# Patient Record
Sex: Male | Born: 1945 | Race: Black or African American | Hispanic: No | Marital: Married | Smoking: Never smoker
Health system: Southern US, Community
[De-identification: ages and names within clinical notes are randomized; demographics above are authoritative.]

## PROBLEM LIST (undated history)

## (undated) DIAGNOSIS — G4733 Obstructive sleep apnea (adult) (pediatric): Secondary | ICD-10-CM

## (undated) DIAGNOSIS — M109 Gout, unspecified: Secondary | ICD-10-CM

## (undated) DIAGNOSIS — E789 Disorder of lipoprotein metabolism, unspecified: Secondary | ICD-10-CM

## (undated) DIAGNOSIS — M1A00X1 Idiopathic chronic gout, unspecified site, with tophus (tophi): Secondary | ICD-10-CM

## (undated) DIAGNOSIS — M1990 Unspecified osteoarthritis, unspecified site: Secondary | ICD-10-CM

## (undated) DIAGNOSIS — G4730 Sleep apnea, unspecified: Secondary | ICD-10-CM

## (undated) DIAGNOSIS — J45909 Unspecified asthma, uncomplicated: Secondary | ICD-10-CM

## (undated) DIAGNOSIS — N259 Disorder resulting from impaired renal tubular function, unspecified: Secondary | ICD-10-CM

## (undated) DIAGNOSIS — I639 Cerebral infarction, unspecified: Secondary | ICD-10-CM

## (undated) DIAGNOSIS — K219 Gastro-esophageal reflux disease without esophagitis: Secondary | ICD-10-CM

## (undated) DIAGNOSIS — N419 Inflammatory disease of prostate, unspecified: Secondary | ICD-10-CM

## (undated) DIAGNOSIS — I10 Essential (primary) hypertension: Secondary | ICD-10-CM

## (undated) DIAGNOSIS — E119 Type 2 diabetes mellitus without complications: Secondary | ICD-10-CM

## (undated) DIAGNOSIS — M722 Plantar fascial fibromatosis: Secondary | ICD-10-CM

## (undated) DIAGNOSIS — R350 Frequency of micturition: Secondary | ICD-10-CM

## (undated) DIAGNOSIS — E785 Hyperlipidemia, unspecified: Secondary | ICD-10-CM

## (undated) HISTORY — DX: Sleep apnea, unspecified: G47.30

## (undated) HISTORY — PX: TONSILLECTOMY: SUR1361

## (undated) HISTORY — PX: UPPER GASTROINTESTINAL ENDOSCOPY: SHX188

## (undated) HISTORY — DX: Plantar fascial fibromatosis: M72.2

## (undated) HISTORY — DX: Idiopathic chronic gout, unspecified site, with tophus (tophi): M1A.00X1

## (undated) HISTORY — DX: Frequency of micturition: R35.0

## (undated) HISTORY — PX: CYST EXCISION: SHX5701

## (undated) HISTORY — DX: Gastro-esophageal reflux disease without esophagitis: K21.9

## (undated) HISTORY — DX: Hyperlipidemia, unspecified: E78.5

## (undated) HISTORY — PX: COLONOSCOPY: SHX174

## (undated) HISTORY — PX: WRIST SURGERY: SHX841

## (undated) HISTORY — DX: Cerebral infarction, unspecified: I63.9

## (undated) HISTORY — DX: Essential (primary) hypertension: I10

## (undated) HISTORY — DX: Disorder of lipoprotein metabolism, unspecified: E78.9

## (undated) HISTORY — DX: Inflammatory disease of prostate, unspecified: N41.9

## (undated) HISTORY — DX: Unspecified osteoarthritis, unspecified site: M19.90

## (undated) HISTORY — DX: Disorder resulting from impaired renal tubular function, unspecified: N25.9

---

## 1998-08-26 ENCOUNTER — Encounter: Payer: Self-pay | Admitting: Emergency Medicine

## 1998-08-26 ENCOUNTER — Emergency Department (HOSPITAL_COMMUNITY): Admission: EM | Admit: 1998-08-26 | Discharge: 1998-08-26 | Payer: Self-pay | Admitting: Emergency Medicine

## 1999-08-18 ENCOUNTER — Inpatient Hospital Stay (HOSPITAL_COMMUNITY): Admission: EM | Admit: 1999-08-18 | Discharge: 1999-08-29 | Payer: Self-pay | Admitting: Emergency Medicine

## 1999-08-18 ENCOUNTER — Encounter: Payer: Self-pay | Admitting: Internal Medicine

## 1999-08-19 ENCOUNTER — Encounter: Payer: Self-pay | Admitting: Internal Medicine

## 1999-08-22 ENCOUNTER — Encounter: Payer: Self-pay | Admitting: *Deleted

## 1999-09-16 ENCOUNTER — Encounter: Admission: RE | Admit: 1999-09-16 | Discharge: 1999-09-26 | Payer: Self-pay | Admitting: Internal Medicine

## 2004-12-31 ENCOUNTER — Ambulatory Visit: Payer: Self-pay | Admitting: Internal Medicine

## 2005-03-06 ENCOUNTER — Ambulatory Visit: Payer: Self-pay | Admitting: Internal Medicine

## 2005-07-15 ENCOUNTER — Ambulatory Visit: Payer: Self-pay | Admitting: Internal Medicine

## 2005-07-21 ENCOUNTER — Ambulatory Visit: Payer: Self-pay | Admitting: Internal Medicine

## 2005-09-22 ENCOUNTER — Ambulatory Visit: Payer: Self-pay | Admitting: Internal Medicine

## 2005-12-04 ENCOUNTER — Ambulatory Visit: Payer: Self-pay | Admitting: Internal Medicine

## 2006-04-10 ENCOUNTER — Ambulatory Visit: Payer: Self-pay | Admitting: Internal Medicine

## 2006-06-25 ENCOUNTER — Ambulatory Visit: Payer: Self-pay | Admitting: Internal Medicine

## 2006-08-19 ENCOUNTER — Ambulatory Visit: Payer: Self-pay | Admitting: Internal Medicine

## 2006-11-02 ENCOUNTER — Ambulatory Visit: Payer: Self-pay | Admitting: Internal Medicine

## 2006-11-02 LAB — CONVERTED CEMR LAB
Albumin: 3.9 g/dL (ref 3.5–5.2)
Alkaline Phosphatase: 71 units/L (ref 39–117)
BUN: 14 mg/dL (ref 6–23)
CO2: 28 meq/L (ref 19–32)
Calcium: 9.1 mg/dL (ref 8.4–10.5)
Creatinine, Ser: 1.6 mg/dL — ABNORMAL HIGH (ref 0.4–1.5)
GFR calc non Af Amer: 47 mL/min
Glucose, Bld: 80 mg/dL (ref 70–99)
HCT: 42.7 % (ref 39.0–52.0)
Hemoglobin: 14.5 g/dL (ref 13.0–17.0)
Lymphocytes Relative: 30.4 % (ref 12.0–46.0)
MCHC: 33.9 g/dL (ref 30.0–36.0)
Monocytes Absolute: 0.8 10*3/uL — ABNORMAL HIGH (ref 0.2–0.7)
Monocytes Relative: 14.4 % — ABNORMAL HIGH (ref 3.0–11.0)
Neutro Abs: 2.9 10*3/uL (ref 1.4–7.7)
Neutrophils Relative %: 50.8 % (ref 43.0–77.0)
RDW: 12.3 % (ref 11.5–14.6)
Total Bilirubin: 0.8 mg/dL (ref 0.3–1.2)
Total Protein: 7 g/dL (ref 6.0–8.3)
Uric Acid, Serum: 9.1 mg/dL — ABNORMAL HIGH (ref 2.4–7.0)

## 2006-12-18 ENCOUNTER — Ambulatory Visit: Payer: Self-pay | Admitting: Internal Medicine

## 2006-12-29 ENCOUNTER — Ambulatory Visit: Payer: Self-pay | Admitting: Gastroenterology

## 2007-01-05 ENCOUNTER — Ambulatory Visit: Payer: Self-pay | Admitting: Gastroenterology

## 2007-02-09 ENCOUNTER — Ambulatory Visit: Payer: Self-pay | Admitting: Internal Medicine

## 2007-04-16 ENCOUNTER — Ambulatory Visit: Payer: Self-pay | Admitting: Internal Medicine

## 2007-06-01 ENCOUNTER — Ambulatory Visit: Payer: Self-pay | Admitting: Internal Medicine

## 2007-07-26 DIAGNOSIS — N259 Disorder resulting from impaired renal tubular function, unspecified: Secondary | ICD-10-CM

## 2007-07-26 DIAGNOSIS — M109 Gout, unspecified: Secondary | ICD-10-CM

## 2007-07-26 DIAGNOSIS — I10 Essential (primary) hypertension: Secondary | ICD-10-CM

## 2007-07-26 HISTORY — DX: Essential (primary) hypertension: I10

## 2007-07-26 HISTORY — DX: Disorder resulting from impaired renal tubular function, unspecified: N25.9

## 2007-07-26 HISTORY — DX: Gout, unspecified: M10.9

## 2007-07-27 ENCOUNTER — Telehealth: Payer: Self-pay | Admitting: Internal Medicine

## 2007-08-06 ENCOUNTER — Ambulatory Visit: Payer: Self-pay | Admitting: Internal Medicine

## 2007-08-06 LAB — CONVERTED CEMR LAB
Calcium: 9 mg/dL (ref 8.4–10.5)
Chloride: 109 meq/L (ref 96–112)
Creatinine, Ser: 1.7 mg/dL — ABNORMAL HIGH (ref 0.4–1.5)
GFR calc non Af Amer: 44 mL/min
Uric Acid, Serum: 10.4 mg/dL — ABNORMAL HIGH (ref 2.4–7.0)

## 2007-09-28 ENCOUNTER — Ambulatory Visit: Payer: Self-pay | Admitting: Internal Medicine

## 2007-09-28 DIAGNOSIS — M1990 Unspecified osteoarthritis, unspecified site: Secondary | ICD-10-CM

## 2007-09-28 HISTORY — DX: Unspecified osteoarthritis, unspecified site: M19.90

## 2007-09-28 LAB — CONVERTED CEMR LAB
BUN: 18 mg/dL (ref 6–23)
Calcium: 9.4 mg/dL (ref 8.4–10.5)
Chloride: 107 meq/L (ref 96–112)
Creatinine, Ser: 1.6 mg/dL — ABNORMAL HIGH (ref 0.4–1.5)
GFR calc non Af Amer: 47 mL/min
Uric Acid, Serum: 9.5 mg/dL — ABNORMAL HIGH (ref 2.4–7.0)

## 2007-10-19 ENCOUNTER — Telehealth: Payer: Self-pay | Admitting: Internal Medicine

## 2008-03-06 ENCOUNTER — Telehealth: Payer: Self-pay | Admitting: Internal Medicine

## 2008-03-07 ENCOUNTER — Telehealth: Payer: Self-pay | Admitting: Internal Medicine

## 2008-03-10 ENCOUNTER — Ambulatory Visit: Payer: Self-pay | Admitting: Internal Medicine

## 2008-03-10 DIAGNOSIS — R350 Frequency of micturition: Secondary | ICD-10-CM

## 2008-03-10 HISTORY — DX: Frequency of micturition: R35.0

## 2008-03-10 LAB — CONVERTED CEMR LAB
CO2: 24 meq/L (ref 19–32)
Chloride: 105 meq/L (ref 96–112)
Creatinine, Ser: 1.51 mg/dL — ABNORMAL HIGH (ref 0.40–1.50)
Potassium: 4 meq/L (ref 3.5–5.3)
Sodium: 141 meq/L (ref 135–145)

## 2008-03-16 ENCOUNTER — Ambulatory Visit: Payer: Self-pay | Admitting: Internal Medicine

## 2008-03-16 ENCOUNTER — Telehealth (INDEPENDENT_AMBULATORY_CARE_PROVIDER_SITE_OTHER): Payer: Self-pay | Admitting: *Deleted

## 2008-04-04 ENCOUNTER — Telehealth: Payer: Self-pay | Admitting: Internal Medicine

## 2008-04-18 ENCOUNTER — Ambulatory Visit: Payer: Self-pay | Admitting: Internal Medicine

## 2008-04-18 LAB — CONVERTED CEMR LAB: Uric Acid, Serum: 10.1 mg/dL — ABNORMAL HIGH (ref 4.0–7.8)

## 2008-10-02 ENCOUNTER — Ambulatory Visit: Payer: Self-pay | Admitting: Internal Medicine

## 2008-10-02 LAB — CONVERTED CEMR LAB
CO2: 27 meq/L (ref 19–32)
Calcium: 9 mg/dL (ref 8.4–10.5)
Chloride: 108 meq/L (ref 96–112)
Potassium: 4.1 meq/L (ref 3.5–5.1)
Sodium: 143 meq/L (ref 135–145)
Uric Acid, Serum: 10.4 mg/dL — ABNORMAL HIGH (ref 4.0–7.8)

## 2008-11-02 ENCOUNTER — Ambulatory Visit: Payer: Self-pay | Admitting: Internal Medicine

## 2009-02-19 ENCOUNTER — Ambulatory Visit: Payer: Self-pay | Admitting: Internal Medicine

## 2009-02-19 LAB — CONVERTED CEMR LAB
CO2: 27 meq/L (ref 19–32)
Calcium: 9.2 mg/dL (ref 8.4–10.5)
Potassium: 4.1 meq/L (ref 3.5–5.1)
Sodium: 141 meq/L (ref 135–145)
Uric Acid, Serum: 9.6 mg/dL — ABNORMAL HIGH (ref 4.0–7.8)

## 2009-03-02 ENCOUNTER — Telehealth: Payer: Self-pay | Admitting: Internal Medicine

## 2009-03-14 ENCOUNTER — Telehealth: Payer: Self-pay | Admitting: Internal Medicine

## 2009-03-21 ENCOUNTER — Telehealth: Payer: Self-pay | Admitting: Internal Medicine

## 2009-03-28 ENCOUNTER — Ambulatory Visit: Payer: Self-pay | Admitting: Internal Medicine

## 2009-03-28 LAB — CONVERTED CEMR LAB
CO2: 27 meq/L (ref 19–32)
Calcium: 9 mg/dL (ref 8.4–10.5)
GFR calc non Af Amer: 46.67 mL/min (ref >60)
Potassium: 4.3 meq/L (ref 3.5–5.1)
Sodium: 141 meq/L (ref 135–145)

## 2009-05-01 ENCOUNTER — Ambulatory Visit: Payer: Self-pay | Admitting: Internal Medicine

## 2009-11-07 ENCOUNTER — Telehealth (INDEPENDENT_AMBULATORY_CARE_PROVIDER_SITE_OTHER): Payer: Self-pay | Admitting: *Deleted

## 2009-11-08 ENCOUNTER — Ambulatory Visit: Payer: Self-pay | Admitting: Internal Medicine

## 2009-11-08 DIAGNOSIS — E789 Disorder of lipoprotein metabolism, unspecified: Secondary | ICD-10-CM | POA: Insufficient documentation

## 2009-11-08 DIAGNOSIS — M1A00X1 Idiopathic chronic gout, unspecified site, with tophus (tophi): Secondary | ICD-10-CM

## 2009-11-08 DIAGNOSIS — N419 Inflammatory disease of prostate, unspecified: Secondary | ICD-10-CM

## 2009-11-08 HISTORY — DX: Idiopathic chronic gout, unspecified site, with tophus (tophi): M1A.00X1

## 2009-11-08 HISTORY — DX: Disorder of lipoprotein metabolism, unspecified: E78.9

## 2009-11-08 HISTORY — DX: Inflammatory disease of prostate, unspecified: N41.9

## 2009-11-08 LAB — CONVERTED CEMR LAB
Chloride: 106 meq/L (ref 96–112)
Cholesterol: 184 mg/dL (ref 0–200)
GFR calc non Af Amer: 49.57 mL/min (ref >60)
Glucose, Bld: 109 mg/dL — ABNORMAL HIGH (ref 70–99)
Potassium: 4.3 meq/L (ref 3.5–5.1)
Sodium: 143 meq/L (ref 135–145)
Triglycerides: 142 mg/dL (ref 0.0–149.0)
Uric Acid, Serum: 11.4 mg/dL — ABNORMAL HIGH (ref 4.0–7.8)

## 2009-11-21 LAB — CONVERTED CEMR LAB: PSA, Free Pct: 34 (ref >25)

## 2010-01-16 ENCOUNTER — Ambulatory Visit: Payer: Self-pay | Admitting: Internal Medicine

## 2010-01-16 DIAGNOSIS — E785 Hyperlipidemia, unspecified: Secondary | ICD-10-CM

## 2010-01-16 HISTORY — DX: Hyperlipidemia, unspecified: E78.5

## 2010-01-16 LAB — CONVERTED CEMR LAB
Cholesterol, target level: 200 mg/dL
HDL goal, serum: 40 mg/dL

## 2010-04-05 ENCOUNTER — Ambulatory Visit: Payer: Self-pay | Admitting: Internal Medicine

## 2010-04-05 LAB — CONVERTED CEMR LAB
Calcium: 9.1 mg/dL (ref 8.4–10.5)
GFR calc non Af Amer: 39.27 mL/min (ref >60)
PSA: 1.17 ng/mL (ref 0.10–4.00)
Sodium: 144 meq/L (ref 135–145)
Uric Acid, Serum: 11.4 mg/dL — ABNORMAL HIGH (ref 4.0–7.8)

## 2010-04-19 ENCOUNTER — Ambulatory Visit: Payer: Self-pay | Admitting: Internal Medicine

## 2010-04-19 DIAGNOSIS — M722 Plantar fascial fibromatosis: Secondary | ICD-10-CM

## 2010-04-19 HISTORY — DX: Plantar fascial fibromatosis: M72.2

## 2010-09-18 ENCOUNTER — Ambulatory Visit: Payer: Self-pay | Admitting: Family Medicine

## 2010-10-15 ENCOUNTER — Encounter: Payer: Self-pay | Admitting: Internal Medicine

## 2010-12-11 ENCOUNTER — Telehealth: Payer: Self-pay | Admitting: Internal Medicine

## 2011-01-21 NOTE — Assessment & Plan Note (Signed)
Summary: fup//ccm/pt rsc/cjr   Vital Signs:  Patient profile:   65 year old male Height:      70 inches Weight:      239 pounds BMI:     34.42 Temp:     98.7 degrees F oral Pulse rate:   76 / minute Resp:     14 per minute BP sitting:   130 / 82  (left arm)  Vitals Entered By: Allyne Gee, LPN (January 26, 624THL 9:28 AM)  Nutrition Counseling: Patient's BMI is greater than 25 and therefore counseled on weight management options. CC: roa, Hypertension Management, Lipid Management   CC:  roa, Hypertension Management, and Lipid Management.  History of Present Illness: The pt had two smaller attacks of gout follow up blood pressure, recheck medications still having sexual functioning issues and the cialis 20 worked best discusson of diet and gout GERD with reflux has been stable on the PPI  Hypertension History:      He denies headache, chest pain, palpitations, dyspnea with exertion, orthopnea, PND, peripheral edema, visual symptoms, neurologic problems, syncope, and side effects from treatment.        Positive major cardiovascular risk factors include male age 50 years old or older, hyperlipidemia, and hypertension.  Negative major cardiovascular risk factors include no history of diabetes and non-tobacco-user status.        Positive history for target organ damage include renal insufficiency.  Further assessment for target organ damage reveals no history of ASHD, cardiac end-organ damage (CHF/LVH), stroke/TIA, or peripheral vascular disease.    Lipid Management History:      Positive NCEP/ATP III risk factors include male age 73 years old or older, HDL cholesterol less than 40, and hypertension.  Negative NCEP/ATP III risk factors include non-diabetic, non-tobacco-user status, no ASHD (atherosclerotic heart disease), no prior stroke/TIA, no peripheral vascular disease, and no history of aortic aneurysm.      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 1 Three Times A Day 2)  Labetalol Hcl 200 Mg Tabs (Labetalol Hcl) .... Two Times A Day 3)  Pantoprazole Sodium 40 Mg Tbec (Pantoprazole Sodium) .... Once Daily 4)  Valium 5 Mg  Tabs (Diazepam) .... Two Times A Day 5)  Levitra 10 Mg  Tabs (Vardenafil Hcl) .... Take 1 Tablet By Mouth Once A Day 6)  Azor 10-40 Mg  Tabs (Amlodipine-Olmesartan) .... One Daily 7)  Lisinopril 40 Mg Tabs (Lisinopril) .Marland Kitchen.. 1 By Mouth Once Daily 8)  Cialis 5 Mg Tabs (Tadalafil) .... One By Mouth Daily 9)  Colchicine 0.6 Mg Tabs (Colchicine) .Marland Kitchen.. 1 Two Times A Day As Needed Gout Flare Up 10)  Allopurinol 300 Mg Tabs (Allopurinol) .... One Every Bedtime. 11)  Tricor 145 Mg Tabs (Fenofibrate) .... One By Mouth Daily  Current Medications (verified): 1)  Indomethacin 50 Mg Caps (Indomethacin) .Marland Kitchen.. 1 Three Times A Day 2)  Labetalol Hcl 200 Mg Tabs (Labetalol Hcl) .... Two Times A Day 3)  Pantoprazole Sodium 40 Mg Tbec (Pantoprazole Sodium) .... Once Daily 4)  Valium 5 Mg  Tabs (Diazepam) .... Two Times A Day 5)  Levitra 10 Mg  Tabs (Vardenafil Hcl) .... Take 1 Tablet By Mouth Once A Day 6)  Azor 10-40 Mg  Tabs (Amlodipine-Olmesartan) .... One Daily 7)  Lisinopril 40 Mg Tabs (Lisinopril) .Marland Kitchen.. 1 By Mouth Once Daily 8)  Cialis 5 Mg Tabs (Tadalafil) .... One By Mouth Daily 9)  Colchicine 0.6 Mg Tabs (Colchicine) .Marland Kitchen.. 1 Two Times A Day As Needed Gout Flare Up 10)  Allopurinol 300 Mg Tabs (Allopurinol) .... One Every Bedtime. 11)  Tricor 145 Mg Tabs (Fenofibrate) .... One By  Mouth Daily  Allergies (verified): 1)  ! Uloric (Febuxostat)  Past History:  Social History: Last updated: 07/26/2007 Never Smoked Married Alcohol use-no  Risk Factors: Smoking Status: never (01/16/2010)  Past medical, surgical, family and social histories (including risk factors) reviewed, and no changes noted (except as noted below).  Past Medical History: Reviewed history from 09/28/2007 and no changes required. Gout Hypertension Back pain Renal insufficiency Osteoarthritis  Past Surgical History: Reviewed history from 08/06/2007 and no changes required. EDG-01/05/2007 Had surgery to right arm for gout  Family History: Reviewed history and no changes required.  Social History: Reviewed history from 07/26/2007 and no changes required. Never Smoked Married Alcohol use-no  Review of Systems  The patient denies anorexia, fever, weight loss, weight gain, vision loss, decreased hearing, hoarseness, chest pain, syncope, dyspnea on exertion, peripheral edema, prolonged cough, headaches, hemoptysis, abdominal pain, melena, hematochezia, severe indigestion/heartburn, hematuria, incontinence, genital sores, muscle weakness, suspicious skin lesions, transient blindness, difficulty walking, depression, unusual weight change, abnormal bleeding, enlarged lymph nodes, angioedema, and breast masses.    Physical Exam  General:  Well-developed,well-nourished,in no acute distress; alert,appropriate and cooperative throughout examination Head:  normocephalic and atraumatic.   Eyes:  pupils equal and pupils round.   Ears:  R ear normal and L ear normal.   Nose:  no external deformity and no nasal discharge.   Neck:  supple and no masses.   Lungs:  normal respiratory effort.   Heart:  normal rate and regular rhythm.   Abdomen:  soft and non-tender.   Msk:  joint tenderness and joint swelling.   Extremities:  trace left pedal edema and trace right pedal edema.   Neurologic:  alert  & oriented X3 and gait normal.     Impression & Recommendations:  Problem # 1:  RENAL INSUFFICIENCY (ICD-588.9) due to htn and gout creat 1.8  Problem # 2:  CHRONIC GOUTY ARTHROPATHY WITH TOPHUS (ICD-274.03) monitering uric acid levels, could not tolerate uloric His updated medication list for this problem includes:    Colchicine 0.6 Mg Tabs (Colchicine) .Marland Kitchen... 1 two times a day as needed gout flare up    Allopurinol 300 Mg Tabs (Allopurinol) ..... One every bedtime.  Elevate extremity; warm compresses, symptomatic relief and medication as directed.   Problem # 3:  HYPERTENSION (ICD-401.9)  His updated medication list for this problem includes:    Labetalol Hcl 200 Mg Tabs (Labetalol hcl) .Marland Kitchen..Marland Kitchen Two times a day    Azor 10-40 Mg Tabs (Amlodipine-olmesartan) ..... One daily    Lisinopril 40 Mg Tabs (Lisinopril) .Marland Kitchen... 1 by mouth once daily  BP today: 130/82 Prior BP: 170/90 (11/08/2009)  10 Yr Risk Heart Disease: 22 % Prior 10 Yr Risk Heart Disease: Not enough information (09/28/2007)  Labs Reviewed: K+: 4.3 (11/08/2009) Creat: : 1.8 (11/08/2009)   Chol: 184 (11/08/2009)   HDL: 30.50 (11/08/2009)   LDL: 125 (11/08/2009)   TG: 142.0 (  11/08/2009)  Problem # 4:  HYPERCHOLESTEROLEMIA, BORDERLINE (ICD-272.4) the tricor works well and the pt could not tolerate statins His updated medication list for this problem includes:    Tricor 145 Mg Tabs (Fenofibrate) ..... One by mouth daily  Labs Reviewed: SGOT: 46 (11/02/2006)   SGPT: 60 (11/02/2006)  Lipid Goals: Chol Goal: 200 (01/16/2010)   HDL Goal: 40 (01/16/2010)   LDL Goal: 100 (01/16/2010)   TG Goal: 150 (01/16/2010)  10 Yr Risk Heart Disease: 22 % Prior 10 Yr Risk Heart Disease: Not enough information (09/28/2007)   HDL:30.50 (11/08/2009)  LDL:125 (11/08/2009)  Chol:184 (11/08/2009)  Trig:142.0 (11/08/2009)  Complete Medication List: 1)  Indomethacin 50 Mg Caps (Indomethacin) .Marland Kitchen.. 1 three times a day 2)  Labetalol Hcl 200 Mg  Tabs (Labetalol hcl) .... Two times a day 3)  Pantoprazole Sodium 40 Mg Tbec (Pantoprazole sodium) .... Once daily 4)  Valium 5 Mg Tabs (Diazepam) .... Two times a day 5)  Levitra 10 Mg Tabs (Vardenafil hcl) .... Take 1 tablet by mouth once a day 6)  Azor 10-40 Mg Tabs (Amlodipine-olmesartan) .... One daily 7)  Lisinopril 40 Mg Tabs (Lisinopril) .Marland Kitchen.. 1 by mouth once daily 8)  Cialis 5 Mg Tabs (Tadalafil) .... One by mouth daily 9)  Colchicine 0.6 Mg Tabs (Colchicine) .Marland Kitchen.. 1 two times a day as needed gout flare up 10)  Allopurinol 300 Mg Tabs (Allopurinol) .... One every bedtime. 11)  Tricor 145 Mg Tabs (Fenofibrate) .... One by mouth daily  Hypertension Assessment/Plan:      The patient's hypertensive risk group is category C: Target organ damage and/or diabetes.  His calculated 10 year risk of coronary heart disease is 22 %.  Today's blood pressure is 130/82.  His blood pressure goal is < 140/90.  Lipid Assessment/Plan:      Based on NCEP/ATP III, the patient's risk factor category is "2 or more risk factors and a calculated 10 year CAD risk of > 20%".  The patient's lipid goals are as follows: Total cholesterol goal is 200; LDL cholesterol goal is 100; HDL cholesterol goal is 40; Triglyceride goal is 150.  His LDL cholesterol goal has been met.    Patient Instructions: 1)  Please schedule a follow-up appointment in 3 months. 2)  BMP prior to visit, ICD-9:  abd uric acid274.9 3)  PSA 601.0

## 2011-01-21 NOTE — Assessment & Plan Note (Signed)
Summary: 3 month rov/njr   Vital Signs:  Patient profile:   65 year old male Height:      70 inches Weight:      244 pounds BMI:     35.14 Temp:     99.2 degrees F oral Pulse rate:   80 / minute Resp:     14 per minute BP sitting:   140 / 90  Vitals Entered By: Allyne Gee, LPN (April 29, 624THL 624THL PM) CC: roa, Hypertension Management   CC:  roa and Hypertension Management.  History of Present Illness: pt has episodic gout anfd now present with severe plantar fascitis of his feet   Hypertension History:      He denies headache, chest pain, palpitations, dyspnea with exertion, orthopnea, PND, peripheral edema, visual symptoms, neurologic problems, syncope, and side effects from treatment.        Positive major cardiovascular risk factors include male age 78 years old or older, hyperlipidemia, and hypertension.  Negative major cardiovascular risk factors include no history of diabetes and non-tobacco-user status.        Positive history for target organ damage include renal insufficiency.  Further assessment for target organ damage reveals no history of ASHD, cardiac end-organ damage (CHF/LVH), stroke/TIA, or peripheral vascular disease.     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 1 Three Times A Day 2)  Labetalol Hcl 200 Mg Tabs (Labetalol Hcl) .... Two Times A Day 3)  Pantoprazole Sodium 40 Mg Tbec (Pantoprazole Sodium) .... Once Daily 4)  Valium 5 Mg  Tabs (Diazepam) .... Two Times A Day 5)  Levitra 10 Mg  Tabs (Vardenafil Hcl) .... Take 1 Tablet By Mouth Once A Day 6)  Azor 10-40 Mg  Tabs (Amlodipine-Olmesartan) .... One Daily 7)  Lisinopril 40 Mg Tabs (Lisinopril) .Marland Kitchen.. 1 By Mouth Once Daily 8)  Cialis 5 Mg Tabs (Tadalafil) .... One By Mouth Daily 9)  Colchicine 0.6 Mg Tabs (Colchicine) .Marland Kitchen.. 1 Two Times A Day As Needed Gout Flare Up 10)  Allopurinol 300 Mg Tabs (Allopurinol) .... One Every Bedtime. 11)  Tricor 145 Mg Tabs (Fenofibrate) .... One By Mouth Daily  Current Medications (verified): 1)  Indomethacin 50 Mg Caps (Indomethacin) .Marland Kitchen.. 1 Three Times A Day 2)  Labetalol Hcl 200 Mg Tabs (Labetalol Hcl) .... Two Times A Day 3)  Pantoprazole Sodium 40 Mg Tbec (Pantoprazole Sodium) .... Once Daily 4)  Valium 5 Mg  Tabs (Diazepam) .... Two Times A Day 5)  Levitra 10 Mg  Tabs (Vardenafil Hcl) .... Take 1 Tablet By Mouth Once A Day 6)  Azor 10-40 Mg  Tabs (Amlodipine-Olmesartan) .... One Daily 7)  Lisinopril 40 Mg Tabs (Lisinopril) .Marland Kitchen.. 1 By Mouth Once Daily 8)  Cialis 5 Mg Tabs (Tadalafil) .... One By Mouth Daily 9)  Colchicine 0.6 Mg Tabs (Colchicine) .Marland Kitchen.. 1 Two Times A Day As Needed Gout Flare Up 10)  Allopurinol 300 Mg Tabs (Allopurinol) .... One Every Bedtime. 11)  Tricor 145 Mg Tabs (Fenofibrate) .... One By Mouth Daily  Allergies (verified): 1)  ! Uloric (Febuxostat)  Past History:  Social History: Last updated: 07/26/2007 Never Smoked Married Alcohol use-no  Risk Factors: Smoking Status: never (01/16/2010)  Past medical, surgical, family and social histories (including risk factors) reviewed, and no  changes noted (except as noted below).  Past Medical History: Reviewed history from 09/28/2007 and no changes required. Gout Hypertension Back pain Renal insufficiency Osteoarthritis  Past Surgical History: Reviewed history from 08/06/2007 and no changes required. EDG-01/05/2007 Had surgery to right arm for gout  Family History: Reviewed history and no changes required.  Social History: Reviewed history from 07/26/2007 and no changes required. Never Smoked Married Alcohol use-no  Review of Systems  The patient denies anorexia, fever, weight loss, weight gain, vision loss, decreased hearing, hoarseness, chest pain, syncope, dyspnea on exertion, peripheral edema, prolonged cough, headaches, hemoptysis, abdominal pain, melena, hematochezia, severe indigestion/heartburn, hematuria, incontinence, genital sores, muscle weakness, suspicious skin lesions, transient blindness, difficulty walking, depression, unusual weight change, abnormal bleeding, enlarged lymph nodes, angioedema, and breast masses.    Physical Exam  General:  Well-developed,well-nourished,in no acute distress; alert,appropriate and cooperative throughout examination Head:  normocephalic and atraumatic.   Eyes:  pupils equal and pupils round.   Ears:  R ear normal and L ear normal.   Nose:  no external deformity and no nasal discharge.   Neck:  supple and no masses.   Lungs:  normal respiratory effort.   Heart:  normal rate and regular rhythm.   Abdomen:  soft and non-tender.   Msk:  joint tenderness and joint swelling.   Pulses:  R and L carotid,radial,femoral,dorsalis pedis and posterior tibial pulses are full and equal bilaterally Extremities:  trace left pedal edema and trace right pedal edema.   Neurologic:  alert & oriented X3 and gait normal.     Impression & Recommendations:  Problem # 1:  CHRONIC GOUTY ARTHROPATHY WITH TOPHUS (ICD-274.03) failedthe uloric His updated medication list for this problem  includes:    Colchicine 0.6 Mg Tabs (Colchicine) .Marland Kitchen... 1 two times a day as needed gout flare up    Allopurinol 300 Mg Tabs (Allopurinol) ..... One every bedtime.  Elevate extremity; warm compresses, symptomatic relief and medication as directed.   Problem # 2:  PLANTAR FASCIITIS, BILATERAL (ICD-728.71)  His updated medication list for this problem includes:    Indomethacin 50 Mg Caps (Indomethacin) .Marland Kitchen... 1 three times a day  Discussed use of gel inserts, ice massage, and stretching exercises.   Problem # 3:  HYPERTENSION (ICD-401.9)  His updated medication list for this problem includes:    Labetalol Hcl 200 Mg Tabs (Labetalol hcl) .Marland Kitchen..Marland Kitchen Two times a day    Azor 10-40 Mg Tabs (Amlodipine-olmesartan) ..... One daily    Lisinopril 40 Mg Tabs (Lisinopril) .Marland Kitchen... 1 by mouth once daily  BP today: 140/90 Prior BP: 130/82 (01/16/2010)  10 Yr Risk Heart Disease: 27 % Prior 10 Yr Risk Heart Disease: 22 % (01/16/2010)  Labs Reviewed: K+: 4.3 (04/05/2010) Creat: : 2.2 (04/05/2010)   Chol: 184 (11/08/2009)   HDL: 30.50 (11/08/2009)   LDL: 125 (11/08/2009)   TG: 142.0 (11/08/2009)  Problem # 4:  RENAL INSUFFICIENCY (ICD-588.9) moniter B met  Complete Medication List: 1)  Indomethacin 50 Mg Caps (Indomethacin) .Marland Kitchen.. 1 three times a day 2)  Labetalol Hcl 200 Mg Tabs (Labetalol hcl) .... Two times a day 3)  Pantoprazole Sodium 40 Mg Tbec (Pantoprazole sodium) .... Once daily 4)  Valium 5 Mg Tabs (Diazepam) .... Two times a day 5)  Levitra 10 Mg Tabs (Vardenafil hcl) .... Take 1 tablet by mouth once a day 6)  Azor 10-40 Mg Tabs (Amlodipine-olmesartan) .... One daily 7)  Lisinopril 40 Mg Tabs (Lisinopril) .Marland Kitchen.. 1 by mouth once daily 8)  Cialis 5 Mg Tabs (Tadalafil) .... One by mouth daily 9)  Colchicine 0.6 Mg Tabs (Colchicine) .Marland Kitchen.. 1 two times a day as needed gout flare up 10)  Allopurinol 300 Mg Tabs (Allopurinol) .... One every bedtime. 11)  Tricor 145 Mg Tabs (Fenofibrate) .... One by  mouth daily 12)  Methylprednisolone (pak) 4 Mg Tabs (Methylprednisolone) .... Take as directed 5 day pack  Hypertension Assessment/Plan:      The patient's hypertensive risk group is category C: Target organ damage and/or diabetes.  His calculated 10 year risk of coronary heart disease is 27 %.  Today's blood pressure is 140/90.  His blood pressure goal is < 140/90.  Patient Instructions: 1)  Please schedule a follow-up appointment in 3 months. Prescriptions: METHYLPREDNISOLONE (PAK) 4 MG TABS (METHYLPREDNISOLONE) take as directed 5 day pack  #1 pack x 0   Entered and Authorized by:   Ricard Dillon MD   Signed by:   Ricard Dillon MD on 04/19/2010   Method used:   Electronically to        [facility]. [phone]* (retail)       [address]       [place], [place]  [address]       Ph: [phone] or [phone]       Fax: [phone]   RxID:   [id number]

## 2011-01-21 NOTE — Consult Note (Signed)
[facility]   Imported By: Laural Benes 10/21/2010 10:25:39  _____________________________________________________________________  External Attachment:    Type:   Image     Comment:   External Document

## 2011-01-21 NOTE — Assessment & Plan Note (Signed)
Summary: DIZZINESS//SLM   Vital Signs:  Patient profile:   65 year old male Weight:      235 pounds Temp:     98.3 degrees F oral BP sitting:   160 / 98  (left arm) Cuff size:   large  Vitals Entered By: Nira Conn LPN (September 28, 624THL 9:46 AM)  History of Present Illness: Patient seen acute visit chief complaint of dizziness.  Onset 2 days ago. First noticed when getting out of bed and moving to the right side. Dizziness is vertigo moderate to severe at times. Slight nausea but no vomiting. Somewhat better throughout the day. No hearing changes, headaches, nasal congestion, focal weakness, or speech changes. Symptoms exacerbated with movement to the right. History of similar episode about 5 years ago.  History of hypertension treated with 3 drug regimen. Compliant with all medications.  Allergies: 1)  ! Uloric (Febuxostat)  Past History:  Past Medical History: Last updated: 09/28/2007 Gout Hypertension Back pain Renal insufficiency Osteoarthritis PMH reviewed for relevance  Review of Systems  The patient denies anorexia, fever, weight loss, chest pain, and headaches.    Physical Exam  General:  Well-developed,well-nourished,in no acute distress; alert,appropriate and cooperative throughout examination Head:  Normocephalic and atraumatic without obvious abnormalities. No apparent alopecia or balding. Eyes:  pupils equal, pupils round, and pupils reactive to light.   Ears:  External ear exam shows no significant lesions or deformities.  Otoscopic examination reveals clear canals, tympanic membranes are intact bilaterally without bulging, retraction, inflammation or discharge. Hearing is grossly normal bilaterally. Nose:  External nasal examination shows no deformity or inflammation. Nasal mucosa are pink and moist without lesions or exudates. Mouth:  Oral mucosa and oropharynx without lesions or exudates.  Teeth in good repair. Neck:  No deformities, masses, or  tenderness noted. Lungs:  Normal respiratory effort, chest expands symmetrically. Lungs are clear to auscultation, no crackles or wheezes. Heart:  normal rate and regular rhythm.   Neurologic:  patient has no nystagmus. He has vertigo symptoms which are triggered with movement of head to the right side but not the leftalert & oriented X3, cranial nerves II-XII intact, strength normal in all extremities, sensation intact to light touch, and finger-to-nose normal.   Cervical Nodes:  No lymphadenopathy noted Psych:  normally interactive, good eye contact, not anxious appearing, and not depressed appearing.     Impression & Recommendations:  Problem # 1:  BENIGN POSITIONAL VERTIGO (ICD-386.11) Assessment New meclizine for symptom relief.  Consider referral for vestibular rrehab if symptoms persist. His updated medication list for this problem includes:    Meclizine Hcl 25 Mg Tabs (Meclizine hcl) ..... One by mouth q 8 hours as needed vertigo  Problem # 2:  HYPERTENSION (ICD-401.9) pt will monitor. His updated medication list for this problem includes:    Labetalol Hcl 200 Mg Tabs (Labetalol hcl) .Marland Kitchen..Marland Kitchen Two times a day    Azor 10-40 Mg Tabs (Amlodipine-olmesartan) ..... One daily    Lisinopril 40 Mg Tabs (Lisinopril) .Marland Kitchen... 1 by mouth once daily  Complete Medication List: 1)  Indomethacin 50 Mg Caps (Indomethacin) .Marland Kitchen.. 1 three times a day 2)  Labetalol Hcl 200 Mg Tabs (Labetalol hcl) .... Two times a day 3)  Pantoprazole Sodium 40 Mg Tbec (Pantoprazole sodium) .... Once daily 4)  Valium 5 Mg Tabs (Diazepam) .... Two times a day 5)  Levitra 10 Mg Tabs (Vardenafil hcl) .... Take 1 tablet by mouth once a day 6)  Azor 10-40 Mg Tabs (Amlodipine-olmesartan) .Marland KitchenMarland KitchenMarland Kitchen  One daily 7)  Lisinopril 40 Mg Tabs (Lisinopril) .Marland Kitchen.. 1 by mouth once daily 8)  Cialis 5 Mg Tabs (Tadalafil) .... One by mouth daily 9)  Colchicine 0.6 Mg Tabs (Colchicine) .Marland Kitchen.. 1 two times a day as needed gout flare up 10)  Allopurinol  300 Mg Tabs (Allopurinol) .... One every bedtime. 11)  Tricor 145 Mg Tabs (Fenofibrate) .... One by mouth daily 12)  Methylprednisolone (pak) 4 Mg Tabs (Methylprednisolone) .... Take as directed 5 day pack 13)  Meclizine Hcl 25 Mg Tabs (Meclizine hcl) .... One by mouth q 8 hours as needed vertigo  Patient Instructions: 1)  Be in touch if symptoms not clearing over the next 1-2 weeks. 2)  Avoid driving until symptoms further cleared. Prescriptions: MECLIZINE HCL 25 MG TABS (MECLIZINE HCL) one by mouth q 8 hours as needed vertigo  #30 x 0   Entered and Authorized by:   Carolann Littler MD   Signed by:   Carolann Littler MD on 09/18/2010   Method used:   Electronically to        [facility]. [phone]* (retail)       [address]       [place], [place]  [address]       Ph: [phone] or [phone]       Fax: [phone]   RxID:   [id number]

## 2011-01-21 NOTE — Letter (Signed)
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         Sincerely,    Allyne Gee, LPN

## 2011-01-23 NOTE — Progress Notes (Signed)
  Phone Note Call from Patient   Caller: Patient Call For: Ricard Dillon MD Summary of Call: Pt states both of knees are painful and swelling and cannot walk or drive. [facility] Initial call taken by: Deanna Artis CMA AAMA,  December 11, 2010 2:37 PM  Follow-up for Phone Call        per dr Arnoldo Morale- may have prednisone 10 mg 12 day dose pack Follow-up by: Allyne Gee, LPN,  December 21, 624THL 3:17 PM    New/Updated Medications: PREDNISONE (PAK) 10 MG TABS (PREDNISONE) as directed Prescriptions: PREDNISONE (PAK) 10 MG TABS (PREDNISONE) as directed  #1 pack x 0   Entered by:   Deanna Artis CMA AAMA   Authorized by:   Ricard Dillon MD   Signed by:   Deanna Artis CMA AAMA on 12/11/2010   Method used:   Electronically to        [facility] [phone]* (retail)       [address], [place]  [address]       Ph: [phone] or [phone]       Fax: [phone]   RxID:   [id number]  Notified pt.

## 2011-02-04 ENCOUNTER — Other Ambulatory Visit: Payer: BC Managed Care – PPO | Admitting: Internal Medicine

## 2011-02-04 DIAGNOSIS — Z0000 Encounter for general adult medical examination without abnormal findings: Secondary | ICD-10-CM

## 2011-02-04 LAB — PSA: PSA: 1.41 ng/mL (ref 0.10–4.00)

## 2011-02-04 LAB — CBC WITH DIFFERENTIAL/PLATELET
Basophils Absolute: 0 10*3/uL (ref 0.0–0.1)
Hemoglobin: 13.2 g/dL (ref 13.0–17.0)
Lymphocytes Relative: 32 % (ref 12.0–46.0)
Monocytes Relative: 10.3 % (ref 3.0–12.0)
Neutro Abs: 3.2 10*3/uL (ref 1.4–7.7)
RBC: 4.39 Mil/uL (ref 4.22–5.81)
RDW: 14.1 % (ref 11.5–14.6)

## 2011-02-04 LAB — BASIC METABOLIC PANEL
Calcium: 9.3 mg/dL (ref 8.4–10.5)
GFR: 39.37 mL/min — ABNORMAL LOW (ref >60.00)
Glucose, Bld: 80 mg/dL (ref 70–99)
Sodium: 141 mEq/L (ref 135–145)

## 2011-02-04 LAB — HEPATIC FUNCTION PANEL
ALT: 25 U/L (ref 0–53)
Albumin: 4.3 g/dL (ref 3.5–5.2)
Total Bilirubin: 0.8 mg/dL (ref 0.3–1.2)
Total Protein: 7.4 g/dL (ref 6.0–8.3)

## 2011-02-04 LAB — LIPID PANEL
HDL: 29.5 mg/dL — ABNORMAL LOW (ref >39.00)
VLDL: 31.4 mg/dL (ref 0.0–40.0)

## 2011-02-04 LAB — TSH: TSH: 1.06 u[IU]/mL (ref 0.35–5.50)

## 2011-02-06 ENCOUNTER — Other Ambulatory Visit: Payer: Self-pay | Admitting: Internal Medicine

## 2011-02-10 ENCOUNTER — Ambulatory Visit (INDEPENDENT_AMBULATORY_CARE_PROVIDER_SITE_OTHER): Payer: BC Managed Care – PPO | Admitting: Internal Medicine

## 2011-02-10 ENCOUNTER — Encounter: Payer: Self-pay | Admitting: Internal Medicine

## 2011-02-10 DIAGNOSIS — M109 Gout, unspecified: Secondary | ICD-10-CM

## 2011-02-12 NOTE — Progress Notes (Signed)
  Subjective:    Patient ID: Jared [name], male    DOB: 1946/09/29, 65 y.o.   MRN: [id number]  HPI    Review of Systems     Objective:   Physical Exam        Assessment & Plan:  This encounter was created in error - please disregard.

## 2011-02-18 ENCOUNTER — Ambulatory Visit (INDEPENDENT_AMBULATORY_CARE_PROVIDER_SITE_OTHER): Payer: BC Managed Care – PPO | Admitting: Internal Medicine

## 2011-02-18 VITALS — BP 162/92 | Temp 98.0°F | Ht 67.0 in | Wt 238.0 lb

## 2011-02-18 DIAGNOSIS — M25562 Pain in left knee: Secondary | ICD-10-CM

## 2011-02-18 DIAGNOSIS — M25569 Pain in unspecified knee: Secondary | ICD-10-CM

## 2011-02-18 MED ORDER — METHYLPREDNISOLONE (PAK) 4 MG PO TABS: ORAL_TABLET | ORAL | Status: AC

## 2011-02-18 MED ORDER — METHYLPREDNISOLONE ACETATE 40 MG/ML IJ SUSP: 40.0000 mg | Freq: Once | INTRAMUSCULAR | Status: DC

## 2011-02-19 ENCOUNTER — Encounter: Payer: Self-pay | Admitting: Internal Medicine

## 2011-02-19 DIAGNOSIS — M25561 Pain in right knee: Secondary | ICD-10-CM | POA: Insufficient documentation

## 2011-02-19 NOTE — Progress Notes (Signed)
  Subjective:    Patient ID: Jared [name], male    DOB: 10-11-1946, 65 y.o.   MRN: [id number]  HPI Pt presents to clinic for evaluation of knee pain. Notes chronic intermittent h/o bilateral knee pain over the last several months without injury/trauma. Has h/o gout but does not feel is related. Over last 2wks notes increased pain and bilateral swelling of knees and denies instability or falls. Reviewed h/o CRI and last Cr. No alleviating or exacerbating factors. BP elevated but currently in pain.  Reviewed PMH, medications, and allergies.    Review of Systems  Constitutional: Negative for fever, chills and fatigue.  Musculoskeletal: Positive for joint swelling and arthralgias. Negative for myalgias and back pain.       Objective:   Physical Exam  Constitutional: He appears well-developed and well-nourished. No distress.  HENT:  Head: Normocephalic and atraumatic.  Right Ear: External ear normal.  Left Ear: External ear normal.  Eyes: Conjunctivae are normal. No scleral icterus.  Musculoskeletal:       Right knee: He exhibits swelling. He exhibits normal range of motion, no effusion, no ecchymosis, no deformity, no laceration, no erythema and normal patellar mobility. tenderness found. Patellar tendon tenderness noted.       Left knee: He exhibits swelling. He exhibits normal range of motion, no effusion, no ecchymosis, no deformity, no laceration, no erythema, normal patellar mobility and no bony tenderness. tenderness found. Patellar tendon tenderness noted.  Neurological: He is alert.  Skin: Skin is warm and dry. No rash noted. He is not diaphoretic. No erythema.  Psychiatric: He has a normal mood and affect.          Assessment & Plan:

## 2011-02-19 NOTE — Assessment & Plan Note (Signed)
Avoid nsaids currently due to CRI. Attempt depomedrol IM injxn and medrol dosepak. Followup if no improvement or worsening.

## 2011-02-28 ENCOUNTER — Telehealth: Payer: Self-pay | Admitting: Internal Medicine

## 2011-02-28 NOTE — Telephone Encounter (Signed)
Triage vm-------complains of swollen knees and cannot walk. Was seen recently for this . Please advise.

## 2011-02-28 NOTE — Telephone Encounter (Signed)
10 mg 12 day prednisone dose pack per dr Earney Hamburg

## 2011-02-28 NOTE — Telephone Encounter (Signed)
Per dr Arnoldo Morale- rheumatology referral and perdnisone 10mg  12 days -will call in to [facility] [address] and [facility] referral

## 2011-05-04 ENCOUNTER — Other Ambulatory Visit: Payer: Self-pay | Admitting: Internal Medicine

## 2011-05-09 NOTE — Assessment & Plan Note (Signed)
[facility] OFFICE NOTE   NAME:Jared [name], Jared [name]                        MRN:          [id number]  DATE:12/29/2006                            DOB:          1946/12/22    REFERRING PHYSICIAN:  Ricard Dillon, MD   REASON FOR REFERRAL:  Dr. Arnoldo Morale asked me to evaluate Mr. Renicker in  consultation regarding intermittent dysphagia.   HISTORY OF PRESENT ILLNESS:  Mr. Villasenor is a very pleasant 65 year old  man who had had 2 to 3 months of gradually progressively solid food  dysphagia.  He describes solid food catching in her upper sternal notch.  He had no troubles with liquids.  It seemed to be getting worse over 2  to 3 months' time.  He was seen by Dr. Arnoldo Morale last week and he was  started on Protonix.  He has been taking Protonix 2-3 hours before  breakfast and just before going to bed.  Since starting Protonix he has  noticed a big improvement in his dysphagia.  He has no GERD symptoms or  acid taste in his mouth.   REVIEW OF SYSTEMS:  Notable for stable weigh.  Otherwise essentially  normal and is available on his nursing intake sheet.   PAST MEDICAL HISTORY:  Hypertension and gout.   CURRENT MEDICATIONS:  1. Cozaar.  2. Labetalol.  3. Amlodipine.  4. Colchicine.  5. Indomethacin.  6. Protonix 40 mg twice daily.   ALLERGIES:  No known drug allergies.   SOCIAL HISTORY:  Married with 4 children, nonsmoker, nondrinker.   FAMILY HISTORY:  No colon cancer, colon polyps in family, no esophageal  disease in family.   PHYSICAL EXAMINATION:  Five feet 8 inches, 243 pounds, blood pressure  160/92, pulse 60.  CONSTITUTIONAL:  Generally well appearing.  NEUROLOGIC:  Alert and oriented x3.  EYES:  Extraocular movements intact.  MOUTH:  Oral pharynx moist, no lesions.  NECK:  Supple, no lymphadenopathy.  CARDIOVASCULAR:  Heart regular rate and rhythm.  LUNGS:  Clear to auscultation bilaterally.  ABDOMEN:   Soft, nontender, nondistended, normal bowel sounds.  EXTREMITIES:  No lower extremity edema.  SKIN:  No rashes or lesions on visible extremities.   ASSESSMENT AND PLAN:  A 65 year old man with progressive solid food  dysphagia, improving on PPI.  I suspect he has had GERD and resultant  lower esophageal edema and inflammation, perhaps stricturing.  PPI has  definitely helped but I still think we should proceed with EGD at his  soonest convenience to make sure that there is no neoplastic process  going on.  Also, we will screen him for Barrett's.  He is taking  Protonix twice daily and I bet he will do fine on just once a day, if he  takes it at the correct time in relation to food, so I have recommended  he begin taking the Protonix 20-30 minutes prior to his breakfast meal  which he eats very regularly.  We will also give him a GERD handout to  go over some lifestyle modifications.  He drinks a bit a  coffee a day  but it does not seem too much and probably is not contributing  dramatically to what may be underlying GERD.  He will be referred for  blood tests prior to his endoscopy.     Milus Banister, MD  Electronically Signed    DPJ/MedQ  DD: 12/29/2006  DT: 12/29/2006  Job #: [id number]   cc:   Ricard Dillon, MD

## 2011-05-11 ENCOUNTER — Other Ambulatory Visit: Payer: Self-pay | Admitting: Internal Medicine

## 2011-06-10 ENCOUNTER — Other Ambulatory Visit: Payer: Self-pay | Admitting: Internal Medicine

## 2011-06-11 ENCOUNTER — Encounter: Payer: Self-pay | Admitting: Internal Medicine

## 2011-06-11 ENCOUNTER — Ambulatory Visit (INDEPENDENT_AMBULATORY_CARE_PROVIDER_SITE_OTHER): Payer: BC Managed Care – PPO | Admitting: Internal Medicine

## 2011-06-11 ENCOUNTER — Other Ambulatory Visit: Payer: Self-pay | Admitting: Internal Medicine

## 2011-06-11 VITALS — BP 156/92 | HR 68 | Temp 99.1°F | Resp 16 | Ht 67.0 in | Wt 240.0 lb

## 2011-06-11 DIAGNOSIS — M1A9XX1 Chronic gout, unspecified, with tophus (tophi): Secondary | ICD-10-CM

## 2011-06-11 DIAGNOSIS — Z0000 Encounter for general adult medical examination without abnormal findings: Secondary | ICD-10-CM

## 2011-06-11 DIAGNOSIS — M1A00X1 Idiopathic chronic gout, unspecified site, with tophus (tophi): Secondary | ICD-10-CM

## 2011-06-11 DIAGNOSIS — N189 Chronic kidney disease, unspecified: Secondary | ICD-10-CM

## 2011-06-11 DIAGNOSIS — I10 Essential (primary) hypertension: Secondary | ICD-10-CM

## 2011-06-11 MED ORDER — DIAZEPAM 5 MG PO TABS: 5.0000 mg | ORAL_TABLET | Freq: Two times a day (BID) | ORAL | Status: DC

## 2011-06-11 NOTE — Progress Notes (Signed)
  Subjective:    Patient ID: Jared [name], male    DOB: 1946/03/24, 65 y.o.   MRN: [id number]  HPI Patient is a 65 year old African American male who presents for complete physical examination.  His comorbid problems include hypertension hyperlipidemia and severe tophaceous gout.  He's been on medications for gout from his rheumatologist and has successfully controlled his flares But his medicines include indomethacin and of note there is some anemia and his pre-physical labs.  He denies any melena or gastric discomfort. He has noticed no frank red blood per rectum  Review of Systems  Genitourinary: Positive for urgency and frequency.  Musculoskeletal: Positive for myalgias, back pain, joint swelling and arthralgias.   Past Medical History  Diagnosis Date  . 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  . GOUT 07/26/2007  . HYPERCHOLESTEROLEMIA, BORDERLINE 01/16/2010  . HYPERTENSION 07/26/2007  . OSTEOARTHRITIS 09/28/2007  . PLANTAR FASCIITIS, BILATERAL 04/19/2010  . PROSTATITIS, RECURRENT 11/08/2009  . RENAL INSUFFICIENCY 07/26/2007  . 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 URINARY FREQUENCY, CHRONIC 03/10/2008   Past Surgical History  Procedure Date  . Rt arm surgery for gout     reports that he has never smoked. He does not have any smokeless tobacco history on file. He reports that he does not drink alcohol or use illicit drugs. family history is not on file. No Known Allergies     Objective:   Physical Exam  Nursing note and vitals reviewed. Constitutional: He is oriented to person, place, and time. He appears well-developed and well-nourished.  HENT:  Head: Normocephalic and atraumatic.  Eyes: Conjunctivae are normal. Pupils are equal, round, and reactive to light.  Neck: Normal range of motion. Neck supple.  Cardiovascular: Normal rate and regular rhythm.   Pulmonary/Chest: Effort normal and breath sounds normal.  Abdominal: Soft. Bowel sounds are  normal.  Musculoskeletal: Normal range of motion.  Neurological: He is alert and oriented to person, place, and time.  Skin: Skin is warm and dry.  Psychiatric: He has a normal mood and affect. His behavior is normal.   He has tophaceous changes to his knuckles in his knees elbows his prostate is +1 and firm with normal architecture       Assessment & Plan:   Patient presents for yearly preventative medicine examination.   all immunizations and health maintenance protocols were reviewed with the patient and they are up to date with these protocols.   screening laboratory values were reviewed with the patient including screening of hyperlipidemia PSA renal function and hepatic function.   There medications past medical history social history problem list and allergies were reviewed in detail.   Goals were established with regard to weight loss exercise diet in compliance with medications   History patient status controlled with a combination of indomethacin colchicine and uloric  He has an enlarged prostate and some urinary frequency.  His cholesterol has been controlled with TriCor his blood pressure is well controlled currently

## 2011-10-10 ENCOUNTER — Ambulatory Visit: Payer: BC Managed Care – PPO | Admitting: Internal Medicine

## 2011-10-23 ENCOUNTER — Ambulatory Visit: Payer: BC Managed Care – PPO | Admitting: Internal Medicine

## 2011-11-15 ENCOUNTER — Other Ambulatory Visit: Payer: Self-pay | Admitting: Internal Medicine

## 2011-12-18 ENCOUNTER — Other Ambulatory Visit: Payer: Self-pay | Admitting: *Deleted

## 2011-12-18 MED ORDER — DIAZEPAM 5 MG PO TABS: 5.0000 mg | ORAL_TABLET | Freq: Two times a day (BID) | ORAL | Status: DC

## 2011-12-26 ENCOUNTER — Telehealth: Payer: Self-pay | Admitting: *Deleted

## 2011-12-26 MED ORDER — LISINOPRIL 40 MG PO TABS: 40.0000 mg | ORAL_TABLET | Freq: Every day | ORAL | Status: DC

## 2011-12-26 NOTE — Telephone Encounter (Signed)
Sent in

## 2011-12-26 NOTE — Telephone Encounter (Signed)
Pt requesting rx refill (90 day supply)of lisinopril sent to [facility].

## 2012-01-03 ENCOUNTER — Ambulatory Visit (INDEPENDENT_AMBULATORY_CARE_PROVIDER_SITE_OTHER): Payer: BC Managed Care – PPO

## 2012-01-03 DIAGNOSIS — Z23 Encounter for immunization: Secondary | ICD-10-CM

## 2012-01-19 ENCOUNTER — Other Ambulatory Visit: Payer: Self-pay | Admitting: Internal Medicine

## 2012-01-22 ENCOUNTER — Telehealth: Payer: Self-pay | Admitting: *Deleted

## 2012-01-22 ENCOUNTER — Other Ambulatory Visit: Payer: Self-pay | Admitting: *Deleted

## 2012-01-22 ENCOUNTER — Encounter: Payer: Self-pay | Admitting: Family

## 2012-01-22 ENCOUNTER — Ambulatory Visit (INDEPENDENT_AMBULATORY_CARE_PROVIDER_SITE_OTHER): Payer: BC Managed Care – PPO | Admitting: Family

## 2012-01-22 VITALS — BP 134/84 | HR 71 | Temp 98.8°F | Wt 250.0 lb

## 2012-01-22 DIAGNOSIS — N529 Male erectile dysfunction, unspecified: Secondary | ICD-10-CM

## 2012-01-22 DIAGNOSIS — M109 Gout, unspecified: Secondary | ICD-10-CM

## 2012-01-22 DIAGNOSIS — I10 Essential (primary) hypertension: Secondary | ICD-10-CM

## 2012-01-22 MED ORDER — AMLODIPINE BESYLATE-VALSARTAN 5-160 MG PO TABS: 1.0000 | ORAL_TABLET | Freq: Every day | ORAL | Status: DC

## 2012-01-22 MED ORDER — LABETALOL HCL 200 MG PO TABS: 200.0000 mg | ORAL_TABLET | Freq: Two times a day (BID) | ORAL | Status: DC

## 2012-01-22 MED ORDER — AMLODIPINE-OLMESARTAN 10-40 MG PO TABS: 1.0000 | ORAL_TABLET | Freq: Every day | ORAL | Status: DC

## 2012-01-22 NOTE — Progress Notes (Signed)
Subjective:    Patient ID: Jared [name], male    DOB: 1946/04/20, 66 y.o.   MRN: [id number]  HPI 66 year old [place] American male, nonsmoker, patient of Dr. Arnoldo [name] is in today for recheck of his blood pressure. He is currently taken a sore 10/40 once daily, lisinopril 40 mg daily and labetalol 200 mg twice daily. Denies any concerns with his medication and side effects. He is Azor has recently gone up to $80 lower co-pay so he would like to consider switching it to a different medication. He does not routinely exercise. Patient denies any lightheadedness, dizziness, chest pain, palpitations, shortness of breath or edema.  Patient also has a history of gout, erectile dysfunction, and anxiety are well maintained on his current medications.   Review of Systems  Constitutional: Negative.   HENT: Negative.   Respiratory: Negative.   Cardiovascular: Negative.   Gastrointestinal: Negative.   Genitourinary: Negative.   Musculoskeletal: Negative.   Skin: Negative.   Neurological: Negative.   Hematological: Negative.   Psychiatric/Behavioral: Negative.    Past Medical History  Diagnosis Date  . 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  . GOUT 07/26/2007  . HYPERCHOLESTEROLEMIA, BORDERLINE 01/16/2010  . HYPERTENSION 07/26/2007  . OSTEOARTHRITIS 09/28/2007  . PLANTAR FASCIITIS, BILATERAL 04/19/2010  . PROSTATITIS, RECURRENT 11/08/2009  . RENAL INSUFFICIENCY 07/26/2007  . 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 URINARY FREQUENCY, CHRONIC 03/10/2008    History   Social History  . Marital Status: Married    Spouse Name: N/A    Number of Children: N/A  . Years of Education: N/A   Occupational History  . Not on file.   Social History Main Topics  . Smoking status: Never Smoker   . Smokeless tobacco: Not on file  . Alcohol Use: No  . Drug Use: No  . Sexually Active: Yes   Other Topics Concern  . Not on file   Social History Narrative  . No narrative on file      Past Surgical History  Procedure Date  . Rt arm surgery for gout     No family history on file.  No Known Allergies  Current Outpatient Prescriptions on File Prior to Visit  Medication Sig Dispense Refill  . CIALIS 5 MG tablet TAKE 1 TABLET BY MOUTH EVERY DAY  30 tablet  8  . diazepam (VALIUM) 5 MG tablet Take 1 tablet (5 mg total) by mouth 2 (two) times daily.  30 tablet  3  . Febuxostat (ULORIC) 80 MG TABS Take by mouth daily.        Marland Kitchen labetalol (NORMODYNE) 200 MG tablet Take 1 tablet (200 mg total) by mouth 2 (two) times daily.  180 tablet  3  . lisinopril (PRINIVIL,ZESTRIL) 40 MG tablet Take 1 tablet (40 mg total) by mouth daily.  90 tablet  3   Current Facility-Administered Medications on File Prior to Visit  Medication Dose Route Frequency Provider Last Rate Last Dose  . methylPREDNISolone acetate (DEPO-MEDROL) injection 40 mg  40 mg Intramuscular Once [employer] Jeb Levering., MD        BP 134/84  Pulse 71  Temp(Src) 98.8 F (37.1 C) (Oral)  Wt 250 lb (113.399 kg)chart    Objective:   Physical Exam  Constitutional: He is oriented to person, place, and time. He appears well-developed and well-nourished.  HENT:  Right Ear: External ear normal.  Left Ear: External ear normal.  Nose: Nose normal.  Mouth/Throat: Oropharynx is clear and moist.  Eyes:  Pupils are equal, round, and reactive to light.  Neck: Normal range of motion. Neck supple.  Cardiovascular: Normal rate, regular rhythm and normal heart sounds.   Pulmonary/Chest: Effort normal and breath sounds normal.  Abdominal: Soft. Bowel sounds are normal.  Musculoskeletal: Normal range of motion.  Neurological: He is alert and oriented to person, place, and time.  Skin: Skin is warm and dry.  Psychiatric: He has a normal mood and affect.          Assessment & Plan:  Assessment: Hypertension, gout, erectile dysfunction, anxiety  Plan: We'll discontinue Azor due to cost. Start X4 to 160/5 once daily. 21  samples were given today. Patient will recheck with Dr. Arnoldo [name] or myself in 2 weeks for reevaluation of his blood pressure. I've advised that we may have to increase the dose at that time. Continue all medications as previously directed. Healthy diet and exercise, sodium reduction. Call the office if symptoms worsen or persist, recheck 2 weeks and sooner when necessary.

## 2012-01-22 NOTE — Telephone Encounter (Signed)
refilled 

## 2012-01-22 NOTE — Patient Instructions (Addendum)
Hypertension As your heart beats, it forces blood through your arteries. This force is your blood pressure. If the pressure is too high, it is called hypertension (HTN) or high blood pressure. HTN is dangerous because you may have it and not know it. High blood pressure may mean that your heart has to work harder to pump blood. Your arteries may be narrow or stiff. The extra work puts you at risk for heart disease, stroke, and other problems.  Blood pressure consists of two numbers, a higher number over a lower, 110/72, for example. It is stated as "110 over 72." The ideal is below 120 for the top number (systolic) and under 80 for the bottom (diastolic). Write down your blood pressure today. You should pay close attention to your blood pressure if you have certain conditions such as:  Heart failure.   Prior heart attack.   Diabetes   Chronic kidney disease.   Prior stroke.   Multiple risk factors for heart disease.  To see if you have HTN, your blood pressure should be measured while you are seated with your arm held at the level of the heart. It should be measured at least twice. A one-time elevated blood pressure reading (especially in the Emergency Department) does not mean that you need treatment. There may be conditions in which the blood pressure is different between your right and left arms. It is important to see your caregiver soon for a recheck. Most people have essential hypertension which means that there is not a specific cause. This type of high blood pressure may be lowered by changing lifestyle factors such as:  Stress.   Smoking.   Lack of exercise.   Excessive weight.   Drug/tobacco/alcohol use.   Eating less salt.  Most people do not have symptoms from high blood pressure until it has caused damage to the body. Effective treatment can often prevent, delay or reduce that damage. TREATMENT  When a cause has been identified, treatment for high blood pressure is  directed at the cause. There are a large number of medications to treat HTN. These fall into several categories, and your caregiver will help you select the medicines that are best for you. Medications may have side effects. You should review side effects with your caregiver. If your blood pressure stays high after you have made lifestyle changes or started on medicines,   Your medication(s) may need to be changed.   Other problems may need to be addressed.   Be certain you understand your prescriptions, and know how and when to take your medicine.   Be sure to follow up with your caregiver within the time frame advised (usually within two weeks) to have your blood pressure rechecked and to review your medications.   If you are taking more than one medicine to lower your blood pressure, make sure you know how and at what times they should be taken. Taking two medicines at the same time can result in blood pressure that is too low.  SEEK IMMEDIATE MEDICAL CARE IF:  You develop a severe headache, blurred or changing vision, or confusion.   You have unusual weakness or numbness, or a faint feeling.   You have severe chest or abdominal pain, vomiting, or breathing problems.  MAKE SURE YOU:   Understand these instructions.   Will watch your condition.   Will get help right away if you are not doing well or get worse.  Document Released: 12/08/2005 Document Revised: 08/20/2011 Document Reviewed:   07/28/2008 [employer] Patient Information 2012 [place].  Exercise to Stay Healthy Exercise helps you become and stay healthy. EXERCISE IDEAS AND TIPS Choose exercises that:  You enjoy.   Fit into your day.  You do not need to exercise really hard to be healthy. You can do exercises at a slow or medium level and stay healthy. You can:  Stretch before and after working out.   Try yoga, Pilates, or tai chi.   Lift weights.   Walk fast, swim, jog, run, climb stairs, bicycle, dance, or  rollerskate.   Take aerobic classes.  Exercises that burn about 150 calories:  Running 1  miles in 15 minutes.   Playing volleyball for 45 to 60 minutes.   Washing and waxing a car for 45 to 60 minutes.   Playing touch football for 45 minutes.   Walking 1  miles in 35 minutes.   Pushing a stroller 1  miles in 30 minutes.   Playing basketball for 30 minutes.   Raking leaves for 30 minutes.   Bicycling 5 miles in 30 minutes.   Walking 2 miles in 30 minutes.   Dancing for 30 minutes.   Shoveling snow for 15 minutes.   Swimming laps for 20 minutes.   Walking up stairs for 15 minutes.   Bicycling 4 miles in 15 minutes.   Gardening for 30 to 45 minutes.   Jumping rope for 15 minutes.   Washing windows or floors for 45 to 60 minutes.  Document Released: 01/10/2011 Document Revised: 08/20/2011 Document Reviewed: 01/10/2011 [facility] Patient Information 2012 [place].

## 2012-02-06 ENCOUNTER — Ambulatory Visit: Payer: BC Managed Care – PPO | Admitting: Internal Medicine

## 2012-02-18 ENCOUNTER — Ambulatory Visit (INDEPENDENT_AMBULATORY_CARE_PROVIDER_SITE_OTHER): Payer: BC Managed Care – PPO | Admitting: Internal Medicine

## 2012-02-18 VITALS — BP 155/80 | HR 80 | Temp 98.2°F | Resp 18 | Ht 68.0 in | Wt 248.0 lb

## 2012-02-18 DIAGNOSIS — T887XXA Unspecified adverse effect of drug or medicament, initial encounter: Secondary | ICD-10-CM

## 2012-02-18 DIAGNOSIS — M109 Gout, unspecified: Secondary | ICD-10-CM

## 2012-02-18 DIAGNOSIS — I10 Essential (primary) hypertension: Secondary | ICD-10-CM

## 2012-02-18 MED ORDER — AMLODIPINE BESYLATE-VALSARTAN 5-160 MG PO TABS: 1.0000 | ORAL_TABLET | Freq: Every day | ORAL | Status: DC

## 2012-02-18 NOTE — Patient Instructions (Signed)
The patient is instructed to continue all medications as prescribed. Schedule followup with check out clerk upon leaving the clinic  

## 2012-02-19 LAB — BASIC METABOLIC PANEL
CO2: 28 mEq/L (ref 19–32)
Calcium: 8.9 mg/dL (ref 8.4–10.5)
GFR: 40.3 mL/min — ABNORMAL LOW (ref >60.00)
Sodium: 139 mEq/L (ref 135–145)

## 2012-02-19 LAB — CBC WITH DIFFERENTIAL/PLATELET
Basophils Relative: 1.5 % (ref 0.0–3.0)
Eosinophils Relative: 5.4 % — ABNORMAL HIGH (ref 0.0–5.0)
Hemoglobin: 13.1 g/dL (ref 13.0–17.0)
Lymphocytes Relative: 26.2 % (ref 12.0–46.0)
Monocytes Relative: 9.1 % (ref 3.0–12.0)
Neutro Abs: 4.3 10*3/uL (ref 1.4–7.7)
RBC: 4.32 Mil/uL (ref 4.22–5.81)
WBC: 7.4 10*3/uL (ref 4.5–10.5)

## 2012-02-19 NOTE — Progress Notes (Signed)
Subjective:    Patient ID: Jared [name], male    DOB: Jan 04, 1946, 66 y.o.   MRN: [id number]  HPI Patient is a 66 year old African American male who presents for followup of hypertension hyperlipidemia a history of gout with tophaceous changes and a history of chronic renal insufficiency stage II his blood pressure is elevated today and he admits that he has been out of his medication for several days.  Generally when he takes his blood pressure medications he states his blood pressures been well controlled as evidenced by monitoring of both work and home.  He is also been compliant with his gout medication has not had recent gout flares he does have a history of chronic renal insufficiency and requires periodic monitoring of his basic metabolic panel.  That is why he presents today because we would not refill his blood pressure medicine until he had his renal functions monitored.     Review of Systems  Constitutional: Negative for fever and fatigue.  HENT: Negative for hearing loss, congestion, neck pain and postnasal drip.   Eyes: Negative for discharge, redness and visual disturbance.  Respiratory: Negative for cough, shortness of breath and wheezing.   Cardiovascular: Negative for leg swelling.  Gastrointestinal: Negative for abdominal pain, constipation and abdominal distention.  Genitourinary: Negative for urgency and frequency.  Musculoskeletal: Negative for joint swelling and arthralgias.  Skin: Negative for color change and rash.  Neurological: Negative for weakness and light-headedness.  Hematological: Negative for adenopathy.  Psychiatric/Behavioral: Negative for behavioral problems.   Past Medical History  Diagnosis Date  . 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  . GOUT 07/26/2007  . HYPERCHOLESTEROLEMIA, BORDERLINE 01/16/2010  . HYPERTENSION 07/26/2007  . OSTEOARTHRITIS 09/28/2007  . PLANTAR FASCIITIS, BILATERAL 04/19/2010  . PROSTATITIS, RECURRENT 11/08/2009    . RENAL INSUFFICIENCY 07/26/2007  . 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 URINARY FREQUENCY, CHRONIC 03/10/2008    History   Social History  . Marital Status: Married    Spouse Name: N/A    Number of Children: N/A  . Years of Education: N/A   Occupational History  . Not on file.   Social History Main Topics  . Smoking status: Never Smoker   . Smokeless tobacco: Not on file  . Alcohol Use: No  . Drug Use: No  . Sexually Active: Yes   Other Topics Concern  . Not on file   Social History Narrative  . No narrative on file    Past Surgical History  Procedure Date  . Rt arm surgery for gout     No family history on file.  No Known Allergies  Current Outpatient Prescriptions on File Prior to Visit  Medication Sig Dispense Refill  . CIALIS 5 MG tablet TAKE 1 TABLET BY MOUTH EVERY DAY  30 tablet  8  . diazepam (VALIUM) 5 MG tablet Take 1 tablet (5 mg total) by mouth 2 (two) times daily.  30 tablet  3  . Febuxostat (ULORIC) 80 MG TABS Take by mouth daily.        Marland Kitchen labetalol (NORMODYNE) 200 MG tablet Take 1 tablet (200 mg total) by mouth 2 (two) times daily.  180 tablet  3  . lisinopril (PRINIVIL,ZESTRIL) 40 MG tablet Take 1 tablet (40 mg total) by mouth daily.  90 tablet  3   Current Facility-Administered Medications on File Prior to Visit  Medication Dose Route Frequency Provider Last Rate Last Dose  . methylPREDNISolone acetate (DEPO-MEDROL) injection 40 mg  40 mg  Intramuscular Once [employer] Jeb Levering., MD        BP 155/80  Pulse 80  Temp 98.2 F (36.8 C)  Resp 18  Ht 5\' 8"  (1.727 m)  Wt 248 lb (112.492 kg)  BMI 37.71 kg/m2       Objective:   Physical Exam  Nursing note and vitals reviewed. Constitutional: He appears well-developed and well-nourished.  HENT:  Head: Normocephalic and atraumatic.  Eyes: Conjunctivae are normal. Pupils are equal, round, and reactive to light.  Neck: Normal range of motion. Neck supple.   Cardiovascular: Normal rate and regular rhythm.   Pulmonary/Chest: Effort normal and breath sounds normal.  Abdominal: Soft. Bowel sounds are normal.          Assessment & Plan:  His tophaceous gout is stable on uloric His blood pressure appears to be well controlled by history on his medication therapy we refilled his medications I will check a basic metabolic panel today as well as a uric acid.  We will monitor his chronic renal insufficiency and have encouraged him to keep his volume loss hydration status as stable as possible Reviewed his health maintenance needs

## 2012-04-07 ENCOUNTER — Telehealth: Payer: Self-pay | Admitting: Internal Medicine

## 2012-04-07 DIAGNOSIS — I10 Essential (primary) hypertension: Secondary | ICD-10-CM

## 2012-04-07 MED ORDER — AMLODIPINE BESYLATE-VALSARTAN 5-160 MG PO TABS: 1.0000 | ORAL_TABLET | Freq: Every day | ORAL | Status: DC

## 2012-04-07 NOTE — Telephone Encounter (Signed)
done

## 2012-04-07 NOTE — Telephone Encounter (Signed)
Patient came in stating that he need a refill on his exforge. Please assist.

## 2012-04-10 ENCOUNTER — Other Ambulatory Visit: Payer: Self-pay | Admitting: Internal Medicine

## 2012-06-12 ENCOUNTER — Other Ambulatory Visit: Payer: Self-pay | Admitting: Internal Medicine

## 2012-06-23 ENCOUNTER — Other Ambulatory Visit: Payer: Self-pay | Admitting: Internal Medicine

## 2012-09-22 ENCOUNTER — Other Ambulatory Visit: Payer: Self-pay | Admitting: Internal Medicine

## 2012-09-23 ENCOUNTER — Other Ambulatory Visit: Payer: Self-pay | Admitting: *Deleted

## 2012-12-22 DIAGNOSIS — Z0271 Encounter for disability determination: Secondary | ICD-10-CM

## 2013-01-19 ENCOUNTER — Ambulatory Visit (INDEPENDENT_AMBULATORY_CARE_PROVIDER_SITE_OTHER): Payer: BC Managed Care – PPO | Admitting: Family Medicine

## 2013-01-19 ENCOUNTER — Encounter: Payer: Self-pay | Admitting: Family Medicine

## 2013-01-19 VITALS — BP 221/115 | HR 67 | Temp 97.5°F | Resp 18

## 2013-01-19 DIAGNOSIS — R739 Hyperglycemia, unspecified: Secondary | ICD-10-CM

## 2013-01-19 DIAGNOSIS — I10 Essential (primary) hypertension: Secondary | ICD-10-CM

## 2013-01-19 DIAGNOSIS — R7309 Other abnormal glucose: Secondary | ICD-10-CM

## 2013-01-19 DIAGNOSIS — E785 Hyperlipidemia, unspecified: Secondary | ICD-10-CM

## 2013-01-19 DIAGNOSIS — R42 Dizziness and giddiness: Secondary | ICD-10-CM

## 2013-01-19 LAB — POCT URINALYSIS DIPSTICK
Bilirubin, UA: NEGATIVE
Blood, UA: NEGATIVE
Glucose, UA: NEGATIVE
Ketones, UA: NEGATIVE
Leukocytes, UA: NEGATIVE
Nitrite, UA: NEGATIVE
Protein, UA: 30
Spec Grav, UA: 1.015
Urobilinogen, UA: 0.2
pH, UA: 5.5

## 2013-01-19 LAB — GLUCOSE, POCT (MANUAL RESULT ENTRY): POC Glucose: 200 mg/dl — AB (ref 70–99)

## 2013-01-19 LAB — POCT GLYCOSYLATED HEMOGLOBIN (HGB A1C): Hemoglobin A1C: 6.4

## 2013-01-19 MED ORDER — METFORMIN HCL 500 MG PO TABS: 500.0000 mg | ORAL_TABLET | Freq: Two times a day (BID) | ORAL | Status: DC

## 2013-01-19 MED ORDER — FUROSEMIDE 40 MG PO TABS: 40.0000 mg | ORAL_TABLET | Freq: Every day | ORAL | Status: DC

## 2013-01-19 NOTE — Patient Instructions (Addendum)
No sodas Return on Friday between 11 and 1  Hypertension As your heart beats, it forces blood through your arteries. This force is your blood pressure. If the pressure is too high, it is called hypertension (HTN) or high blood pressure. HTN is dangerous because you may have it and not know it. High blood pressure may mean that your heart has to work harder to pump blood. Your arteries may be narrow or stiff. The extra work puts you at risk for heart disease, stroke, and other problems.  Blood pressure consists of two numbers, a higher number over a lower, 110/72, for example. It is stated as "110 over 72." The ideal is below 120 for the top number (systolic) and under 80 for the bottom (diastolic). Write down your blood pressure today. You should pay close attention to your blood pressure if you have certain conditions such as:  Heart failure.  Prior heart attack.  Diabetes  Chronic kidney disease.  Prior stroke.  Multiple risk factors for heart disease. To see if you have HTN, your blood pressure should be measured while you are seated with your arm held at the level of the heart. It should be measured at least twice. A one-time elevated blood pressure reading (especially in the Emergency Department) does not mean that you need treatment. There may be conditions in which the blood pressure is different between your right and left arms. It is important to see your caregiver soon for a recheck. Most people have essential hypertension which means that there is not a specific cause. This type of high blood pressure may be lowered by changing lifestyle factors such as:  Stress.  Smoking.  Lack of exercise.  Excessive weight.  Drug/tobacco/alcohol use.  Eating less salt. Most people do not have symptoms from high blood pressure until it has caused damage to the body. Effective treatment can often prevent, delay or reduce that damage. TREATMENT  When a cause has been identified,  treatment for high blood pressure is directed at the cause. There are a large number of medications to treat HTN. These fall into several categories, and your caregiver will help you select the medicines that are best for you. Medications may have side effects. You should review side effects with your caregiver. If your blood pressure stays high after you have made lifestyle changes or started on medicines,   Your medication(s) may need to be changed.  Other problems may need to be addressed.  Be certain you understand your prescriptions, and know how and when to take your medicine.  Be sure to follow up with your caregiver within the time frame advised (usually within two weeks) to have your blood pressure rechecked and to review your medications.  If you are taking more than one medicine to lower your blood pressure, make sure you know how and at what times they should be taken. Taking two medicines at the same time can result in blood pressure that is too low. SEEK IMMEDIATE MEDICAL CARE IF:  You develop a severe headache, blurred or changing vision, or confusion.  You have unusual weakness or numbness, or a faint feeling.  You have severe chest or abdominal pain, vomiting, or breathing problems. MAKE SURE YOU:   Understand these instructions.  Will watch your condition.  Will get help right away if you are not doing well or get worse. Document Released: 12/08/2005 Document Revised: 03/01/2012 Document Reviewed: 07/28/2008 [facility] Patient Information 2013 [place].

## 2013-01-19 NOTE — Progress Notes (Addendum)
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  He saw a ENT doctor recently who moved him around but did not check the blood pressure.  Dizziness continues to fluctuate, although he has worked every day.  No headache, chest pain, shortness of breath or leg swelling.  No h/o kidney problems  Blood pressure began 47 years ago.  Patient now experiencing extreme stress with marital discord in 2nd marriage of 86 years.  Objective:  Alert, unsteady on feet Fundi:  No hemorrhages, PERRLA Neck:  No bruit Chest:  Clear Heart: regular, no murmur Ext:  Trace edema.  EKG:  j point elevation V2, strain pattern with T wave inversion III, V5,  V6  Results for orders placed in visit on 01/19/13  POCT URINALYSIS DIPSTICK      Component Value Range   Color, UA yellow     Clarity, UA clear     Glucose, UA neg     Bilirubin, UA neg     Ketones, UA neg     Spec Grav, UA 1.015     Blood, UA neg     pH, UA 5.5     Protein, UA 30     Urobilinogen, UA 0.2     Nitrite, UA neg     Leukocytes, UA Negative    GLUCOSE, POCT (MANUAL RESULT ENTRY)      Component Value Range   POC Glucose 200 (*) 70 - 99 mg/dl   Assessment:  Uncontrolled hypertension, new onset hyperglycemia.  Since patient improved dramatically after taking an extra labetalol during this visit, I really think that the blood pressure is probably driving the dizziness. He's had no frank vertigo and he does have some new onset elevation in blood sugar. Therefore I think the most prudent thing to do is to have close followup in 2 days, take patient out of work, have patient continue his current medications and in addition give Lasix 40 mg daily to affect a more immediate change in his blood pressure. Finally I am starting him on metformin to control the glucose.  Plan: Followup 48 hours with blood pressure recheck and glucose recheck, Lasix 40 mg daily, metformin 500 mg daily  Consideration is being made for cardiac evaluation  given the strain pattern on his EKG but at this point I don't see an acute injury pattern

## 2013-01-20 LAB — COMPREHENSIVE METABOLIC PANEL
ALT: 21 U/L (ref 0–53)
AST: 21 U/L (ref 0–37)
Albumin: 4.2 g/dL (ref 3.5–5.2)
Alkaline Phosphatase: 91 U/L (ref 39–117)
BUN: 23 mg/dL (ref 6–23)
CO2: 25 mEq/L (ref 19–32)
Calcium: 9.3 mg/dL (ref 8.4–10.5)
Chloride: 103 mEq/L (ref 96–112)
Creat: 2.14 mg/dL — ABNORMAL HIGH (ref 0.50–1.35)
Glucose, Bld: 176 mg/dL — ABNORMAL HIGH (ref 70–99)
Potassium: 4.1 mEq/L (ref 3.5–5.3)
Sodium: 138 mEq/L (ref 135–145)
Total Bilirubin: 0.4 mg/dL (ref 0.3–1.2)
Total Protein: 7 g/dL (ref 6.0–8.3)

## 2013-01-20 LAB — LIPID PANEL
Cholesterol: 180 mg/dL (ref 0–200)
HDL: 24 mg/dL — ABNORMAL LOW (ref >39)
Total CHOL/HDL Ratio: 7.5 Ratio
Triglycerides: 588 mg/dL — ABNORMAL HIGH (ref <150)

## 2013-01-21 ENCOUNTER — Emergency Department (HOSPITAL_COMMUNITY): Payer: BC Managed Care – PPO

## 2013-01-21 ENCOUNTER — Other Ambulatory Visit: Payer: Self-pay

## 2013-01-21 ENCOUNTER — Ambulatory Visit (INDEPENDENT_AMBULATORY_CARE_PROVIDER_SITE_OTHER): Payer: BC Managed Care – PPO | Admitting: Emergency Medicine

## 2013-01-21 ENCOUNTER — Encounter (HOSPITAL_COMMUNITY): Payer: Self-pay | Admitting: Internal Medicine

## 2013-01-21 ENCOUNTER — Inpatient Hospital Stay (HOSPITAL_COMMUNITY)
Admission: EM | Admit: 2013-01-21 | Discharge: 2013-01-23 | DRG: 014 | Disposition: A | Discharge status: Home Health | Payer: BC Managed Care – PPO | Attending: Internal Medicine | Admitting: Internal Medicine

## 2013-01-21 VITALS — BP 197/115 | HR 83 | Temp 99.0°F | Resp 17 | Ht 69.0 in | Wt 236.0 lb

## 2013-01-21 DIAGNOSIS — M1A00X1 Idiopathic chronic gout, unspecified site, with tophus (tophi): Secondary | ICD-10-CM

## 2013-01-21 DIAGNOSIS — N183 Chronic kidney disease, stage 3 unspecified: Secondary | ICD-10-CM | POA: Diagnosis present

## 2013-01-21 DIAGNOSIS — Z79899 Other long term (current) drug therapy: Secondary | ICD-10-CM

## 2013-01-21 DIAGNOSIS — I129 Hypertensive chronic kidney disease with stage 1 through stage 4 chronic kidney disease, or unspecified chronic kidney disease: Secondary | ICD-10-CM | POA: Diagnosis present

## 2013-01-21 DIAGNOSIS — F411 Generalized anxiety disorder: Secondary | ICD-10-CM | POA: Diagnosis present

## 2013-01-21 DIAGNOSIS — M25519 Pain in unspecified shoulder: Secondary | ICD-10-CM | POA: Diagnosis present

## 2013-01-21 DIAGNOSIS — I6350 Cerebral infarction due to unspecified occlusion or stenosis of unspecified cerebral artery: Principal | ICD-10-CM | POA: Diagnosis present

## 2013-01-21 DIAGNOSIS — E119 Type 2 diabetes mellitus without complications: Secondary | ICD-10-CM | POA: Diagnosis present

## 2013-01-21 DIAGNOSIS — E785 Hyperlipidemia, unspecified: Secondary | ICD-10-CM | POA: Diagnosis present

## 2013-01-21 DIAGNOSIS — M109 Gout, unspecified: Secondary | ICD-10-CM | POA: Diagnosis present

## 2013-01-21 DIAGNOSIS — I10 Essential (primary) hypertension: Secondary | ICD-10-CM

## 2013-01-21 DIAGNOSIS — M19019 Primary osteoarthritis, unspecified shoulder: Secondary | ICD-10-CM | POA: Diagnosis present

## 2013-01-21 DIAGNOSIS — N259 Disorder resulting from impaired renal tubular function, unspecified: Secondary | ICD-10-CM

## 2013-01-21 DIAGNOSIS — Z823 Family history of stroke: Secondary | ICD-10-CM

## 2013-01-21 DIAGNOSIS — R42 Dizziness and giddiness: Secondary | ICD-10-CM

## 2013-01-21 DIAGNOSIS — G459 Transient cerebral ischemic attack, unspecified: Secondary | ICD-10-CM

## 2013-01-21 DIAGNOSIS — I639 Cerebral infarction, unspecified: Secondary | ICD-10-CM

## 2013-01-21 DIAGNOSIS — I161 Hypertensive emergency: Secondary | ICD-10-CM

## 2013-01-21 HISTORY — DX: Cerebral infarction, unspecified: I63.9

## 2013-01-21 LAB — COMPREHENSIVE METABOLIC PANEL
ALT: 22 U/L (ref 0–53)
Alkaline Phosphatase: 101 U/L (ref 39–117)
BUN: 18 mg/dL (ref 6–23)
CO2: 26 mEq/L (ref 19–32)
Chloride: 100 mEq/L (ref 96–112)
GFR calc Af Amer: 42 mL/min — ABNORMAL LOW (ref >90)
GFR calc non Af Amer: 36 mL/min — ABNORMAL LOW (ref >90)
Glucose, Bld: 115 mg/dL — ABNORMAL HIGH (ref 70–99)
Potassium: 4.1 mEq/L (ref 3.5–5.1)
Sodium: 137 mEq/L (ref 135–145)
Total Bilirubin: 0.6 mg/dL (ref 0.3–1.2)
Total Protein: 7.6 g/dL (ref 6.0–8.3)

## 2013-01-21 LAB — CBC WITH DIFFERENTIAL/PLATELET
Hemoglobin: 13.9 g/dL (ref 13.0–17.0)
Lymphocytes Relative: 21 % (ref 12–46)
Lymphs Abs: 1.6 10*3/uL (ref 0.7–4.0)
Monocytes Relative: 9 % (ref 3–12)
Neutro Abs: 4.9 10*3/uL (ref 1.7–7.7)
Neutrophils Relative %: 66 % (ref 43–77)
Platelets: 225 10*3/uL (ref 150–400)
RBC: 4.89 MIL/uL (ref 4.22–5.81)
WBC: 7.5 10*3/uL (ref 4.0–10.5)

## 2013-01-21 MED ORDER — FEBUXOSTAT 40 MG PO TABS
80.0000 mg | ORAL_TABLET | Freq: Every day | ORAL | Status: DC
  Administered 2013-01-22 – 2013-01-23 (×2): 80 mg via ORAL
  Filled 2013-01-21 (×2): qty 2

## 2013-01-21 MED ORDER — ASPIRIN 325 MG PO TABS
325.0000 mg | ORAL_TABLET | Freq: Every day | ORAL | Status: DC
  Filled 2013-01-21: qty 1

## 2013-01-21 MED ORDER — ONDANSETRON HCL 4 MG/2ML IJ SOLN: 4.0000 mg | Freq: Four times a day (QID) | INTRAMUSCULAR | Status: DC | PRN

## 2013-01-21 MED ORDER — ACETAMINOPHEN 325 MG PO TABS
650.0000 mg | ORAL_TABLET | ORAL | Status: DC | PRN
  Administered 2013-01-22 – 2013-01-23 (×2): 650 mg via ORAL
  Filled 2013-01-21 (×2): qty 2

## 2013-01-21 MED ORDER — ASPIRIN 300 MG RE SUPP
300.0000 mg | Freq: Every day | RECTAL | Status: DC
  Filled 2013-01-21: qty 1

## 2013-01-21 MED ORDER — ONDANSETRON HCL 4 MG/2ML IJ SOLN
INTRAMUSCULAR | Status: AC
  Filled 2013-01-21: qty 2

## 2013-01-21 MED ORDER — AMLODIPINE BESYLATE-VALSARTAN 5-160 MG PO TABS: 1.0000 | ORAL_TABLET | Freq: Every day | ORAL | Status: DC

## 2013-01-21 MED ORDER — LABETALOL HCL 200 MG PO TABS
200.0000 mg | ORAL_TABLET | Freq: Two times a day (BID) | ORAL | Status: DC
  Administered 2013-01-22 (×3): 200 mg via ORAL
  Filled 2013-01-21 (×5): qty 1

## 2013-01-21 MED ORDER — INSULIN ASPART 100 UNIT/ML ~~LOC~~ SOLN
0.0000 [IU] | Freq: Three times a day (TID) | SUBCUTANEOUS | Status: DC
  Administered 2013-01-22: 2 [IU] via SUBCUTANEOUS
  Administered 2013-01-23: 1 [IU] via SUBCUTANEOUS

## 2013-01-21 MED ORDER — SODIUM CHLORIDE 0.9 % IV SOLN
INTRAVENOUS | Status: DC
  Administered 2013-01-22: via INTRAVENOUS

## 2013-01-21 MED ORDER — AMLODIPINE BESYLATE 5 MG PO TABS
5.0000 mg | ORAL_TABLET | Freq: Every day | ORAL | Status: DC
  Filled 2013-01-21: qty 1

## 2013-01-21 MED ORDER — ACETAMINOPHEN 650 MG RE SUPP: 650.0000 mg | RECTAL | Status: DC | PRN

## 2013-01-21 MED ORDER — DIAZEPAM 5 MG PO TABS: 5.0000 mg | ORAL_TABLET | Freq: Four times a day (QID) | ORAL | Status: DC | PRN

## 2013-01-21 MED ORDER — IRBESARTAN 150 MG PO TABS
150.0000 mg | ORAL_TABLET | Freq: Every day | ORAL | Status: DC
  Administered 2013-01-22 – 2013-01-23 (×2): 150 mg via ORAL
  Filled 2013-01-21 (×2): qty 1

## 2013-01-21 MED ORDER — SENNOSIDES-DOCUSATE SODIUM 8.6-50 MG PO TABS
1.0000 | ORAL_TABLET | Freq: Every evening | ORAL | Status: DC | PRN
  Filled 2013-01-21: qty 1

## 2013-01-21 MED ORDER — INSULIN ASPART 100 UNIT/ML ~~LOC~~ SOLN: 0.0000 [IU] | Freq: Every day | SUBCUTANEOUS | Status: DC

## 2013-01-21 MED ORDER — HYDRALAZINE HCL 20 MG/ML IJ SOLN
10.0000 mg | INTRAMUSCULAR | Status: DC | PRN
  Administered 2013-01-22: 10 mg via INTRAVENOUS
  Filled 2013-01-21: qty 1

## 2013-01-21 MED ORDER — SODIUM CHLORIDE 0.9 % IV SOLN: INTRAVENOUS | Status: AC

## 2013-01-21 MED ORDER — LABETALOL HCL 5 MG/ML IV SOLN
10.0000 mg | Freq: Once | INTRAVENOUS | Status: AC
  Administered 2013-01-21: 10 mg via INTRAVENOUS
  Filled 2013-01-21: qty 4

## 2013-01-21 MED ORDER — ONDANSETRON HCL 4 MG/2ML IJ SOLN
4.0000 mg | Freq: Once | INTRAMUSCULAR | Status: AC
Start: 2013-01-21 — End: 2013-01-21
  Administered 2013-01-21: 4 mg via INTRAVENOUS

## 2013-01-21 NOTE — ED Notes (Signed)
Admitting MD at bedside.

## 2013-01-21 NOTE — Progress Notes (Signed)
MRI reviewed and shows a small nonhemorrhagic left cerebellar infarct.  Patient not a tPA candidate.   Recommendations: 1.  Medicine admission with stroke team to see in follow up. 2. HgbA1c, fasting lipid panel 3. PT consult, OT consult, Speech consult 4. Echocardiogram 5. Carotid dopplers 6. Prophylactic therapy-Antiplatelet med: Aspirin - dose 81 mg daily 7. Risk factor modification 8. Telemetry monitoring 9. Frequent neuro checks  Alexis Goodell, MD [facility] [phone]

## 2013-01-21 NOTE — ED Notes (Signed)
Pt returned from MR

## 2013-01-21 NOTE — H&P (Signed)
PCP:   Georgetta Haber, MD    Chief Complaint:   Double vision HPI: Jared [name] is a 67 y.o. 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   Presented with  1 week hx of episodic double vision associated with fatigue and feelings  of nausea and occasional sweats. He seemed to be leaning to the left at times when he is walking. No change in speech. He had episodic loss of vision and occasionally had bouts of vertigo. Patient was seen for this in Urgent care today and was sent to ER. In ER MRI showed evidence of non hemorrhagic infarct in the inferior left cerebellum. Neurology have seen him and recommended Medical admission. As a side note he was recently diagnosed and border-line diabetic and started on metformin.   Review of Systems:    Pertinent positives include:  no localizing neurological complaints, right foot tingling,   Constitutional:  No weight loss, night sweats, Fevers, chills, fatigue, weight loss  HEENT:  No headaches, Difficulty swallowing,Tooth/dental problems,Sore throat,  No sneezing, itching, ear ache, nasal congestion, post nasal drip,  Cardio-vascular:  No chest pain, Orthopnea, PND, anasarca, dizziness, palpitations.no Bilateral lower extremity swelling  GI:  No heartburn, indigestion, abdominal pain, nausea, vomiting, diarrhea, change in bowel habits, loss of appetite, melena, blood in stool, hematemesis Resp:  no shortness of breath at rest. No dyspnea on exertion, No excess mucus, no productive cough, No non-productive cough, No coughing up of blood.No change in color of mucus.No  wheezing. Skin:  no rash or lesions. No jaundice GU:  no dysuria, change in color of urine, no urgency or frequency. No straining to urinate.  No flank pain.  Musculoskeletal:  No joint pain or no joint swelling. No decreased range of motion. No back pain.  Psych:  No change in mood or affect. No depression or anxiety. No memory loss.  Neuro: no weakness, no double vision, no gait abnormality, no slurred speech, no confusion  Otherwise ROS are negative except for above, 10 systems were reviewed  Past Medical History: Past Medical History  Diagnosis Date  . 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  . GOUT 07/26/2007  . HYPERCHOLESTEROLEMIA, BORDERLINE 01/16/2010  . HYPERTENSION 07/26/2007  . OSTEOARTHRITIS 09/28/2007  . PLANTAR FASCIITIS, BILATERAL 04/19/2010  . PROSTATITIS, RECURRENT 11/08/2009  . RENAL INSUFFICIENCY 07/26/2007  . 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 URINARY FREQUENCY, CHRONIC 03/10/2008   Past Surgical History  Procedure Date  . Rt arm surgery for gout      Medications: Prior to Admission medications   Medication Sig Start Date End Date Taking? Authorizing Provider  amLODipine-valsartan (EXFORGE) 5-160 MG per tablet Take 1 tablet by mouth daily. 04/07/12 04/07/13 Yes Ricard Dillon, MD  diazepam (VALIUM) 5 MG tablet Take 5 mg by mouth every 6 (six) hours as needed. For anxiety   Yes Historical Provider, MD  Febuxostat (ULORIC) 80 MG TABS Take 80 mg by mouth daily.    Yes Historical Provider, MD  furosemide (LASIX) 40 MG tablet Take 40 mg by mouth daily.   Yes Historical Provider, MD  indomethacin (INDOCIN) 50 MG capsule Take 50 mg by mouth 3 (three) times daily with meals.   Yes Historical Provider, MD  labetalol (NORMODYNE) 200 MG tablet Take 200  mg by mouth 2 (two) times daily. 01/22/12  Yes Ricard Dillon, MD  metFORMIN (GLUCOPHAGE) 500 MG tablet Take 500 mg by mouth 2 (two) times daily with a meal. 01/19/13  Yes Robyn Haber, MD   Multiple Vitamin (MULTIVITAMIN WITH MINERALS) TABS Take 1 tablet by mouth daily.   Yes Historical Provider, MD    Allergies:   Allergies  Allergen Reactions  . Other     Strawberries Cant breath    Social History:  Ambulatory  Independently  Lives at   Home family   reports that he has never smoked. He does not have any smokeless tobacco history on file. He reports that he does not drink alcohol or use illicit drugs.   Family History: family history includes CVA in his father; Diabetes in his sister; and Heart disease in his father and mother.    Physical Exam: Patient Vitals for the past 24 hrs:  BP Temp Temp src Pulse Resp SpO2  01/21/13 2220 - 98.2 F (36.8 C) - - - -  01/21/13 2200 185/114 mmHg - - - 17  -  01/21/13 2100 153/92 mmHg - - - 20  -  01/21/13 2030 180/102 mmHg - - - 17  -  01/21/13 1815 201/95 mmHg - - - 15  -  01/21/13 1600 194/104 mmHg - - 60  12  100 %  01/21/13 1525 195/105 mmHg - - 85  - -  01/21/13 1524 193/114 mmHg - - 83  - -  01/21/13 1521 185/102 mmHg - - 72  - -  01/21/13 1506 204/112 mmHg 98.1 F (36.7 C) Oral 78  18  99 %    1. General:  in No Acute distress 2. Psychological: Alert and  Oriented 3. Head/ENT:   Moist  Mucous Membranes                          Head Non traumatic, neck supple                          Normal   Dentition 4. SKIN: normal  Skin turgor,  Skin clean Dry and intact no rash 5. Heart: Regular rate and rhythm no Murmur, Rub or gallop 6. Lungs: Clear to auscultation bilaterally, no wheezes or crackles   7. Abdomen: Soft, non-tender, Non distended 8. Lower extremities: no clubbing, cyanosis, or edema 9. Neurologically: mild nystagmus to the left, CN 2-12 intact, strength 5/5 in all 4 extremities. 10. MSK: Normal range of motion  body mass index is unknown because there is no height or weight on file.   Labs on Admission:   [facility] 01/21/13 1535 01/19/13 1835  NA 137 138  K 4.1 4.1  CL 100 103  CO2 26 25   GLUCOSE 115* 176*  BUN 18 23  CREATININE 1.89* 2.14*  CALCIUM 9.5 9.3  MG -- --  PHOS -- --    Basename 01/21/13 1535 01/19/13 1835  AST 20 21  ALT 22 21  ALKPHOS 101 91  BILITOT 0.6 0.4  PROT 7.6 7.0  ALBUMIN 3.8 4.2   No results found for this basename: LIPASE:2,AMYLASE:2 in the last 72 hours  Basename 01/21/13 1535  WBC 7.5  NEUTROABS 4.9  HGB 13.9  HCT 41.6  MCV 85.1  PLT 225    Basename 01/21/13 1535  CKTOTAL --  CKMB --  CKMBINDEX --  TROPONINI <0.30   No results found  for this basename: TSH,T4TOTAL,FREET3,T3FREE,THYROIDAB in the last 72 hours No results found for this basename: VITAMINB12:2,FOLATE:2,FERRITIN:2,TIBC:2,IRON:2,RETICCTPCT:2 in the last 72 hours Lab Results  Component Value Date   HGBA1C 6.4 01/19/2013    The CrCl is unknown because both a height and weight (above a minimum accepted value) are required for this calculation. ABG No results found for this basename: phart, pco2, po2, hco3, tco2, acidbasedef, o2sat     No results found for this basename: DDIMER     Other results:  I have pearsonaly reviewed this: ECG REPORT  Rate: 71  Rhythm: NSR ST&T Change: No ischemic cahnges  UA no evidence of infection  Cultures: No results found for this basename: sdes, specrequest, cult, reptstatus       Radiological Exams on Admission: Ct Head Wo Contrast  01/21/2013  *RADIOLOGY REPORT*  Clinical Data: Hypertension, loss of vision, dizziness  CT HEAD WITHOUT CONTRAST  Technique:  Contiguous axial images were obtained from the base of the skull through the vertex without contrast.  Comparison: None.  Findings:  Scattered very minimal periventricular hypodensities suggestive of microvascular ischemic disease.  Gray white differentiation is otherwise well maintained.  No CT evidence of acute large territory infarct.  No intraparenchymal or extra-axial mass or hemorrhage. There is mild diffuse increased attenuation of the intracranial blood pool  suggestive of volume depletion. Normal size and configuration of the ventricles and basilar cisterns.  No midline shift.  Limited visualization of the paranasal sinuses and mastoid air cells are normal.  Regional soft tissues are normal.  No displaced calvarial fracture.  IMPRESSION: Minimal microvascular ischemic disease without acute intracranial process.   Original Report Authenticated By: Jake Seats, MD    Mr [facility] Wo Contrast  01/21/2013  *RADIOLOGY REPORT*  Clinical Data:  Dizziness.  Stroke.  MRI HEAD WITHOUT CONTRAST MRA HEAD WITHOUT CONTRAST  Technique:  Multiplanar, multiecho pulse sequences of the brain and surrounding structures were obtained without intravenous contrast. Angiographic images of the head were obtained using MRA technique without contrast.  Comparison:  CT head without contrast 01/22/2012.  MRI HEAD  Findings:  The diffusion weighted images demonstrate a 6 mm focal infarct in the inferior left cerebellum.  There is no associated hemorrhage.  T2 signal is associated.  Minimal atrophy white matter disease is otherwise within normal limits for age.  No hemorrhage or mass lesion is present.  Abnormal signal is present within the left vertebral artery suggesting slow flow.  The basilar artery is within normal limits.  The anterior circulation is unremarkable.  The globes and orbits are intact.  The paranasal sinuses and mastoid air cells are clear.  IMPRESSION:  1.  Acute / subacute 6 mm non hemorrhagic infarct in the inferior left cerebellum. 2.  Abnormal signal within the left vertebral artery compatible with slow or occluded flow.  MRA HEAD  Findings: The internal carotid arteries are within normal limits bilaterally.  Prominent posterior communicating arteries are present bilaterally.  The A1 and M1 segments are normal.  The MCA bifurcations are within normal limits.  The anterior communicating artery is patent.  There is some attenuation of MCA branch vessels bilaterally.  The  right vertebral artery is the dominant vessel.  There is segmental signal loss within the smaller left vertebral artery. The right PICA is visualized and normal.  The basilar artery is within normal limits.  The basilar artery terminates at the superior cerebellar arteries with fetal type posterior cerebral arteries bilaterally.  There is some attenuation  of distal PCA branch vessels.  IMPRESSION:  1.  Mild to moderate small vessel disease. 2.  Segmental signal loss within the left vertebral artery suggesting a significant focal stenosis or perhaps dissection. 3.  Fetal type posterior cerebral arteries. 4.  Moderate small vessel disease.   Original Report Authenticated By: San Morelle, M.D.    Mr Brain Wo Contrast  01/21/2013  *RADIOLOGY REPORT*  Clinical Data:  Dizziness.  Stroke.  MRI HEAD WITHOUT CONTRAST MRA HEAD WITHOUT CONTRAST  Technique:  Multiplanar, multiecho pulse sequences of the brain and surrounding structures were obtained without intravenous contrast. Angiographic images of the head were obtained using MRA technique without contrast.  Comparison:  CT head without contrast 01/22/2012.  MRI HEAD  Findings:  The diffusion weighted images demonstrate a 6 mm focal infarct in the inferior left cerebellum.  There is no associated hemorrhage.  T2 signal is associated.  Minimal atrophy white matter disease is otherwise within normal limits for age.  No hemorrhage or mass lesion is present.  Abnormal signal is present within the left vertebral artery suggesting slow flow.  The basilar artery is within normal limits.  The anterior circulation is unremarkable.  The globes and orbits are intact.  The paranasal sinuses and mastoid air cells are clear.  IMPRESSION:  1.  Acute / subacute 6 mm non hemorrhagic infarct in the inferior left cerebellum. 2.  Abnormal signal within the left vertebral artery compatible with slow or occluded flow.  MRA HEAD  Findings: The internal carotid arteries are within  normal limits bilaterally.  Prominent posterior communicating arteries are present bilaterally.  The A1 and M1 segments are normal.  The MCA bifurcations are within normal limits.  The anterior communicating artery is patent.  There is some attenuation of MCA branch vessels bilaterally.  The right vertebral artery is the dominant vessel.  There is segmental signal loss within the smaller left vertebral artery. The right PICA is visualized and normal.  The basilar artery is within normal limits.  The basilar artery terminates at the superior cerebellar arteries with fetal type posterior cerebral arteries bilaterally.  There is some attenuation of distal PCA branch vessels.  IMPRESSION:  1.  Mild to moderate small vessel disease. 2.  Segmental signal loss within the left vertebral artery suggesting a significant focal stenosis or perhaps dissection. 3.  Fetal type posterior cerebral arteries. 4.  Moderate small vessel disease.   Original Report Authenticated By: San Morelle, M.D.     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   Present on Admission:  . CVA (cerebral infarction) -  will admit based on TIA/CVA protocol, await results of Carotid Doppler and Echo, obtain cardiac enzymes,  ECG,  Lipid panel, TSH. Order PT/OT evaluation. Will make sure patient is on antiplatelet agent.   Neurology is aware of the patient .      Marland Kitchen HYPERTENSION - continue home medications and PRN hydralazine . Diabetes mellitus -SSI   Prophylaxis: SCD   CODE STATUS: FULL CODE  Other plan as per orders.  I have spent a total of 55 min on this admission  Keyasia Jolliff 01/21/2013, 10:35 PM

## 2013-01-21 NOTE — ED Notes (Signed)
Pt reports that episode has subsided at this time. Pt remains orientated. States " I can feel these episodes coming on."
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
## 2013-01-21 NOTE — ED Notes (Signed)
Pt reports having episodes occasionally. Family at bedside. Pt remains AO.
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
## 2013-01-21 NOTE — ED Notes (Signed)
Pt returned from CT and then taken to MR.

## 2013-01-21 NOTE — ED Notes (Signed)
Verbal order given by nurse to do vitals on patient.
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
## 2013-01-21 NOTE — ED Notes (Signed)
Pt reporting another episode of blurred vision, dizziness. Pt diaphoretic. MD Linker and PA Nehemiah Settle made aware; at bedside. VSS. Pt reporting nausea.

## 2013-01-21 NOTE — Consult Note (Addendum)
NEURO HOSPITALIST CONSULT NOTE    Reason for Consult:double vision, vertigo, imbalance.  HPI:                                                                                                                                          Jared [name] is an 67 y.o. male with a past medical history significant for hypertension, hypercholesterolemia, and osteoarthritis, who in the past couple of days has been experiencing intermittent episodes characterized by lightheadedness, room spinning, double vision, and imbalance that typically last for several minutes and completely resolve. There is no associated headache, difficulty swallowing, focal weakness or numbness, slurred speech, or confusion. He expressed that the first episode occurred while he was driving and he had to pull over because he couldn't see well. Never had similar episodes before. He presented to the ED for further evaluation and was noted to have significantly elevated blood pressure. 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 MRA brain did not shoed significant intracranial arterial disease.    Past Medical History  Diagnosis Date  . 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  . GOUT 07/26/2007  . HYPERCHOLESTEROLEMIA, BORDERLINE 01/16/2010  . HYPERTENSION 07/26/2007  . OSTEOARTHRITIS 09/28/2007  . PLANTAR FASCIITIS, BILATERAL 04/19/2010  . PROSTATITIS, RECURRENT 11/08/2009  . RENAL INSUFFICIENCY 07/26/2007  . 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 URINARY FREQUENCY, CHRONIC 03/10/2008    Past Surgical History  Procedure Date  . Rt arm surgery for gout     No family history on file.  Family History: non contributory.  Social History:  reports that he has never smoked. He does not have any smokeless tobacco history on file. He reports that he does not drink alcohol or use illicit drugs.  Allergies   Allergen Reactions  . Other     Strawberries Cant breath    MEDICATIONS:                                                                                                                     I have reviewed the patient's current medications.   ROS:  History obtained from the patient  General ROS: negative for - chills, fatigue, fever, night sweats, weight gain or weight loss Psychological ROS: negative for - behavioral disorder, hallucinations, memory difficulties, mood swings or suicidal ideation Ophthalmic ROS: negative for - blurry vision, double vision, eye pain or loss of vision ENT ROS: negative for - epistaxis, nasal discharge, oral lesions, sore throat, tinnitus or vertigo Allergy and Immunology ROS: negative for - hives or itchy/watery eyes Hematological and Lymphatic ROS: negative for - bleeding problems, bruising or swollen lymph nodes Endocrine ROS: negative for - galactorrhea, hair pattern changes, polydipsia/polyuria or temperature intolerance Respiratory ROS: negative for - cough, hemoptysis, shortness of breath or wheezing Cardiovascular ROS: negative for - chest pain, dyspnea on exertion, edema or irregular heartbeat Gastrointestinal ROS: negative for - abdominal pain, diarrhea, hematemesis, nausea/vomiting or stool incontinence Genito-Urinary ROS: negative for - dysuria, hematuria, incontinence or urinary frequency/urgency Musculoskeletal ROS: negative for - joint swelling or muscular weakness Neurological ROS: as noted in HPI Dermatological ROS: negative for rash and skin lesion changes  Physical exam: Head: normocephalic. Neck: supple. Cardiac: no murmurs. Lungs: clear. Abdomen: soft. Extremities: no edema. Blood pressure 201/95, pulse 60, temperature 98.1 F (36.7 C), temperature source Oral, resp. rate 15, SpO2  100.00%.   Neurologic Examination:                                                                                                      Mental Status: Alert, oriented, thought content appropriate.  Speech fluent without evidence of aphasia.  Able to follow 3 step commands without difficulty. Cranial Nerves: II: Discs flat bilaterally; Visual fields grossly normal, pupils equal, round, reactive to light and accommodation III,IV, VI: ptosis not present, extra-ocular motions intact bilaterally V,VII: smile symmetric, facial light touch sensation normal bilaterally VIII: hearing normal bilaterally IX,X: gag reflex present XI: bilateral shoulder shrug XII: midline tongue extension Motor: Right : Upper extremity   5/5    Left:     Upper extremity   5/5  Lower extremity   5/5     Lower extremity   5/5 Tone and bulk:normal tone throughout; no atrophy noted Sensory: Pinprick and light touch intact throughout, bilaterally Deep Tendon Reflexes: 2+ and symmetric throughout Plantars: Right: downgoing   Left: downgoing Cerebellar: He has dysmetria in the left and some troubles performing heel knee to shin. Gait: no ataxia. CV: pulses palpable throughout     Lab Results  Component Value Date/Time   CHOL 180 01/19/2013  7:21 PM    Results for orders placed during the hospital encounter of 01/21/13 (from the past 48 hour(s))  CBC WITH DIFFERENTIAL     Status: Normal   Collection Time   01/21/13  3:35 PM      Component Value Range Comment   WBC 7.5  4.0 - 10.5 K/uL    RBC 4.89  4.22 - 5.81 MIL/uL    Hemoglobin 13.9  13.0 - 17.0 g/dL    HCT 41.6  39.0 - 52.0 %    MCV 85.1  78.0 - 100.0 fL  MCH 28.4  26.0 - 34.0 pg    MCHC 33.4  30.0 - 36.0 g/dL    RDW 13.7  11.5 - 15.5 %    Platelets 225  150 - 400 K/uL    Neutrophils Relative 66  43 - 77 %    Neutro Abs 4.9  1.7 - 7.7 K/uL    Lymphocytes Relative 21  12 - 46 %    Lymphs Abs 1.6  0.7 - 4.0 K/uL    Monocytes Relative 9  3 - 12 %     Monocytes Absolute 0.7  0.1 - 1.0 K/uL    Eosinophils Relative 4  0 - 5 %    Eosinophils Absolute 0.3  0.0 - 0.7 K/uL    Basophils Relative 1  0 - 1 %    Basophils Absolute 0.1  0.0 - 0.1 K/uL   COMPREHENSIVE METABOLIC PANEL     Status: Abnormal   Collection Time   01/21/13  3:35 PM      Component Value Range Comment   Sodium 137  135 - 145 mEq/L    Potassium 4.1  3.5 - 5.1 mEq/L    Chloride 100  96 - 112 mEq/L    CO2 26  19 - 32 mEq/L    Glucose, Bld 115 (*) 70 - 99 mg/dL    BUN 18  6 - 23 mg/dL    Creatinine, Ser 1.89 (*) 0.50 - 1.35 mg/dL    Calcium 9.5  8.4 - 10.5 mg/dL    Total Protein 7.6  6.0 - 8.3 g/dL    Albumin 3.8  3.5 - 5.2 g/dL    AST 20  0 - 37 U/L    ALT 22  0 - 53 U/L    Alkaline Phosphatase 101  39 - 117 U/L    Total Bilirubin 0.6  0.3 - 1.2 mg/dL    GFR calc non Af Amer 36 (*) >90 mL/min    GFR calc Af Amer 42 (*) >90 mL/min   TROPONIN I     Status: Normal   Collection Time   01/21/13  3:35 PM      Component Value Range Comment   Troponin I <0.30  <0.30 ng/mL     Ct Head Wo Contrast  01/21/2013  *RADIOLOGY REPORT*  Clinical Data: Hypertension, loss of vision, dizziness  CT HEAD WITHOUT CONTRAST  Technique:  Contiguous axial images were obtained from the base of the skull through the vertex without contrast.  Comparison: None.  Findings:  Scattered very minimal periventricular hypodensities suggestive of microvascular ischemic disease.  Gray white differentiation is otherwise well maintained.  No CT evidence of acute large territory infarct.  No intraparenchymal or extra-axial mass or hemorrhage. There is mild diffuse increased attenuation of the intracranial blood pool suggestive of volume depletion. Normal size and configuration of the ventricles and basilar cisterns.  No midline shift.  Limited visualization of the paranasal sinuses and mastoid air cells are normal.  Regional soft tissues are normal.  No displaced calvarial fracture.  IMPRESSION: Minimal  microvascular ischemic disease without acute intracranial process.   Original Report Authenticated By: Jake Seats, MD    Mr [facility] Wo Contrast  01/21/2013  *RADIOLOGY REPORT*  Clinical Data:  Dizziness.  Stroke.  MRI HEAD WITHOUT CONTRAST MRA HEAD WITHOUT CONTRAST  Technique:  Multiplanar, multiecho pulse sequences of the brain and surrounding structures were obtained without intravenous contrast. Angiographic images of the head were obtained using MRA technique without  contrast.  Comparison:  CT head without contrast 01/22/2012.  MRI HEAD  Findings:  The diffusion weighted images demonstrate a 6 mm focal infarct in the inferior left cerebellum.  There is no associated hemorrhage.  T2 signal is associated.  Minimal atrophy white matter disease is otherwise within normal limits for age.  No hemorrhage or mass lesion is present.  Abnormal signal is present within the left vertebral artery suggesting slow flow.  The basilar artery is within normal limits.  The anterior circulation is unremarkable.  The globes and orbits are intact.  The paranasal sinuses and mastoid air cells are clear.  IMPRESSION:  1.  Acute / subacute 6 mm non hemorrhagic infarct in the inferior left cerebellum. 2.  Abnormal signal within the left vertebral artery compatible with slow or occluded flow.  MRA HEAD  Findings: The internal carotid arteries are within normal limits bilaterally.  Prominent posterior communicating arteries are present bilaterally.  The A1 and M1 segments are normal.  The MCA bifurcations are within normal limits.  The anterior communicating artery is patent.  There is some attenuation of MCA branch vessels bilaterally.  The right vertebral artery is the dominant vessel.  There is segmental signal loss within the smaller left vertebral artery. The right PICA is visualized and normal.  The basilar artery is within normal limits.  The basilar artery terminates at the superior cerebellar arteries with fetal type  posterior cerebral arteries bilaterally.  There is some attenuation of distal PCA branch vessels.  IMPRESSION:  1.  Mild to moderate small vessel disease. 2.  Segmental signal loss within the left vertebral artery suggesting a significant focal stenosis or perhaps dissection. 3.  Fetal type posterior cerebral arteries. 4.  Moderate small vessel disease.   Original Report Authenticated By: San Morelle, M.D.    Mr Brain Wo Contrast  01/21/2013  *RADIOLOGY REPORT*  Clinical Data:  Dizziness.  Stroke.  MRI HEAD WITHOUT CONTRAST MRA HEAD WITHOUT CONTRAST  Technique:  Multiplanar, multiecho pulse sequences of the brain and surrounding structures were obtained without intravenous contrast. Angiographic images of the head were obtained using MRA technique without contrast.  Comparison:  CT head without contrast 01/22/2012.  MRI HEAD  Findings:  The diffusion weighted images demonstrate a 6 mm focal infarct in the inferior left cerebellum.  There is no associated hemorrhage.  T2 signal is associated.  Minimal atrophy white matter disease is otherwise within normal limits for age.  No hemorrhage or mass lesion is present.  Abnormal signal is present within the left vertebral artery suggesting slow flow.  The basilar artery is within normal limits.  The anterior circulation is unremarkable.  The globes and orbits are intact.  The paranasal sinuses and mastoid air cells are clear.  IMPRESSION:  1.  Acute / subacute 6 mm non hemorrhagic infarct in the inferior left cerebellum. 2.  Abnormal signal within the left vertebral artery compatible with slow or occluded flow.  MRA HEAD  Findings: The internal carotid arteries are within normal limits bilaterally.  Prominent posterior communicating arteries are present bilaterally.  The A1 and M1 segments are normal.  The MCA bifurcations are within normal limits.  The anterior communicating artery is patent.  There is some attenuation of MCA branch vessels bilaterally.  The  right vertebral artery is the dominant vessel.  There is segmental signal loss within the smaller left vertebral artery. The right PICA is visualized and normal.  The basilar artery is within normal limits.  The basilar  artery terminates at the superior cerebellar arteries with fetal type posterior cerebral arteries bilaterally.  There is some attenuation of distal PCA branch vessels.  IMPRESSION:  1.  Mild to moderate small vessel disease. 2.  Segmental signal loss within the left vertebral artery suggesting a significant focal stenosis or perhaps dissection. 3.  Fetal type posterior cerebral arteries. 4.  Moderate small vessel disease.   Original Report Authenticated By: San Morelle, M.D.      Assessment/Plan: 67 years old male with acute left inferior cerebellar infarct. Admit to stroke service to complete stroke work up. Start aspirin 81 mg and statins, pending results of stroke work up.   Dorian Pod, MD [facility] [phone]  01/21/2013, 6:41 PM

## 2013-01-21 NOTE — ED Notes (Signed)
PT denies visual changes at this time. Reports that while driving on Wednesday his vision "kinda went out." Pt pulled to side of road and it resolved. Denies similar episodes since then. AO x4.

## 2013-01-22 DIAGNOSIS — M109 Gout, unspecified: Secondary | ICD-10-CM

## 2013-01-22 DIAGNOSIS — E785 Hyperlipidemia, unspecified: Secondary | ICD-10-CM

## 2013-01-22 LAB — GLUCOSE, CAPILLARY
Glucose-Capillary: 103 mg/dL — ABNORMAL HIGH (ref 70–99)
Glucose-Capillary: 103 mg/dL — ABNORMAL HIGH (ref 70–99)
Glucose-Capillary: 155 mg/dL — ABNORMAL HIGH (ref 70–99)
Glucose-Capillary: 171 mg/dL — ABNORMAL HIGH (ref 70–99)

## 2013-01-22 LAB — LIPID PANEL
LDL Cholesterol: 93 mg/dL (ref 0–99)
VLDL: 36 mg/dL (ref 0–40)

## 2013-01-22 LAB — HEMOGLOBIN A1C: Hgb A1c MFr Bld: 6.8 % — ABNORMAL HIGH (ref <5.7)

## 2013-01-22 MED ORDER — SIMVASTATIN 20 MG PO TABS
20.0000 mg | ORAL_TABLET | Freq: Every day | ORAL | Status: DC
  Administered 2013-01-22: 20 mg via ORAL
  Filled 2013-01-22 (×2): qty 1

## 2013-01-22 MED ORDER — AMLODIPINE BESYLATE 10 MG PO TABS
10.0000 mg | ORAL_TABLET | Freq: Every day | ORAL | Status: DC
  Administered 2013-01-22 – 2013-01-23 (×2): 10 mg via ORAL
  Filled 2013-01-22 (×2): qty 1

## 2013-01-22 MED ORDER — ASPIRIN 81 MG PO CHEW
81.0000 mg | CHEWABLE_TABLET | Freq: Every day | ORAL | Status: DC
  Administered 2013-01-22 – 2013-01-23 (×2): 81 mg via ORAL
  Filled 2013-01-22 (×2): qty 1

## 2013-01-22 NOTE — Progress Notes (Signed)
VASCULAR LAB PRELIMINARY  PRELIMINARY  PRELIMINARY  PRELIMINARY  Carotid Dopplers completed.    Preliminary report:  There is no ICA stenosis.  Vertebral artery flow is antegrade.  Shala Baumbach, RVT 01/22/2013, 12:13 PM

## 2013-01-22 NOTE — Evaluation (Signed)
Physical Therapy Evaluation Patient Details Name: Jared [name] MRN: [id number] DOB: [birth year] Today's Date: 01/22/2013 Time: [id number] PT Time Calculation (min): 23 min  PT Assessment / Plan / Recommendation Clinical Impression  Pt s/p HTN with L cerebellar infarct and vertigo.  Found that pt testing suggesting left vestibular hypofunction with this PT intiating x1 exercises.  Pt will need a cane and 3 N1 for home use.  Also, Outpt PT for vestibular rehab recommended.      PT Assessment  Patient needs continued PT services    Follow Up Recommendations  Outpatient PT;Supervision - Intermittent (for vestibular rehab)        Barriers to Discharge Decreased caregiver support      Equipment Recommendations  Cane         Frequency Min 4X/week    Precautions / Restrictions Precautions Precautions: Fall Restrictions Weight Bearing Restrictions: No   Pertinent Vitals/Pain VSS, No pain      Mobility  Bed Mobility Bed Mobility: Rolling Right;Right Sidelying to Sit Rolling Right: 7: Independent Right Sidelying to Sit: 7: Independent Transfers Transfers: Sit to Stand;Stand to Sit Sit to Stand: 4: Min assist;With upper extremity assist;With armrests;From chair/3-in-1 Stand to Sit: 4: Min assist;With upper extremity assist;With armrests;To chair/3-in-1 Details for Transfer Assistance: Significant posterior LOB when pt trying to get up from low recliner needing steadying assist.  Assessed vestibular system as he reports some spinning.  Positive for left head thrust suggesting a left hypofunction.  Initiated x1 exercises with pt in understanding and gave handouts.   Ambulation/Gait Ambulation/Gait Assistance: 4: Min assist Ambulation Distance (Feet): 380 Feet Assistive device: None Ambulation/Gait Assistance Details: Pt with fairly steady gait in controlled environment.  Cannot accept challenges.  Will do well with cane on d/c.  Pt does have slight decr hip and knee flexion on the  left affecting his gait pattern.   Gait Pattern: Step-through pattern;Decreased hip/knee flexion - left;Decreased stance time - right Gait velocity: decreased Stairs: No Wheelchair Mobility Wheelchair Mobility: No         Exercises Other Exercises Other Exercises: x1 exercises sitting and standing (has to be guarded) with head side to side and up and down - gave handout   PT Diagnosis: Generalized weakness  PT Problem List: Decreased balance;Decreased mobility;Decreased activity tolerance;Decreased knowledge of use of DME;Decreased safety awareness;Decreased knowledge of precautions PT Treatment Interventions: DME instruction;Gait training;Stair training;Functional mobility training;Therapeutic activities;Therapeutic exercise;Balance training;Patient/family education   PT Goals Acute Rehab PT Goals PT Goal Formulation: With patient Potential to Achieve Goals: Good Pt will go Supine/Side to Sit: Independently PT Goal: Supine/Side to Sit - Progress: Goal set today Pt will go Sit to Stand: Independently PT Goal: Sit to Stand - Progress: Goal set today Pt will Ambulate: 51 - 150 feet;with modified independence;with least restrictive assistive device PT Goal: Ambulate - Progress: Goal set today Pt will Go Up / Down Stairs: 3-5 stairs;with modified independence;with least restrictive assistive device PT Goal: Up/Down Stairs - Progress: Goal set today Pt will Perform Home Exercise Program: with supervision, verbal cues required/provided (x1 exercises) PT Goal: Perform Home Exercise Program - Progress: Goal set today  Visit Information  Last PT Received On: 01/22/13 Assistance Needed: +1    Subjective Data  Subjective: "I feel like my left arm is weaker and it hurts." Patient Stated Goal: To go home   Prior [place] Lives With: Spouse;Daughter Available Help at Discharge: Available PRN/intermittently (wife works and daughter school) Type of Home: House Home Access:  Stairs to enter [employer] of Steps: 3 Entrance Stairs-Rails: None Home Layout: One level Bathroom Shower/Tub: Multimedia programmer: Standard Home Adaptive Equipment: None Prior Function Level of Independence: Independent Able to Take Stairs?: Yes Driving: Yes Vocation: Full time employment 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 Dominant Hand: Right    Cognition  Overall Cognitive Status: Appears within functional limits for tasks assessed/performed Arousal/Alertness: Awake/alert Orientation Level: Appears intact for tasks assessed Behavior During Session: [facility] for tasks performed    Extremity/Trunk Assessment Right Lower Extremity Assessment RLE ROM/Strength/Tone: [facility] for tasks assessed Left Lower Extremity Assessment LLE ROM/Strength/Tone: [facility] for tasks assessed   Balance Standardized Balance Assessment Standardized Balance Assessment: Dynamic Gait Index Dynamic Gait Index Level Surface: Mild Impairment Change in Gait Speed: Mild Impairment Gait with Horizontal Head Turns: Normal Gait with Vertical Head Turns: Mild Impairment Gait and Pivot Turn: Normal Step Over Obstacle: Mild Impairment Step Around Obstacles: Normal Steps: Mild Impairment Total Score: 19  High Level Balance High Level Balance Comments: Pt scored a 19/24 on DGI suggestive of low risk for falls but there is some risk given his deficits from CVA.  Recommend pt to use cane for incr stability.   End of Session PT - End of Session Equipment Utilized During Treatment: Gait belt Activity Tolerance: Patient tolerated treatment well Patient left: in chair;with call bell/phone within reach Nurse Communication: Mobility status      INGOLD,Shrita Thien 01/22/2013, 12:37 PM  [facility] Acute Rehabilitation [phone] (pager)

## 2013-01-22 NOTE — Progress Notes (Signed)
[facility] PROGRESS NOTE  Jared [name] [id number] DOB: 20-Jul-1946 DOA: 01/21/2013 PCP: Georgetta Haber, MD  Assessment/Plan: 1-Acute/subacute left cerebellum infarct: Patient with risk factors for stroke including hypertension, hyperlipidemia, diabetes. Prior to admission was not taking aspirin. -Finish CVA workup (carotid dopplers, 2-D echo) -Aspirin for secondary prevention -Resector modifications (blood pressure control, control his diabetes, start statins) -Will follow neurology recommendations. -PT/OT evaluation and treatment  2-hypertension and: Will be permissive with hypertensive episodes in order to guarantee blood perfusion to affected brain area. Slow adjustment to be done for better control his blood pressure overall.  3-diabetes: Will check hemoglobin A1c. Continue sliding scale insulin while in the hospital.  4-hyperlipidemia: Started on statins.  5-Gout: No acute flares. Continue uloric.  6-anxiety: Stable. Continue Valium as needed  DVT: SCDs.  Code Status: Full Family Communication: wife at bedside Disposition Plan: home when medically stable   Consultants:  neurology  Procedures:  See below for x-ray reports  Antibiotics:  none  HPI/Subjective: Afebrile; reports intermittent diplopia and also some dizziness  Objective: Filed Vitals:   01/22/13 0124 01/22/13 0330 01/22/13 0530 01/22/13 0800  BP: 168/97 161/93 166/96 178/116  Pulse: 92 88 89 91  Temp: 98.4 F (36.9 C) 98.8 F (37.1 C) 98.5 F (36.9 C) 98.3 F (36.8 C)  TempSrc: Oral  Oral Oral  Resp: 18 18 18 18   Height:      Weight:      SpO2: 98% 98% 98% 96%   No intake or output data in the 24 hours ending 01/22/13 0841 Filed Weights   01/21/13 2323  Weight: 105.507 kg (232 lb 9.6 oz)    Exam:   General:  NAD, still feeling dizzy and with intermittent episodes of diplopia  Cardiovascular: S1 and S2, no rubs or gallops  Respiratory: CTA  Abdomen: soft, NT,  ND, positive BS  Neuro: no focal motor deficit, no dysarthria, tongue midline, no uvula deviation; EOMI.  Data Reviewed: Basic Metabolic Panel:  Lab 0000000 1535 01/19/13 1835  NA 137 138  K 4.1 4.1  CL 100 103  CO2 26 25  GLUCOSE 115* 176*  BUN 18 23  CREATININE 1.89* 2.14*  CALCIUM 9.5 9.3  MG -- --  PHOS -- --   Liver Function Tests:  Lab 01/21/13 1535 01/19/13 1835  AST 20 21  ALT 22 21  ALKPHOS 101 91  BILITOT 0.6 0.4  PROT 7.6 7.0  ALBUMIN 3.8 4.2   CBC:  Lab 01/21/13 1535  WBC 7.5  NEUTROABS 4.9  HGB 13.9  HCT 41.6  MCV 85.1  PLT 225   Cardiac Enzymes:  Lab 01/21/13 1535  CKTOTAL --  CKMB --  CKMBINDEX --  TROPONINI <0.30   CBG:  Lab 01/22/13 0810 01/22/13 0034  GLUCAP 171* 155*    Studies: Ct Head Wo Contrast  01/21/2013  *RADIOLOGY REPORT*  Clinical Data: Hypertension, loss of vision, dizziness  CT HEAD WITHOUT CONTRAST  Technique:  Contiguous axial images were obtained from the base of the skull through the vertex without contrast.  Comparison: None.  Findings:  Scattered very minimal periventricular hypodensities suggestive of microvascular ischemic disease.  Gray white differentiation is otherwise well maintained.  No CT evidence of acute large territory infarct.  No intraparenchymal or extra-axial mass or hemorrhage. There is mild diffuse increased attenuation of the intracranial blood pool suggestive of volume depletion. Normal size and configuration of the ventricles and basilar cisterns.  No midline shift.  Limited visualization of the paranasal sinuses and  mastoid air cells are normal.  Regional soft tissues are normal.  No displaced calvarial fracture.  IMPRESSION: Minimal microvascular ischemic disease without acute intracranial process.   Original Report Authenticated By: Jake Seats, MD    Mr [name] Wo Contrast  01/21/2013  *RADIOLOGY REPORT*  Clinical Data:  Dizziness.  Stroke.  MRI HEAD WITHOUT CONTRAST MRA HEAD WITHOUT CONTRAST   Technique:  Multiplanar, multiecho pulse sequences of the brain and surrounding structures were obtained without intravenous contrast. Angiographic images of the head were obtained using MRA technique without contrast.  Comparison:  CT head without contrast 01/22/2012.  MRI HEAD  Findings:  The diffusion weighted images demonstrate a 6 mm focal infarct in the inferior left cerebellum.  There is no associated hemorrhage.  T2 signal is associated.  Minimal atrophy white matter disease is otherwise within normal limits for age.  No hemorrhage or mass lesion is present.  Abnormal signal is present within the left vertebral artery suggesting slow flow.  The basilar artery is within normal limits.  The anterior circulation is unremarkable.  The globes and orbits are intact.  The paranasal sinuses and mastoid air cells are clear.  IMPRESSION:  1.  Acute / subacute 6 mm non hemorrhagic infarct in the inferior left cerebellum. 2.  Abnormal signal within the left vertebral artery compatible with slow or occluded flow.  MRA HEAD  Findings: The internal carotid arteries are within normal limits bilaterally.  Prominent posterior communicating arteries are present bilaterally.  The A1 and M1 segments are normal.  The MCA bifurcations are within normal limits.  The anterior communicating artery is patent.  There is some attenuation of MCA branch vessels bilaterally.  The right vertebral artery is the dominant vessel.  There is segmental signal loss within the smaller left vertebral artery. The right PICA is visualized and normal.  The basilar artery is within normal limits.  The basilar artery terminates at the superior cerebellar arteries with fetal type posterior cerebral arteries bilaterally.  There is some attenuation of distal PCA branch vessels.  IMPRESSION:  1.  Mild to moderate small vessel disease. 2.  Segmental signal loss within the left vertebral artery suggesting a significant focal stenosis or perhaps dissection. 3.   Fetal type posterior cerebral arteries. 4.  Moderate small vessel disease.   Original Report Authenticated By: San Morelle, M.D.    Mr Brain Wo Contrast  01/21/2013  *RADIOLOGY REPORT*  Clinical Data:  Dizziness.  Stroke.  MRI HEAD WITHOUT CONTRAST MRA HEAD WITHOUT CONTRAST  Technique:  Multiplanar, multiecho pulse sequences of the brain and surrounding structures were obtained without intravenous contrast. Angiographic images of the head were obtained using MRA technique without contrast.  Comparison:  CT head without contrast 01/22/2012.  MRI HEAD  Findings:  The diffusion weighted images demonstrate a 6 mm focal infarct in the inferior left cerebellum.  There is no associated hemorrhage.  T2 signal is associated.  Minimal atrophy white matter disease is otherwise within normal limits for age.  No hemorrhage or mass lesion is present.  Abnormal signal is present within the left vertebral artery suggesting slow flow.  The basilar artery is within normal limits.  The anterior circulation is unremarkable.  The globes and orbits are intact.  The paranasal sinuses and mastoid air cells are clear.  IMPRESSION:  1.  Acute / subacute 6 mm non hemorrhagic infarct in the inferior left cerebellum. 2.  Abnormal signal within the left vertebral artery compatible with slow or occluded flow.  MRA HEAD  Findings: The internal carotid arteries are within normal limits bilaterally.  Prominent posterior communicating arteries are present bilaterally.  The A1 and M1 segments are normal.  The MCA bifurcations are within normal limits.  The anterior communicating artery is patent.  There is some attenuation of MCA branch vessels bilaterally.  The right vertebral artery is the dominant vessel.  There is segmental signal loss within the smaller left vertebral artery. The right PICA is visualized and normal.  The basilar artery is within normal limits.  The basilar artery terminates at the superior cerebellar arteries with  fetal type posterior cerebral arteries bilaterally.  There is some attenuation of distal PCA branch vessels.  IMPRESSION:  1.  Mild to moderate small vessel disease. 2.  Segmental signal loss within the left vertebral artery suggesting a significant focal stenosis or perhaps dissection. 3.  Fetal type posterior cerebral arteries. 4.  Moderate small vessel disease.   Original Report Authenticated By: San Morelle, M.D.     Scheduled Meds:   . sodium chloride   Intravenous STAT  . amLODipine  10 mg Oral Daily  . aspirin  300 mg Rectal Daily   Or  . aspirin  325 mg Oral Daily  . febuxostat  80 mg Oral Daily  . insulin aspart  0-5 Units Subcutaneous QHS  . insulin aspart  0-9 Units Subcutaneous TID WC  . irbesartan  150 mg Oral Daily  . labetalol  200 mg Oral BID  . simvastatin  20 mg Oral q1800   Continuous Infusions:   . sodium chloride 75 mL/hr at 01/22/13 0010   Time spent: >30 minutes    Makelle Marrone  [facility] Pager [phone]. If 8PM-8AM, please contact night-coverage at [email or web address], password [facility] 01/22/2013, 8:41 AM  LOS: 1 day

## 2013-01-22 NOTE — Progress Notes (Addendum)
Stroke Team Progress Note  HISTORY   Patient was not a TPA candidate secondary to not in TPA window due to timing.   SUBJECTIVE Jared [name] is an 67 y.o. male with a past medical history significant for hypertension, hypercholesterolemia, and osteoarthritis, who in the past couple of days has been experiencing intermittent episodes characterized by lightheadedness, room spinning, double vision, and imbalance that typically last for several minutes and completely resolve.  There is no associated headache, difficulty swallowing, focal weakness or numbness, slurred speech, or confusion.  He expressed that the first episode occurred while he was driving and he had to pull over because he couldn't see well.  Never had similar episodes before.  He presented to the ED for further evaluation and was noted to have significantly elevated blood pressure.  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 MRA brain did not shoed significant intracranial arterial disease. Pt's symptoms have significantly improved since admission.    OBJECTIVE Most recent Vital Signs: Filed Vitals:   01/22/13 0124 01/22/13 0330 01/22/13 0530 01/22/13 0800  BP: 168/97 161/93 166/96 178/116  Pulse: 92 88 89 91  Temp: 98.4 F (36.9 C) 98.8 F (37.1 C) 98.5 F (36.9 C) 98.3 F (36.8 C)  TempSrc: Oral  Oral Oral  Resp: 18 18 18 18   Height:      Weight:      SpO2: 98% 98% 98% 96%   CBG (last 3)   Basename 01/22/13 0810 01/22/13 0034  GLUCAP 171* 155*    IV Fluid Intake:     . sodium chloride 75 mL/hr at 01/22/13 0010    MEDICATIONS    . sodium chloride   Intravenous STAT  . amLODipine  10 mg Oral Daily  . aspirin  81 mg Oral Daily  . febuxostat  80 mg Oral Daily  . insulin aspart  0-5 Units Subcutaneous QHS  . insulin aspart  0-9 Units Subcutaneous TID WC  . irbesartan  150 mg Oral Daily  . labetalol  200 mg Oral BID  .  simvastatin  20 mg Oral q1800   PRN:  acetaminophen, acetaminophen, diazepam, hydrALAZINE, ondansetron (ZOFRAN) IV, senna-docusate  Diet:  Carb Control  Activity:   Up with assistance DVT Prophylaxis: SCDs  CLINICALLY SIGNIFICANT STUDIES Basic Metabolic Panel:  Lab 0000000 1535 01/19/13 1835  NA 137 138  K 4.1 4.1  CL 100 103  CO2 26 25  GLUCOSE 115* 176*  BUN 18 23  CREATININE 1.89* 2.14*  CALCIUM 9.5 9.3  MG -- --  PHOS -- --   Liver Function Tests:  Lab 01/21/13 1535 01/19/13 1835  AST 20 21  ALT 22 21  ALKPHOS 101 91  BILITOT 0.6 0.4  PROT 7.6 7.0  ALBUMIN 3.8 4.2   CBC:  Lab 01/21/13 1535  WBC 7.5  NEUTROABS 4.9  HGB 13.9  HCT 41.6  MCV 85.1  PLT 225   Coagulation: No results found for this basename: LABPROT:4,INR:4 in the last 168 hours Cardiac Enzymes:  Lab 01/21/13 1535  CKTOTAL --  CKMB --  CKMBINDEX --  TROPONINI <0.30   Urinalysis:  Lab 01/19/13 1836  COLORURINE --  LABSPEC --  PHURINE --  GLUCOSEU --  HGBUR --  BILIRUBINUR neg  KETONESUR --  PROTEINUR --  UROBILINOGEN 0.2  NITRITE neg  LEUKOCYTESUR Negative   Lipid Panel    Component Value Date/Time   CHOL 157  01/22/2013 0530   TRIG 181* 01/22/2013 0530   HDL 28* 01/22/2013 0530   CHOLHDL 5.6 01/22/2013 0530   VLDL 36 01/22/2013 0530   LDLCALC 93 01/22/2013 0530   HgbA1C  Lab Results  Component Value Date   HGBA1C 6.4 01/19/2013    Urine Drug Screen:   No results found for this basename: labopia, cocainscrnur, labbenz, amphetmu, thcu, labbarb    Alcohol Level: No results found for this basename: ETH:2 in the last 168 hours  Ct Head Wo Contrast  01/21/2013  *RADIOLOGY REPORT*  Clinical Data: Hypertension, loss of vision, dizziness  CT HEAD WITHOUT CONTRAST  Technique:  Contiguous axial images were obtained from the base of the skull through the vertex without contrast.  Comparison: None.  Findings:  Scattered very minimal periventricular hypodensities suggestive of microvascular  ischemic disease.  Gray white differentiation is otherwise well maintained.  No CT evidence of acute large territory infarct.  No intraparenchymal or extra-axial mass or hemorrhage. There is mild diffuse increased attenuation of the intracranial blood pool suggestive of volume depletion. Normal size and configuration of the ventricles and basilar cisterns.  No midline shift.  Limited visualization of the paranasal sinuses and mastoid air cells are normal.  Regional soft tissues are normal.  No displaced calvarial fracture.  IMPRESSION: Minimal microvascular ischemic disease without acute intracranial process.   Original Report Authenticated By: Jake Seats, MD    Mr [facility] Wo Contrast  01/21/2013  *RADIOLOGY REPORT*  Clinical Data:  Dizziness.  Stroke.  MRI HEAD WITHOUT CONTRAST MRA HEAD WITHOUT CONTRAST  Technique:  Multiplanar, multiecho pulse sequences of the brain and surrounding structures were obtained without intravenous contrast. Angiographic images of the head were obtained using MRA technique without contrast.  Comparison:  CT head without contrast 01/22/2012.  MRI HEAD  Findings:  The diffusion weighted images demonstrate a 6 mm focal infarct in the inferior left cerebellum.  There is no associated hemorrhage.  T2 signal is associated.  Minimal atrophy white matter disease is otherwise within normal limits for age.  No hemorrhage or mass lesion is present.  Abnormal signal is present within the left vertebral artery suggesting slow flow.  The basilar artery is within normal limits.  The anterior circulation is unremarkable.  The globes and orbits are intact.  The paranasal sinuses and mastoid air cells are clear.  IMPRESSION:  1.  Acute / subacute 6 mm non hemorrhagic infarct in the inferior left cerebellum. 2.  Abnormal signal within the left vertebral artery compatible with slow or occluded flow.  MRA HEAD  Findings: The internal carotid arteries are within normal limits bilaterally.  Prominent  posterior communicating arteries are present bilaterally.  The A1 and M1 segments are normal.  The MCA bifurcations are within normal limits.  The anterior communicating artery is patent.  There is some attenuation of MCA branch vessels bilaterally.  The right vertebral artery is the dominant vessel.  There is segmental signal loss within the smaller left vertebral artery. The right PICA is visualized and normal.  The basilar artery is within normal limits.  The basilar artery terminates at the superior cerebellar arteries with fetal type posterior cerebral arteries bilaterally.  There is some attenuation of distal PCA branch vessels.  IMPRESSION:  1.  Mild to moderate small vessel disease. 2.  Segmental signal loss within the left vertebral artery suggesting a significant focal stenosis or perhaps dissection. 3.  Fetal type posterior cerebral arteries. 4.  Moderate small vessel disease.  Original Report Authenticated By: San Morelle, M.D.    Mr Brain Wo Contrast  01/21/2013  *RADIOLOGY REPORT*  Clinical Data:  Dizziness.  Stroke.  MRI HEAD WITHOUT CONTRAST MRA HEAD WITHOUT CONTRAST  Technique:  Multiplanar, multiecho pulse sequences of the brain and surrounding structures were obtained without intravenous contrast. Angiographic images of the head were obtained using MRA technique without contrast.  Comparison:  CT head without contrast 01/22/2012.  MRI HEAD  Findings:  The diffusion weighted images demonstrate a 6 mm focal infarct in the inferior left cerebellum.  There is no associated hemorrhage.  T2 signal is associated.  Minimal atrophy white matter disease is otherwise within normal limits for age.  No hemorrhage or mass lesion is present.  Abnormal signal is present within the left vertebral artery suggesting slow flow.  The basilar artery is within normal limits.  The anterior circulation is unremarkable.  The globes and orbits are intact.  The paranasal sinuses and mastoid air cells are clear.   IMPRESSION:  1.  Acute / subacute 6 mm non hemorrhagic infarct in the inferior left cerebellum. 2.  Abnormal signal within the left vertebral artery compatible with slow or occluded flow.  MRA HEAD  Findings: The internal carotid arteries are within normal limits bilaterally.  Prominent posterior communicating arteries are present bilaterally.  The A1 and M1 segments are normal.  The MCA bifurcations are within normal limits.  The anterior communicating artery is patent.  There is some attenuation of MCA branch vessels bilaterally.  The right vertebral artery is the dominant vessel.  There is segmental signal loss within the smaller left vertebral artery. The right PICA is visualized and normal.  The basilar artery is within normal limits.  The basilar artery terminates at the superior cerebellar arteries with fetal type posterior cerebral arteries bilaterally.  There is some attenuation of distal PCA branch vessels.  IMPRESSION:  1.  Mild to moderate small vessel disease. 2.  Segmental signal loss within the left vertebral artery suggesting a significant focal stenosis or perhaps dissection. 3.  Fetal type posterior cerebral arteries. 4.  Moderate small vessel disease.   Original Report Authenticated By: San Morelle, M.D.     CT of the brain  Minimal microvascular ischemic disease without acute intracranial  process.  MRI of the brain Acute / subacute 6 mm non hemorrhagic infarct in the inferior  left cerebellum.  MRA of the brain  Mild to moderate small vessel disease. Fetal PCA no hemodynamic stenosis.    2D Echocardiogram    Carotid Doppler    CXR    Physical Exam   Mental Status:  Alert, oriented, thought content appropriate. Speech fluent without evidence of aphasia. Able to follow 3 step commands without difficulty.  Cranial Nerves:  II: Discs flat bilaterally; Visual fields grossly normal, pupils equal, round, reactive to light and accommodation  III,IV, VI: ptosis not present,  extra-ocular motions intact bilaterally  V,VII: smile symmetric, facial light touch sensation normal bilaterally  VIII: hearing normal bilaterally  IX,X: gag reflex present  XI: bilateral shoulder shrug  XII: midline tongue extension  Motor:  Right : Upper extremity 5/5 Left: Upper extremity 5/5  Lower extremity 5/5 Lower extremity 5/5  Tone and bulk:normal tone throughout; no atrophy noted  Sensory: Pinprick and light touch intact throughout, bilaterally  Deep Tendon Reflexes: 2+ and symmetric throughout  Plantars:  Right: downgoing Left: downgoing  Cerebellar:  Much improved dysmetria on L side and minimal difficulty with heal to shin.  Gait: needs assistance states feels as he is being pulled to L side.  CV: pulses palpable throughout    ASSESSMENT Jared [name] is an 67 y.o. male with a past medical history significant for hypertension, hypercholesterolemia, and osteoarthritis, who in the past couple of days has been experiencing intermittent episodes characterized by lightheadedness, room spinning, double vision, and imbalance that typically last for several minutes and completely resolve.  There is no associated headache, difficulty swallowing, focal weakness or numbness, slurred speech, or confusion.  He expressed that the first episode occurred while he was driving and he had to pull over because he couldn't see well.  Never had similar episodes before.  He presented to the ED for further evaluation and was noted to have significantly elevated blood pressure.   MRI that revealed an acute infarction 6 mm in the left inferior cerebellum. MRA brain did not shoed significant intracranial arterial disease. Pt's symptoms have significantly improved since admission.  Able to ambulate with minimal assistance    Hospital day # 1  TREATMENT/PLAN  Pt was not on antiplatelet at home. Please increase ASA 325  On SCDs for DVT prophylaxis. If he is going to be in bed today should be  started on heparin or lovenox.   Statin  2Decho  carotid doppler  Blood pressure control. Can hyper perfuse to 220/110 for 24 hrs post stroke, but now can start treating it with goal SBP of <180  Pt/Ot  Pt has a diet.    Elevated Lipid panel increase zocor to 40 daily   HbA1C, sliding scale  Aseret Hoffman   01/22/2013 9:15 AM  I have personally obtained a history, examined the patient, evaluated imaging results, and formulated the assessment and plan of care. I agree with the above.

## 2013-01-22 NOTE — Progress Notes (Signed)
  Echocardiogram 2D Echocardiogram has been performed.  Jared [name] 01/22/2013, 11:50 AM

## 2013-01-23 ENCOUNTER — Inpatient Hospital Stay (HOSPITAL_COMMUNITY): Payer: BC Managed Care – PPO

## 2013-01-23 LAB — BASIC METABOLIC PANEL
Chloride: 103 mEq/L (ref 96–112)
Creatinine, Ser: 1.82 mg/dL — ABNORMAL HIGH (ref 0.50–1.35)
GFR calc Af Amer: 44 mL/min — ABNORMAL LOW (ref >90)
GFR calc non Af Amer: 38 mL/min — ABNORMAL LOW (ref >90)
Potassium: 4.1 mEq/L (ref 3.5–5.1)

## 2013-01-23 LAB — CBC
MCHC: 34.2 g/dL (ref 30.0–36.0)
Platelets: 211 10*3/uL (ref 150–400)
RDW: 13.8 % (ref 11.5–15.5)
WBC: 7.9 10*3/uL (ref 4.0–10.5)

## 2013-01-23 LAB — GLUCOSE, CAPILLARY: Glucose-Capillary: 126 mg/dL — ABNORMAL HIGH (ref 70–99)

## 2013-01-23 MED ORDER — AMLODIPINE BESYLATE 10 MG PO TABS: 10.0000 mg | ORAL_TABLET | Freq: Every day | ORAL | Status: DC

## 2013-01-23 MED ORDER — SIMVASTATIN 20 MG PO TABS: 20.0000 mg | ORAL_TABLET | Freq: Every day | ORAL | Status: DC

## 2013-01-23 MED ORDER — IRBESARTAN 300 MG PO TABS: 150.0000 mg | ORAL_TABLET | Freq: Every day | ORAL | Status: DC

## 2013-01-23 MED ORDER — ASPIRIN EC 325 MG PO TBEC: 325.0000 mg | DELAYED_RELEASE_TABLET | Freq: Every day | ORAL | Status: DC

## 2013-01-23 MED ORDER — STROKE: EARLY STAGES OF RECOVERY BOOK
Freq: Once | Status: AC
  Administered 2013-01-23: 03:00:00
  Filled 2013-01-23: qty 1

## 2013-01-23 MED ORDER — LABETALOL HCL 200 MG PO TABS
200.0000 mg | ORAL_TABLET | Freq: Three times a day (TID) | ORAL | Status: DC
  Administered 2013-01-23 (×2): 200 mg via ORAL
  Filled 2013-01-23 (×3): qty 1

## 2013-01-23 MED ORDER — INDOMETHACIN 50 MG PO CAPS: 50.0000 mg | ORAL_CAPSULE | Freq: Three times a day (TID) | ORAL | Status: DC | PRN

## 2013-01-23 NOTE — Discharge Summary (Signed)
Physician Discharge Summary  RODRICKUS STEIG [id number] DOB: Feb 14, 1946 DOA: 01/21/2013  PCP: Georgetta Haber, MD  Admit date: 01/21/2013 Discharge date: 01/23/2013  Time spent: >30 minutes  Recommendations for Outpatient Follow-up:  1. BMET to follow electrolytes and kidney function 2. Ortho follow up for left shoulder pain 3. Needs reevaluation of BP and medication adjustments 4. Also LFT's repeated in 3 months along with lipid panel  Discharge Diagnoses:  Active Problems:  HYPERTENSION  CVA (cerebral infarction)  Diabetes mellitus HLD Left shoulder pain Gout CKD (stage II-III)  Discharge Condition: stable; with complaints of some mild dizzy spells especially abrupt movements. Will require vestibular rehabilitation as an outpatient and also HHOT.   Diet recommendation: low sodium, low fat and low carbohydrates diet  Filed Weights   01/21/13 2323  Weight: 105.507 kg (232 lb 9.6 oz)    History of present illness:  67 y.o. male with a past medical history significant for hypertension, hypercholesterolemia, and osteoarthritis, who in the past couple of days has been experiencing intermittent episodes characterized by lightheadedness, room spinning, double vision, and imbalance that typically last for several minutes and completely resolve.  There is no associated headache, difficulty swallowing, focal weakness or numbness, slurred speech, or confusion.  He expressed that the first episode occurred while he was driving and he had to pull over because he couldn't see well.  Never had similar episodes before.  He presented to the ED for further evaluation and was noted to have significantly elevated blood pressure.  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    Hospital Course:  1-Acute/subacute left cerebellum infarct: Patient with risk factors for stroke including hypertension,  hyperlipidemia, diabetes. Prior to admission was not taking aspirin.  -Aspirin for secondary prevention  -Risk factors modifications (better control of his BP, diabetes and cholesterol)  -Will arrange for [facility] OT and vestibular training/rehabilitation as an outpatient.   2-hypertension:medications has been adjusted for better control. Will recommend low sodium diet, medication compliance and follow up with PCP for further adjustments.  3-diabetes: Hemoglobin A1c 6.8; will discharge on metformin.   4-hyperlipidemia: Started on statins. zocor 20mg  daily; advised to follow low fat diet  5-Gout: No acute flares. Continue uloric. Will benefit of allopurinol, as uloric is to expensive for him and is making him not to be compliant with daily medication.  6-anxiety: Stable. Continue Valium as needed  7-Left shoulder pain: shoulder x-ray done during this admission demonstrated mild degenerative changes. Family will arrange follow up visit with ortho service for outpatient evaluation and treatment.  8-CKD: Cr remains stable and at baseline. Close follow up as an outpatient.  Rest of medical problems remains stable; will continue current medication regimen.   Procedures:  See below for xray reports  2-D echo (demonstarted no wall motion abnormalities, preserved EF and grade 1 diastolic dysfunction)  Carotid dopplers: There is no ICA stenosis. Vertebral artery flow is antegrade.   Consultations:  Neurology  PT/OT  Discharge Exam: Filed Vitals:   01/22/13 1200 01/22/13 2100 01/23/13 0007 01/23/13 0356  BP: 154/90 168/100 159/81 164/100  Pulse: 89 93 81 90  Temp: 98.9 F (37.2 C) 99.2 F (37.3 C) 99.3 F (37.4 C) 98.7 F (37.1 C)  TempSrc: Oral     Resp: 18 18 20 18   Height:      Weight:      SpO2: 98% 94% 96% 96%    General:  NAD, no CP, no SOB. Patient still with some dizzy spells especially with abrupt movements. Cardiovascular: S1 and S2, no rubs or gallops Respiratory: CTA  bilaterally Abdomen: soft, NT, ND, positive BS Neuro:CN intact, Right : Upper extremity 5/5 Left: Upper extremity 4/5 - proximal weakness mostly due to pain.  Lower extremity 5/5 Lower extremity 5/5, Tone and bulk:normal tone throughout; no atrophy noted  Sensory: Pinprick and light touch intact throughout, bilaterally  Deep Tendon Reflexes: 2+ and symmetric throughout  Plantars:  Right: downgoing Left: downgoing  Cerebellar:  Much improved dysmetria on L side and minimal difficulty with heal to shin.    Discharge Instructions  Discharge Orders    Future Orders Please Complete By Expires   Diet - low sodium heart healthy      Home Health      Scheduling Instructions:   New stroke; needs OT at home for evaluation of ADL's   Questions: Responses:   To provide the following care/treatments OT   Increase activity slowly      Discharge instructions      Comments:   -Follow a low sodium and low carbohydrates diet -take medications as prescribed -arrange follow up with PCP in 2 weeks -Physical therapy as an outpatient for vestibular training   PT eval and treat   01/23/14   Scheduling Instructions:   Physical therapy for vestibular training       Medication List     As of 01/23/2013 11:08 AM    STOP taking these medications         amLODipine-valsartan 5-160 MG per tablet   Commonly known as: EXFORGE      TAKE these medications         amLODipine 10 MG tablet   Commonly known as: NORVASC   Take 1 tablet (10 mg total) by mouth daily.      diazepam 5 MG tablet   Commonly known as: VALIUM   Take 5 mg by mouth every 6 (six) hours as needed. For anxiety      furosemide 40 MG tablet   Commonly known as: LASIX   Take 40 mg by mouth daily.      indomethacin 50 MG capsule   Commonly known as: INDOCIN   Take 1 capsule (50 mg total) by mouth 3 (three) times daily with meals as needed (gouty pain).      irbesartan 300 MG tablet   Commonly known as: AVAPRO   Take 0.5 tablets  (150 mg total) by mouth daily.      labetalol 200 MG tablet   Commonly known as: NORMODYNE   Take 200 mg by mouth 2 (two) times daily.      metFORMIN 500 MG tablet   Commonly known as: GLUCOPHAGE   Take 500 mg by mouth 2 (two) times daily with a meal.      multivitamin with minerals Tabs   Take 1 tablet by mouth daily.      simvastatin 20 MG tablet   Commonly known as: ZOCOR   Take 1 tablet (20 mg total) by mouth daily at 6 PM.      ULORIC 80 MG Tabs   Generic drug: Febuxostat   Take 80 mg by mouth daily.           Follow-up Information    Follow up with Georgetta Haber, MD. Schedule an appointment as soon as possible for a visit in 2 weeks.   Contact information:   [place]  [place] [address] [phone]       Call NORRIS,STEVEN R, MD. (office in 1 week to set up appointment for evaluation on your left shoulder pain)    Contact information:   [address], [address] [address] [id number]           The results of significant diagnostics from this hospitalization (including imaging, microbiology, ancillary and laboratory) are listed below for reference.    Significant Diagnostic Studies: Ct Head Wo Contrast  01/21/2013  *RADIOLOGY REPORT*  Clinical Data: Hypertension, loss of vision, dizziness  CT HEAD WITHOUT CONTRAST  Technique:  Contiguous axial images were obtained from the base of the skull through the vertex without contrast.  Comparison: None.  Findings:  Scattered very minimal periventricular hypodensities suggestive of microvascular ischemic disease.  Gray white differentiation is otherwise well maintained.  No CT evidence of acute large territory infarct.  No intraparenchymal or extra-axial mass or hemorrhage. There is mild diffuse increased attenuation of the intracranial blood pool suggestive of volume depletion. Normal size and configuration of the ventricles and basilar cisterns.  No  midline shift.  Limited visualization of the paranasal sinuses and mastoid air cells are normal.  Regional soft tissues are normal.  No displaced calvarial fracture.  IMPRESSION: Minimal microvascular ischemic disease without acute intracranial process.   Original Report Authenticated By: Jake Seats, MD    Mr [facility] Wo Contrast  01/21/2013  *RADIOLOGY REPORT*  Clinical Data:  Dizziness.  Stroke.  MRI HEAD WITHOUT CONTRAST MRA HEAD WITHOUT CONTRAST  Technique:  Multiplanar, multiecho pulse sequences of the brain and surrounding structures were obtained without intravenous contrast. Angiographic images of the head were obtained using MRA technique without contrast.  Comparison:  CT head without contrast 01/22/2012.  MRI HEAD  Findings:  The diffusion weighted images demonstrate a 6 mm focal infarct in the inferior left cerebellum.  There is no associated hemorrhage.  T2 signal is associated.  Minimal atrophy white matter disease is otherwise within normal limits for age.  No hemorrhage or mass lesion is present.  Abnormal signal is present within the left vertebral artery suggesting slow flow.  The basilar artery is within normal limits.  The anterior circulation is unremarkable.  The globes and orbits are intact.  The paranasal sinuses and mastoid air cells are clear.  IMPRESSION:  1.  Acute / subacute 6 mm non hemorrhagic infarct in the inferior left cerebellum. 2.  Abnormal signal within the left vertebral artery compatible with slow or occluded flow.  MRA HEAD  Findings: The internal carotid arteries are within normal limits bilaterally.  Prominent posterior communicating arteries are present bilaterally.  The A1 and M1 segments are normal.  The MCA bifurcations are within normal limits.  The anterior communicating artery is patent.  There is some attenuation of MCA branch vessels bilaterally.  The right vertebral artery is the dominant vessel.  There is segmental signal loss within the smaller left  vertebral artery. The right PICA is visualized and normal.  The basilar artery is within normal limits.  The basilar artery terminates at the superior cerebellar arteries with fetal type posterior cerebral arteries bilaterally.  There is some attenuation of distal PCA branch vessels.  IMPRESSION:  1.  Mild to moderate small vessel disease. 2.  Segmental signal loss within the left vertebral artery suggesting a significant focal stenosis or perhaps dissection. 3.  Fetal type posterior cerebral arteries. 4.  Moderate small vessel disease.  Original Report Authenticated By: San Morelle, M.D.    Mr Brain Wo Contrast  01/21/2013  *RADIOLOGY REPORT*  Clinical Data:  Dizziness.  Stroke.  MRI HEAD WITHOUT CONTRAST MRA HEAD WITHOUT CONTRAST  Technique:  Multiplanar, multiecho pulse sequences of the brain and surrounding structures were obtained without intravenous contrast. Angiographic images of the head were obtained using MRA technique without contrast.  Comparison:  CT head without contrast 01/22/2012.  MRI HEAD  Findings:  The diffusion weighted images demonstrate a 6 mm focal infarct in the inferior left cerebellum.  There is no associated hemorrhage.  T2 signal is associated.  Minimal atrophy white matter disease is otherwise within normal limits for age.  No hemorrhage or mass lesion is present.  Abnormal signal is present within the left vertebral artery suggesting slow flow.  The basilar artery is within normal limits.  The anterior circulation is unremarkable.  The globes and orbits are intact.  The paranasal sinuses and mastoid air cells are clear.  IMPRESSION:  1.  Acute / subacute 6 mm non hemorrhagic infarct in the inferior left cerebellum. 2.  Abnormal signal within the left vertebral artery compatible with slow or occluded flow.  MRA HEAD  Findings: The internal carotid arteries are within normal limits bilaterally.  Prominent posterior communicating arteries are present bilaterally.  The A1 and  M1 segments are normal.  The MCA bifurcations are within normal limits.  The anterior communicating artery is patent.  There is some attenuation of MCA branch vessels bilaterally.  The right vertebral artery is the dominant vessel.  There is segmental signal loss within the smaller left vertebral artery. The right PICA is visualized and normal.  The basilar artery is within normal limits.  The basilar artery terminates at the superior cerebellar arteries with fetal type posterior cerebral arteries bilaterally.  There is some attenuation of distal PCA branch vessels.  IMPRESSION:  1.  Mild to moderate small vessel disease. 2.  Segmental signal loss within the left vertebral artery suggesting a significant focal stenosis or perhaps dissection. 3.  Fetal type posterior cerebral arteries. 4.  Moderate small vessel disease.   Original Report Authenticated By: San Morelle, M.D.     Labs: Basic Metabolic Panel:  Lab [id number] 0600 01/21/13 1535 01/19/13 1835  NA 139 137 138  K 4.1 4.1 4.1  CL 103 100 103  CO2 26 26 25   GLUCOSE 135* 115* 176*  BUN 17 18 23   CREATININE 1.82* 1.89* 2.14*  CALCIUM 9.5 9.5 9.3  MG -- -- --  PHOS -- -- --   Liver Function Tests:  Lab 01/21/13 1535 01/19/13 1835  AST 20 21  ALT 22 21  ALKPHOS 101 91  BILITOT 0.6 0.4  PROT 7.6 7.0  ALBUMIN 3.8 4.2   CBC:  Lab 01/23/13 0600 01/21/13 1535  WBC 7.9 7.5  NEUTROABS -- 4.9  HGB 13.7 13.9  HCT 40.1 41.6  MCV 86.1 85.1  PLT 211 225   Cardiac Enzymes:  Lab 01/21/13 1535  CKTOTAL --  CKMB --  CKMBINDEX --  TROPONINI <0.30   CBG:  Lab 01/23/13 0746 01/22/13 2120 01/22/13 1715 01/22/13 1145 01/22/13 0810  GLUCAP 126* 112* 103* 103* 171*      Signed:  Dylann Layne  [facility] 01/23/2013, 11:08 AM

## 2013-01-23 NOTE — Progress Notes (Signed)
Physical Therapy Treatment Patient Details Name: Jared [name] MRN: [id number] DOB: 1946-06-22 Today's Date: 01/23/2013 Time: [id number] PT Time Calculation (min): 24 min  PT Assessment / Plan / Recommendation Comments on Treatment Session  Patient did well with mobility with cane - able to maintain balance on level surfaces.  Unable to handle challenge to balance.  Patient able to recall vestibular exercises for hypofunction on left.  Strategies to decrease dizziness (fixed gaze) reviewed.  Patient to have f/u OP PT for vestibular rehab/balance therapy.    Follow Up Recommendations  Outpatient PT;Supervision - Intermittent     Does the patient have the potential to tolerate intense rehabilitation     Barriers to Discharge        Equipment Recommendations  Cane    Recommendations for Other Services    Frequency Min 4X/week   Plan Discharge plan remains appropriate    Precautions / Restrictions Precautions Precautions: Fall Restrictions Weight Bearing Restrictions: No   Pertinent Vitals/Pain     Mobility  Bed Mobility Bed Mobility: Rolling Right;Right Sidelying to Sit;Sitting - Scoot to Edge of Bed;Sit to Sidelying Right Rolling Right: 5: Supervision;With rail Right Sidelying to Sit: 4: Min assist;HOB flat Sitting - Scoot to Edge of Bed: 4: Min guard Sit to Sidelying Right: 4: Min assist;HOB flat Details for Bed Mobility Assistance: Verbal cues for technique.  Assist to raise trunk off bed to come to sitting, and assist to raise LE's onto bed returning to supine. Transfers Transfers: Sit to Stand;Stand to Sit Sit to Stand: 4: Min assist;With upper extremity assist;From bed Stand to Sit: 4: Min assist;With upper extremity assist;To bed Details for Transfer Assistance: Verbal cues and assist to translate trunk forward over feet.  Assist to rise from sitting.  Patient with slight posterior lean in standing. Ambulation/Gait Ambulation/Gait Assistance: 4: Min assist Ambulation  Distance (Feet): 50 Feet Assistive device: Straight cane Ambulation/Gait Assistance Details: More steady gait with use of cane.  Patient reports he had just ambulated in hallway with nursing.  Agreed to take short walk with PT to assess use of cane. Gait Pattern: Step-through pattern;Decreased stride length;Shuffle;Trunk flexed Gait velocity: decreased    Exercises Other Exercises Other Exercises: x1 exercises in sitting and standing moving head side to side and up/down.  Able to complete 3 repetitions each Other Exercises: Sacades side to side x 3 repetitions for 30 seconds each    PT Goals Acute Rehab PT Goals PT Goal: Supine/Side to Sit - Progress: Progressing toward goal PT Goal: Sit to Stand - Progress: Progressing toward goal PT Goal: Ambulate - Progress: Progressing toward goal PT Goal: Perform Home Exercise Program - Progress: Met  Visit Information  Last PT Received On: 01/23/13 Assistance Needed: +1    Subjective Data  Subjective: "I can feel the dizziness coming on"   Cognition  Overall Cognitive Status: Appears within functional limits for tasks assessed/performed Arousal/Alertness: Awake/alert Orientation Level: Appears intact for tasks assessed Behavior During Session: [facility] for tasks performed    Balance     End of Session PT - End of Session Equipment Utilized During Treatment: Gait belt Activity Tolerance: Patient tolerated treatment well Patient left: in bed;with call bell/phone within reach;with family/visitor present Nurse Communication: Mobility status   GP     Despina Pole 01/23/2013, 3:37 PM Carita Pian. Sanjuana Kava, [place] Pager [phone]

## 2013-01-23 NOTE — Progress Notes (Signed)
Pt with 6bt run vtach on monitor.  Pt. asymptomic denies cp, sob, palpitations.  VSS.  Night hospitalist notified.  Will continue to monitor.

## 2013-01-23 NOTE — Progress Notes (Addendum)
Stroke Team Progress Note  HISTORY   Patient was not a TPA candidate secondary to not in TPA window due to timing.   SUBJECTIVE Jared [name] is an 67 y.o. male with a past medical history significant for hypertension, hypercholesterolemia, and osteoarthritis, who in the past couple of days has been experiencing intermittent episodes characterized by lightheadedness, room spinning, double vision, and imbalance that typically last for several minutes and completely resolve.  There is no associated headache, difficulty swallowing, focal weakness or numbness, slurred speech, or confusion.  He expressed that the first episode occurred while he was driving and he had to pull over because he couldn't see well.  Never had similar episodes before.  He presented to the ED for further evaluation and was noted to have significantly elevated blood pressure.  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 MRA brain did not shoed significant intracranial arterial disease. Pt's symptoms have significantly improved since admission.    OBJECTIVE Most recent Vital Signs: Filed Vitals:   01/22/13 1200 01/22/13 2100 01/23/13 0007 01/23/13 0356  BP: 154/90 168/100 159/81 164/100  Pulse: 89 93 81 90  Temp: 98.9 F (37.2 C) 99.2 F (37.3 C) 99.3 F (37.4 C) 98.7 F (37.1 C)  TempSrc: Oral     Resp: 18 18 20 18   Height:      Weight:      SpO2: 98% 94% 96% 96%   CBG (last 3)   Basename 01/23/13 0746 01/22/13 2120 01/22/13 1715  GLUCAP 126* 112* 103*    IV Fluid Intake:      . sodium chloride 75 mL/hr at 01/22/13 0010    MEDICATIONS     . amLODipine  10 mg Oral Daily  . aspirin  81 mg Oral Daily  . febuxostat  80 mg Oral Daily  . insulin aspart  0-5 Units Subcutaneous QHS  . insulin aspart  0-9 Units Subcutaneous TID WC  . irbesartan  150 mg Oral Daily  . labetalol  200 mg Oral TID  . simvastatin  20 mg Oral  q1800   PRN:  acetaminophen, acetaminophen, diazepam, hydrALAZINE, ondansetron (ZOFRAN) IV, senna-docusate  Diet:  Carb Control  Activity:   Up with assistance DVT Prophylaxis: SCDs  CLINICALLY SIGNIFICANT STUDIES Basic Metabolic Panel:   Lab 123XX123 0600 01/21/13 1535  NA 139 137  K 4.1 4.1  CL 103 100  CO2 26 26  GLUCOSE 135* 115*  BUN 17 18  CREATININE 1.82* 1.89*  CALCIUM 9.5 9.5  MG -- --  PHOS -- --   Liver Function Tests:   Lab 01/21/13 1535 01/19/13 1835  AST 20 21  ALT 22 21  ALKPHOS 101 91  BILITOT 0.6 0.4  PROT 7.6 7.0  ALBUMIN 3.8 4.2   CBC:   Lab 01/23/13 0600 01/21/13 1535  WBC 7.9 7.5  NEUTROABS -- 4.9  HGB 13.7 13.9  HCT 40.1 41.6  MCV 86.1 85.1  PLT 211 225   Coagulation: No results found for this basename: LABPROT:4,INR:4 in the last 168 hours Cardiac Enzymes:   Lab 01/21/13 1535  CKTOTAL --  CKMB --  CKMBINDEX --  TROPONINI <0.30   Urinalysis:   Lab 01/19/13 1836  COLORURINE --  LABSPEC --  PHURINE --  GLUCOSEU --  HGBUR --  BILIRUBINUR neg  KETONESUR --  PROTEINUR --  UROBILINOGEN 0.2  NITRITE neg  LEUKOCYTESUR Negative   Lipid Panel  Component Value Date/Time   CHOL 157 01/22/2013 0530   TRIG 181* 01/22/2013 0530   HDL 28* 01/22/2013 0530   CHOLHDL 5.6 01/22/2013 0530   VLDL 36 01/22/2013 0530   LDLCALC 93 01/22/2013 0530   HgbA1C  Lab Results  Component Value Date   HGBA1C 6.8* 01/22/2013    Urine Drug Screen:   No results found for this basename: labopia,  cocainscrnur,  labbenz,  amphetmu,  thcu,  labbarb    Alcohol Level: No results found for this basename: ETH:2 in the last 168 hours  Ct Head Wo Contrast  01/21/2013  *RADIOLOGY REPORT*  Clinical Data: Hypertension, loss of vision, dizziness  CT HEAD WITHOUT CONTRAST  Technique:  Contiguous axial images were obtained from the base of the skull through the vertex without contrast.  Comparison: None.  Findings:  Scattered very minimal periventricular hypodensities  suggestive of microvascular ischemic disease.  Gray white differentiation is otherwise well maintained.  No CT evidence of acute large territory infarct.  No intraparenchymal or extra-axial mass or hemorrhage. There is mild diffuse increased attenuation of the intracranial blood pool suggestive of volume depletion. Normal size and configuration of the ventricles and basilar cisterns.  No midline shift.  Limited visualization of the paranasal sinuses and mastoid air cells are normal.  Regional soft tissues are normal.  No displaced calvarial fracture.  IMPRESSION: Minimal microvascular ischemic disease without acute intracranial process.   Original Report Authenticated By: Jake Seats, MD    Mr [facility] Wo Contrast  01/21/2013  *RADIOLOGY REPORT*  Clinical Data:  Dizziness.  Stroke.  MRI HEAD WITHOUT CONTRAST MRA HEAD WITHOUT CONTRAST  Technique:  Multiplanar, multiecho pulse sequences of the brain and surrounding structures were obtained without intravenous contrast. Angiographic images of the head were obtained using MRA technique without contrast.  Comparison:  CT head without contrast 01/22/2012.  MRI HEAD  Findings:  The diffusion weighted images demonstrate a 6 mm focal infarct in the inferior left cerebellum.  There is no associated hemorrhage.  T2 signal is associated.  Minimal atrophy white matter disease is otherwise within normal limits for age.  No hemorrhage or mass lesion is present.  Abnormal signal is present within the left vertebral artery suggesting slow flow.  The basilar artery is within normal limits.  The anterior circulation is unremarkable.  The globes and orbits are intact.  The paranasal sinuses and mastoid air cells are clear.  IMPRESSION:  1.  Acute / subacute 6 mm non hemorrhagic infarct in the inferior left cerebellum. 2.  Abnormal signal within the left vertebral artery compatible with slow or occluded flow.  MRA HEAD  Findings: The internal carotid arteries are within normal  limits bilaterally.  Prominent posterior communicating arteries are present bilaterally.  The A1 and M1 segments are normal.  The MCA bifurcations are within normal limits.  The anterior communicating artery is patent.  There is some attenuation of MCA branch vessels bilaterally.  The right vertebral artery is the dominant vessel.  There is segmental signal loss within the smaller left vertebral artery. The right PICA is visualized and normal.  The basilar artery is within normal limits.  The basilar artery terminates at the superior cerebellar arteries with fetal type posterior cerebral arteries bilaterally.  There is some attenuation of distal PCA branch vessels.  IMPRESSION:  1.  Mild to moderate small vessel disease. 2.  Segmental signal loss within the left vertebral artery suggesting a significant focal stenosis or perhaps dissection. 3.  Fetal type posterior cerebral arteries. 4.  Moderate small vessel disease.   Original Report Authenticated By: San Morelle, M.D.    Mr Brain Wo Contrast  01/21/2013  *RADIOLOGY REPORT*  Clinical Data:  Dizziness.  Stroke.  MRI HEAD WITHOUT CONTRAST MRA HEAD WITHOUT CONTRAST  Technique:  Multiplanar, multiecho pulse sequences of the brain and surrounding structures were obtained without intravenous contrast. Angiographic images of the head were obtained using MRA technique without contrast.  Comparison:  CT head without contrast 01/22/2012.  MRI HEAD  Findings:  The diffusion weighted images demonstrate a 6 mm focal infarct in the inferior left cerebellum.  There is no associated hemorrhage.  T2 signal is associated.  Minimal atrophy white matter disease is otherwise within normal limits for age.  No hemorrhage or mass lesion is present.  Abnormal signal is present within the left vertebral artery suggesting slow flow.  The basilar artery is within normal limits.  The anterior circulation is unremarkable.  The globes and orbits are intact.  The paranasal sinuses and  mastoid air cells are clear.  IMPRESSION:  1.  Acute / subacute 6 mm non hemorrhagic infarct in the inferior left cerebellum. 2.  Abnormal signal within the left vertebral artery compatible with slow or occluded flow.  MRA HEAD  Findings: The internal carotid arteries are within normal limits bilaterally.  Prominent posterior communicating arteries are present bilaterally.  The A1 and M1 segments are normal.  The MCA bifurcations are within normal limits.  The anterior communicating artery is patent.  There is some attenuation of MCA branch vessels bilaterally.  The right vertebral artery is the dominant vessel.  There is segmental signal loss within the smaller left vertebral artery. The right PICA is visualized and normal.  The basilar artery is within normal limits.  The basilar artery terminates at the superior cerebellar arteries with fetal type posterior cerebral arteries bilaterally.  There is some attenuation of distal PCA branch vessels.  IMPRESSION:  1.  Mild to moderate small vessel disease. 2.  Segmental signal loss within the left vertebral artery suggesting a significant focal stenosis or perhaps dissection. 3.  Fetal type posterior cerebral arteries. 4.  Moderate small vessel disease.   Original Report Authenticated By: San Morelle, M.D.     CT of the brain  Minimal microvascular ischemic disease without acute intracranial  process.  MRI of the brain Acute / subacute 6 mm non hemorrhagic infarct in the inferior  left cerebellum.  MRA of the brain  Mild to moderate small vessel disease. Fetal PCA no hemodynamic stenosis.    2D Echocardiogram  The cavity size was normal. Wall thickness was normal. Systolic function was normal. The estimated ejection fraction was in the range of 60% to 65%. Wall motion was normal; there were no regional wall motion abnormalities.   Carotid Doppler    CXR  THE HEART SIZE AND MEDIASTINAL CONTOURS ARE WITHIN NORMAL LIMITS. THE LUNGS ARE  CLEAR   Physical Exam   Mental Status:  Alert, oriented, thought content appropriate. Speech fluent without evidence of aphasia. Able to follow 3 step commands without difficulty.  Cranial Nerves:  II: Discs flat bilaterally; Visual fields grossly normal, pupils equal, round, reactive to light and accommodation  III,IV, VI: ptosis not present, extra-ocular motions intact bilaterally  V,VII: smile symmetric, facial light touch sensation normal bilaterally  VIII: hearing normal bilaterally  IX,X: gag reflex present  XI: bilateral shoulder shrug  XII: midline tongue extension  Motor:  Right : Upper  extremity 5/5 Left: Upper extremity 4/5 - proximal weakness mostly due to pain.   Lower extremity 5/5 Lower extremity 5/5  Tone and bulk:normal tone throughout; no atrophy noted  Sensory: Pinprick and light touch intact throughout, bilaterally  Deep Tendon Reflexes: 2+ and symmetric throughout  Plantars:  Right: downgoing Left: downgoing  Cerebellar:  Much improved dysmetria on L side and minimal difficulty with heal to shin.   Gait: needs assistance states feels as he is being pulled to L side.  CV: pulses palpable throughout    ASSESSMENT Jared [name] is an 67 y.o. male with a past medical history significant for hypertension, hypercholesterolemia, and osteoarthritis, who in the past couple of days has been experiencing intermittent episodes characterized by lightheadedness, room spinning, double vision, and imbalance that typically last for several minutes and completely resolve.  There is no associated headache, difficulty swallowing, focal weakness or numbness, slurred speech, or confusion.  He expressed that the first episode occurred while he was driving and he had to pull over because he couldn't see well.  Never had similar episodes before.  He presented to the ED for further evaluation and was noted to have significantly elevated blood pressure.   MRI that revealed an acute  infarction 6 mm in the left inferior cerebellum. MRA brain did not shoed significant intracranial arterial disease. Pt's symptoms have significantly improved since admission.  Able to ambulate with minimal assistance    Hospital day # 2  TREATMENT/PLAN  Pt was not on antiplatelet at home.  Started on ASA 81 in hospital, please increase to ASA 325  On SCDs for DVT prophylaxis. If he is going to be in bed today should be started on heparin or lovenox.   Blood pressure control. Can hyper perfuse to 220/110 for 24 hrs post stroke, but now can start treating it with goal SBP of <180  Pt/Ot  Pt has a diet.    Con't zoccor 20   HbA1C, sliding scale  Pt evaluating pt and can be d/c home with vestibular therapy.    He works in a [employer], which is physical. Spoke to daughter who will bring paperwork for disability to fill out.    Carotid dopplers, prelim no hemodynamic stenosis.   Can be d/c from Neuro stand point. Will s/off call with any questions.   Leotis Pain   01/23/2013 9:42 AM  I have personally obtained a history, examined the patient, evaluated imaging results, and formulated the assessment and plan of care. I agree with the above.

## 2013-01-23 NOTE — Progress Notes (Signed)
NCM spoke to pt and provided information for Outpt PT/OT.  Contact info placed on dc instructions. Faxed referral to [facility]. Pt gave permission to speak to dtr, [facility] [place] # [phone] and # [phone]. Dtr states she will assist pt with getting all the paperwork from his job and complete for [employer]. Pt is requesting a letter for is job for missing days from work.  Jonnie Finner RN CCM Case Mgmt phone [phone]

## 2013-01-23 NOTE — ED Provider Notes (Signed)
Medical screening examination/treatment/procedure(s) were conducted as a shared visit with non-physician practitioner(s) and myself.  I personally evaluated the patient during the encounter  Threasa Beards, MD 01/23/13 [phone]

## 2013-01-24 ENCOUNTER — Telehealth: Payer: Self-pay | Admitting: Internal Medicine

## 2013-01-24 NOTE — Telephone Encounter (Signed)
Patient was [place] on 2/2 from hosp dx: TIA. Is supposed to have post hosp with Arnoldo Morale this week. I offered Padonda or someone else, pt refused. Wants to see Dr. Arnoldo Morale. Please advise.

## 2013-01-24 NOTE — Telephone Encounter (Signed)
Received call from pt's daughter Jared [name]).  Per discharge instructions patient needs to be seen within 2 weeks.  Requesting to see Dr. Arnoldo Morale, not another provider.  Please advise.  FYI:  Pateint has had PT, OT and vestibular therapy ordered, daughter will call to have those appointments scheduled.

## 2013-01-24 NOTE — Telephone Encounter (Signed)
Called pt and LMOM informing him of what needs to be done. Asked him to call and schedule. Encounter closed.

## 2013-01-24 NOTE — Telephone Encounter (Signed)
He needs to agree to see pandonda I will follow with her

## 2013-01-24 NOTE — Telephone Encounter (Signed)
Has to see padonda. Tell him I will follow with her Needs to be seen this week

## 2013-01-25 ENCOUNTER — Ambulatory Visit (INDEPENDENT_AMBULATORY_CARE_PROVIDER_SITE_OTHER): Payer: BC Managed Care – PPO | Admitting: Family

## 2013-01-25 ENCOUNTER — Encounter: Payer: Self-pay | Admitting: Family

## 2013-01-25 VITALS — BP 148/72 | HR 72 | Temp 98.7°F | Ht 69.0 in | Wt 235.0 lb

## 2013-01-25 DIAGNOSIS — I6350 Cerebral infarction due to unspecified occlusion or stenosis of unspecified cerebral artery: Secondary | ICD-10-CM

## 2013-01-25 DIAGNOSIS — I10 Essential (primary) hypertension: Secondary | ICD-10-CM

## 2013-01-25 DIAGNOSIS — R42 Dizziness and giddiness: Secondary | ICD-10-CM

## 2013-01-25 DIAGNOSIS — E7800 Pure hypercholesterolemia, unspecified: Secondary | ICD-10-CM

## 2013-01-25 DIAGNOSIS — E119 Type 2 diabetes mellitus without complications: Secondary | ICD-10-CM

## 2013-01-25 DIAGNOSIS — I639 Cerebral infarction, unspecified: Secondary | ICD-10-CM

## 2013-01-25 NOTE — Patient Instructions (Addendum)
Stroke A stroke (cerebrovascular accident) is the sudden death of brain tissue. It is a medical emergency. A stroke can cause permanent loss of brain function. This can cause problems with different parts of your body. A transient ischemic attack (TIA) is different because it does not cause permanent damage. A TIA is a short-lived problem of poor blood flow affecting a part of the brain. A TIA is also a serious problem because having a TIA greatly increases the chances of having a stroke. When symptoms first develop, you cannot know if the problem might be a stroke or TIA. CAUSES  A stroke is caused by a decrease of oxygen supply to an area of your brain. It is usually the result of a small blood clot or collection of cholesterol or fat (plaque) that blocks blood flow in the brain. A stroke can also be caused by blocked or damaged carotid arteries. Bleeding in the brain can cause, or accompany, a stroke. RISK FACTORS  High blood pressure (hypertension).  High cholesterol.  Diabetes.  Heart disease.  The buildup of fatty deposits in the blood vessels (peripheral artery disease or atherosclerosis).  An abnormal heart rhythm (atrial fibrillation).  Obesity.  Smoking.  Taking oral contraceptives (especially in combination with smoking).  Physical inactivity.  A diet high in fats, salt (sodium), and calories.  Alcohol use.  Use of illegal drugs (especially cocaine and methamphetamine).  Being male.  Being African American.  Being over the age of 70.  Family history of stroke.  Previous history of blood clots, a "warning stroke" (transient ischemic attack, TIA), or heart attack.  Sickle cell disease. SYMPTOMS  These symptoms usually develop suddenly, or may be newly present upon awakening from sleep:  Sudden weakness or numbness of the face, arm, or leg, especially on one side of the body.  Sudden confusion.  Trouble speaking (aphasia) or understanding.  Sudden trouble  seeing in one or both eyes.  Sudden trouble walking.  Dizziness.  Loss of balance or coordination.  Sudden severe headache with no known cause.  Trouble reading or writing. DIAGNOSIS  Your caregiver can often determine the presence or absence of a stroke based on your symptoms, history, and physical exam. A computerized X-ray scan (CT or CAT scan) of the brain is usually performed to confirm the stroke, to look for causes, and to determine the severity. Other tests may be done to find the cause of the stroke. These tests may include:  An EKG and heart monitoring.  An ultrasound evaluation of the heart (echocardiogram).  An ultrasound evaluation of your carotid arteries.  A computerized magnetic scan (MRI).  A scan of the brain circulation.  Blood oxygen level monitoring.  Blood tests. PREVENTION  The risk of a stroke can be decreased by appropriately treating high blood pressure, high cholesterol, diabetes, heart disease, and obesity and by quitting smoking, limiting alcohol, and staying physically active. TREATMENT  Time is of the essence. It is important to seek treatment within 3 to 4 hours of the start of symptoms because you may receive a "clot dissolving" medicine that cannot be given after that time. Even if you do not know when your symptoms began, get treatment as soon as possible. After the 4 hour window has passed, treatment may include rest, oxygen, intravenous (IV) fluids, and medicines to thin the blood (to prevent another stroke). Treatment of stroke depends on the duration, severity, and cause of your symptoms. Medicines and diet may be used to address diabetes, high  blood pressure, and other risk factors. Physical, speech, and occupational therapists will assess you and work to improve any functions impaired by the stroke. Measures will be taken to prevent short-term and long-term complications, including infection from breathing foreign material into the lungs  (aspiration pneumonia), blood clots in the legs, bedsores, and falls. Rarely, surgery may be needed to remove large blood clots or to open up blocked arteries. HOME CARE INSTRUCTIONS   Medicines: Aspirin and blood thinners may be used to prevent another stroke. Blood thinners need to be used exactly as instructed. Medicines may also be used to control risk factors for a stroke. Be sure you understand all your medicine instructions.  Diet: Certain diets may be prescribed to address high blood pressure, high cholesterol, diabetes, or obesity.  A low-sodium, low-saturated fat, low-trans fat, low-cholesterol diet is recommended to manage high blood pressure.  A low-saturated fat, low-trans fat, low-cholesterol, and high-fiber diet may control cholesterol levels.  A controlled-carbohydrate, controlled-sugar diet is recommended to manage diabetes.  A reduced-calorie, low-sodium, low-saturated fat, low-trans fat, low-cholesterol diet is recommended to manage obesity.  A diet that includes 5 or more servings of fruits and vegetables a day may reduce the risk of stroke. Foods may need to be a certain consistency (soft or pureed), or small bites may need to be taken in order to avoid aspirating or choking.  Maintain a healthy weight.  Stay physically active. It is recommended that you get at least 30 minutes of activity on most or all days.  Do not smoke.  Limit alcohol use. Moderate alcohol use is considered to be:  No more than 2 drinks per day for men.  No more than 1 drink per day for nonpregnant women.  Stop drug abuse.  Home safety: A safe home environment is important to reduce the risk of falls. Your caregiver may arrange for specialists to evaluate your home. Having grab bars in the bedroom and bathroom is often important. Your caregiver may arrange for equipment to be used at home, such as raised toilets and a seat for the shower.  Physical, occupational, and speech therapy: Ongoing  therapy may be needed to maximize your recovery after a stroke. If you have been advised to use a walker or a cane, use it at all times. Be sure to keep your therapy appointments.  Follow all instructions for follow-up with your caregiver. This is very important. This includes any referrals, physical therapy, rehabilitation, and lab tests. Proper treatment also prevents another stroke from occurring. SEEK IMMEDIATE MEDICAL CARE IF:   You have sudden weakness or numbness of the face, arm, or leg, especially on one side of the body.  You have sudden confusion.  You have trouble speaking (aphasia) or understanding.  You have sudden trouble seeing in one or both eyes.  You have sudden trouble walking.  You have dizziness.  You have a loss of balance or coordination.  You have a sudden, severe headache with no known cause.  You have new chest pain or an irregular heartbeat. Any of these symptoms may represent a serious problem that is an emergency. Do not wait to see if the symptoms will go away. Get medical help right away. Call your local emergency services (911 in [place] Do not drive yourself to the hospital. Document Released: 12/08/2005 Document Revised: 03/01/2012 Document Reviewed: 07/28/2011 [facility] Patient Information 2013 [place].    Hypercholesterolemia High Blood Cholesterol Cholesterol is a white, waxy, fat-like protein needed by your body in  small amounts. The liver makes all the cholesterol you need. It is carried from the liver by the blood through the blood vessels. Deposits (plaque) may build up on blood vessel walls. This makes the arteries narrower and stiffer. Plaque increases the risk for heart attack and stroke. You cannot feel your cholesterol level even if it is very high. The only way to know is by a blood test to check your lipid (fats) levels. Once you know your cholesterol levels, you should keep a record of the test results. Work with your caregiver to  to keep your levels in the desired range. WHAT THE RESULTS MEAN:  Total cholesterol is a rough measure of all the cholesterol in your blood.  LDL is the so-called bad cholesterol. This is the type that deposits cholesterol in the walls of the arteries. You want this level to be low.  HDL is the good cholesterol because it cleans the arteries and carries the LDL away. You want this level to be high.  Triglycerides are fat that the body can either burn for energy or store. High levels are closely linked to heart disease. DESIRED LEVELS:  Total cholesterol below 200.  LDL below 100 for people at risk, below 70 for very high risk.  HDL above 50 is good, above 60 is best.  Triglycerides below 150. HOW TO LOWER YOUR CHOLESTEROL:  Diet.  Choose fish or white meat chicken and [place], roasted or baked. Limit fatty cuts of red meat, fried foods, and processed meats, such as sausage and lunch meat.  Eat lots of fresh fruits and vegetables. Choose whole grains, beans, pasta, potatoes and cereals.  Use only small amounts of olive, corn or canola oils. Avoid butter, mayonnaise, shortening or palm kernel oils. Avoid foods with trans-fats.  Use skim/nonfat milk and low-fat/nonfat yogurt and cheeses. Avoid whole milk, cream, ice cream, egg yolks and cheeses. Healthy desserts include angel food cake, gingersnaps, animal crackers, hard candy, popsicles, and low-fat/nonfat frozen yogurt. Avoid pastries, cakes, pies and cookies.  Exercise.  A regular program helps decrease LDL and raises HDL.  Helps with weight control.  Do things that increase your activity level like gardening, walking, or taking the stairs.  Medication.  May be prescribed by your caregiver to help lowering cholesterol and the risk for heart disease.  You may need medicine even if your levels are normal if you have several risk factors. HOME CARE INSTRUCTIONS   Follow your diet and exercise programs as suggested by your  caregiver.  Take medications as directed.  Have blood work done when your caregiver feels it is necessary. MAKE SURE YOU:   Understand these instructions.  Will watch your condition.  Will get help right away if you are not doing well or get worse. Document Released: 12/08/2005 Document Revised: 03/01/2012 Document Reviewed: 05/26/2007 [facility] Patient Information 2013 [place].

## 2013-01-26 ENCOUNTER — Ambulatory Visit: Payer: BC Managed Care – PPO | Admitting: Family

## 2013-01-26 ENCOUNTER — Ambulatory Visit
Payer: BC Managed Care – PPO | Attending: Internal Medicine | Admitting: Rehabilitative and Restorative Service Providers"

## 2013-01-26 DIAGNOSIS — I69998 Other sequelae following unspecified cerebrovascular disease: Secondary | ICD-10-CM | POA: Insufficient documentation

## 2013-01-26 DIAGNOSIS — Z5189 Encounter for other specified aftercare: Secondary | ICD-10-CM | POA: Insufficient documentation

## 2013-01-26 DIAGNOSIS — R42 Dizziness and giddiness: Secondary | ICD-10-CM | POA: Insufficient documentation

## 2013-01-26 DIAGNOSIS — R269 Unspecified abnormalities of gait and mobility: Secondary | ICD-10-CM | POA: Insufficient documentation

## 2013-01-26 NOTE — Progress Notes (Signed)
Subjective:    Patient ID: Jared [name], male    DOB: [birth year], 67 y.o.   MRN: [id number]  HPI 67 year old African American male, nonsmoker, patient of Dr. Arnoldo Morale is in today status post CVA on 01/21/2013. Patient reports presenting to the urgent care clinic on the 29th with blurred vision and nausea. Patient reports attempting to drive home and had to pull over to the side of the road. He eventually gathered himself and drove himself the rest of the way home. He called his daughters to take him back to the urgent care clinic  01/21/2013. He was sent to the emergency department and diagnosed with CVA. He continues to have dizzy episodes about 3-6 times a day that are worse in when he attempts to speak or when he turns his head particularly to the left. Patient reports an increase in the amount of stress in his life. Prior to this incident it appears he was considering divorce. Also reports his wife and daughter not getting along well. He continues to have left-sided arm weakness and feels like he drifts to the left when walking.   Review of Systems  Eyes: Positive for visual disturbance.  Respiratory: Negative.   Cardiovascular: Negative.   Gastrointestinal: Negative.   Musculoskeletal: Negative.        Left arm weakness  Skin: Negative.   Neurological: Positive for dizziness, weakness, light-headedness and headaches.  Hematological: Negative.   Psychiatric/Behavioral: Negative.    Past Medical History  Diagnosis Date  . 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  . GOUT 07/26/2007  . HYPERCHOLESTEROLEMIA, BORDERLINE 01/16/2010  . HYPERTENSION 07/26/2007  . OSTEOARTHRITIS 09/28/2007  . PLANTAR FASCIITIS, BILATERAL 04/19/2010  . PROSTATITIS, RECURRENT 11/08/2009  . RENAL INSUFFICIENCY 07/26/2007  . 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 URINARY FREQUENCY, CHRONIC 03/10/2008    History   Social History  . Marital Status: Married    Spouse Name: N/A    Number of  Children: N/A  . Years of Education: N/A   Occupational History  . Not on file.   Social History Main Topics  . Smoking status: Never Smoker   . Smokeless tobacco: Not on file  . Alcohol Use: No  . Drug Use: No  . Sexually Active: Yes   Other Topics Concern  . Not on file   Social History Narrative  . No narrative on file    Past Surgical History  Procedure Date  . Rt arm surgery for gout     Family History  Problem Relation Age of Onset  . Heart disease Mother   . Heart disease Father   . CVA Father   . Diabetes Sister     Allergies  Allergen Reactions  . Other     Strawberries Cant breath    Current Outpatient Prescriptions on File Prior to Visit  Medication Sig Dispense Refill  . amLODipine (NORVASC) 10 MG tablet Take 1 tablet (10 mg total) by mouth daily.  30 tablet  1  . aspirin EC 325 MG tablet Take 1 tablet (325 mg total) by mouth daily.    0  . diazepam (VALIUM) 5 MG tablet Take 5 mg by mouth every 6 (six) hours as needed. For anxiety      . furosemide (LASIX) 40 MG tablet Take 40 mg by mouth daily.      . indomethacin (INDOCIN) 50 MG capsule Take 1 capsule (50 mg total) by mouth 3 (three) times daily with meals as needed (gouty pain).      Marland Kitchen  irbesartan (AVAPRO) 300 MG tablet Take 0.5 tablets (150 mg total) by mouth daily.  30 tablet  1  . labetalol (NORMODYNE) 200 MG tablet Take 200 mg by mouth 2 (two) times daily.      . Multiple Vitamin (MULTIVITAMIN WITH MINERALS) TABS Take 1 tablet by mouth daily.      . simvastatin (ZOCOR) 20 MG tablet Take 1 tablet (20 mg total) by mouth daily at 6 PM.  30 tablet  1  . Febuxostat (ULORIC) 80 MG TABS Take 80 mg by mouth daily.       . metFORMIN (GLUCOPHAGE) 500 MG tablet Take 500 mg by mouth 2 (two) times daily with a meal.       Current Facility-Administered Medications on File Prior to Visit  Medication Dose Route Frequency Provider Last Rate Last Dose  . methylPREDNISolone acetate (DEPO-MEDROL) injection 40 mg   40 mg Intramuscular Once Burnice Logan, MD        BP 148/72  Pulse 72  Temp 98.7 F (37.1 C) (Oral)  Ht 5\' 9"  (1.753 m)  Wt 235 lb (106.595 kg)  BMI 34.70 kg/m2  SpO2 98%chart    Objective:   Physical Exam  Constitutional: He is oriented to person, place, and time. He appears well-developed and well-nourished.  HENT:  Right Ear: External ear normal.  Left Ear: External ear normal.  Nose: Nose normal.  Mouth/Throat: Oropharynx is clear and moist.  Neck: Normal range of motion. Neck supple.  Cardiovascular: Normal rate and normal heart sounds.   Pulmonary/Chest: Effort normal and breath sounds normal.  Abdominal: Soft. Bowel sounds are normal.  Musculoskeletal: Normal range of motion.       Left sided weakness. Decreased grip strength to the left hand. Unable to lift left arm.   Neurological: He is alert and oriented to person, place, and time.       Normal gait except gravitating to the left  Skin: Skin is dry.  Psychiatric: He has a normal mood and affect.          Assessment & Plan:  Assessment:  1. Cerebrovascular accident 2. Left-sided weakness 3. Lightheadedness and dizziness 4. Nausea 5. Hyperlipidemia 6. Hypertension 7. Visual disturbance   Plan: Continue physical therapy. Vestibular rehabilitation as previously scheduled. Refer to neurology. Optimize control of his cholesterol, blood pressure, and other risk factors. Encouraged healthy diet, low-cholesterol. We'll follow with patient in 4 weeks and sooner as needed. Out of work x12 weeks. Patient is able to control nausea without medication.

## 2013-01-27 ENCOUNTER — Ambulatory Visit: Payer: BC Managed Care – PPO | Admitting: Physical Therapy

## 2013-01-28 ENCOUNTER — Encounter: Payer: BC Managed Care – PPO | Admitting: Occupational Therapy

## 2013-01-28 ENCOUNTER — Ambulatory Visit: Payer: BC Managed Care – PPO | Admitting: Occupational Therapy

## 2013-01-31 ENCOUNTER — Ambulatory Visit: Payer: BC Managed Care – PPO | Admitting: Rehabilitative and Restorative Service Providers"

## 2013-01-31 ENCOUNTER — Ambulatory Visit: Payer: BC Managed Care – PPO | Admitting: Internal Medicine

## 2013-02-01 ENCOUNTER — Encounter: Payer: Self-pay | Admitting: Family

## 2013-02-01 ENCOUNTER — Ambulatory Visit (INDEPENDENT_AMBULATORY_CARE_PROVIDER_SITE_OTHER): Payer: BC Managed Care – PPO | Admitting: Family

## 2013-02-01 VITALS — BP 140/90 | HR 87 | Wt 234.0 lb

## 2013-02-01 DIAGNOSIS — I10 Essential (primary) hypertension: Secondary | ICD-10-CM

## 2013-02-01 DIAGNOSIS — F411 Generalized anxiety disorder: Secondary | ICD-10-CM

## 2013-02-01 MED ORDER — PAROXETINE HCL 20 MG PO TABS: 20.0000 mg | ORAL_TABLET | ORAL | Status: DC

## 2013-02-01 MED ORDER — IRBESARTAN 300 MG PO TABS: 300.0000 mg | ORAL_TABLET | Freq: Every day | ORAL | Status: DC

## 2013-02-01 NOTE — Patient Instructions (Signed)
1. Start Paxil 20 mg once daily in the morning 2. Increase Avapro to 300 mg, 1 tablet daily.  3. Office visit in 2-3 weeks. 4. Take Valium prior to physical therapy appointment.

## 2013-02-01 NOTE — Progress Notes (Signed)
Subjective:    Patient ID: Jared [name], male    DOB: 12-04-1946, 67 y.o.   MRN: [id number]  HPI 67 year old African American male, nonsmoker, patient of Dr. Arnoldo Morale is in with complaints of elevated blood pressure at home over the last several days. He is status post CVA and is out on medical leave due to his deficits. He currently takes Norvasc 10 mg once a day Avapro 150 mg once a day, and labetalol 200 mg twice a day. Tolerating her medications well. Reports getting blood pressure readings in the [id number] systolically over [id number]. Therefore, he became concerned. Patient also reports inability to have physical therapy 2 days last week related to his elevated blood pressure. Prior to his stroke, he was having marital issues. Today he continues to complain of marital issues related to finances and his wife wanting a bigger house and his daughter not getting along with his wife. He believes that this exacerbates his blood pressure. Overall, he feels that he still the situation well but is willing to take medication to help him cope and improve.  Patient was left with some visual deficits that are slowly improving. He has less dizziness.   Review of Systems  Constitutional: Negative.   HENT: Negative.   Respiratory: Negative.   Cardiovascular: Negative.   Gastrointestinal: Negative.   Genitourinary: Negative.   Musculoskeletal: Negative.   Neurological: Negative.   Hematological: Negative.   Psychiatric/Behavioral: Negative.    Past Medical History  Diagnosis Date  . 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  . GOUT 07/26/2007  . HYPERCHOLESTEROLEMIA, BORDERLINE 01/16/2010  . HYPERTENSION 07/26/2007  . OSTEOARTHRITIS 09/28/2007  . PLANTAR FASCIITIS, BILATERAL 04/19/2010  . PROSTATITIS, RECURRENT 11/08/2009  . RENAL INSUFFICIENCY 07/26/2007  . 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 URINARY FREQUENCY, CHRONIC 03/10/2008    History   Social History  . Marital Status:  Married    Spouse Name: N/A    Number of Children: N/A  . Years of Education: N/A   Occupational History  . Not on file.   Social History Main Topics  . Smoking status: Never Smoker   . Smokeless tobacco: Not on file  . Alcohol Use: No  . Drug Use: No  . Sexually Active: Yes   Other Topics Concern  . Not on file   Social History Narrative  . No narrative on file    Past Surgical History  Procedure Laterality Date  . Rt arm surgery for gout      Family History  Problem Relation Age of Onset  . Heart disease Mother   . Heart disease Father   . CVA Father   . Diabetes Sister     Allergies  Allergen Reactions  . Other     Strawberries Cant breath    Current Outpatient Prescriptions on File Prior to Visit  Medication Sig Dispense Refill  . amLODipine (NORVASC) 10 MG tablet Take 1 tablet (10 mg total) by mouth daily.  30 tablet  1  . aspirin EC 325 MG tablet Take 1 tablet (325 mg total) by mouth daily.    0  . diazepam (VALIUM) 5 MG tablet Take 5 mg by mouth every 6 (six) hours as needed. For anxiety      . Febuxostat (ULORIC) 80 MG TABS Take 80 mg by mouth daily.       . furosemide (LASIX) 40 MG tablet Take 40 mg by mouth daily.      . indomethacin (INDOCIN) 50 MG  capsule Take 1 capsule (50 mg total) by mouth 3 (three) times daily with meals as needed (gouty pain).      Marland Kitchen labetalol (NORMODYNE) 200 MG tablet Take 200 mg by mouth 2 (two) times daily.      . metFORMIN (GLUCOPHAGE) 500 MG tablet Take 500 mg by mouth 2 (two) times daily with a meal.      . Multiple Vitamin (MULTIVITAMIN WITH MINERALS) TABS Take 1 tablet by mouth daily.      . simvastatin (ZOCOR) 20 MG tablet Take 1 tablet (20 mg total) by mouth daily at 6 PM.  30 tablet  1   Current Facility-Administered Medications on File Prior to Visit  Medication Dose Route Frequency Provider Last Rate Last Dose  . methylPREDNISolone acetate (DEPO-MEDROL) injection 40 mg  40 mg Intramuscular Once Burnice Logan, MD         BP 140/90  Pulse 87  Wt 234 lb (106.142 kg)  BMI 34.54 kg/m2  SpO2 98%chart    Objective:   Physical Exam  Constitutional: He is oriented to person, place, and time. He appears well-developed and well-nourished.  HENT:  Right Ear: External ear normal.  Left Ear: External ear normal.  Nose: Nose normal.  Mouth/Throat: Oropharynx is clear and moist.  Neck: Neck supple.  Cardiovascular: Normal rate, regular rhythm and normal heart sounds.   Blood pressure recheck 148/92  Pulmonary/Chest: Effort normal and breath sounds normal.  Abdominal: Soft. Bowel sounds are normal.  Musculoskeletal: Normal range of motion.  Neurological: He is alert and oriented to person, place, and time. He has normal reflexes.  Skin: Skin is warm and dry.  Psychiatric: He has a normal mood and affect.          Assessment & Plan:  Assessment: 1. Hypertension-uncontrolled 2. Status post CVA 3. Anxiety  Plan: Start Paxil 20 mg once a day. Increased Avapro to 300 mg once daily. Continue all other medications the same. Advised him to take Valium prior to physical therapy appointment to help decrease his anxiety. Encouraged healthy diet, low-sodium. We'll bring patient back for recheck in 2-3 weeks and sooner as needed.

## 2013-02-02 ENCOUNTER — Ambulatory Visit: Payer: BC Managed Care – PPO | Admitting: Rehabilitative and Restorative Service Providers"

## 2013-02-05 ENCOUNTER — Other Ambulatory Visit: Payer: Self-pay

## 2013-02-07 ENCOUNTER — Ambulatory Visit: Payer: BC Managed Care – PPO | Admitting: Rehabilitative and Restorative Service Providers"

## 2013-02-09 ENCOUNTER — Ambulatory Visit: Payer: BC Managed Care – PPO | Admitting: Rehabilitative and Restorative Service Providers"

## 2013-02-10 ENCOUNTER — Ambulatory Visit: Payer: BC Managed Care – PPO | Admitting: Occupational Therapy

## 2013-02-10 ENCOUNTER — Telehealth: Payer: Self-pay | Admitting: Family

## 2013-02-10 DIAGNOSIS — I639 Cerebral infarction, unspecified: Secondary | ICD-10-CM

## 2013-02-10 NOTE — Telephone Encounter (Signed)
Ok to refer and dx??

## 2013-02-10 NOTE — Telephone Encounter (Signed)
Pt's wife called to follow up on a referral from [facility]. She thought he was to have referral to a neurologist. From the Jan 25, 2013 appt. pls advise

## 2013-02-16 ENCOUNTER — Ambulatory Visit (INDEPENDENT_AMBULATORY_CARE_PROVIDER_SITE_OTHER): Payer: BC Managed Care – PPO | Admitting: Neurology

## 2013-02-16 ENCOUNTER — Ambulatory Visit: Payer: BC Managed Care – PPO | Admitting: Occupational Therapy

## 2013-02-16 ENCOUNTER — Other Ambulatory Visit: Payer: Self-pay | Admitting: Internal Medicine

## 2013-02-16 ENCOUNTER — Encounter: Payer: Self-pay | Admitting: Neurology

## 2013-02-16 ENCOUNTER — Ambulatory Visit: Payer: BC Managed Care – PPO | Admitting: Rehabilitative and Restorative Service Providers"

## 2013-02-16 VITALS — BP 142/80 | HR 80 | Temp 98.0°F | Resp 12 | Ht 68.5 in | Wt 231.9 lb

## 2013-02-16 DIAGNOSIS — I10 Essential (primary) hypertension: Secondary | ICD-10-CM

## 2013-02-16 NOTE — Progress Notes (Signed)
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 This occurred in May of 2013. The ear doctor gave him exercises to do and he cleared up.  In the end of January, he was at work when he began feeling lightheaded and dizzy.  He switched jobs with another worker so he didn't have to go upstairs. He was driving home and he fell he had to pull over and then everything went white in his vision for several minutes. He was holding onto the truck door so as not to fall. He also phone and the medics were contacted. He had a blood pressure of around 212/114.  He was admitted to the hospital. 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 There is also the possibility of segmental dropout of the left vertebral artery. The right vertebral artery is dominant. There is no hemodynamically significant flow in the carotid arteries and there was no hemodynamic large vessel blockage noted on MRA scan..  He has now been taken out of work for probably 4-6 months.  He is almost 63 he may end up retiring depending on how much improved from the stroke.  He would be happy to go back to work, but his wife feels like this may be a sign that he should retire.  He still gets occasional lightheaded spells and sometimes some room spinning episodes.  His left hand may be slightly less coordinated than it was before.  He also had a sudden pulling sensation but he almost fell over her this morning without much of a warning of dizziness.  He is now walking with a cane for exercise for insecurity.  His wife states that sometimes she will take it at night because he stops breathing. He has not been tested for sleep apnea.  He also has a history of elevated creatinine levels in the past and he is borderline high more recently. He is taking his usual cholesterol and blood pressure medications and he checks his blood pressure at home. He is also taking aspirin 325 mg at this  time.  Review of symptoms is positive for EEG, mild low back discomfort occasional daytime sleepiness and occasional mild congestive disturbance. Remainder of review of symptoms is negative.  Past Medical History  Diagnosis Date  . 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  . GOUT 07/26/2007  . HYPERCHOLESTEROLEMIA, BORDERLINE 01/16/2010  . HYPERTENSION 07/26/2007  . OSTEOARTHRITIS 09/28/2007  . PLANTAR FASCIITIS, BILATERAL 04/19/2010  . PROSTATITIS, RECURRENT 11/08/2009  . RENAL INSUFFICIENCY 07/26/2007  . 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 URINARY FREQUENCY, CHRONIC 03/10/2008    Current Outpatient Prescriptions on File Prior to Visit  Medication Sig Dispense Refill  . amLODipine (NORVASC) 10 MG tablet Take 1 tablet (10 mg total) by mouth daily.  30 tablet  1  . aspirin EC 325 MG tablet Take 1 tablet (325 mg total) by mouth daily.    0  . diazepam (VALIUM) 5 MG tablet Take 5 mg by mouth every 6 (six) hours as needed. For anxiety      . Febuxostat (ULORIC) 80 MG TABS Take 80 mg by mouth daily.       . furosemide (LASIX) 40 MG tablet Take 40 mg by mouth daily.      . indomethacin (INDOCIN) 50 MG capsule Take 1 capsule (50 mg total) by mouth 3 (three) times daily with meals as needed (gouty pain).      Marland Kitchen  irbesartan (AVAPRO) 300 MG tablet Take 1 tablet (300 mg total) by mouth daily.  30 tablet  1  . labetalol (NORMODYNE) 200 MG tablet Take 200 mg by mouth 2 (two) times daily.      . metFORMIN (GLUCOPHAGE) 500 MG tablet Take 500 mg by mouth 2 (two) times daily with a meal.      . Multiple Vitamin (MULTIVITAMIN WITH MINERALS) TABS Take 1 tablet by mouth daily.      Marland Kitchen PARoxetine (PAXIL) 20 MG tablet Take 1 tablet (20 mg total) by mouth every morning.  30 tablet  2  . simvastatin (ZOCOR) 20 MG tablet Take 1 tablet (20 mg total) by mouth daily at 6 PM.  30 tablet  1   Current Facility-Administered Medications on File Prior to Visit  Medication Dose Route Frequency Provider  Last Rate Last Dose  . methylPREDNISolone acetate (DEPO-MEDROL) injection 40 mg  40 mg Intramuscular Once Burnice Logan, MD       Other  History   Social History  . Marital Status: Married    Spouse Name: N/A    Number of Children: N/A  . Years of Education: N/A   Occupational History  . Not on file.   Social History Main Topics  . Smoking status: Never Smoker   . Smokeless tobacco: Never Used  . Alcohol Use: No  . Drug Use: No  . Sexually Active: Yes   Other Topics Concern  . Not on file   Social History Narrative  . No narrative on file    Family History  Problem Relation Age of Onset  . Heart disease Mother   . Heart disease Father   . CVA Father   . Diabetes Sister     BP 142/80  Pulse 80  Temp(Src) 98 F (36.7 C)  Resp 12  Ht 5' 8.5" (1.74 m)  Wt 231 lb 14.4 oz (105.189 kg)  BMI 34.74 kg/m2   Alert and oriented x 3.  Memory function appears to be intact.  Concentration and attention are normal for educational level and background.  Speech is fluent and without significant word finding difficulty.    No carotid bruits detected.  Cranial nerve II through XII are within normal limits.  This includes normal optic discs and acuity, EOMI,, no nystagmus, PERLA, facial movement and sensation intact, hearing grossly intact, gag intact,Uvula raises symmetrically and tongue protrudes evenly. Motor strength is 5 over 5 throughout all limbs.  No atrophy, abnormal tone or tremors. Reflexes are 2+ and symmetric in the upper and lower extremities Sensory exam is intact. Coordination reveals a slight decrease of finger nose testing on the left. Gait and station are slightly hesitatant without gross ataxia and romberg is negative.  Some trouble with tiptoe walking.  Impression: 67 year old male with a history of elevated creatinines, chronic high blood pressure and three-week episode of vertigo now with imbalance, , vertigo and lightheadedness starting acutely, and MRI  suggest a diagnosis of a small vessel left cerebellar stroke.  He is improving.  His wife also reports observed apnea episodes.  He continues on a daily aspirin.  Plan: He can resume some of his dizzy exercises to see if he gets any benefit in terms of recovering his balance. Split study PSG 2 rule out sleep apnea and initiate trial of CPAP at home if needed. Continue daily aspirin Return in 6 weeks.

## 2013-02-16 NOTE — Patient Instructions (Addendum)
Your sleep study is scheduled at [facility] on Tuesday, March 18th at 8:00 pm.  You will receive additonal instructions in the mail.  Call [phone] if you cannot keep this appointment.  Follow up in our office in 6 weeks.   [id number]  Continue your current medications.

## 2013-02-18 ENCOUNTER — Ambulatory Visit: Payer: BC Managed Care – PPO | Admitting: Occupational Therapy

## 2013-02-18 ENCOUNTER — Ambulatory Visit: Payer: BC Managed Care – PPO | Admitting: Rehabilitative and Restorative Service Providers"

## 2013-02-21 ENCOUNTER — Ambulatory Visit: Payer: BC Managed Care – PPO | Admitting: Occupational Therapy

## 2013-02-21 ENCOUNTER — Ambulatory Visit: Payer: BC Managed Care – PPO | Attending: Internal Medicine | Admitting: Physical Therapy

## 2013-02-21 DIAGNOSIS — R42 Dizziness and giddiness: Secondary | ICD-10-CM | POA: Insufficient documentation

## 2013-02-21 DIAGNOSIS — R269 Unspecified abnormalities of gait and mobility: Secondary | ICD-10-CM | POA: Insufficient documentation

## 2013-02-21 DIAGNOSIS — I69998 Other sequelae following unspecified cerebrovascular disease: Secondary | ICD-10-CM | POA: Insufficient documentation

## 2013-02-21 DIAGNOSIS — Z5189 Encounter for other specified aftercare: Secondary | ICD-10-CM | POA: Insufficient documentation

## 2013-02-22 ENCOUNTER — Encounter: Payer: Self-pay | Admitting: Internal Medicine

## 2013-02-22 ENCOUNTER — Ambulatory Visit (INDEPENDENT_AMBULATORY_CARE_PROVIDER_SITE_OTHER): Payer: BC Managed Care – PPO | Admitting: Internal Medicine

## 2013-02-22 ENCOUNTER — Ambulatory Visit: Payer: BC Managed Care – PPO | Admitting: Family

## 2013-02-22 VITALS — BP 160/100 | HR 80 | Temp 98.3°F | Resp 16 | Ht 68.5 in | Wt 230.0 lb

## 2013-02-22 DIAGNOSIS — I6990 Unspecified sequelae of unspecified cerebrovascular disease: Secondary | ICD-10-CM

## 2013-02-22 DIAGNOSIS — E1165 Type 2 diabetes mellitus with hyperglycemia: Secondary | ICD-10-CM

## 2013-02-22 LAB — BASIC METABOLIC PANEL
BUN: 22 mg/dL (ref 6–23)
Calcium: 9.5 mg/dL (ref 8.4–10.5)
GFR: 43.43 mL/min — ABNORMAL LOW (ref >60.00)
Potassium: 3.9 mEq/L (ref 3.5–5.1)
Sodium: 141 mEq/L (ref 135–145)

## 2013-02-22 LAB — HEMOGLOBIN A1C: Hgb A1c MFr Bld: 6.5 % (ref 4.6–6.5)

## 2013-02-22 MED ORDER — LABETALOL HCL 300 MG PO TABS: 300.0000 mg | ORAL_TABLET | Freq: Two times a day (BID) | ORAL | Status: DC

## 2013-02-22 NOTE — Progress Notes (Signed)
Subjective:    Patient ID: Jared [name], male    DOB: 05-Sep-1946, 67 y.o.   MRN: [id number]  HPI  blood pressure has been the issue Non hemorrhagic stroke see on MRI PT has been difficult  and he is falling to the left  Has mild nausea with head movement and accommodation Has persistent dizziness on triple therapy for HTN blood pressure is "up and down" at home   Review of Systems  Constitutional: Positive for fatigue. Negative for fever.  HENT: Positive for hearing loss. Negative for congestion, neck pain and postnasal drip.   Eyes: Negative for discharge, redness and visual disturbance.  Respiratory: Negative for cough, shortness of breath and wheezing.   Cardiovascular: Negative for leg swelling.  Gastrointestinal: Negative for abdominal pain, constipation and abdominal distention.  Genitourinary: Negative for urgency and frequency.  Musculoskeletal: Positive for myalgias, arthralgias and gait problem. Negative for joint swelling.  Skin: Negative for color change and rash.  Neurological: Positive for weakness. Negative for light-headedness.  Hematological: Negative for adenopathy.  Psychiatric/Behavioral: Negative for behavioral problems.   Past Medical History  Diagnosis Date  . 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  . GOUT 07/26/2007  . HYPERCHOLESTEROLEMIA, BORDERLINE 01/16/2010  . HYPERTENSION 07/26/2007  . OSTEOARTHRITIS 09/28/2007  . PLANTAR FASCIITIS, BILATERAL 04/19/2010  . PROSTATITIS, RECURRENT 11/08/2009  . RENAL INSUFFICIENCY 07/26/2007  . 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 URINARY FREQUENCY, CHRONIC 03/10/2008    History   Social History  . Marital Status: Married    Spouse Name: N/A    Number of Children: N/A  . Years of Education: N/A   Occupational History  . Not on file.   Social History Main Topics  . Smoking status: Never Smoker   . Smokeless tobacco: Never Used  . Alcohol Use: No  . Drug Use: No  . Sexually  Active: Yes   Other Topics Concern  . Not on file   Social History Narrative  . No narrative on file    Past Surgical History  Procedure Laterality Date  . Rt arm surgery for gout      Family History  Problem Relation Age of Onset  . Heart disease Mother   . Heart disease Father   . CVA Father   . Diabetes Sister     Allergies  Allergen Reactions  . Other     Strawberries Cant breath    Current Outpatient Prescriptions on File Prior to Visit  Medication Sig Dispense Refill  . amLODipine (NORVASC) 10 MG tablet Take 1 tablet (10 mg total) by mouth daily.  30 tablet  1  . aspirin EC 325 MG tablet Take 1 tablet (325 mg total) by mouth daily.    0  . diazepam (VALIUM) 5 MG tablet Take 5 mg by mouth every 6 (six) hours as needed. For anxiety      . Febuxostat (ULORIC) 80 MG TABS Take 80 mg by mouth daily.       . furosemide (LASIX) 40 MG tablet Take 40 mg by mouth daily.      . indomethacin (INDOCIN) 50 MG capsule Take 1 capsule (50 mg total) by mouth 3 (three) times daily with meals as needed (gouty pain).      . irbesartan (AVAPRO) 300 MG tablet Take 1 tablet (300 mg total) by mouth daily.  30 tablet  1  . metFORMIN (GLUCOPHAGE) 500 MG tablet Take 500 mg by mouth 2 (two) times daily with a meal.      .  Multiple Vitamin (MULTIVITAMIN WITH MINERALS) TABS Take 1 tablet by mouth daily.      . simvastatin (ZOCOR) 20 MG tablet Take 1 tablet (20 mg total) by mouth daily at 6 PM.  30 tablet  1  . PARoxetine (PAXIL) 20 MG tablet Take 1 tablet (20 mg total) by mouth every morning.  30 tablet  2   Current Facility-Administered Medications on File Prior to Visit  Medication Dose Route Frequency [name] Last Rate Last Dose  . methylPREDNISolone acetate (DEPO-MEDROL) injection 40 mg  40 mg Intramuscular Once Burnice Logan, MD        BP 160/100  Pulse 80  Temp(Src) 98.3 F (36.8 C)  Resp 16  Ht 5' 8.5" (1.74 m)  Wt 230 lb (104.327 kg)  BMI 34.46 kg/m2       Objective:    Physical Exam  Nursing note and vitals reviewed. Constitutional: He appears well-developed and well-nourished.  HENT:  Head: Normocephalic and atraumatic.  Eyes: Conjunctivae are normal. Pupils are equal, round, and reactive to light.  Neck: Normal range of motion. Neck supple.  Cardiovascular: Normal rate and regular rhythm.   Pulmonary/Chest: Effort normal and breath sounds normal.  Abdominal: Soft. Bowel sounds are normal.  Neurological: He displays abnormal reflex. Coordination abnormal.          Assessment & Plan:  Patient is status post nonhemorrhagic stroke.  Control blood pressure and diabetes are essential for risk reduction.  We will increase his labetalol from 200-302 get more consistent blood pressure control and have urged him to follow a low-sodium diet.  We will measure hemoglobin A1c today to assess diabetic control outside of the hospital setting discussed with the patient and his daughter was factor reduction for repeat CVA and that controlled his cholesterol diabetes and hypertension were controlled the risk factors that we could lessen the chance of his having a second stroke

## 2013-02-22 NOTE — Patient Instructions (Addendum)
Increased the labetalol to 300 twice a day   Key for the next few months is to pay attention to every step and movement.   You need to apply for social security disability

## 2013-02-23 ENCOUNTER — Ambulatory Visit: Payer: BC Managed Care – PPO | Admitting: Occupational Therapy

## 2013-02-23 ENCOUNTER — Ambulatory Visit: Payer: BC Managed Care – PPO | Admitting: Rehabilitative and Restorative Service Providers"

## 2013-03-01 ENCOUNTER — Ambulatory Visit: Payer: BC Managed Care – PPO | Admitting: Rehabilitative and Restorative Service Providers"

## 2013-03-01 ENCOUNTER — Ambulatory Visit: Payer: BC Managed Care – PPO | Admitting: Occupational Therapy

## 2013-03-02 ENCOUNTER — Ambulatory Visit: Payer: BC Managed Care – PPO | Admitting: Occupational Therapy

## 2013-03-02 ENCOUNTER — Ambulatory Visit: Payer: BC Managed Care – PPO | Admitting: Rehabilitative and Restorative Service Providers"

## 2013-03-04 ENCOUNTER — Telehealth: Payer: Self-pay | Admitting: Internal Medicine

## 2013-03-04 MED ORDER — IRBESARTAN 300 MG PO TABS: 300.0000 mg | ORAL_TABLET | Freq: Every day | ORAL | Status: DC

## 2013-03-04 NOTE — Telephone Encounter (Addendum)
Pt states pharm called him and told pt to call doctor office before he refilled this again. Pls advise Padonda prescribed this for pt.   Med: irbesartan (AVAPRO) 300 MG tablet

## 2013-03-08 ENCOUNTER — Ambulatory Visit (HOSPITAL_BASED_OUTPATIENT_CLINIC_OR_DEPARTMENT_OTHER): Payer: BC Managed Care – PPO | Attending: Neurology

## 2013-03-08 VITALS — Ht 68.0 in | Wt 230.0 lb

## 2013-03-08 DIAGNOSIS — G4733 Obstructive sleep apnea (adult) (pediatric): Secondary | ICD-10-CM | POA: Insufficient documentation

## 2013-03-09 ENCOUNTER — Other Ambulatory Visit: Payer: Self-pay

## 2013-03-09 MED ORDER — METFORMIN HCL 500 MG PO TABS: 500.0000 mg | ORAL_TABLET | Freq: Two times a day (BID) | ORAL | Status: DC

## 2013-03-10 ENCOUNTER — Ambulatory Visit: Payer: BC Managed Care – PPO | Admitting: Occupational Therapy

## 2013-03-10 ENCOUNTER — Ambulatory Visit: Payer: BC Managed Care – PPO | Admitting: Rehabilitative and Restorative Service Providers"

## 2013-03-11 ENCOUNTER — Ambulatory Visit: Payer: BC Managed Care – PPO | Admitting: Occupational Therapy

## 2013-03-11 ENCOUNTER — Ambulatory Visit: Payer: BC Managed Care – PPO | Admitting: Rehabilitative and Restorative Service Providers"

## 2013-03-16 ENCOUNTER — Ambulatory Visit: Payer: BC Managed Care – PPO | Admitting: Rehabilitative and Restorative Service Providers"

## 2013-03-18 ENCOUNTER — Ambulatory Visit: Payer: BC Managed Care – PPO | Admitting: Occupational Therapy

## 2013-03-18 ENCOUNTER — Ambulatory Visit: Payer: BC Managed Care – PPO | Admitting: Rehabilitative and Restorative Service Providers"

## 2013-03-21 ENCOUNTER — Other Ambulatory Visit: Payer: Self-pay | Admitting: Internal Medicine

## 2013-03-22 DIAGNOSIS — G4710 Hypersomnia, unspecified: Secondary | ICD-10-CM

## 2013-03-22 DIAGNOSIS — G4730 Sleep apnea, unspecified: Secondary | ICD-10-CM

## 2013-03-22 NOTE — Procedures (Signed)
[name], Jared                 ACCOUNT NO.:  [id number]  MEDICAL RECORD NO.:  [id number]          PATIENT TYPE:  OUT  LOCATION:  SLEEP CENTER                 FACILITY:  [facility]  PHYSICIAN:  Kathee Delton, MD,FCCPDATE OF BIRTH:  Apr 22[birth year] 1948  DATE OF STUDY:  03/08/2013                           NOCTURNAL POLYSOMNOGRAM  REFERRING PHYSICIAN:  MICHAEL L. SOO  INDICATION FOR STUDY:  Hypersomnia with sleep apnea.  EPWORTH SLEEPINESS SCORE:  9.  SLEEP ARCHITECTURE:  The patient had a total sleep time of 297 minutes with no slow-wave sleep and only 17 minutes of REM.  Sleep onset latency was normal at 10 minutes, and REM onset was extremely prolonged.  Sleep efficiency was 88% during the diagnostic portion of the study, and 77% during the titration portion.  RESPIRATORY DATA:  The patient underwent a split-night protocol, where he was found to have 165 obstructive events in the 1st 121 minutes of sleep.  This gave him an apnea-hypopnea index of 82 events per hour during the diagnostic portion of the study.  The events occurred in all body positions, and there was loud snoring noted throughout.  By protocol, he was fitted with a medium ResMed Quattro FX full face mask, and CPAP titration was initiated.  He was increased to a level as high as 14 cm of water, and then changed over to bilevel for improved tolerance at higher pressures.  His bilevel was subsequently increased to 19/16, but continued to have breakthrough events during his REM. Unfortunately, there was not adequate time to find an optimal pressure.  OXYGEN DATA:  There was O2 desaturation as low as 77% with the patient's obstructive events.  CARDIAC DATA:  Rare PVC noted, but no clinically significant arrhythmias were seen.  MOVEMENTS-PARASOMNIA:  No significant leg jerks or other abnormal behaviors noted.  IMPRESSIONS-RECOMMENDATIONS: 1. Split night study reveals severe obstructive sleep apnea, with an     AHI of 82  events per hour and oxygen desaturation as low as 77%     during the diagnostic portion of the study.  The patient was then     fitted with a medium ResMed Quattro FX full face mask, and titrated     as high as 19/16 on bilevel.  He continued to have breakthrough     events, especially during REM.  Unfortunately, there was not     adequate time to find an optimal pressure.  The patient will either     need an autotitration at home, or a return to the sleep center for     formal titration.  Clinical correlation is suggested.  The patient     should also be encouraged to work aggressively on     weight loss. 2. Rare PVC noted, but no clinically significant arrhythmias were     seen.     Kathee Delton, MD,FCCP Diplomate, [place] Board of Sleep Medicine    [facility]/MEDQ  D:  03/22/2013 08:31:16  T:  03/22/2013 23:07:31  Job:  [id number]

## 2013-03-23 ENCOUNTER — Ambulatory Visit: Payer: BC Managed Care – PPO | Admitting: Rehabilitative and Restorative Service Providers"

## 2013-03-23 ENCOUNTER — Ambulatory Visit: Payer: BC Managed Care – PPO | Attending: Internal Medicine | Admitting: Occupational Therapy

## 2013-03-23 DIAGNOSIS — R42 Dizziness and giddiness: Secondary | ICD-10-CM | POA: Insufficient documentation

## 2013-03-23 DIAGNOSIS — R269 Unspecified abnormalities of gait and mobility: Secondary | ICD-10-CM | POA: Insufficient documentation

## 2013-03-23 DIAGNOSIS — Z5189 Encounter for other specified aftercare: Secondary | ICD-10-CM | POA: Insufficient documentation

## 2013-03-23 DIAGNOSIS — I69998 Other sequelae following unspecified cerebrovascular disease: Secondary | ICD-10-CM | POA: Insufficient documentation

## 2013-03-24 ENCOUNTER — Telehealth: Payer: Self-pay | Admitting: Neurology

## 2013-03-24 NOTE — Telephone Encounter (Signed)
Message from Dr. Tomma Rakers: "If Jared [name] would like, we can order auto BiPAP with maximum Inspiratory pressure 20, minimum Expiratory pressure 12 and minimum pressure support of 3. MS"  Called and spoke with the patient. Information give to him re: sleep study results and Dr. Rosalio Loud recommendation. I explained the referral process and the patient wanted to know if his insurance would pay for it. I told him I didn't know and that he should give them a call to inquire. He states he will and I then told him top call me back and let me know if he wished to move forward with the referral for the bi pap. The patient states he will.

## 2013-03-25 ENCOUNTER — Ambulatory Visit: Payer: BC Managed Care – PPO | Admitting: Rehabilitative and Restorative Service Providers"

## 2013-03-25 ENCOUNTER — Encounter: Payer: BC Managed Care – PPO | Admitting: Occupational Therapy

## 2013-03-28 NOTE — Telephone Encounter (Signed)
Called and left the patient a message to return my call re: referral for bi-pap.

## 2013-03-29 ENCOUNTER — Encounter: Payer: BC Managed Care – PPO | Admitting: Occupational Therapy

## 2013-03-29 ENCOUNTER — Ambulatory Visit: Payer: BC Managed Care – PPO | Admitting: Rehabilitative and Restorative Service Providers"

## 2013-03-29 NOTE — Telephone Encounter (Signed)
Called and spoke with the patient re: follow up appointment with Dr. Tomma Rakers tomorrow. I encouraged him to come in order to discuss the results of the sleep study and getting started on bi-pap. He states he will come.

## 2013-03-30 ENCOUNTER — Other Ambulatory Visit: Payer: Self-pay | Admitting: Internal Medicine

## 2013-03-30 ENCOUNTER — Other Ambulatory Visit: Payer: Self-pay | Admitting: Neurology

## 2013-03-30 ENCOUNTER — Encounter: Payer: Self-pay | Admitting: Neurology

## 2013-03-30 ENCOUNTER — Ambulatory Visit: Payer: BC Managed Care – PPO | Admitting: Neurology

## 2013-03-30 VITALS — BP 130/80 | HR 78 | Temp 97.9°F | Resp 16 | Ht 68.0 in | Wt 227.0 lb

## 2013-03-30 DIAGNOSIS — G4733 Obstructive sleep apnea (adult) (pediatric): Secondary | ICD-10-CM

## 2013-03-30 DIAGNOSIS — I639 Cerebral infarction, unspecified: Secondary | ICD-10-CM

## 2013-03-30 NOTE — Progress Notes (Unsigned)
Jared [name] returns for followup of cerebellar stroke.  He continues on his aspirin 325 mg. He has had no new symptoms. He continues to have difficulty with gait and balance and tremors of the left hand.  His PSG study shows very severe sleep apnea with an AHI of 82  And oxygen saturation in the 70s.  He had a split study and was titrated to BiPAP with a full face mask, but he preferred the nasal pillows.  As was his first night on BiPAP, I think it is reasonable to try the nasal pillows first which will be probably better tolerated and we can adjust the settings as needed.  They didn't come to a single best final pressure so we will go with auto BiPAP. He had dry mouth so we will go with a humidifier.    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  Review of systems is positive for joint pains, imbalance, occasional indigestion trouble and sleep disruption. Remainder of review of systems is negative.  Past Medical History  Diagnosis Date  . 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  . GOUT 07/26/2007  . HYPERCHOLESTEROLEMIA, BORDERLINE 01/16/2010  . HYPERTENSION 07/26/2007  . OSTEOARTHRITIS 09/28/2007  . PLANTAR FASCIITIS, BILATERAL 04/19/2010  . PROSTATITIS, RECURRENT 11/08/2009  . RENAL INSUFFICIENCY 07/26/2007  . 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 URINARY FREQUENCY, CHRONIC 03/10/2008    Current Outpatient Prescriptions on File Prior to Visit  Medication Sig Dispense Refill  . amLODipine (NORVASC) 10 MG tablet TAKE 1 TABLET BY MOUTH EVERY DAY  30 tablet  1  . aspirin EC 325 MG tablet Take 1 tablet (325 mg total) by mouth daily.    0  . diazepam (VALIUM) 5 MG tablet Take 5 mg by mouth every 6 (six) hours as needed. For anxiety      . Febuxostat (ULORIC) 80 MG TABS Take 80 mg by mouth daily.       . furosemide (LASIX) 40 MG tablet Take 40 mg by mouth daily.      . indomethacin (INDOCIN) 50 MG capsule Take  1 capsule (50 mg total) by mouth 3 (three) times daily with meals as needed (gouty pain).      . irbesartan (AVAPRO) 300 MG tablet Take 1 tablet (300 mg total) by mouth daily.  30 tablet  3  . labetalol (NORMODYNE) 300 MG tablet Take 1 tablet (300 mg total) by mouth 2 (two) times daily.  60 tablet  11  . metFORMIN (GLUCOPHAGE) 500 MG tablet Take 1 tablet (500 mg total) by mouth 2 (two) times daily with a meal.  90 tablet  1  . Multiple Vitamin (MULTIVITAMIN WITH MINERALS) TABS Take 1 tablet by mouth daily.      Marland Kitchen PARoxetine (PAXIL) 20 MG tablet Take 1 tablet (20 mg total) by mouth every morning.  30 tablet  2  . simvastatin (ZOCOR) 20 MG tablet TAKE 1 TABLET BY MOUTH EVERY DAY AT 6 PM  30 tablet  1   Current Facility-Administered Medications on File Prior to Visit  Medication Dose Route Frequency Provider Last Rate Last Dose  . methylPREDNISolone acetate (DEPO-MEDROL) injection 40 mg  40 mg Intramuscular Once Burnice Logan, MD        History   Social History  . Marital Status: Married    Spouse Name: N/A    Number of Children: N/A  . Years  of Education: N/A   Occupational History  . Not on file.   Social History Main Topics  . Smoking status: Never Smoker   . Smokeless tobacco: Never Used  . Alcohol Use: No     Comment: none  . Drug Use: No  . Sexually Active: Yes   Other Topics Concern  . Not on file   Social History Narrative  . No narrative on file    Family History  Problem Relation Age of Onset  . Heart disease Mother   . Heart disease Father   . CVA Father   . Diabetes Sister      BP 130/80  Pulse 78  Temp(Src) 97.9 F (36.6 C)  Resp 16  Ht 5\' 8"  (1.727 m)  Wt 227 lb (102.967 kg)  BMI 34.52 kg/m2   Alert and oriented x 3. Memory function appears to be intact. Concentration and attention are normal for educational level and background. Speech is fluent and without significant word finding difficulty.  No carotid bruits detected.  Cranial nerve II through  XII are within normal limits. This includes normal optic discs and acuity, EOMI,, no nystagmus, PERLA, facial movement and sensation intact, hearing grossly intact, gag intact,Uvula raises symmetrically and tongue protrudes evenly.  Motor strength is 5 over 5 throughout all limbs. No atrophy, abnormal tone or tremors.  Reflexes are 2+ and symmetric in the upper and lower extremities  Sensory exam is intact.  Coordination reveals a slight decrease of finger nose testing on the left.  Gait and station are slightly hesitatant without gross ataxia and romberg is negative. Some trouble with tiptoe walking.  Impression: 1. Very severe obstructive sleep apnea confirmed on PSG testing.  He was titrated on CPAP and BiPAP and auto BiPAP at home is one of the options.  Plan: Continue daily aspirin We will order a trial of auto BiPAP with a minimum expiratory pressure of 9, maximum inspiratory pressure of 16 and a minimum pressure support of 3 CM.  We will order this with a heated humidifier and nasal pillows. He'll return in 6-8 weeks to see how he is doing with his BiPAP trial.  Hopefully we will get a report from the BiPAP readout after 3-4 weeks so that we can adjust the settings if need be.

## 2013-03-30 NOTE — Patient Instructions (Addendum)
Follow up in our office in 6 to 8 weeks.  I will refer you to [facility] and they will call you to schedule an appointment to set you up for Bi-PAP.

## 2013-04-01 ENCOUNTER — Ambulatory Visit: Payer: BC Managed Care – PPO | Admitting: Rehabilitative and Restorative Service Providers"

## 2013-04-01 ENCOUNTER — Ambulatory Visit: Payer: BC Managed Care – PPO | Admitting: Occupational Therapy

## 2013-04-04 ENCOUNTER — Ambulatory Visit: Payer: BC Managed Care – PPO | Admitting: Occupational Therapy

## 2013-04-06 ENCOUNTER — Ambulatory Visit: Payer: BC Managed Care – PPO | Admitting: Rehabilitative and Restorative Service Providers"

## 2013-04-11 ENCOUNTER — Encounter: Payer: Self-pay | Admitting: *Deleted

## 2013-04-12 ENCOUNTER — Ambulatory Visit: Payer: BC Managed Care – PPO | Admitting: Occupational Therapy

## 2013-04-12 ENCOUNTER — Ambulatory Visit: Payer: BC Managed Care – PPO | Admitting: Physical Therapy

## 2013-04-12 ENCOUNTER — Other Ambulatory Visit: Payer: Self-pay | Admitting: *Deleted

## 2013-04-12 DIAGNOSIS — I10 Essential (primary) hypertension: Secondary | ICD-10-CM

## 2013-04-12 MED ORDER — LABETALOL HCL 300 MG PO TABS: 300.0000 mg | ORAL_TABLET | Freq: Two times a day (BID) | ORAL | Status: DC

## 2013-04-15 ENCOUNTER — Ambulatory Visit: Payer: BC Managed Care – PPO | Admitting: Occupational Therapy

## 2013-04-19 ENCOUNTER — Ambulatory Visit: Payer: BC Managed Care – PPO | Admitting: Occupational Therapy

## 2013-04-19 ENCOUNTER — Ambulatory Visit: Payer: BC Managed Care – PPO | Admitting: Rehabilitative and Restorative Service Providers"

## 2013-04-22 ENCOUNTER — Encounter: Payer: BC Managed Care – PPO | Admitting: Occupational Therapy

## 2013-04-25 ENCOUNTER — Other Ambulatory Visit: Payer: Self-pay | Admitting: *Deleted

## 2013-05-09 ENCOUNTER — Encounter: Payer: Self-pay | Admitting: Neurology

## 2013-05-09 ENCOUNTER — Ambulatory Visit (INDEPENDENT_AMBULATORY_CARE_PROVIDER_SITE_OTHER): Payer: BC Managed Care – PPO | Admitting: Neurology

## 2013-05-09 VITALS — BP 138/80 | HR 74 | Temp 98.2°F | Resp 12 | Ht 68.0 in | Wt 232.0 lb

## 2013-05-09 DIAGNOSIS — I639 Cerebral infarction, unspecified: Secondary | ICD-10-CM

## 2013-05-09 DIAGNOSIS — G4733 Obstructive sleep apnea (adult) (pediatric): Secondary | ICD-10-CM

## 2013-05-09 DIAGNOSIS — I6350 Cerebral infarction due to unspecified occlusion or stenosis of unspecified cerebral artery: Secondary | ICD-10-CM

## 2013-05-09 NOTE — Progress Notes (Signed)
Jared [name] returns for followup of cerebellar CVA and severe sleep apnea.  He is using his BiPAP machine every night, and he is going [facility] so that we will have a pronounced read in the next 24-48 hours.  He is waking up at 2:30 in the morning and it may be related to worry about financial issues.  When he goes to bed at 10 or 9 or 8 or 11, he still wakes up at 2:30.  He takes his Valium at around 7 or 8:00 5 mg and it may be wearing off around that time.  He doesn't really want to change the Valium medication.  He is still having imbalance and walks with his cane but has developed no new neurological symptoms.  He continues on his daily aspirin.  His blood pressure remains under good control.  He wishes to continue the CPAP therapy.  Review of systems is positive for the insomnia, and some worry pattern as well as occasional memory loss. Remainder of review of systems is negative.  Past Medical History  Diagnosis Date  . 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  . GOUT 07/26/2007  . HYPERCHOLESTEROLEMIA, BORDERLINE 01/16/2010  . HYPERTENSION 07/26/2007  . OSTEOARTHRITIS 09/28/2007  . PLANTAR FASCIITIS, BILATERAL 04/19/2010  . PROSTATITIS, RECURRENT 11/08/2009  . RENAL INSUFFICIENCY 07/26/2007  . 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 URINARY FREQUENCY, CHRONIC 03/10/2008    Current Outpatient Prescriptions on File Prior to Visit  Medication Sig Dispense Refill  . amLODipine (NORVASC) 10 MG tablet TAKE 1 TABLET BY MOUTH EVERY DAY  30 tablet  1  . aspirin EC 325 MG tablet Take 1 tablet (325 mg total) by mouth daily.    0  . diazepam (VALIUM) 5 MG tablet TAKE 1 TABLET BY MOUTH ONCE EVERY DAY AS NEEDED  30 tablet  3  . Febuxostat (ULORIC) 80 MG TABS Take 80 mg by mouth daily.       . furosemide (LASIX) 40 MG tablet Take 40 mg by mouth daily.      . indomethacin (INDOCIN) 50 MG capsule Take 1 capsule (50 mg total) by mouth 3 (three) times  daily with meals as needed (gouty pain).      . irbesartan (AVAPRO) 300 MG tablet Take 1 tablet (300 mg total) by mouth daily.  30 tablet  3  . labetalol (NORMODYNE) 300 MG tablet Take 1 tablet (300 mg total) by mouth 2 (two) times daily.  60 tablet  11  . metFORMIN (GLUCOPHAGE) 500 MG tablet Take 1 tablet (500 mg total) by mouth 2 (two) times daily with a meal.  90 tablet  1  . Multiple Vitamin (MULTIVITAMIN WITH MINERALS) TABS Take 1 tablet by mouth daily.      Marland Kitchen PARoxetine (PAXIL) 20 MG tablet Take 1 tablet (20 mg total) by mouth every morning.  30 tablet  2  . simvastatin (ZOCOR) 20 MG tablet TAKE 1 TABLET BY MOUTH EVERY DAY AT 6 PM  30 tablet  1   Current Facility-Administered Medications on File Prior to Visit  Medication Dose Route Frequency Provider Last Rate Last Dose  . methylPREDNISolone acetate (DEPO-MEDROL) injection 40 mg  40 mg Intramuscular Once Burnice Logan, MD       Other  History   Social History  . Marital Status: Married    Spouse Name: N/A    Number of Children: N/A  . Years of Education: N/A   Occupational  History  . Not on file.   Social History Main Topics  . Smoking status: Never Smoker   . Smokeless tobacco: Never Used  . Alcohol Use: No     Comment: none  . Drug Use: No  . Sexually Active: Yes   Other Topics Concern  . Not on file   Social History Narrative  . No narrative on file    Family History  Problem Relation Age of Onset  . Heart disease Mother   . Heart disease Father   . CVA Father   . Diabetes Sister     BP 138/80  Pulse 74  Temp(Src) 98.2 F (36.8 C)  Resp 12  Ht 5\' 8"  (1.727 m)  Wt 232 lb (105.235 kg)  BMI 35.28 kg/m2   Alert and oriented x 3.  Memory function appears to be fair.  Concentration and attention are normal for educational level and background.  Speech is fluent and without significant word finding difficulty.  Is aware of current events.  No carotid bruits detected.  Cranial nerve II through XII are  within normal limits.  This includes normal optic discs and acuity, EOMI, PERLA, facial movement and sensation intact, hearing grossly intact, gag intact,Uvula raises symmetrically and tongue protrudes evenly. Motor strength is 5 over 5 throughout all limbs.  No atrophy, abnormal tone or tremors. Reflexes are 2+ and symmetric in the upper and lower extremities Sensory exam is intact. Coordination is intact for fine movements and rapid alternating movements in all limbs Gait reveals a wide-based and somewhat ataxic gait.   Impression: Cerebellar stroke, now stable but with residual gait difficulty.  He continues on aspirin and his blood pressure is well controlled. Severe sleep apnea on BiPAP.  [facility] so that we should get some information as to how effective the BiPAP is a current pressure and whether he needs any further adjustments in the pressure.  Plan: Continue CPAP, continue aspirin and continue good blood pressure control. He'll followup in sleep apnea clinic in 3 months to make any further adjustment is necessary and to continue the CPAP therapy. For the stroke, he can return as needed.

## 2013-05-09 NOTE — Patient Instructions (Addendum)
We will refer you to [facility] at [facility]. They are located at [address]. They will contact you about your appointment.   [id number]

## 2013-05-10 ENCOUNTER — Other Ambulatory Visit: Payer: Self-pay | Admitting: Neurology

## 2013-05-10 DIAGNOSIS — G4733 Obstructive sleep apnea (adult) (pediatric): Secondary | ICD-10-CM

## 2013-05-17 ENCOUNTER — Other Ambulatory Visit: Payer: Self-pay | Admitting: Internal Medicine

## 2013-05-21 ENCOUNTER — Other Ambulatory Visit: Payer: Self-pay | Admitting: Family Medicine

## 2013-05-25 ENCOUNTER — Other Ambulatory Visit: Payer: Self-pay | Admitting: Family Medicine

## 2013-05-25 ENCOUNTER — Other Ambulatory Visit: Payer: Self-pay | Admitting: Internal Medicine

## 2013-06-03 ENCOUNTER — Encounter: Payer: Self-pay | Admitting: Internal Medicine

## 2013-06-03 ENCOUNTER — Ambulatory Visit (INDEPENDENT_AMBULATORY_CARE_PROVIDER_SITE_OTHER): Payer: BC Managed Care – PPO | Admitting: Internal Medicine

## 2013-06-03 VITALS — BP 120/84 | HR 80 | Temp 98.2°F | Resp 18 | Ht 68.0 in | Wt 231.0 lb

## 2013-06-03 DIAGNOSIS — T887XXA Unspecified adverse effect of drug or medicament, initial encounter: Secondary | ICD-10-CM

## 2013-06-03 DIAGNOSIS — G4710 Hypersomnia, unspecified: Secondary | ICD-10-CM

## 2013-06-03 DIAGNOSIS — I10 Essential (primary) hypertension: Secondary | ICD-10-CM

## 2013-06-03 LAB — BASIC METABOLIC PANEL
BUN: 23 mg/dL (ref 6–23)
GFR: 43.14 mL/min — ABNORMAL LOW (ref >60.00)
Glucose, Bld: 125 mg/dL — ABNORMAL HIGH (ref 70–99)
Potassium: 3.9 mEq/L (ref 3.5–5.1)

## 2013-06-03 LAB — HEPATIC FUNCTION PANEL
ALT: 15 U/L (ref 0–53)
Alkaline Phosphatase: 71 U/L (ref 39–117)
Bilirubin, Direct: 0.1 mg/dL (ref 0.0–0.3)
Total Protein: 7.3 g/dL (ref 6.0–8.3)

## 2013-06-03 NOTE — Progress Notes (Signed)
Subjective:    Patient ID: Jared [name], male    DOB: 09-Jul-1946, 67 y.o.   MRN: [id number]  HPI  folllow up of CVA Has days with weakness and head Aches Right sided forehead "naps" help the fatigue Blood sugars  He is not checking CBG's Last a1c was 6.5   Review of Systems  Constitutional: Negative for fever and fatigue.  HENT: Negative for hearing loss, congestion, neck pain and postnasal drip.   Eyes: Negative for discharge, redness and visual disturbance.  Respiratory: Negative for cough, shortness of breath and wheezing.   Cardiovascular: Negative for leg swelling.  Gastrointestinal: Negative for abdominal pain, constipation and abdominal distention.  Genitourinary: Negative for urgency and frequency.  Musculoskeletal: Negative for joint swelling and arthralgias.  Skin: Negative for color change and rash.  Neurological: Negative for weakness and light-headedness.  Hematological: Negative for adenopathy.  Psychiatric/Behavioral: Negative for behavioral problems.   Past Medical History  Diagnosis Date  . 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  . GOUT 07/26/2007  . HYPERCHOLESTEROLEMIA, BORDERLINE 01/16/2010  . HYPERTENSION 07/26/2007  . OSTEOARTHRITIS 09/28/2007  . PLANTAR FASCIITIS, BILATERAL 04/19/2010  . PROSTATITIS, RECURRENT 11/08/2009  . RENAL INSUFFICIENCY 07/26/2007  . 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 URINARY FREQUENCY, CHRONIC 03/10/2008    History   Social History  . Marital Status: Married    Spouse Name: N/A    Number of Children: N/A  . Years of Education: N/A   Occupational History  . Not on file.   Social History Main Topics  . Smoking status: Never Smoker   . Smokeless tobacco: Never Used  . Alcohol Use: No     Comment: none  . Drug Use: No  . Sexually Active: Yes   Other Topics Concern  . Not on file   Social History Narrative  . No narrative on file    Past Surgical History  Procedure Laterality Date   . Rt arm surgery for gout      Family History  Problem Relation Age of Onset  . Heart disease Mother   . Heart disease Father   . CVA Father   . Diabetes Sister     Allergies  Allergen Reactions  . Other     Strawberries Cant breath    Current Outpatient Prescriptions on File Prior to Visit  Medication Sig Dispense Refill  . amLODipine (NORVASC) 10 MG tablet TAKE 1 TABLET BY MOUTH EVERY DAY  90 tablet  3  . aspirin EC 325 MG tablet Take 1 tablet (325 mg total) by mouth daily.    0  . diazepam (VALIUM) 5 MG tablet TAKE 1 TABLET BY MOUTH ONCE EVERY DAY AS NEEDED  30 tablet  3  . Febuxostat (ULORIC) 80 MG TABS Take 80 mg by mouth daily.       . furosemide (LASIX) 40 MG tablet Take 1 tablet (40 mg total) by mouth daily. PATIENT NEEDS OFFICE VISIT FOR ADDITIONAL REFILLS  30 tablet  0  . indomethacin (INDOCIN) 50 MG capsule Take 1 capsule (50 mg total) by mouth 3 (three) times daily with meals as needed (gouty pain).      . irbesartan (AVAPRO) 300 MG tablet Take 1 tablet (300 mg total) by mouth daily.  30 tablet  3  . labetalol (NORMODYNE) 300 MG tablet Take 1 tablet (300 mg total) by mouth 2 (two) times daily.  60 tablet  11  . lisinopril (PRINIVIL,ZESTRIL) 40 MG tablet TAKE 1 TABLET BY MOUTH  EVERY DAY  90 tablet  3  . metFORMIN (GLUCOPHAGE) 500 MG tablet Take 1 tablet (500 mg total) by mouth 2 (two) times daily with a meal.  90 tablet  1  . Multiple Vitamin (MULTIVITAMIN WITH MINERALS) TABS Take 1 tablet by mouth daily.      . simvastatin (ZOCOR) 20 MG tablet TAKE 1 TABLET BY MOUTH EVERY DAY AT 6 PM  90 tablet  3   Current Facility-Administered Medications on File Prior to Visit  Medication Dose Route Frequency Provider Last Rate Last Dose  . methylPREDNISolone acetate (DEPO-MEDROL) injection 40 mg  40 mg Intramuscular Once Burnice Logan, MD        BP 120/84  Pulse 80  Temp(Src) 98.2 F (36.8 C)  Resp 18  Ht 5\' 8"  (1.727 m)  Wt 231 lb (104.781 kg)  BMI 35.13  kg/m2        Objective:   Physical Exam  Nursing note and vitals reviewed. Constitutional: He appears well-developed and well-nourished.  HENT:  Head: Normocephalic and atraumatic.  Eyes: Conjunctivae are normal. Pupils are equal, round, and reactive to light.  Neck: Normal range of motion. Neck supple.  Cardiovascular: Normal rate and regular rhythm.   Pulmonary/Chest: Effort normal and breath sounds normal.  Abdominal: Soft. Bowel sounds are normal.          Assessment & Plan:  Discussion of temperature intolerance and medications Weakness and lack of exercise DM monitoring

## 2013-06-03 NOTE — Patient Instructions (Signed)
Call and see if the CPAP machine needs to be adjusted

## 2013-06-08 ENCOUNTER — Telehealth: Payer: Self-pay | Admitting: Internal Medicine

## 2013-06-08 NOTE — Telephone Encounter (Signed)
[place] contacted [place] in regard to pt. Pt was contacted Monday by Arville Go and stated that he would call back because he wanted to call our office because he did not know anything about home health coming out to see him. Pt has not called back and or answered any of the calls and messages left for him. Per Roxanne since it has been over 48 hours with no contact pt will need a new order of he still wants homehealth

## 2013-06-10 NOTE — Telephone Encounter (Signed)
Called pt no answer °

## 2013-06-10 NOTE — Telephone Encounter (Signed)
Pt returning your call

## 2013-06-10 NOTE — Telephone Encounter (Signed)
Left message on machine For over night oximetry to determine adjustment-pt is to call back if he wants that

## 2013-06-16 ENCOUNTER — Encounter: Payer: Self-pay | Admitting: Neurology

## 2013-06-17 ENCOUNTER — Other Ambulatory Visit: Payer: Self-pay | Admitting: Family Medicine

## 2013-06-17 ENCOUNTER — Institutional Professional Consult (permissible substitution): Payer: BC Managed Care – PPO | Admitting: Neurology

## 2013-06-23 ENCOUNTER — Other Ambulatory Visit: Payer: Self-pay | Admitting: Internal Medicine

## 2013-07-06 ENCOUNTER — Telehealth: Payer: Self-pay | Admitting: Internal Medicine

## 2013-07-06 NOTE — Telephone Encounter (Signed)
Pt following up on paperwork dropped off 7/8. Pt would like that faxed to: [facility] at  [phone].  (This is a different fax number than was on previous request) Pt has not had a check in 3 weeks and is waiting on this paperwork to get to [facility].

## 2013-07-06 NOTE — Telephone Encounter (Signed)
Pt is going to find forms- we do not have and bring to office

## 2013-07-15 ENCOUNTER — Encounter: Payer: Self-pay | Admitting: Neurology

## 2013-07-20 ENCOUNTER — Other Ambulatory Visit: Payer: Self-pay | Admitting: Family

## 2013-08-02 ENCOUNTER — Ambulatory Visit (INDEPENDENT_AMBULATORY_CARE_PROVIDER_SITE_OTHER): Payer: BC Managed Care – PPO | Admitting: Family

## 2013-08-02 ENCOUNTER — Encounter: Payer: Self-pay | Admitting: Family

## 2013-08-02 VITALS — BP 120/84 | HR 66 | Wt 234.0 lb

## 2013-08-02 DIAGNOSIS — E7800 Pure hypercholesterolemia, unspecified: Secondary | ICD-10-CM

## 2013-08-02 DIAGNOSIS — N289 Disorder of kidney and ureter, unspecified: Secondary | ICD-10-CM

## 2013-08-02 DIAGNOSIS — E1129 Type 2 diabetes mellitus with other diabetic kidney complication: Secondary | ICD-10-CM

## 2013-08-02 DIAGNOSIS — N058 Unspecified nephritic syndrome with other morphologic changes: Secondary | ICD-10-CM

## 2013-08-02 DIAGNOSIS — I10 Essential (primary) hypertension: Secondary | ICD-10-CM

## 2013-08-02 MED ORDER — FUROSEMIDE 20 MG PO TABS: 20.0000 mg | ORAL_TABLET | Freq: Every day | ORAL | Status: DC

## 2013-08-02 NOTE — Progress Notes (Signed)
Subjective:    Patient ID: Jared [name], male    DOB: 1946/07/07, 67 y.o.   MRN: [id number]  HPI 67 year old [place] American male, nonsmoker, patient of Dr. Arnoldo Morale is in for recheck. He has a history of type 2 diabetes, cerebral infarction, hypertension, hyperlipidemia. He is currently tolerating his medications well. Has concerns of home stress related to his marriage. He reports his wife constantly stresses him financially and wants a bigger house. However, since he had the stroke he is unable to provide as much as he was financially. Overall, he feels that this affects his general health. He has a good support system with his children. He is considering separating.   Review of Systems  Constitutional: Negative.   HENT: Negative.   Respiratory: Negative.   Cardiovascular: Negative.   Gastrointestinal: Negative.   Endocrine: Negative.   Genitourinary: Negative.   Musculoskeletal: Negative.        Reports feelings of being off balance at times but overall has improved.  Skin: Negative.   Neurological: Negative.   Hematological: Negative.   Psychiatric/Behavioral: Negative.    Past Medical History  Diagnosis Date  . 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  . GOUT 07/26/2007  . HYPERCHOLESTEROLEMIA, BORDERLINE 01/16/2010  . HYPERTENSION 07/26/2007  . OSTEOARTHRITIS 09/28/2007  . PLANTAR FASCIITIS, BILATERAL 04/19/2010  . PROSTATITIS, RECURRENT 11/08/2009  . RENAL INSUFFICIENCY 07/26/2007  . 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 URINARY FREQUENCY, CHRONIC 03/10/2008    History   Social History  . Marital Status: Married    Spouse Name: N/A    Number of Children: N/A  . Years of Education: N/A   Occupational History  . Not on file.   Social History Main Topics  . Smoking status: Never Smoker   . Smokeless tobacco: Never Used  . Alcohol Use: No     Comment: none  . Drug Use: No  . Sexual Activity: Yes   Other Topics Concern  . Not on file    Social History Narrative  . No narrative on file    Past Surgical History  Procedure Laterality Date  . Rt arm surgery for gout      Family History  Problem Relation Age of Onset  . Heart disease Mother   . Heart disease Father   . CVA Father   . Diabetes Sister     Allergies  Allergen Reactions  . Other     Strawberries Cant breath    Current Outpatient Prescriptions on File Prior to Visit  Medication Sig Dispense Refill  . amLODipine (NORVASC) 10 MG tablet TAKE 1 TABLET BY MOUTH EVERY DAY  90 tablet  3  . aspirin EC 325 MG tablet Take 1 tablet (325 mg total) by mouth daily.    0  . CIALIS 5 MG tablet TAKE 1 TABLET BY MOUTH EVERY DAY  30 tablet  6  . diazepam (VALIUM) 5 MG tablet TAKE 1 TABLET BY MOUTH ONCE EVERY DAY AS NEEDED  30 tablet  3  . Febuxostat (ULORIC) 80 MG TABS Take 80 mg by mouth daily.       . indomethacin (INDOCIN) 50 MG capsule TAKE 1 CAPSULE THREE TIMES A DAY  270 capsule  0  . irbesartan (AVAPRO) 300 MG tablet TAKE 1 TABLET BY MOUTH EVERY DAY  30 tablet  11  . irbesartan (AVAPRO) 300 MG tablet TAKE 1 TABLET BY MOUTH EVERY DAY  30 tablet  3  . labetalol (NORMODYNE) 300 MG tablet Take  1 tablet (300 mg total) by mouth 2 (two) times daily.  60 tablet  11  . lisinopril (PRINIVIL,ZESTRIL) 40 MG tablet TAKE 1 TABLET BY MOUTH EVERY DAY  90 tablet  3  . metFORMIN (GLUCOPHAGE) 500 MG tablet TAKE 1 TABLET BY MOUTH TWICE A DAY WITH A MEAL  90 tablet  0  . Multiple Vitamin (MULTIVITAMIN WITH MINERALS) TABS Take 1 tablet by mouth daily.      . simvastatin (ZOCOR) 20 MG tablet TAKE 1 TABLET BY MOUTH EVERY DAY AT 6 PM  90 tablet  3   Current Facility-Administered Medications on File Prior to Visit  Medication Dose Route Frequency Provider Last Rate Last Dose  . methylPREDNISolone acetate (DEPO-MEDROL) injection 40 mg  40 mg Intramuscular Once Burnice Logan, MD        BP 120/84  Pulse 66  Wt 234 lb (106.142 kg)  BMI 35.59 kg/m2chart    Objective:   Physical  Exam  Constitutional: He is oriented to person, place, and time. He appears well-developed and well-nourished.  HENT:  Right Ear: External ear normal.  Left Ear: External ear normal.  Nose: Nose normal.  Eyes: Conjunctivae are normal. Pupils are equal, round, and reactive to light.  Neck: Normal range of motion. Neck supple. No thyromegaly present.  Cardiovascular: Normal rate, regular rhythm and normal heart sounds.   Pulmonary/Chest: Effort normal and breath sounds normal.  Abdominal: Soft. Bowel sounds are normal. He exhibits no distension. There is no tenderness. There is no rebound and no guarding.  Musculoskeletal: Normal range of motion.  Ambulating without an assistive device today  Neurological: He is alert and oriented to person, place, and time. He has normal reflexes.  Skin: Skin is warm and dry.  Psychiatric: He has a normal mood and affect.          Assessment & Plan:  Assessment: 1. Hypertension 2. Hyperlipidemia 3. Type 2 diabetes 4. History of CVA 5. Fall risk 6. Peripheral edema-improving   Plan: She continue current medications. We'll followup with patient in in 3 months to reassess peripheral edema since we decreased the Lasix to 20 mg daily. Hopefully this will help his renal insufficiency as well. Strongly encouraged him to consider counseling. Call with any questions or concerns recheck here.

## 2013-08-07 ENCOUNTER — Other Ambulatory Visit: Payer: Self-pay | Admitting: Family Medicine

## 2013-08-09 ENCOUNTER — Other Ambulatory Visit: Payer: Self-pay | Admitting: Internal Medicine

## 2013-08-25 ENCOUNTER — Other Ambulatory Visit: Payer: Self-pay | Admitting: Internal Medicine

## 2013-09-28 ENCOUNTER — Ambulatory Visit: Payer: BC Managed Care – PPO

## 2013-09-28 ENCOUNTER — Ambulatory Visit (INDEPENDENT_AMBULATORY_CARE_PROVIDER_SITE_OTHER): Payer: BC Managed Care – PPO | Admitting: Physician Assistant

## 2013-09-28 VITALS — BP 138/84 | HR 82 | Temp 99.2°F | Resp 18 | Ht 68.75 in | Wt 233.8 lb

## 2013-09-28 DIAGNOSIS — Z23 Encounter for immunization: Secondary | ICD-10-CM

## 2013-09-28 DIAGNOSIS — Z8673 Personal history of transient ischemic attack (TIA), and cerebral infarction without residual deficits: Secondary | ICD-10-CM

## 2013-09-28 DIAGNOSIS — I10 Essential (primary) hypertension: Secondary | ICD-10-CM

## 2013-09-28 DIAGNOSIS — R7309 Other abnormal glucose: Secondary | ICD-10-CM

## 2013-09-28 DIAGNOSIS — L02415 Cutaneous abscess of right lower limb: Secondary | ICD-10-CM

## 2013-09-28 DIAGNOSIS — R42 Dizziness and giddiness: Secondary | ICD-10-CM

## 2013-09-28 DIAGNOSIS — R7302 Impaired glucose tolerance (oral): Secondary | ICD-10-CM

## 2013-09-28 DIAGNOSIS — L02419 Cutaneous abscess of limb, unspecified: Secondary | ICD-10-CM

## 2013-09-28 LAB — POCT CBC
Hemoglobin: 13.7 g/dL — AB (ref 14.1–18.1)
MCH, POC: 29.7 pg (ref 27–31.2)
MCV: 90.5 fL (ref 80–97)
MID (cbc): 0.6 (ref 0–0.9)
RBC: 4.61 M/uL — AB (ref 4.69–6.13)
WBC: 8.6 10*3/uL (ref 4.6–10.2)

## 2013-09-28 MED ORDER — DOXYCYCLINE HYCLATE 100 MG PO CAPS: 100.0000 mg | ORAL_CAPSULE | Freq: Two times a day (BID) | ORAL | Status: DC

## 2013-09-28 NOTE — Progress Notes (Signed)
Patient ID: Jared [name] MRN: [id number], DOB: January 23, 1946, 67 y.o. Date of Encounter: 09/28/2013, 7:18 PM  Primary Physician: Georgetta Haber, MD  Chief Complaint: Dizziness the previous night at 3 AM and right a bump along his right thigh x 3-4 days.   HPI: 67 y.o. male with history of stoke, uncontrolled hypertension, impaired glucose tolerance, hypercholesteremia, and renal insufficiency, presents with 2 issues to discuss.   1) Dizziness: Patient states he woke up around 3 AM with a little neck soreness. This was followed by 5-10 minutes of dizziness. The dizziness self resolved. He has not had any further dizziness. Dizziness was associated with some sweating. No chest pain, chest tightness, SOB, nausea, arm pain, shoulder pain, neck pain, vomiting, tachycardia, palpitations, abdominal pain, vision changes, slurred speech, or numbness or weakness in his extremities. He does not check his blood sugars at home. Review of his prior A1C's reveals an A1C of 6.1 in June. States that he is not a diabetic, that he just has impaired glucose tolerance.   He states that he has felt fine all day today, but that he just wanted to come in tonight and have this checked out. He states that his blood pressures at home run between 160-170/92-96 or so. He takes his medications as directed.   Carotid doppler done on 01/22/13: Bilateral: mild soft plaque distal CCA and origin ICA. No significant ICA stenosis. Vertebral artery flow is antegrade.  2D echo done 01/22/13: - Left ventricle: The cavity size was normal. Wall thickness was normal. Systolic function was normal. The estimated ejection fraction was in the range of 60% to 65%. Wall motion was normal; there were no regional wall motion abnormalities. Doppler parameters are consistent with abnormal left ventricular relaxation (grade 1 diastolic dysfunction). Doppler parameters are consistent with high ventricular filling pressure. - Mitral valve: Mild  regurgitation. - Left atrium: The atrium was moderately dilated. - Atrial septum: No defect or patent foramen ovale was identified. Impressions: - No cardiac source of emboli was indentified. Recommendations: Consider transesophageal echocardiography if clinically indicated in order to exclude intracardiac thrombus. Transthoracic echocardiography. M-mode, complete 2D, spectral Doppler, and color Doppler. Height: Height: 175.3cm. Height: 69in. Weight: Weight: 105.2kg. Weight: 231.5lb. Body mass index: BMI: 34.3kg/m^2. Body surface area: BSA: 2.20m^2. Blood pressure: 148/95. Patient status: Inpatient. Location: Bedside.  2) Bump along outside of right leg: Patient states he has had a sore erythematous enlarging bump along the outside of his right leg just distal to his hip for the past 3-4 days. Afebrile. No chills. Has had sebaceous cysts previously. No drainage. Pants rub along the lesion causing increasing soreness. No symptoms down into the leg.   3) Requests influenza vaccine.    Past Medical History  Diagnosis Date  . 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  . GOUT 07/26/2007  . HYPERCHOLESTEROLEMIA, BORDERLINE 01/16/2010  . HYPERTENSION 07/26/2007  . OSTEOARTHRITIS 09/28/2007  . PLANTAR FASCIITIS, BILATERAL 04/19/2010  . PROSTATITIS, RECURRENT 11/08/2009  . RENAL INSUFFICIENCY 07/26/2007  . 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 URINARY FREQUENCY, CHRONIC 03/10/2008  . Stroke 01/21/2013     Home Meds: Prior to Admission medications   Medication Sig Start Date End Date Taking? Authorizing Provider  amLODipine (NORVASC) 10 MG tablet TAKE 1 TABLET BY MOUTH EVERY DAY 05/25/13  Yes Ricard Dillon, MD  aspirin EC 325 MG tablet Take 1 tablet (325 mg total) by mouth daily. 01/23/13  Yes Barton Dubois, MD  diazepam (VALIUM) 5 MG tablet TAKE 1  TABLET BY MOUTH EVERY DAY AS NEEDED 08/25/13  Yes Ricard Dillon, MD  furosemide (LASIX) 20 MG tablet Take 1 tablet (20 mg total) by mouth  daily. PATIENT NEEDS OFFICE VISIT FOR ADDITIONAL REFILLS 08/02/13  Yes Timoteo Gaul, FNP  indomethacin (INDOCIN) 50 MG capsule TAKE 1 CAPSULE THREE TIMES A DAY 06/23/13  Yes Ricard Dillon, MD  irbesartan (AVAPRO) 300 MG tablet TAKE 1 TABLET BY MOUTH EVERY DAY 07/20/13  Yes Ricard Dillon, MD  irbesartan (AVAPRO) 300 MG tablet TAKE 1 TABLET BY MOUTH EVERY DAY 07/20/13  Yes Ricard Dillon, MD  labetalol (NORMODYNE) 300 MG tablet Take 1 tablet (300 mg total) by mouth 2 (two) times daily. 04/12/13  Yes Ricard Dillon, MD  lisinopril (PRINIVIL,ZESTRIL) 40 MG tablet TAKE 1 TABLET BY MOUTH EVERY DAY 05/25/13  Yes Ricard Dillon, MD  metFORMIN (GLUCOPHAGE) 500 MG tablet TAKE 1 TABLET BY MOUTH TWICE A DAY WITH A MEAL 08/09/13  Yes Ricard Dillon, MD  Multiple Vitamin (MULTIVITAMIN WITH MINERALS) TABS Take 1 tablet by mouth daily.   Yes Historical Provider, MD  simvastatin (ZOCOR) 20 MG tablet TAKE 1 TABLET BY MOUTH EVERY DAY AT 6 PM 05/25/13  Yes Ricard Dillon, MD  CIALIS 5 MG tablet TAKE 1 TABLET BY MOUTH EVERY DAY 06/23/13   Ricard Dillon, MD  Febuxostat (ULORIC) 80 MG TABS Take 80 mg by mouth daily.     Historical Provider, MD    Allergies:  Allergies  Allergen Reactions  . Other     Strawberries Cant breath    History   Social History  . Marital Status: Married    Spouse Name: N/A    Number of Children: N/A  . Years of Education: N/A   Occupational History  . Not on file.   Social History Main Topics  . Smoking status: Never Smoker   . Smokeless tobacco: Never Used  . Alcohol Use: No     Comment: none  . Drug Use: No  . Sexual Activity: Yes   Other Topics Concern  . Not on file   Social History Narrative  . No narrative on file     Review of Systems: Constitutional: negative for chills, fever, or fatigue  HEENT: negative for vision changes, hearing loss, congestion, rhinorrhea, ST, or sinus pressure Cardiovascular: negative for chest pain, chest tightness, tachycardia, edema,  or palpitations Respiratory: negative for wheezing, shortness of breath, or cough Abdominal: negative for abdominal pain, nausea, vomiting, or diarrhea Dermatological: positive for erythema and swelling. negative for rash Neurologic: positive for dizziness. negative for headache, paresthesias, or syncope   Physical Exam: Blood pressure 138/84, pulse 82, temperature 99.2 F (37.3 C), temperature source Oral, resp. rate 18, height 5' 8.75" (1.746 m), weight 233 lb 12.8 oz (106.051 kg), SpO2 100.00%., Body mass index is 34.79 kg/(m^2). General: Well developed, well nourished, in no acute distress. Head: Normocephalic, atraumatic, eyes without discharge, sclera non-icteric, nares are without discharge. Bilateral auditory canals clear, TM's are without perforation, pearly grey and translucent with reflective cone of light bilaterally. Oral cavity moist, posterior pharynx without exudate, erythema, peritonsillar abscess, or post nasal drip.  Neck: Supple. No thyromegaly. Full ROM. No lymphadenopathy. No carotid bruits. No nuchal rigidity.  Lungs: Clear bilaterally to auscultation without wheezes, rales, or rhonchi. Breathing is unlabored. Heart: RRR with S1 S2. No murmurs, rubs, or gallops appreciated. Msk:  Strength and tone normal for age. Extremities/Skin: Warm and dry. No clubbing or cyanosis.  No edema. No rashes. 4 cm indurated erythematous lesion with surrounding erythema out to 5 cm total. Central fluctuance. Small pore along the posterior aspect. Lesion exhibits TTP.  Neuro: Alert and oriented X 3. Moves all extremities spontaneously. Gait is normal. CNII-XII grossly in tact. Rhomberg normal. Unremarkable cerebellar function. No nystagmus.  Psych:  Responds to questions appropriately with a normal affect.   EKG: Nonspecific T wave changes v5 and v6. Read by Dr. Joseph Art.   Labs: Results for orders placed in visit on 09/28/13  POCT CBC      Result Value Range   WBC 8.6  4.6 - 10.2 K/uL     Lymph, poc 1.3  0.6 - 3.4   POC LYMPH PERCENT 15.4  10 - 50 %L   MID (cbc) 0.6  0 - 0.9   POC MID % 6.9  0 - 12 %M   POC Granulocyte 6.7  2 - 6.9   Granulocyte percent 77.7  37 - 80 %G   RBC 4.61 (*) 4.69 - 6.13 M/uL   Hemoglobin 13.7 (*) 14.1 - 18.1 g/dL   HCT, POC 41.7 (*) 43.5 - 53.7 %   MCV 90.5  80 - 97 fL   MCH, POC 29.7  27 - 31.2 pg   MCHC 32.9  31.8 - 35.4 g/dL   RDW, POC 13.7     Platelet Count, POC 291  142 - 424 K/uL   MPV 8.9  0 - 99.8 fL  GLUCOSE, POCT (MANUAL RESULT ENTRY)      Result Value Range   POC Glucose 116 (*) 70 - 99 mg/dl  POCT GLYCOSYLATED HEMOGLOBIN (HGB A1C)      Result Value Range   Hemoglobin A1C 5.4       PROCEDURE NOTE: Verbal consent obtained. Risks and benefits of the procedure were explained to the patient. Patient made an informed decision to proceed with the procedure. Betadine prep per usual protocol. Local anesthesia obtained with 1% lidocaine with epi, 2 cc  2 cm incision made with 11 blade along lesion.  Culture taken. Moderate amount of purulence expressed from anterior aspect of wound. Moderate amount of sebaceous material expressed from posterior aspect of wound. Sac wall removed. Irrigated with saline.  Lesion explored revealing no loculations. Packed with 1/4 inch plain packing.  Dressed. Wound care instructions including precautions with patient. Patient tolerated the procedure well. Recheck in 48 hours.      ASSESSMENT AND PLAN:  67 y.o. male with uncontrolled hypertension and cellulitis/absess of the right thigh.  1) Uncontrolled hypertension/impaired glucose tolerance/dizziness/history of stroke -Blood pressure is controlled in the office tonight -Advised patient to continue daily BP readings at home. If he continues to see readings in the 160-170/90's range RTC and bring the cuff with him.  -Discussed in detail with him and his wife that something ominous is just waiting to happen to him. Advised him this may be  another stroke or possibly an MI. This may be a life changing event or may cause death. He understands this. Advised patient that currently he is just walking through life blind. He agrees with this. He is ready to move forward with further evaluation and treatment.  -Referral to cardiology for risk stratification  -Consider neurology referral -RTC/ER precautions -Will be closely following blood pressures while we are treating his cellulitis/abscess -Patient has been asymptomatic all day -Continue to abstain from intercourse until risk stratification  -Discussed with Dr. Joseph Art   2) Cellulitis/abscess of the right thigh -I  and D per above -Doxycycline 100 mg 1 po bid #20 no RF -RTC 48 hours -Wound care -Daily dressing changes -Patient tolerated procedure well  3) Influenza vaccine given today   Signed, Christell Faith, PA-C [facility], [place] [address] [phone] 09/28/2013 7:18 PM

## 2013-09-30 ENCOUNTER — Ambulatory Visit (INDEPENDENT_AMBULATORY_CARE_PROVIDER_SITE_OTHER): Payer: BC Managed Care – PPO | Admitting: Family Medicine

## 2013-09-30 VITALS — BP 148/80 | HR 73 | Temp 98.1°F | Resp 18 | Ht 68.75 in | Wt 233.0 lb

## 2013-09-30 DIAGNOSIS — Z5189 Encounter for other specified aftercare: Secondary | ICD-10-CM

## 2013-09-30 NOTE — Progress Notes (Signed)
[facility] [address] [place] [address] [phone]  Date:  09/30/2013   Name:  Jared [name]   DOB:  [birth year]   MRN:  [id number]  PCP:  Georgetta Haber, MD    Chief Complaint: Wound Check   History of Present Illness:  Jared [name] is a 67 y.o. very pleasant male patient who presents with the following:  Here for wound care- had an I and D of an absces so the right hip 2 days ago per Christell Faith, PA-C.  He feels that the area is doing well and is less sore than it had been He is not having much pain at this time.   He would also like for [place] to check his home BP cuff against our cuff.    Patient Active Problem List   Diagnosis Date Noted  . CVA (cerebral infarction) 01/21/2013  . Diabetes mellitus 01/21/2013  . Knee pain, bilateral 02/19/2011  . PLANTAR FASCIITIS, BILATERAL 04/19/2010  . HYPERCHOLESTEROLEMIA, BORDERLINE 01/16/2010  . UNSPECIFIED DISORDER OF LIPOID METABOLISM 11/08/2009  . 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  . PROSTATITIS, RECURRENT 11/08/2009  . URINARY FREQUENCY, CHRONIC 03/10/2008  . OSTEOARTHRITIS 09/28/2007  . GOUT 07/26/2007  . HYPERTENSION 07/26/2007  . RENAL INSUFFICIENCY 07/26/2007    Past Medical History  Diagnosis Date  . 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  . GOUT 07/26/2007  . HYPERCHOLESTEROLEMIA, BORDERLINE 01/16/2010  . HYPERTENSION 07/26/2007  . OSTEOARTHRITIS 09/28/2007  . PLANTAR FASCIITIS, BILATERAL 04/19/2010  . PROSTATITIS, RECURRENT 11/08/2009  . RENAL INSUFFICIENCY 07/26/2007  . 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 URINARY FREQUENCY, CHRONIC 03/10/2008  . Stroke 01/21/2013    Past Surgical History  Procedure Laterality Date  . Rt arm surgery for gout      History  Substance Use Topics  . Smoking status: Never Smoker   . Smokeless tobacco: Never Used  . Alcohol Use: No     Comment: none    Family History  Problem Relation Age of Onset  .  Heart disease Mother   . Heart disease Father   . CVA Father   . Diabetes Sister     Allergies  Allergen Reactions  . Other     Strawberries Cant breath    Medication list has been reviewed and updated.  Current Outpatient Prescriptions on File Prior to Visit  Medication Sig Dispense Refill  . amLODipine (NORVASC) 10 MG tablet TAKE 1 TABLET BY MOUTH EVERY DAY  90 tablet  3  . aspirin EC 325 MG tablet Take 1 tablet (325 mg total) by mouth daily.    0  . CIALIS 5 MG tablet TAKE 1 TABLET BY MOUTH EVERY DAY  30 tablet  6  . diazepam (VALIUM) 5 MG tablet TAKE 1 TABLET BY MOUTH EVERY DAY AS NEEDED  30 tablet  0  . doxycycline (VIBRAMYCIN) 100 MG capsule Take 1 capsule (100 mg total) by mouth 2 (two) times daily.  20 capsule  0  . Febuxostat (ULORIC) 80 MG TABS Take 80 mg by mouth daily.       . furosemide (LASIX) 20 MG tablet Take 1 tablet (20 mg total) by mouth daily. PATIENT NEEDS OFFICE VISIT FOR ADDITIONAL REFILLS  30 tablet  5  . indomethacin (INDOCIN) 50 MG capsule TAKE 1 CAPSULE THREE TIMES A DAY  270 capsule  0  . irbesartan (AVAPRO) 300 MG tablet TAKE 1 TABLET BY MOUTH EVERY DAY  30 tablet  11  . irbesartan (AVAPRO) 300 MG tablet TAKE 1 TABLET BY MOUTH EVERY DAY  30 tablet  3  . labetalol (NORMODYNE) 300 MG tablet Take 1 tablet (300 mg total) by mouth 2 (two) times daily.  60 tablet  11  . lisinopril (PRINIVIL,ZESTRIL) 40 MG tablet TAKE 1 TABLET BY MOUTH EVERY DAY  90 tablet  3  . metFORMIN (GLUCOPHAGE) 500 MG tablet TAKE 1 TABLET BY MOUTH TWICE A DAY WITH A MEAL  180 tablet  3  . Multiple Vitamin (MULTIVITAMIN WITH MINERALS) TABS Take 1 tablet by mouth daily.      . simvastatin (ZOCOR) 20 MG tablet TAKE 1 TABLET BY MOUTH EVERY DAY AT 6 PM  90 tablet  3   No current facility-administered medications on file prior to visit.    Review of Systems:  As per HPI- otherwise negative.   Physical Examination: Filed Vitals:   09/30/13 1241  BP: 134/72  Pulse: 73  Temp: 98.1 F  (36.7 C)  Resp: 18   Filed Vitals:   09/30/13 1241  Height: 5' 8.75" (1.746 m)  Weight: 233 lb (105.688 kg)   Body mass index is 34.67 kg/(m^2). Ideal Body Weight: Weight in (lb) to have BMI = 25: 167.7   GEN: WDWN, NAD, Non-toxic, Alert & Oriented x 3, obese, looks well HEENT: Atraumatic, Normocephalic.  Ears and Nose: No external deformity. EXTR: No clubbing/cyanosis/edema NEURO: Normal gait.  PSYCH: Normally interactive. Conversant. Not depressed or anxious appearing.  Calm demeanor.  Right hip: removed bandage and packing from abscess over his right hip.  Expressed a scant amount of sebaceous material.  Replaced about 5 inches of 1/4 packing and re- dressed.    Assessment and Plan: Encounter for wound care  Return in 2 days for follow-up wound care. His BP checked well against ours and appears to be accurate.    Signed Lamar Blinks, MD

## 2013-09-30 NOTE — Patient Instructions (Signed)
Please come and see [place] for wound care in 2 days.

## 2013-09-30 NOTE — Progress Notes (Signed)
Dr Lorelei Pont asked me to check pt's home BP cuff with the results of repeat manual BP w/our equipment. Pt's home monitor results were 149/78 in left arm sitting. Repeat BP w/our equipment was 148/80 left arm sitting. Advised pt that his monitor appears to be very accurate.

## 2013-10-01 LAB — WOUND CULTURE
Gram Stain: NONE SEEN
Organism ID, Bacteria: NO GROWTH

## 2013-10-02 ENCOUNTER — Ambulatory Visit (INDEPENDENT_AMBULATORY_CARE_PROVIDER_SITE_OTHER): Payer: BC Managed Care – PPO | Admitting: Physician Assistant

## 2013-10-02 VITALS — BP 128/72 | HR 72 | Temp 97.9°F | Resp 18

## 2013-10-02 DIAGNOSIS — L089 Local infection of the skin and subcutaneous tissue, unspecified: Secondary | ICD-10-CM

## 2013-10-02 DIAGNOSIS — L723 Sebaceous cyst: Secondary | ICD-10-CM

## 2013-10-02 NOTE — Progress Notes (Signed)
   [address], [place] [address]   Phone [phone]  Subjective:    Patient ID: Jared [name], male    DOB: Sep 09, 1946, 67 y.o.   MRN: [id number]  HPI  Pt presents to clinic for wound recheck from an I&D on 10/8.  He is tolerating the abx ok and feels like the area is getting better.  He has not changed the drsg.   Review of Systems  Constitutional: Negative for fever and chills.  Gastrointestinal: Negative for nausea.  Skin: Positive for wound.       Objective:   Physical Exam  Vitals reviewed. Constitutional: He is oriented to person, place, and time. He appears well-developed and well-nourished.  HENT:  Head: Normocephalic and atraumatic.  Right Ear: External ear normal.  Left Ear: External ear normal.  Pulmonary/Chest: Effort normal.  Neurological: He is alert and oriented to person, place, and time.  Skin: Skin is warm and dry.  Drsg and packing removed.  No erythema surrounding wound and mild induration.  Sebaceous material and cyst wall removed from wound cavity.  Irrigated with 2% lido with epi and then repacked with 1/4in plain packing.  Drsg placed.  Psychiatric: He has a normal mood and affect. His behavior is normal. Judgment and thought content normal.   Wound culture is reviewed with patient.  Assessment & Plan:  Infected sebaceous cyst - pt to change drsg daily and RTC in 3 days for wound care.  He is to continue his abx.  Windell Hummingbird PA-C 10/02/2013 11:31 AM

## 2013-10-05 ENCOUNTER — Encounter: Payer: Self-pay | Admitting: Neurology

## 2013-10-05 ENCOUNTER — Ambulatory Visit (INDEPENDENT_AMBULATORY_CARE_PROVIDER_SITE_OTHER): Payer: BC Managed Care – PPO | Admitting: Neurology

## 2013-10-05 ENCOUNTER — Ambulatory Visit (INDEPENDENT_AMBULATORY_CARE_PROVIDER_SITE_OTHER): Payer: BC Managed Care – PPO | Admitting: Physician Assistant

## 2013-10-05 VITALS — BP 136/82 | HR 61 | Temp 98.1°F | Resp 18 | Wt 229.0 lb

## 2013-10-05 VITALS — BP 121/88 | HR 70 | Temp 98.0°F | Ht 68.0 in | Wt 232.0 lb

## 2013-10-05 DIAGNOSIS — I10 Essential (primary) hypertension: Secondary | ICD-10-CM

## 2013-10-05 DIAGNOSIS — G4733 Obstructive sleep apnea (adult) (pediatric): Secondary | ICD-10-CM

## 2013-10-05 DIAGNOSIS — I6350 Cerebral infarction due to unspecified occlusion or stenosis of unspecified cerebral artery: Secondary | ICD-10-CM

## 2013-10-05 DIAGNOSIS — Z5189 Encounter for other specified aftercare: Secondary | ICD-10-CM

## 2013-10-05 DIAGNOSIS — L723 Sebaceous cyst: Secondary | ICD-10-CM

## 2013-10-05 DIAGNOSIS — L089 Local infection of the skin and subcutaneous tissue, unspecified: Secondary | ICD-10-CM

## 2013-10-05 DIAGNOSIS — I639 Cerebral infarction, unspecified: Secondary | ICD-10-CM

## 2013-10-05 NOTE — Progress Notes (Signed)
Subjective:  [name] is 67 year old male s/p I&D on 10/8 for a sebaceous cyst. This is his third f/u for wound check. He is tolerating the antibiotic fine and has two days left on it. No nausea, constipation, or diarrhea. He feels that the area is getting better every day, decreased pain. He has not changed the bandage since the last visit on 10/12. No fever or chills  Objective:  Dressing and packing removed. Dressing has small amount of purulent gray discharge. No erythema surround wound. Small amount of sebaceous material and cyst wall removed from opening of wound. Repacked with 1/4 in plain packing. Bandage applied.  BP 136/82  Pulse 61  Temp(Src) 98.1 F (36.7 C) (Oral)  Resp 18  Wt 229 lb (103.874 kg)  BMI 34.83 kg/m2  SpO2 98%  PF 68 L/min  Assessment: Infected sebaceous cyst  Encounter for wound care  Plan: Patient Instructions  Complete the antibiotic. Apply a warm compress to the area for 15-20 minutes 2-4 times each day. Replace the dressing if it becomes saturated, leaks, gets wet or falls off.  Recheck wound in 2-3 days

## 2013-10-05 NOTE — Patient Instructions (Signed)
We will bring you back for full night BiPAP titration to optimize your treatment, you have severe sleep apnea, which is not optimally treated.  Please continue using your BiPAP regularly. While your insurance requires that you use it at least 4 hours each night on 70% of the nights, I recommend, that you not skip any nights and use it throughout the night if you can. Getting used to BiPAP does take time and patience and discipline. Untreated obstructive sleep apnea when it is moderate to severe can have an adverse impact on cardiovascular health and raise her risk for heart disease, arrhythmias, hypertension, congestive heart failure, stroke and diabetes. Untreated obstructive sleep apnea causes sleep disruption, nonrestorative sleep, and sleep deprivation. This can have an impact on your day to day functioning and cause daytime sleepiness and impairment of cognitive function, memory loss, mood disturbance, and problems focussing. Using BiPAP regularly can improve these symptoms.

## 2013-10-05 NOTE — Progress Notes (Signed)
I have examined this patient along with the student and agree.  He saw neurology this morning and is scheduled for a sleep study.  He has not yet heard about his referral to cardiology, and I will follow-up on that.  If he hasn't heard by his follow-up with [place] on 10/17 or 10/18, he'll let us know.

## 2013-10-05 NOTE — Progress Notes (Signed)
Subjective:    Patient ID: Jared [name] is a 67 y.o. male.  HPI  Star Age, MD, PhD [facility] [address] P.O. [place], [place] [address]  Dear Dr. Tomma Rakers,    I saw your patient, Jared [name], upon your kind request in my neurologic clinic today for initial consultation of his obstructive sleep apnea. The patient is unaccompanied today. As you know, Jared [name] is a very pleasant 67 year old right-handed gentleman with an underlying medical history of HTN, stroke in January 2014 (at which time he was changed from baby aspirin to adult size ASA), DM, HLP, insomnia, who was recently diagnosed with obstructive sleep apnea. He had a split-night sleep study at [facility] which I reviewed from 03/08/2013: His total AHI was 82.2 per hour. His O2 nadir was 77%. His sleep efficiency was 88.3%. He had an increased percentage of stage II sleep, absence of deep sleep and REM sleep. He was titrated on CPAP, but had ongoing sleep disordered breathing. His residual AHI ranged from 14-85 per hour. He was titrated on CPAP from 4 cm to 14 cm and then switched to BiPAP on which he did not do well. He was titrated on 15/12-19/16 cm and his  with AHI ranged from 60-85 per hour. He is currently on auto bipap of up to 19/16 cm, but c/o residual EDS, non-restorative sleep, difficulty tolerating the pressures and often it feels like there is too much pressure. He wonders also if the machine is defective, his DME company is [facility]. There is no compliance data available.   His typical bedtime is reported to be around 8 PM, but he usually watches TV for some time. His usual wake time is around 8 AM. Sleep onset typically occurs within 1-2 hours. He reports feeling marginally rested upon awakening. He wakes up on an average few times in the middle of the night.  He reports excessive daytime somnolence (EDS) and His Epworth Sleepiness Score (ESS) is 12/24 today. He has not fallen  asleep while driving. The patient has not been taking a planned nap, but falls asleep during the day.   He has been known to snore for the past many years. Snoring is reportedly moderate, and associated with choking sounds and witnessed apneas. The patient admits to a sense of choking or strangling feeling. The patient has not noted any RLS symptoms and is not known to kick while asleep or before falling asleep. There is no family history of RLS or OSA.    He denies cataplexy, sleep paralysis, hypnagogic or hypnopompic hallucinations, or sleep attacks. He does not report any vivid dreams, nightmares, dream enactments, or parasomnias, such as sleep talking or sleep walking.   His Past Medical History Is Significant For: Past Medical History  Diagnosis Date  . 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  . GOUT 07/26/2007  . HYPERCHOLESTEROLEMIA, BORDERLINE 01/16/2010  . HYPERTENSION 07/26/2007  . OSTEOARTHRITIS 09/28/2007  . PLANTAR FASCIITIS, BILATERAL 04/19/2010  . PROSTATITIS, RECURRENT 11/08/2009  . RENAL INSUFFICIENCY 07/26/2007  . 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 URINARY FREQUENCY, CHRONIC 03/10/2008  . Stroke 01/21/2013    His Past Surgical History Is Significant For: Past Surgical History  Procedure Laterality Date  . Rt arm surgery for gout      His Family History Is Significant For: Family History  Problem Relation Age of Onset  . Heart disease Mother   . Heart disease Father   . CVA Father   .  Diabetes Sister     His Social History Is Significant For: History   Social History  . Marital Status: Married    Spouse Name: N/A    Number of Children: N/A  . Years of Education: N/A   Social History Main Topics  . Smoking status: Never Smoker   . Smokeless tobacco: Never Used  . Alcohol Use: No     Comment: none  . Drug Use: No  . Sexual Activity: No   Other Topics Concern  . None   Social History Narrative  . None    His Allergies Are:   Allergies  Allergen Reactions  . Other     Strawberries Cant breath  :   His Current Medications Are:  Outpatient Encounter Prescriptions as of 10/05/2013  Medication Sig Dispense Refill  . amLODipine (NORVASC) 10 MG tablet TAKE 1 TABLET BY MOUTH EVERY DAY  90 tablet  3  . aspirin EC 325 MG tablet Take 1 tablet (325 mg total) by mouth daily.    0  . CIALIS 5 MG tablet TAKE 1 TABLET BY MOUTH EVERY DAY  30 tablet  6  . diazepam (VALIUM) 5 MG tablet TAKE 1 TABLET BY MOUTH EVERY DAY AS NEEDED  30 tablet  0  . doxycycline (VIBRAMYCIN) 100 MG capsule Take 1 capsule (100 mg total) by mouth 2 (two) times daily.  20 capsule  0  . Febuxostat (ULORIC) 80 MG TABS Take 80 mg by mouth daily.       . furosemide (LASIX) 20 MG tablet Take 1 tablet (20 mg total) by mouth daily. PATIENT NEEDS OFFICE VISIT FOR ADDITIONAL REFILLS  30 tablet  5  . indomethacin (INDOCIN) 50 MG capsule TAKE 1 CAPSULE THREE TIMES A DAY  270 capsule  0  . irbesartan (AVAPRO) 300 MG tablet TAKE 1 TABLET BY MOUTH EVERY DAY  30 tablet  11  . labetalol (NORMODYNE) 300 MG tablet Take 1 tablet (300 mg total) by mouth 2 (two) times daily.  60 tablet  11  . lisinopril (PRINIVIL,ZESTRIL) 40 MG tablet TAKE 1 TABLET BY MOUTH EVERY DAY  90 tablet  3  . metFORMIN (GLUCOPHAGE) 500 MG tablet TAKE 1 TABLET BY MOUTH TWICE A DAY WITH A MEAL  180 tablet  3  . simvastatin (ZOCOR) 20 MG tablet TAKE 1 TABLET BY MOUTH EVERY DAY AT 6 PM  90 tablet  3  . [DISCONTINUED] Multiple Vitamin (MULTIVITAMIN WITH MINERALS) TABS Take 1 tablet by mouth daily.       No facility-administered encounter medications on file as of 10/05/2013.   Review of Systems:  Out of a complete 14 point review of systems, all are reviewed and negative with the exception of these symptoms as listed below:  Review of Systems  HENT:       Spinning sensation   Endocrine: Positive for heat intolerance.       Increased thirst  Neurological: Positive for weakness.   Psychiatric/Behavioral:       Decreased Energy     Objective:  Neurologic Exam  Physical Exam Physical Examination:   Filed Vitals:   10/05/13 0858  BP: 121/88  Pulse: 70  Temp: 98 F (36.7 C)    General Examination: The patient is a very pleasant 67 y.o. male in no acute distress. He appears well-developed and well-nourished and well groomed. He is obese.  HEENT: Normocephalic, atraumatic, pupils are equal, round and reactive to light and accommodation. Funduscopic exam is normal with sharp disc  margins noted. Extraocular tracking is good without limitation to gaze excursion or nystagmus noted. Normal smooth pursuit is noted. Hearing is grossly intact. Tympanic membranes are clear bilaterally. Face is symmetric with normal facial animation and normal facial sensation. Speech is clear with no dysarthria noted. There is no hypophonia. There is no lip, neck/head, jaw or voice tremor. Neck is supple with full range of passive and active motion. There are no carotid bruits on auscultation. Oropharynx exam reveals: mild mouth dryness, adequate dental hygiene and marked airway crowding, due to thick soft palate, large uvula, large tongue. Mallampati is class III. Tongue protrudes centrally and palate elevates symmetrically. size/absent. Neck size is large.    Chest: Clear to auscultation without wheezing, rhonchi or crackles noted.  Heart: S1+S2+0, regular and normal without murmurs, rubs or gallops noted.   Abdomen: Soft, non-tender and non-distended with normal bowel sounds appreciated on auscultation.  Extremities: There is no pitting edema in the distal lower extremities bilaterally. Pedal pulses are intact.  Skin: Warm and dry without trophic changes noted. There are no varicose veins.  Musculoskeletal: exam reveals no obvious joint deformities, tenderness or joint swelling or erythema.   Neurologically:  Mental status: The patient is awake, alert and oriented in all 4 spheres.  His memory, attention, language and knowledge are appropriate. There is no aphasia, agnosia, apraxia or anomia. Speech is clear with normal prosody and enunciation. Thought process is linear. Mood is congruent and affect is normal.  Cranial nerves are as described above under HEENT exam. In addition, shoulder shrug is normal with equal shoulder height noted. Motor exam: Normal bulk, strength and tone is noted. There is no drift, tremor or rebound. Romberg is negative. Reflexes are 2+ throughout. Toes are downgoing bilaterally. Fine motor skills are intact with normal finger taps, normal hand movements, normal rapid alternating patting, normal foot taps and normal foot agility, with the exception of mild difficulty with foot taps on the L.  Cerebellar testing shows no dysmetria or intention tremor on finger to nose testing. Heel to shin is unremarkable bilaterally. There is no truncal or gait ataxia.  Sensory exam is intact to light touch, pinprick, vibration, temperature sense in the upper and lower extremities.  Gait, station and balance are unremarkable. No veering to one side is noted. No leaning to one side is noted. Posture is age-appropriate and stance is narrow based. No problems turning are noted. He turns en bloc. Tandem walk is difficult for him. Intact toe and heel stance is noted.               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 He had an unsuccessful titration portion of the split study and was empirically place in auto-BiPAP. While he is compliant with treatment, he is struggling with residual excessive daytime somnolence, nonrestorative sleep, poor sleep consolidation and difficulty with tolerance of the  pressures. He's using a large nasal pillows mask which at these high pressures may not be the appropriate mask for him. Unfortunately I did not have any compliance data to review from his machine even though he had an SD card in the machine which he brought for review today. I can see that last  night he used it for about 4 hours. We also contacted [facility], but it looks like they did not have any records on him. We will try to get to the bottom of this and I have also encouraged him to go by the DME office today to trouble shoot his issues with the machine as he feels that sometimes the machine does not work properly. At this juncture, I have explained to him that in order to properly treat his severe obstructive sleep apnea for which did not respond appropriately to CPAP we would have to bring him back for a full night titration study with BiPAP. In the interim, I have asked him to continue using his current machine and mask. Based on the test results with his full night titration test I will hopefully be able to prescribe him his pressure setting and new mask. He was in agreement. To that end, I have ordered a full night BiPAP titration test and we will see him shortly afterwards.  Thank you very much for allowing me to participate in the care of this nice patient. If I can be of any further assistance to you please do not hesitate to call me at [phone].  Sincerely,   Star Age, MD, PhD

## 2013-10-05 NOTE — Patient Instructions (Signed)
Complete the antibiotic. Apply a warm compress to the area for 15-20 minutes 2-4 times each day. Replace the dressing if it becomes saturated, leaks, gets wet or falls off.

## 2013-10-07 ENCOUNTER — Ambulatory Visit (INDEPENDENT_AMBULATORY_CARE_PROVIDER_SITE_OTHER): Payer: BC Managed Care – PPO | Admitting: Physician Assistant

## 2013-10-07 ENCOUNTER — Encounter: Payer: Self-pay | Admitting: Neurology

## 2013-10-07 ENCOUNTER — Encounter: Payer: Self-pay | Admitting: Physician Assistant

## 2013-10-07 VITALS — BP 124/80 | HR 86 | Temp 99.1°F | Resp 18 | Ht 67.5 in | Wt 227.8 lb

## 2013-10-07 DIAGNOSIS — L723 Sebaceous cyst: Secondary | ICD-10-CM

## 2013-10-07 DIAGNOSIS — L089 Local infection of the skin and subcutaneous tissue, unspecified: Secondary | ICD-10-CM

## 2013-10-07 NOTE — Progress Notes (Signed)
Patient ID: Jared [name] MRN: [id number], DOB: 10/04/1946 67 y.o. Date of Encounter: 10/07/2013, 2:58 PM  Primary Physician: Georgetta Haber, MD  Chief Complaint: Wound care   See previous note  HPI: 67 y.o. male presents for wound care s/p I&D on 09/28/13 Doing well No issues or complaints Afebrile/ no chills No nausea or vomiting Tolerating doxycycline Pain resolved Daily dressing change Previous notes reviewed  Still waiting for cardiology referral. Will have off and on dizziness lasting several seconds then self resolving. Most recent episode occuring around 4:30 PM the previous day. Patient states "I may not be eating often enough." States he eats breakfast and then dinner. No chest pain, chest tightness, palpitations, nausea, vomiting, diaphoresis, SOB, or wheezing.   Past Medical History  Diagnosis Date  . 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  . GOUT 07/26/2007  . HYPERCHOLESTEROLEMIA, BORDERLINE 01/16/2010  . HYPERTENSION 07/26/2007  . OSTEOARTHRITIS 09/28/2007  . PLANTAR FASCIITIS, BILATERAL 04/19/2010  . PROSTATITIS, RECURRENT 11/08/2009  . RENAL INSUFFICIENCY 07/26/2007  . 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 URINARY FREQUENCY, CHRONIC 03/10/2008  . Stroke 01/21/2013     Home Meds: Prior to Admission medications   Medication Sig Start Date End Date Taking? Authorizing Provider  amLODipine (NORVASC) 10 MG tablet TAKE 1 TABLET BY MOUTH EVERY DAY 05/25/13  Yes Ricard Dillon, MD  aspirin EC 325 MG tablet Take 1 tablet (325 mg total) by mouth daily. 01/23/13  Yes Barton Dubois, MD  diazepam (VALIUM) 5 MG tablet TAKE 1 TABLET BY MOUTH EVERY DAY AS NEEDED 08/25/13  Yes Ricard Dillon, MD  doxycycline (VIBRAMYCIN) 100 MG capsule Take 1 capsule (100 mg total) by mouth 2 (two) times daily. 09/28/13  Yes Christyn Gutkowski M Hubert Derstine, PA-C  Febuxostat (ULORIC) 80 MG TABS Take 80 mg by mouth daily.    Yes Historical Provider, MD  furosemide (LASIX) 20 MG tablet Take 1  tablet (20 mg total) by mouth daily. PATIENT NEEDS OFFICE VISIT FOR ADDITIONAL REFILLS 08/02/13  Yes Timoteo Gaul, FNP  indomethacin (INDOCIN) 50 MG capsule TAKE 1 CAPSULE THREE TIMES A DAY 06/23/13  Yes Ricard Dillon, MD  irbesartan (AVAPRO) 300 MG tablet TAKE 1 TABLET BY MOUTH EVERY DAY 07/20/13  Yes Ricard Dillon, MD  labetalol (NORMODYNE) 300 MG tablet Take 1 tablet (300 mg total) by mouth 2 (two) times daily. 04/12/13  Yes Ricard Dillon, MD  lisinopril (PRINIVIL,ZESTRIL) 40 MG tablet TAKE 1 TABLET BY MOUTH EVERY DAY 05/25/13  Yes Ricard Dillon, MD  metFORMIN (GLUCOPHAGE) 500 MG tablet TAKE 1 TABLET BY MOUTH TWICE A DAY WITH A MEAL 08/09/13  Yes Ricard Dillon, MD  simvastatin (ZOCOR) 20 MG tablet TAKE 1 TABLET BY MOUTH EVERY DAY AT 6 PM 05/25/13  Yes Ricard Dillon, MD  CIALIS 5 MG tablet TAKE 1 TABLET BY MOUTH EVERY DAY 06/23/13   Ricard Dillon, MD    Allergies:  Allergies  Allergen Reactions  . Other     Strawberries Cant breath    ROS: Constitutional: Afebrile, no chills Cardiovascular: negative for chest pain or palpitations Dermatological: Positive for wound. Negative for erythema, pain, or warmth  GI: No nausea or vomiting   EXAM: Physical Exam: Blood pressure 124/80, pulse 86, temperature 99.1 F (37.3 C), temperature source Oral, resp. rate 18, height 5' 7.5" (1.715 m), weight 227 lb 12.8 oz (103.329 kg), SpO2 99.00%., Body mass index is 35.13 kg/(m^2). General: Well developed, well nourished, in no  acute distress. Nontoxic appearing. Head: Normocephalic, atraumatic, sclera non-icteric.  Neck: Supple. Lungs: Breathing is unlabored. Heart: Normal rate. Skin:  Warm and moist. Dressing and packing in place. No induration, erythema, or tenderness to palpation. Neuro: Alert and oriented X 3. Moves all extremities spontaneously. Normal gait.  Psych:  Responds to questions appropriately with a normal affect.   PROCEDURE: Dressing and packing removed No purulence  expressed No sebaceous material expressed Wound bed healthy Irrigated with 1% plain lidocaine 5 cc. Repacked with 1/4 inch plain packing Dressing applied  LAB: Culture: No growth 2 days  A/P: 67 y.o. male with improving cellulitis/abscess as above s/p I&D on 09/28/13 and history of dizziness, uncontrolled hypertension, and history of stroke 1) Infected sebaceous cyst status post I&D -Wound care per above -Continue doxycycline -Pain well controlled -Daily dressing changes -Recheck 72 hours  2) History of dizziness/uncontrolled HTN/history of stroke -Await cardiology referral for risk stratification  -Optimal blood pressure -Healthy diet -Eat regular, small meals -Consider neurology referral  Signed, Christell Faith, PA-C [facility], [place] [address] [phone] 10/07/2013 2:58 PM

## 2013-10-07 NOTE — Progress Notes (Signed)
Quick Note:  I reviewed the patient's BiPAP compliance data from 04/09/2013 to 05/08/2013, which is a total of 30 days, during which time the patient used BiPAP every day except for 1 day. The average usage for all days was 7 hours and 55 minutes. The percent used days greater than 4 hours was 93 %, indicating excellent compliance. The residual AHI was 0.7 per hour, indicating a good treatment pressure of maximum IPAP of 16 and a minimum EPAP of 8 cwp, however, when I reviewed his compliance data from more recent months which is 07/07/2013 through 10/04/2013 which is a total of 90 days, he has only used it 70 days and percent used days greater than 4 hours drastically reduced to 31%, his average usage was 2 hours and 50 minutes. His residual AHI continues to be fairly good at 0.8 per hour. However in the interim, his machine has been found to be defective, his card reader is not working and he has already been scheduled for a full night BiPAP re\re titration study for optimization of his treatment pressure and hopefully determining a pressure that he can be consistently on. I will review this data with the patient at the next visit, provide feedback and additional trouble shooting if need be.  Star Age, MD, PhD [facility]    ______

## 2013-10-09 ENCOUNTER — Other Ambulatory Visit: Payer: Self-pay | Admitting: Physician Assistant

## 2013-10-10 ENCOUNTER — Ambulatory Visit (INDEPENDENT_AMBULATORY_CARE_PROVIDER_SITE_OTHER): Payer: BC Managed Care – PPO | Admitting: Physician Assistant

## 2013-10-10 VITALS — BP 118/78 | HR 71 | Temp 98.0°F | Resp 16

## 2013-10-10 DIAGNOSIS — L723 Sebaceous cyst: Secondary | ICD-10-CM

## 2013-10-10 DIAGNOSIS — L089 Local infection of the skin and subcutaneous tissue, unspecified: Secondary | ICD-10-CM

## 2013-10-10 DIAGNOSIS — Z5189 Encounter for other specified aftercare: Secondary | ICD-10-CM

## 2013-10-10 NOTE — Progress Notes (Signed)
This 67 y.o. male presents for evaluation of a wound on the RIGHT hip. S/p I&D of an infected sebaceous cyst on 09/28/2013.  He continues to improve.  Completed Doxycycline without difficulty.  O: BP 118/78  Pulse 71  Temp(Src) 98 F (36.7 C) (Oral)  Resp 16  SpO2 98% WDWNBM, A&0 x 3.  Dressing and packing removed.  No additional purulence expressed.  Additional sebaceous sac material removed with pickups.  Gently repacked with 1/4 inch plain packing and dressed.  A: Infected sebaceous cyst  Encounter for wound care   P: RTC 48 hours for wound care. Continue warm compresses and PRN dressing changes.

## 2013-10-10 NOTE — Patient Instructions (Signed)
Continue applying the warm compress to the site for 15-20 minutes 2-4 times each day. Change the dressing if it becomes saturated, leaks or falls off.

## 2013-10-12 ENCOUNTER — Ambulatory Visit (INDEPENDENT_AMBULATORY_CARE_PROVIDER_SITE_OTHER): Payer: BC Managed Care – PPO | Admitting: Physician Assistant

## 2013-10-12 ENCOUNTER — Encounter: Payer: Self-pay | Admitting: Cardiovascular Disease

## 2013-10-12 ENCOUNTER — Ambulatory Visit (INDEPENDENT_AMBULATORY_CARE_PROVIDER_SITE_OTHER): Payer: BC Managed Care – PPO | Admitting: Cardiovascular Disease

## 2013-10-12 VITALS — BP 148/98 | HR 62 | Ht 68.0 in | Wt 232.6 lb

## 2013-10-12 VITALS — BP 130/78 | HR 74 | Temp 98.3°F | Resp 18 | Wt 231.0 lb

## 2013-10-12 DIAGNOSIS — N259 Disorder resulting from impaired renal tubular function, unspecified: Secondary | ICD-10-CM

## 2013-10-12 DIAGNOSIS — I119 Hypertensive heart disease without heart failure: Secondary | ICD-10-CM

## 2013-10-12 DIAGNOSIS — I639 Cerebral infarction, unspecified: Secondary | ICD-10-CM

## 2013-10-12 DIAGNOSIS — L723 Sebaceous cyst: Secondary | ICD-10-CM

## 2013-10-12 DIAGNOSIS — I6350 Cerebral infarction due to unspecified occlusion or stenosis of unspecified cerebral artery: Secondary | ICD-10-CM

## 2013-10-12 DIAGNOSIS — E119 Type 2 diabetes mellitus without complications: Secondary | ICD-10-CM

## 2013-10-12 DIAGNOSIS — L089 Local infection of the skin and subcutaneous tissue, unspecified: Secondary | ICD-10-CM

## 2013-10-12 DIAGNOSIS — E785 Hyperlipidemia, unspecified: Secondary | ICD-10-CM

## 2013-10-12 DIAGNOSIS — I10 Essential (primary) hypertension: Secondary | ICD-10-CM

## 2013-10-12 DIAGNOSIS — Z5189 Encounter for other specified aftercare: Secondary | ICD-10-CM

## 2013-10-12 MED ORDER — DOXAZOSIN MESYLATE 1 MG PO TABS: 1.0000 mg | ORAL_TABLET | Freq: Every day | ORAL | Status: DC

## 2013-10-12 NOTE — Patient Instructions (Signed)
Your physician has requested that you have a lexiscan myoview. For further information please visit [email or web address]. Please follow instruction sheet, as given.   Your physician recommends that you return for lab work fasting.  Your physician recommends that you schedule a follow-up appointment in: 4 WEEKS.

## 2013-10-12 NOTE — Progress Notes (Signed)
Patient ID: Jared [name] MRN: [id number], DOB: 1946/02/11 67 y.o. Date of Encounter: 10/12/2013, 10:56 AM  Primary Physician: Georgetta Haber, MD  Chief Complaint: Wound care   See previous note  HPI: 67 y.o. male presents for wound care s/p I&D on 09/28/13 Doing well No issues or complaints Afebrile/ no chills No nausea or vomiting Tolerating doxycycline Pain resolved Daily dressing change Previous note reviewed  Past Medical History  Diagnosis Date  . 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  . GOUT 07/26/2007  . HYPERCHOLESTEROLEMIA, BORDERLINE 01/16/2010  . HYPERTENSION 07/26/2007  . OSTEOARTHRITIS 09/28/2007  . PLANTAR FASCIITIS, BILATERAL 04/19/2010  . PROSTATITIS, RECURRENT 11/08/2009  . RENAL INSUFFICIENCY 07/26/2007  . 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 URINARY FREQUENCY, CHRONIC 03/10/2008  . Stroke 01/21/2013     Home Meds: Prior to Admission medications   Medication Sig Start Date End Date Taking? Authorizing Provider  amLODipine (NORVASC) 10 MG tablet TAKE 1 TABLET BY MOUTH EVERY DAY 05/25/13  Yes Ricard Dillon, MD  aspirin EC 325 MG tablet Take 1 tablet (325 mg total) by mouth daily. 01/23/13  Yes Barton Dubois, MD  CIALIS 5 MG tablet TAKE 1 TABLET BY MOUTH EVERY DAY 06/23/13  Yes Ricard Dillon, MD  diazepam (VALIUM) 5 MG tablet TAKE 1 TABLET BY MOUTH EVERY DAY AS NEEDED 08/25/13  Yes Ricard Dillon, MD  doxycycline (VIBRAMYCIN) 100 MG capsule Take 1 capsule (100 mg total) by mouth 2 (two) times daily. 09/28/13  Yes Iana Buzan M Bo Teicher, PA-C  Febuxostat (ULORIC) 80 MG TABS Take 80 mg by mouth daily.    Yes Historical Provider, MD  furosemide (LASIX) 20 MG tablet Take 1 tablet (20 mg total) by mouth daily. PATIENT NEEDS OFFICE VISIT FOR ADDITIONAL REFILLS 08/02/13  Yes Timoteo Gaul, FNP  indomethacin (INDOCIN) 50 MG capsule TAKE 1 CAPSULE THREE TIMES A DAY 06/23/13  Yes Ricard Dillon, MD  irbesartan (AVAPRO) 300 MG tablet TAKE 1 TABLET BY  MOUTH EVERY DAY 07/20/13  Yes Ricard Dillon, MD  labetalol (NORMODYNE) 300 MG tablet Take 1 tablet (300 mg total) by mouth 2 (two) times daily. 04/12/13  Yes Ricard Dillon, MD  lisinopril (PRINIVIL,ZESTRIL) 40 MG tablet TAKE 1 TABLET BY MOUTH EVERY DAY 05/25/13  Yes Ricard Dillon, MD  metFORMIN (GLUCOPHAGE) 500 MG tablet TAKE 1 TABLET BY MOUTH TWICE A DAY WITH A MEAL 08/09/13  Yes Ricard Dillon, MD  simvastatin (ZOCOR) 20 MG tablet TAKE 1 TABLET BY MOUTH EVERY DAY AT 6 PM 05/25/13  Yes Ricard Dillon, MD    Allergies:  Allergies  Allergen Reactions  . Other     Strawberries Cant breath    ROS: Constitutional: Afebrile, no chills Cardiovascular: negative for chest pain or palpitations Dermatological: Positive for wound. Negative for erythema, pain, or warmth  GI: No nausea or vomiting   EXAM: Physical Exam: Blood pressure 130/78, pulse 74, temperature 98.3 F (36.8 C), temperature source Oral, resp. rate 18, weight 231 lb (104.781 kg), SpO2 99.00%., Body mass index is 35.62 kg/(m^2). General: Well developed, well nourished, in no acute distress. Nontoxic appearing. Head: Normocephalic, atraumatic, sclera non-icteric.  Neck: Supple. Lungs: Breathing is unlabored. Heart: Normal rate. Skin:  Warm and moist. Dressing and packing in place. No induration, erythema, or tenderness to palpation. Neuro: Alert and oriented X 3. Moves all extremities spontaneously. Normal gait.  Psych:  Responds to questions appropriately with a normal affect.   PROCEDURE: Dressing and  packing removed. No purulence expressed Wound bed healthy Irrigated with 1% plain lidocaine 5 cc. Repacked with small amount of plain packing Dressing applied  LAB: Culture: no growth 2 days  A/P: 67 y.o. male with resolving cellulitis/abscess as above s/p I&D on 09/28/13 -Wound care per above -Continue doxycycline -Pain well controlled -Daily dressing changes -Recheck 48 hours  Signed, Christell Faith, PA-C [facility] [place], [place] [address] [phone] 10/12/2013 10:56 AM

## 2013-10-14 ENCOUNTER — Ambulatory Visit (INDEPENDENT_AMBULATORY_CARE_PROVIDER_SITE_OTHER): Payer: BC Managed Care – PPO | Admitting: Physician Assistant

## 2013-10-14 VITALS — BP 122/76 | HR 82 | Temp 98.0°F | Resp 16 | Ht 68.5 in | Wt 230.0 lb

## 2013-10-14 DIAGNOSIS — L723 Sebaceous cyst: Secondary | ICD-10-CM

## 2013-10-14 DIAGNOSIS — L089 Local infection of the skin and subcutaneous tissue, unspecified: Secondary | ICD-10-CM

## 2013-10-14 NOTE — Progress Notes (Signed)
  Subjective:    Patient ID: Jared [name], male    DOB: 25-Dec-1945, 67 y.o.   MRN: [id number]  Wound Check     [name] is a very pleasant 67 yr old male here for follow up on an infected sebaceous cyst s/p I&D 09/28/13.  Pt reports he is doing well.  The area continues to improve.  There continues to be drainage on the bandage.  He denies pain or tenderness.  Otherwise feels well today.    Review of Systems  Constitutional: Negative.   HENT: Negative.   Respiratory: Negative.   Cardiovascular: Negative.   Gastrointestinal: Negative.   Musculoskeletal: Negative.   Neurological: Negative.        Objective:   Physical Exam  Vitals reviewed. Constitutional: He is oriented to person, place, and time. He appears well-developed and well-nourished. No distress.  HENT:  Head: Normocephalic and atraumatic.  Eyes: Conjunctivae are normal. No scleral icterus.  Pulmonary/Chest: Effort normal.  Neurological: He is alert and oriented to person, place, and time.  Skin: Skin is warm and dry.     Healing wound at right hip; no induration, erythema or warmth; unable to express and drainage from the wound though there is some bloody drainage on the bandage; wound bed visible and healthy  Psychiatric: He has a normal mood and affect. His behavior is normal.        Assessment & Plan:  Infected sebaceous cyst   [name] is a very pleasant 67 yr old male here for f/u s/p I&D of infected sebaceous cyst on 09/28/13.  Pt reports he is doing well.  Wound appears to be healing well.  Wound bed visible.  I have not repacked the area today.  Continue daily dressing changes until completely healed.  RTC if concerns

## 2013-10-17 LAB — COMPREHENSIVE METABOLIC PANEL
AST: 18 U/L (ref 0–37)
Albumin: 4.5 g/dL (ref 3.5–5.2)
Alkaline Phosphatase: 88 U/L (ref 39–117)
BUN: 15 mg/dL (ref 6–23)
CO2: 26 mEq/L (ref 19–32)
Creat: 1.48 mg/dL — ABNORMAL HIGH (ref 0.50–1.35)
Glucose, Bld: 117 mg/dL — ABNORMAL HIGH (ref 70–99)
Sodium: 141 mEq/L (ref 135–145)
Total Protein: 7.4 g/dL (ref 6.0–8.3)

## 2013-10-17 LAB — CBC
MCHC: 35.3 g/dL (ref 30.0–36.0)
MCV: 81.8 fL (ref 78.0–100.0)
Platelets: 268 10*3/uL (ref 150–400)
RBC: 4.57 MIL/uL (ref 4.22–5.81)
RDW: 14.5 % (ref 11.5–15.5)
WBC: 5.1 10*3/uL (ref 4.0–10.5)

## 2013-10-18 ENCOUNTER — Ambulatory Visit (INDEPENDENT_AMBULATORY_CARE_PROVIDER_SITE_OTHER): Payer: BC Managed Care – PPO

## 2013-10-18 DIAGNOSIS — G4761 Periodic limb movement disorder: Secondary | ICD-10-CM

## 2013-10-18 DIAGNOSIS — I10 Essential (primary) hypertension: Secondary | ICD-10-CM

## 2013-10-18 DIAGNOSIS — G4710 Hypersomnia, unspecified: Secondary | ICD-10-CM

## 2013-10-18 DIAGNOSIS — G479 Sleep disorder, unspecified: Secondary | ICD-10-CM

## 2013-10-18 DIAGNOSIS — I639 Cerebral infarction, unspecified: Secondary | ICD-10-CM

## 2013-10-18 DIAGNOSIS — G4733 Obstructive sleep apnea (adult) (pediatric): Secondary | ICD-10-CM

## 2013-10-18 LAB — NMR LIPOPROFILE WITH LIPIDS
HDL Particle Number: 25.2 umol/L — ABNORMAL LOW (ref >=30.5)
HDL Size: 8.4 nm — ABNORMAL LOW (ref >=9.2)
HDL-C: 34 mg/dL — ABNORMAL LOW (ref >=40)
LDL (calc): 61 mg/dL (ref <100)
LDL Particle Number: 1059 nmol/L — ABNORMAL HIGH (ref <1000)
LDL Size: 20 nm — ABNORMAL LOW (ref >20.5)
LP-IR Score: 73 — ABNORMAL HIGH (ref <=45)
Large HDL-P: <1.3 umol/L — ABNORMAL LOW (ref >=4.8)
Large VLDL-P: 3.5 nmol/L — ABNORMAL HIGH (ref <=2.7)
Triglycerides: 128 mg/dL (ref <150)

## 2013-10-19 ENCOUNTER — Ambulatory Visit (HOSPITAL_COMMUNITY)
Admission: RE | Admit: 2013-10-19 | Discharge: 2013-10-19 | Disposition: A | Discharge status: Home / Self Care | Payer: BC Managed Care – PPO | Source: Ambulatory Visit | Attending: Cardiovascular Disease | Admitting: Cardiovascular Disease

## 2013-10-19 DIAGNOSIS — E119 Type 2 diabetes mellitus without complications: Secondary | ICD-10-CM | POA: Insufficient documentation

## 2013-10-19 DIAGNOSIS — Z8249 Family history of ischemic heart disease and other diseases of the circulatory system: Secondary | ICD-10-CM | POA: Insufficient documentation

## 2013-10-19 DIAGNOSIS — Z8673 Personal history of transient ischemic attack (TIA), and cerebral infarction without residual deficits: Secondary | ICD-10-CM | POA: Insufficient documentation

## 2013-10-19 DIAGNOSIS — E663 Overweight: Secondary | ICD-10-CM | POA: Insufficient documentation

## 2013-10-19 DIAGNOSIS — R9431 Abnormal electrocardiogram [ECG] [EKG]: Secondary | ICD-10-CM | POA: Insufficient documentation

## 2013-10-19 DIAGNOSIS — I6350 Cerebral infarction due to unspecified occlusion or stenosis of unspecified cerebral artery: Secondary | ICD-10-CM

## 2013-10-19 DIAGNOSIS — R5381 Other malaise: Secondary | ICD-10-CM | POA: Insufficient documentation

## 2013-10-19 DIAGNOSIS — I119 Hypertensive heart disease without heart failure: Secondary | ICD-10-CM

## 2013-10-19 DIAGNOSIS — E785 Hyperlipidemia, unspecified: Secondary | ICD-10-CM

## 2013-10-19 DIAGNOSIS — I639 Cerebral infarction, unspecified: Secondary | ICD-10-CM

## 2013-10-19 DIAGNOSIS — R42 Dizziness and giddiness: Secondary | ICD-10-CM | POA: Insufficient documentation

## 2013-10-19 MED ORDER — REGADENOSON 0.4 MG/5ML IV SOLN
0.4000 mg | Freq: Once | INTRAVENOUS | Status: AC
  Administered 2013-10-19: 0.4 mg via INTRAVENOUS

## 2013-10-19 MED ORDER — TECHNETIUM TC 99M SESTAMIBI GENERIC - CARDIOLITE
30.3000 | Freq: Once | INTRAVENOUS | Status: AC | PRN
  Administered 2013-10-19: 30.3 via INTRAVENOUS

## 2013-10-19 MED ORDER — TECHNETIUM TC 99M SESTAMIBI GENERIC - CARDIOLITE
10.4000 | Freq: Once | INTRAVENOUS | Status: AC | PRN
  Administered 2013-10-19: 10 via INTRAVENOUS

## 2013-10-19 NOTE — Procedures (Addendum)
[address] [place] [address] [phone]  Cardiology Nuclear Med Study  Jared [name] is a 67 y.o. male     MRN : [id number]     DOB: 09-Dec-1946  Procedure Date: 10/19/2013  Nuclear Med Background Indication for Stress Test:  Evaluation for Ischemia and Abnormal EKG History:  Asthma Cardiac Risk Factors: CVA, Family History - CAD, Hypertension, Lipids, NIDDM, Overweight and Benign hypertensive heart disease without failure  Symptoms:  Dizziness, Fatigue and Light-Headedness   Nuclear Pre-Procedure Caffeine/Decaff Intake:  1:00am NPO After: 11am   IV Site: R Hand  IV 0.9% NS with Angio Cath:  22g  Chest Size (in):  42"  IV Started by: [name], RN  Height: 5\' 8"  (1.727 m)  Cup Size: n/a  BMI:  Body mass index is 35.28 kg/(m^2). Weight:  232 lb (105.235 kg)   Tech Comments:  n/a    Nuclear Med Study 1 or 2 day study: 1 day  Stress Test Type:  [place] Provider:  Shelva Majestic, MD   Resting Radionuclide: Technetium 3m Sestamibi  Resting Radionuclide Dose: 10.4 mCi   Stress Radionuclide:  Technetium 4m Sestamibi  Stress Radionuclide Dose: 30.3 mCi           Stress Protocol Rest HR: 62 Stress HR: 97  Rest BP: 162/102 Stress BP: 149/90  Exercise Time (min): n/a METS: n/a   Predicted Max HR: 156 bpm % Max HR: 62.18 bpm Rate Pressure Product: 15714  Dose of Adenosine (mg):  n/a Dose of Lexiscan: 0.4 mg  Dose of Atropine (mg): n/a Dose of Dobutamine: n/a mcg/kg/min (at max HR)  Stress Test Technologist: Leane Para, CCT Nuclear Technologist: Imagene Riches, CNMT   Rest Procedure:  Myocardial perfusion imaging was performed at rest 45 minutes following the intravenous administration of Technetium 33m Sestamibi. Stress Procedure:  The patient received IV Lexiscan 0.4 mg over 15-seconds.  Technetium 60m Sestamibi injected at 30-seconds.  There were no significant changes  with Lexiscan.  Quantitative spect images were obtained after a 45 minute delay.  Transient Ischemic Dilatation (Normal <1.22):  1.0 Lung/Heart Ratio (Normal <0.45):  0.31 QGS EDV:  100 ml QGS ESV:  43 ml LV Ejection Fraction: 57%  Signed by       Rest ECG: NSR with non-specific ST-T wave changes  Stress ECG: No significant change from baseline ECG  QPS Raw Data Images:  Normal; no motion artifact; normal heart/lung ratio. Stress Images:  Normal homogeneous uptake in all areas of the myocardium. Rest Images:  Normal homogeneous uptake in all areas of the myocardium. Subtraction (SDS):  No evidence of ischemia.  Impression Exercise Capacity:  Lexiscan with no exercise. BP Response:  Normal blood pressure response. Clinical Symptoms:  No significant symptoms noted. ECG Impression:  No significant ST segment change suggestive of ischemia. Comparison with Prior Nuclear Study: No images to compare  Overall Impression:  Normal stress nuclear study.  LV Wall Motion:  NL LV Function; NL Wall Motion   Lorretta Harp, MD  10/19/2013 5:55 PM

## 2013-10-21 NOTE — Progress Notes (Signed)
Quick Note:  Released into my chart ______

## 2013-10-31 NOTE — Progress Notes (Signed)
Quick Note:  results released into my chart. ______

## 2013-11-02 ENCOUNTER — Encounter: Payer: Self-pay | Admitting: Family

## 2013-11-02 ENCOUNTER — Ambulatory Visit (INDEPENDENT_AMBULATORY_CARE_PROVIDER_SITE_OTHER): Payer: BC Managed Care – PPO | Admitting: Family

## 2013-11-02 ENCOUNTER — Telehealth: Payer: Self-pay | Admitting: Neurology

## 2013-11-02 VITALS — BP 152/78 | HR 61 | Wt 232.0 lb

## 2013-11-02 DIAGNOSIS — M109 Gout, unspecified: Secondary | ICD-10-CM

## 2013-11-02 DIAGNOSIS — N529 Male erectile dysfunction, unspecified: Secondary | ICD-10-CM

## 2013-11-02 DIAGNOSIS — I10 Essential (primary) hypertension: Secondary | ICD-10-CM

## 2013-11-02 DIAGNOSIS — G4733 Obstructive sleep apnea (adult) (pediatric): Secondary | ICD-10-CM

## 2013-11-02 DIAGNOSIS — E119 Type 2 diabetes mellitus without complications: Secondary | ICD-10-CM

## 2013-11-02 LAB — BASIC METABOLIC PANEL
BUN: 17 mg/dL (ref 6–23)
Calcium: 9.5 mg/dL (ref 8.4–10.5)
Creatinine, Ser: 1.7 mg/dL — ABNORMAL HIGH (ref 0.4–1.5)
GFR: 50.89 mL/min — ABNORMAL LOW (ref >60.00)
Glucose, Bld: 95 mg/dL (ref 70–99)
Potassium: 4.2 mEq/L (ref 3.5–5.1)
Sodium: 138 mEq/L (ref 135–145)

## 2013-11-02 LAB — HEPATIC FUNCTION PANEL
Albumin: 4.2 g/dL (ref 3.5–5.2)
Alkaline Phosphatase: 92 U/L (ref 39–117)
Total Bilirubin: 0.9 mg/dL (ref 0.3–1.2)
Total Protein: 7.8 g/dL (ref 6.0–8.3)

## 2013-11-02 LAB — URIC ACID: Uric Acid, Serum: 10.2 mg/dL — ABNORMAL HIGH (ref 4.0–7.8)

## 2013-11-02 NOTE — Patient Instructions (Signed)

## 2013-11-02 NOTE — Telephone Encounter (Signed)
Please call and notify patient that the recent sleep study confirmed the diagnosis of OSA. He did well with CPAP during the study with significant improvement of the respiratory events. Therefore, I would like start the patient on CPAP at home. I placed the order in the chart.   Arrange for CPAP set up at home through a DME company of patient's choice and fax/route report to PCP and referring MD (if other than PCP).   The patient will also need a follow up appointment with me in 6-8 weeks post set up that has to be scheduled; help the patient schedule this (in a follow-up slot).   Please re-enforce the importance of compliance with treatment and the need for us to monitor compliance data.   Once you have spoken to the patient and scheduled the return appointment, you may close this encounter, thanks,   Letasha Kershaw, MD, PhD [facility] ([facility]    

## 2013-11-02 NOTE — Telephone Encounter (Signed)
I called and left a message for the patient to callback to speak with me Ivin Booty) concerning his recent sleep study results.

## 2013-11-02 NOTE — Progress Notes (Signed)
Subjective:    Patient ID: Jared [name], male    DOB: 1945-12-25, 67 y.o.   MRN: [id number]  HPI 67 year old white male, nonsmoker is in today for a recheck of hypercholesterolemia, gout, hypertension, osteoarthritis, renal insufficiency, and type 2 diabetes. He reports he is doing well. Denies any concerns today. Is not currently exercising or following any particular diet.   Review of Systems  Constitutional: Negative.   HENT: Negative.   Respiratory: Negative.   Cardiovascular: Negative.   Gastrointestinal: Negative.   Endocrine: Negative.   Genitourinary: Negative.   Musculoskeletal: Negative.   Skin: Negative.   Neurological: Negative.   Hematological: Negative.   Psychiatric/Behavioral: Negative.    Past Medical History  Diagnosis Date  . 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  . GOUT 07/26/2007  . HYPERCHOLESTEROLEMIA, BORDERLINE 01/16/2010  . HYPERTENSION 07/26/2007  . OSTEOARTHRITIS 09/28/2007  . PLANTAR FASCIITIS, BILATERAL 04/19/2010  . PROSTATITIS, RECURRENT 11/08/2009  . RENAL INSUFFICIENCY 07/26/2007  . 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 URINARY FREQUENCY, CHRONIC 03/10/2008  . Stroke 01/21/2013    History   Social History  . Marital Status: Married    Spouse Name: N/A    Number of Children: N/A  . Years of Education: N/A   Occupational History  . Not on file.   Social History Main Topics  . Smoking status: Never Smoker   . Smokeless tobacco: Never Used  . Alcohol Use: No     Comment: none  . Drug Use: No  . Sexual Activity: No   Other Topics Concern  . Not on file   Social History Narrative  . No narrative on file    Past Surgical History  Procedure Laterality Date  . Rt arm surgery for gout      Family History  Problem Relation Age of Onset  . Heart disease Mother   . Heart disease Father   . CVA Father   . Diabetes Sister     Allergies  Allergen Reactions  . Other     Strawberries Cant breath     Current Outpatient Prescriptions on File Prior to Visit  Medication Sig Dispense Refill  . amLODipine (NORVASC) 10 MG tablet TAKE 1 TABLET BY MOUTH EVERY DAY  90 tablet  3  . aspirin EC 325 MG tablet Take 1 tablet (325 mg total) by mouth daily.    0  . CIALIS 5 MG tablet TAKE 1 TABLET BY MOUTH EVERY DAY  30 tablet  6  . diazepam (VALIUM) 5 MG tablet TAKE 1 TABLET BY MOUTH EVERY DAY AS NEEDED  30 tablet  0  . doxazosin (CARDURA) 1 MG tablet Take 1 tablet (1 mg total) by mouth at bedtime.  30 tablet  11  . Febuxostat (ULORIC) 80 MG TABS Take 80 mg by mouth daily.       . furosemide (LASIX) 20 MG tablet Take 1 tablet (20 mg total) by mouth daily. PATIENT NEEDS OFFICE VISIT FOR ADDITIONAL REFILLS  30 tablet  5  . indomethacin (INDOCIN) 50 MG capsule TAKE 1 CAPSULE THREE TIMES A DAY  270 capsule  0  . irbesartan (AVAPRO) 300 MG tablet TAKE 1 TABLET BY MOUTH EVERY DAY  30 tablet  11  . labetalol (NORMODYNE) 300 MG tablet Take 1 tablet (300 mg total) by mouth 2 (two) times daily.  60 tablet  11  . lisinopril (PRINIVIL,ZESTRIL) 40 MG tablet TAKE 1 TABLET BY MOUTH EVERY DAY  90 tablet  3  .  metFORMIN (GLUCOPHAGE) 500 MG tablet TAKE 1 TABLET BY MOUTH TWICE A DAY WITH A MEAL  180 tablet  3  . simvastatin (ZOCOR) 20 MG tablet TAKE 1 TABLET BY MOUTH EVERY DAY AT 6 PM  90 tablet  3   No current facility-administered medications on file prior to visit.    BP 152/78  Pulse 61  Wt 232 lb (105.235 kg)  SpO2 98%chart    Objective:   Physical Exam  Constitutional: He is oriented to person, place, and time. He appears well-developed and well-nourished.  HENT:  Right Ear: External ear normal.  Left Ear: External ear normal.  Nose: Nose normal.  Mouth/Throat: Oropharynx is clear and moist.  Neck: Normal range of motion. Neck supple. No thyromegaly present.  Cardiovascular: Normal rate, regular rhythm and normal heart sounds.   Pulmonary/Chest: Effort normal.  Abdominal: Soft. Bowel sounds are  normal.  Musculoskeletal: Normal range of motion.  Neurological: He is alert and oriented to person, place, and time.  Skin: Skin is warm.  Psychiatric: He has a normal mood and affect.          Assessment & Plan:  Assessment: 1. Type 2 diabetes 2. Hypercholesterolemia 3. Osteoarthritis 4. Hypertension  Plan: Encouraged healthy diet, exercise, nightly he checks. Medication compliance. Initial cardiac symptoms any questions or concerns. Recheck as scheduled, and as needed.

## 2013-11-03 LAB — HEMOGLOBIN A1C: Hgb A1c MFr Bld: 5.9 % (ref 4.6–6.5)

## 2013-11-07 ENCOUNTER — Encounter: Payer: Self-pay | Admitting: Cardiovascular Disease

## 2013-11-07 NOTE — Progress Notes (Signed)
Patient ID: Jared [name], male   DOB: May 16, 1946, 67 y.o.   MRN: [id number]     PATIENT PROFILE: [name]  is a 67 year old male who presents for cardiology evaluation through the courtesy of Christell Faith, [facility] for further evaluation of his blood pressure and episodes of dizziness.   HPI: Mr. has a greater than 30 year history of hypertension, a history of type 2 diabetes mellitus, hyperlipidemia, gout, as well as obstructive sleep apnea. He has been on amlodipine 10 mg, furosemide 20 mg, irbesartan 300 mg, labetalol 300 mg twice a day, and lisinopril 40 mg daily for blood pressure control. He has a history of obstructive sleep apnea and did not do well with CPAP therapy but recently was placed on a BiPAP total unit with ultimate plans for a BiPAP titration. 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 Carotid duplex imaging in 01/22/2013 showed mild soft plaque in the distal common carotid and origin of the ICA bilaterally without significant stenoses. A 2-D echo Doppler study at that time showed an ejection fraction of 60-65% and there was evidence for grade 1 diastolic dysfunction. He did have mild mitral regurgitation and moderate left atrial dilatation. He recently has noted some episodes of dizziness. He denies any definitive precipitation with orthostatic changes. He denies chest tightness. He denies wheezing. He denies syncope. He is unaware of palpitations.   Past Medical History  Diagnosis Date  . 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  . GOUT 07/26/2007  . HYPERCHOLESTEROLEMIA, BORDERLINE 01/16/2010  . HYPERTENSION 07/26/2007  . OSTEOARTHRITIS 09/28/2007  . PLANTAR FASCIITIS, BILATERAL 04/19/2010  . PROSTATITIS, RECURRENT 11/08/2009  . RENAL INSUFFICIENCY 07/26/2007  . 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 URINARY FREQUENCY, CHRONIC 03/10/2008  . Stroke 01/21/2013    Past Surgical History  Procedure Laterality Date    . Rt arm surgery for gout      Allergies  Allergen Reactions  . Other     Strawberries Cant breath    Current Outpatient Prescriptions  Medication Sig Dispense Refill  . amLODipine (NORVASC) 10 MG tablet TAKE 1 TABLET BY MOUTH EVERY DAY  90 tablet  3  . aspirin EC 325 MG tablet Take 1 tablet (325 mg total) by mouth daily.    0  . CIALIS 5 MG tablet TAKE 1 TABLET BY MOUTH EVERY DAY  30 tablet  6  . diazepam (VALIUM) 5 MG tablet TAKE 1 TABLET BY MOUTH EVERY DAY AS NEEDED  30 tablet  0  . doxazosin (CARDURA) 1 MG tablet Take 1 tablet (1 mg total) by mouth at bedtime.  30 tablet  11  . Febuxostat (ULORIC) 80 MG TABS Take 80 mg by mouth daily.       . furosemide (LASIX) 20 MG tablet Take 1 tablet (20 mg total) by mouth daily. PATIENT NEEDS OFFICE VISIT FOR ADDITIONAL REFILLS  30 tablet  5  . indomethacin (INDOCIN) 50 MG capsule TAKE 1 CAPSULE THREE TIMES A DAY  270 capsule  0  . irbesartan (AVAPRO) 300 MG tablet TAKE 1 TABLET BY MOUTH EVERY DAY  30 tablet  11  . labetalol (NORMODYNE) 300 MG tablet Take 1 tablet (300 mg total) by mouth 2 (two) times daily.  60 tablet  11  . lisinopril (PRINIVIL,ZESTRIL) 40 MG tablet TAKE 1 TABLET BY MOUTH EVERY DAY  90 tablet  3  . metFORMIN (GLUCOPHAGE) 500 MG tablet TAKE 1 TABLET BY MOUTH TWICE  A DAY WITH A MEAL  180 tablet  3  . simvastatin (ZOCOR) 20 MG tablet TAKE 1 TABLET BY MOUTH EVERY DAY AT 6 PM  90 tablet  3   No current facility-administered medications for this visit.    Social history is notable in that he is married for 19 years. He has 4 children 4 grandchildren 3 great-grandchildren. 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 He completed 12th grade of education there is no tobacco use. He does not drink alcohol. He does not routinely exercise.  Family History  Problem Relation Age of Onset  . Heart disease Mother   . Heart disease Father   . CVA Father   . Diabetes Sister     ROS is negative for fever chills or night sweats. He  denies skin rash. He does note some mild dizziness intermittently. There is no syncope He denies bleeding. He denies cough or wheezing. He denies excess sputum production. He denies chest pain. He denies palpitations. He denies nausea vomiting or diarrhea. There is no change in bowel or bladder habits., Remotely he has had difficulty with prostatitis. There is a history of gout, but also is a history of hyperlipidemia. He does have type 2 diabetes. He has been on simvastatin for hyperlipidemia. He does have some difficulty with erectile function. He did see residual daytime sleepiness. He denies any parasomnias. There is no restless legs. Other comprehensive 14 point system review is negative.  PE BP 148/98  Pulse 62  Ht 5\' 8"  (1.727 m)  Wt 232 lb 9.6 oz (105.507 kg)  BMI 35.38 kg/m2 Repeat blood pressure by me was 160/90 without significant orthostatic change. General: Alert, oriented, no distress.  Skin: normal turgor, no rashes HEENT: Normocephalic, atraumatic. Pupils round and reactive; sclera anicteric; Fundi mild arteriolar narrowing. Nose without nasal septal hypertrophy Mouth/Parynx benign; Mallinpatti scale 3 Neck: No JVD, no carotid bruits Lungs: clear to ausculatation and percussion; no wheezing or rales Heart: RRR, s1 s2 normal 1/6 systolic murmur  Abdomen: soft, nontender; no hepatosplenomehaly, BS+; abdominal aorta nontender and not dilated by palpation. Pulses 2+ Musculoskeletal: Without obvious joint deformities. Extremities: no clubbing cyanosis or edema, Homan's sign negative  Neurologic: grossly normal without focal findings. Psychologic: Normal mood and affect    ECG: Sinus rhythm at 62 beats per minute with inferolateral ST-T changes. PR interval 1:30 milliseconds QTc interval 460 ms.  LABS:  BMET    Component Value Date/Time   NA 138 11/02/2013 1444   K 4.2 11/02/2013 1444   CL 102 11/02/2013 1444   CO2 27 11/02/2013 1444   GLUCOSE 95 11/02/2013 1444    GLUCOSE 80 11/02/2006 1131   BUN 17 11/02/2013 1444   CREATININE 1.7* 11/02/2013 1444   CREATININE 1.48* 10/17/2013 1013   CALCIUM 9.5 11/02/2013 1444   GFRNONAA 38* 01/23/2013 0600   GFRAA 44* 01/23/2013 0600     Hepatic Function Panel     Component Value Date/Time   PROT 7.8 11/02/2013 1444   ALBUMIN 4.2 11/02/2013 1444   AST 21 11/02/2013 1444   ALT 26 11/02/2013 1444   ALKPHOS 92 11/02/2013 1444   BILITOT 0.9 11/02/2013 1444   BILIDIR 0.0 11/02/2013 1444     CBC    Component Value Date/Time   WBC 5.1 10/17/2013 1013   WBC 8.6 09/28/2013 1919   RBC 4.57 10/17/2013 1013   RBC 4.61* 09/28/2013 1919   HGB 13.2 10/17/2013 1013   HGB 13.7* 09/28/2013 1919   HCT 37.4* 10/17/2013  1013   HCT 41.7* 09/28/2013 1919   PLT 268 10/17/2013 1013   MCV 81.8 10/17/2013 1013   MCV 90.5 09/28/2013 1919   MCH 28.9 10/17/2013 1013   MCH 29.7 09/28/2013 1919   MCHC 35.3 10/17/2013 1013   MCHC 32.9 09/28/2013 1919   RDW 14.5 10/17/2013 1013   LYMPHSABS 1.6 01/21/2013 1535   MONOABS 0.7 01/21/2013 1535   EOSABS 0.3 01/21/2013 1535   BASOSABS 0.1 01/21/2013 1535     BNP No results found for this basename: probnp    Lipid Panel     Component Value Date/Time   CHOL 157 01/22/2013 0530   TRIG 128 10/17/2013 1013   TRIG 181* 01/22/2013 0530   HDL 28* 01/22/2013 0530   CHOLHDL 5.6 01/22/2013 0530   VLDL 36 01/22/2013 0530   LDLCALC 61 10/17/2013 1013   LDLCALC 93 01/22/2013 0530     RADIOLOGY: No results found.   ASSESSMENT AND PLAN: My impression is that [name] is a 67 year old gentleman has a greater than 30 year history of hypertension. Prior echo Doppler study has demonstrated normal systolic function with evidence for grade 1 diastolic dysfunction. He did have moderate left atrial dilatation. Presently, he is hypertensive with blood pressure when taken by me at 160/90. He currently is on high blood pressure medications. He does have ST-T changes which may be due to LVH with  repolarization changes but certainly ischemia needs to be excluded. I  am scheduling him for a [place] study for further evaluation. I'm electing to add Cardura 1 mg initially at bedtime. Laboratory will be done consisting of a conference of metabolic panel, NMR profile, and TSH. He will be undergoing a BiPAP titration for optimal treatment of his severe obstructive sleep apnea. We may ultimately plan to do a renal duplex scan to further evaluate for potential renovascular etiology to his blood pressure. I see him back in the office in 4 weeks for followup evaluation and further recommendations will be made at that time.   Troy Sine, MD, [facility] 11/07/2013 5:19 PM

## 2013-11-08 ENCOUNTER — Encounter: Payer: Self-pay | Admitting: Cardiovascular Disease

## 2013-11-08 ENCOUNTER — Encounter: Payer: Self-pay | Admitting: *Deleted

## 2013-11-11 ENCOUNTER — Ambulatory Visit (INDEPENDENT_AMBULATORY_CARE_PROVIDER_SITE_OTHER): Payer: BC Managed Care – PPO | Admitting: Cardiovascular Disease

## 2013-11-11 ENCOUNTER — Encounter: Payer: Self-pay | Admitting: Cardiovascular Disease

## 2013-11-11 VITALS — BP 138/80 | HR 72 | Ht 68.0 in | Wt 232.4 lb

## 2013-11-11 DIAGNOSIS — I10 Essential (primary) hypertension: Secondary | ICD-10-CM

## 2013-11-11 DIAGNOSIS — G4733 Obstructive sleep apnea (adult) (pediatric): Secondary | ICD-10-CM

## 2013-11-11 DIAGNOSIS — E119 Type 2 diabetes mellitus without complications: Secondary | ICD-10-CM

## 2013-11-11 DIAGNOSIS — E785 Hyperlipidemia, unspecified: Secondary | ICD-10-CM

## 2013-11-11 DIAGNOSIS — M1A00X1 Idiopathic chronic gout, unspecified site, with tophus (tophi): Secondary | ICD-10-CM

## 2013-11-11 NOTE — Patient Instructions (Signed)
Your physician recommends that you schedule a follow-up appointment in: 6 MONTHS. No changes were made today in your therapy.

## 2013-11-13 ENCOUNTER — Encounter: Payer: Self-pay | Admitting: Cardiovascular Disease

## 2013-11-13 DIAGNOSIS — G4733 Obstructive sleep apnea (adult) (pediatric): Secondary | ICD-10-CM | POA: Insufficient documentation

## 2013-11-13 NOTE — Progress Notes (Signed)
Patient ID: Jared [name], male   DOB: [birth year], 67 y.o.   MRN: [id number]       HPI: Jared [name]  is a 67 year old male who presents for follow-up cardiology evaluation   for further evaluation of his blood pressure and episodes of dizziness. I had seen him in approximately one month ago for initial cardiology evaluation.  Jared [name] has a 30+ year history of hypertension, a history of type 2 diabetes mellitus, hyperlipidemia, gout, as well as obstructive sleep apnea. He has been on amlodipine 10 mg, furosemide 20 mg, irbesartan 300 mg, labetalol 300 mg twice a day, and lisinopril 40 mg daily for blood pressure control. He has a history of obstructive sleep apnea and did not do well with CPAP therapy but recently was placed on a BiPAP Auto unit with ultimate plans for a BiPAP titration. 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 Carotid duplex imaging in 01/22/2013 showed mild soft plaque in the distal common carotid and origin of the ICA bilaterally without significant stenoses. A 2-D echo Doppler study at that time showed an ejection fraction of 60-65%  with grade 1 diastolic dysfunction. He did have mild mitral regurgitation and moderate left atrial dilatation. He recently has noted some episodes of dizziness. When I saw him one month ago, he still had stage I hypertension with a blood pressure 148/98 by treatment with irbesartan, amlodipine, lisinopril, labetalol, and furosemide. At that time, instituted low-dose Cardura 1 mg which she has tolerated well. He states he has noticed improvement in his urinary flow. He did note a vague rash that developed near his sock line and ankles and he was concerned if this was related to the Cardura and consequently started to break his 1 mg tablet in half. He was concerned perhaps this was a drug reaction. He has been using his BiPAP. He is sleeping well. He denies breakthrough snoring. He denies residual daytime  sleepiness. Since I last saw him, he underwent a nuclear perfusion study which was normal. Post-rest ejection fraction was 57%. There was no evidence for scar or ischemia. Recent laboratory has shown an LDL particle number of 1059, calculated LDL cholesterol 61, total cholesterol 121, HDL 34 with a reduced HDL particle #25.2. He had 768 small LDL particles which are elevated. His insulin resistance with elevated at 73 but the patient is diabetic. Hemoglobin 13.2 hematocrit 37.4. BUN 15 creatinine 1.48. Uric acid was increased to 10.2. Hemoglobin A1c was 5.9. He had normal liver function studies.  Past Medical History  Diagnosis Date  . 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  . GOUT 07/26/2007  . HYPERCHOLESTEROLEMIA, BORDERLINE 01/16/2010  . HYPERTENSION 07/26/2007  . OSTEOARTHRITIS 09/28/2007  . PLANTAR FASCIITIS, BILATERAL 04/19/2010  . PROSTATITIS, RECURRENT 11/08/2009  . RENAL INSUFFICIENCY 07/26/2007  . 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 URINARY FREQUENCY, CHRONIC 03/10/2008  . Stroke 01/21/2013    Past Surgical History  Procedure Laterality Date  . Rt arm surgery for gout      Allergies  Allergen Reactions  . Other     Strawberries Cant breath    Current Outpatient Prescriptions  Medication Sig Dispense Refill  . amLODipine (NORVASC) 10 MG tablet TAKE 1 TABLET BY MOUTH EVERY DAY  90 tablet  3  . aspirin EC 325 MG tablet Take 1 tablet (325 mg total) by mouth daily.    0  . CIALIS 5 MG tablet TAKE 1 TABLET BY MOUTH  EVERY DAY  30 tablet  6  . diazepam (VALIUM) 5 MG tablet TAKE 1 TABLET BY MOUTH EVERY DAY AS NEEDED  30 tablet  0  . doxazosin (CARDURA) 1 MG tablet Take 1 tablet (1 mg total) by mouth at bedtime.  30 tablet  11  . Febuxostat (ULORIC) 80 MG TABS Take 80 mg by mouth daily.       . furosemide (LASIX) 20 MG tablet Take 1 tablet (20 mg total) by mouth daily. PATIENT NEEDS OFFICE VISIT FOR ADDITIONAL REFILLS  30 tablet  5  . indomethacin (INDOCIN)  50 MG capsule TAKE 1 CAPSULE THREE TIMES A DAY  270 capsule  0  . irbesartan (AVAPRO) 300 MG tablet TAKE 1 TABLET BY MOUTH EVERY DAY  30 tablet  11  . labetalol (NORMODYNE) 300 MG tablet Take 1 tablet (300 mg total) by mouth 2 (two) times daily.  60 tablet  11  . lisinopril (PRINIVIL,ZESTRIL) 40 MG tablet TAKE 1 TABLET BY MOUTH EVERY DAY  90 tablet  3  . metFORMIN (GLUCOPHAGE) 500 MG tablet TAKE 1 TABLET BY MOUTH TWICE A DAY WITH A MEAL  180 tablet  3  . simvastatin (ZOCOR) 20 MG tablet TAKE 1 TABLET BY MOUTH EVERY DAY AT 6 PM  90 tablet  3   No current facility-administered medications for this visit.    Social history is notable in that he is married for 19 years. He has 4 children 4 grandchildren 3 great-grandchildren. 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 He completed 12th grade of education there is no tobacco use. He does not drink alcohol. He does not routinely exercise.  Family History  Problem Relation Age of Onset  . Heart disease Mother   . Heart disease Father   . CVA Father   . Diabetes Sister     ROS is negative for fever chills or night sweats. He noted a skin rash in the region of where his socks are bilaterally. He does note some mild dizziness intermittently. There is no syncope He denies bleeding. He denies cough or wheezing. He denies excess sputum production. He denies chest pain. He denies palpitations. He denies nausea vomiting or diarrhea. There is no change in bowel or bladder habits., Remotely he has had difficulty with prostatitis. There is a history of gout. He has a  history of hyperlipidemia. He does have type 2 diabetes. He has been on simvastatin for hyperlipidemia. He does have some difficulty with erectile function. He denies residual daytime sleepiness. He denies any parasomnias. There is no restless legs. Other comprehensive 12 point system review is negative.  PE BP 138/80  Pulse 72  Ht 5\' 8"  (1.727 m)  Wt 232 lb 6.4 oz (105.416 kg)  BMI 35.34  kg/m2 Repeat blood pressure by me was 160/90 without significant orthostatic change. General: Alert, oriented, no distress.  Skin: normal turgor, no rashes HEENT: Normocephalic, atraumatic. Pupils round and reactive; sclera anicteric; Fundi mild arteriolar narrowing. Nose without nasal septal hypertrophy Mouth/Parynx benign; Mallinpatti scale 3 Neck: No JVD, no carotid bruits Lungs: clear to ausculatation and percussion; no wheezing or rales Heart: RRR, s1 s2 normal 1/6 systolic murmur  Abdomen: soft, nontender; no hepatosplenomehaly, BS+; abdominal aorta nontender and not dilated by palpation. Pulses 2+ Musculoskeletal: Without obvious joint deformities. Extremities: no clubbing cyanosis or edema, Homan's sign negative  Neurologic: grossly normal without focal findings. Psychologic: Normal mood and affect   LABS:  BMET    Component Value Date/Time   NA  138 11/02/2013 1444   K 4.2 11/02/2013 1444   CL 102 11/02/2013 1444   CO2 27 11/02/2013 1444   GLUCOSE 95 11/02/2013 1444   GLUCOSE 80 11/02/2006 1131   BUN 17 11/02/2013 1444   CREATININE 1.7* 11/02/2013 1444   CREATININE 1.48* 10/17/2013 1013   CALCIUM 9.5 11/02/2013 1444   GFRNONAA 38* 01/23/2013 0600   GFRAA 44* 01/23/2013 0600     Hepatic Function Panel     Component Value Date/Time   PROT 7.8 11/02/2013 1444   ALBUMIN 4.2 11/02/2013 1444   AST 21 11/02/2013 1444   ALT 26 11/02/2013 1444   ALKPHOS 92 11/02/2013 1444   BILITOT 0.9 11/02/2013 1444   BILIDIR 0.0 11/02/2013 1444     CBC    Component Value Date/Time   WBC 5.1 10/17/2013 1013   WBC 8.6 09/28/2013 1919   RBC 4.57 10/17/2013 1013   RBC 4.61* 09/28/2013 1919   HGB 13.2 10/17/2013 1013   HGB 13.7* 09/28/2013 1919   HCT 37.4* 10/17/2013 1013   HCT 41.7* 09/28/2013 1919   PLT 268 10/17/2013 1013   MCV 81.8 10/17/2013 1013   MCV 90.5 09/28/2013 1919   MCH 28.9 10/17/2013 1013   MCH 29.7 09/28/2013 1919   MCHC 35.3 10/17/2013 1013   MCHC 32.9  09/28/2013 1919   RDW 14.5 10/17/2013 1013   LYMPHSABS 1.6 01/21/2013 1535   MONOABS 0.7 01/21/2013 1535   EOSABS 0.3 01/21/2013 1535   BASOSABS 0.1 01/21/2013 1535     BNP No results found for this basename: probnp    Lipid Panel     Component Value Date/Time   CHOL 157 01/22/2013 0530   TRIG 128 10/17/2013 1013   TRIG 181* 01/22/2013 0530   HDL 28* 01/22/2013 0530   CHOLHDL 5.6 01/22/2013 0530   VLDL 36 01/22/2013 0530   LDLCALC 61 10/17/2013 1013   LDLCALC 93 01/22/2013 0530     RADIOLOGY: No results found.   ASSESSMENT AND PLAN: My impression is that Jared [name] is a 67 year old gentleman with a long-standing history of hypertension, as well as a history of type 2 diabetes mellitus, hyperlipidemia, gout, and obstructive sleep apnea. His blood pressure today is significantly improved with the addition of low-dose Cardura. I recommended he resume the 1 mg dose. I do not believe his recent rash which was around the ankles was due to to a fixed drug corruption but rather most likely perhaps 2 to his socks or recent detergent. I did review his nuclear perfusion study which shows normal perfusion. Most likely his ST-T changes or due to his LVH. sSnce his blood pressure is controlled I will continue him on this regimen. However, if this becomes uncontrolled renal duplex imaging will be recommended. As long as he remains stable, I will see him in 6 months for Cardiologic reassessment.  Troy Sine, MD, [facility] 11/13/2013 10:52 AM

## 2013-12-25 ENCOUNTER — Other Ambulatory Visit: Payer: Self-pay | Admitting: Internal Medicine

## 2013-12-27 ENCOUNTER — Encounter: Payer: Self-pay | Admitting: Neurology

## 2013-12-27 NOTE — Progress Notes (Signed)
Quick Note:  I reviewed the patient's CPAP compliance data from 11/16/2013 to 12/22/2013, which is a total of 37 days, during which time the patient used CPAP only 22 days. The average usage for all days was only 2 hours and 31 minutes. The average usage for when he was using the CPAP was 4 hours and 14 minutes. The percent used days greater than 4 hours was only 32 %, indicating poor compliance. The residual AHI was 0.9 per hour, indicating an adequate treatment pressure of 11 cwp with EPR of 2. I will review this data with the patient at the next office visit, which is coming up on 01/02/2014 at 10:30 AM, provide feedback and additional troubleshooting if need be at the time.  Star Age, MD, PhD [facility] ([facility])   ______

## 2014-01-02 ENCOUNTER — Ambulatory Visit (INDEPENDENT_AMBULATORY_CARE_PROVIDER_SITE_OTHER): Payer: BC Managed Care – PPO | Admitting: Neurology

## 2014-01-02 ENCOUNTER — Encounter: Payer: Self-pay | Admitting: Neurology

## 2014-01-02 VITALS — BP 152/88 | HR 68 | Temp 98.8°F | Ht 68.0 in | Wt 235.0 lb

## 2014-01-02 DIAGNOSIS — Z9989 Dependence on other enabling machines and devices: Principal | ICD-10-CM

## 2014-01-02 DIAGNOSIS — I639 Cerebral infarction, unspecified: Secondary | ICD-10-CM

## 2014-01-02 DIAGNOSIS — I6350 Cerebral infarction due to unspecified occlusion or stenosis of unspecified cerebral artery: Secondary | ICD-10-CM

## 2014-01-02 DIAGNOSIS — G4733 Obstructive sleep apnea (adult) (pediatric): Secondary | ICD-10-CM

## 2014-01-02 DIAGNOSIS — I10 Essential (primary) hypertension: Secondary | ICD-10-CM

## 2014-01-02 NOTE — Patient Instructions (Addendum)
Please continue using your CPAP regularly. While your insurance requires that you use CPAP at least 4 hours each night on 70% of the nights, I recommend, that you not skip any nights and use it throughout the night if you can. Getting used to CPAP does take time and patience and discipline. Untreated obstructive sleep apnea when it is moderate to severe can have an adverse impact on cardiovascular health and raise her risk for heart disease, arrhythmias, hypertension, congestive heart failure, stroke and diabetes. Untreated obstructive sleep apnea causes sleep disruption, nonrestorative sleep, and sleep deprivation. This can have an impact on your day to day functioning and cause daytime sleepiness and impairment of cognitive function, memory loss, mood disturbance, and problems focussing. Using CPAP regularly can improve these symptoms.  I have placed an order for a nasal mask and we will also get in touch with your DME company regarding the lapses in treatment and the discrepancy between the report and your feedback.

## 2014-01-02 NOTE — Progress Notes (Signed)
Subjective:    Patient ID: Jared [name] is a 68 y.o. male.  HPI  Interim history:   Jared [name] is a very pleasant 68 year old right-handed gentleman with an underlying medical history of HTN, left cerebellar stroke in January 2014 (at which time he was changed from baby aspirin to adult size aspirin), DM, HLP, insomnia, and severe OSA, who presents for followup consultation after his recent sleep study. He is unaccompanied today. I first met him on 10/05/2013, and which time we discussed his split-night sleep study at [facility] form 03/08/2013: His total AHI was 82.2 per hour. His O2 nadir was 77%. His sleep efficiency was 88.3%. He had an increased percentage of stage II sleep, absence of deep sleep and REM sleep. He was titrated on CPAP, but had ongoing sleep disordered breathing. His residual AHI ranged from 14-85 per hour. He was titrated on CPAP from 4 cm to 14 cm and then switched to BiPAP on which he did not do well. He was titrated on 15/12-19/16 cm and his with AHI ranged from 60-85 per hour. He was placed on auto bipap of up to 19/16 cm, but c/o residual EDS, non-restorative sleep, difficulty tolerating the pressures and too much pressure. There was no compliance data available at the time. I suggested that he return for a sleep study and he had a split-night sleep study on 10/18/2013. I went over his test results with him in detail today. His baseline sleep efficiency was reduced severely at 45.7% with a latency to sleep of 47.5 minutes and the wake after sleep onset of only 3.5 minutes. His baseline arousal index was markedly elevated at 41.9 arousals per hour due primarily to apneas and hypopneas. He had an increased percentage of light stage sleep, absence of slow-wave sleep and absence of REM sleep prior to CPAP initiation. His AHI was 37.7 per hour. His baseline oxygen saturation was 93%, his nadir was 82%. He was then titrated on CPAP from 5-11 cm of water pressure and had a  reduction of his AHI 2.7 events per hour at the final pressure. Supine REM sleep was achieved. His average oxygen saturation post CPAP was 94%, his nadir was 84%. Mild periodic leg movements were noted after CPAP initiation with minimal arousals noted. I reviewed his compliance data from 11/16/2013 through 12/22/2013 which is a total of 37 days during which time he was CPAP only 22 nights. His percent used days greater than 4 hours was only 32%, indicating poor compliance. His pressure is set at 11 cm with DPR of 2. Residual AHI is low at store 0.9 per hour and his leak has also been low. His average usage is 2 hours and 31 minutes for all nights.  Today, I also reviewed his compliance data from the machine, from the last 30 days, from 12/03/2013 through 01/01/2014, during which time he uses CPAP on 24 nights. His percent used days greater than 4 hours was only 47%, and while this is improved from the beginning, it is still only mediocre compliance. His average usage for all days was 3 hours and 36 minutes, he is residual AHI was 0.9 per hour, indicating an appropriate pressure setting. His leak was low.  Today, he reports sleeping much better with CPAP and being able tolerate the mask and pressure better than with BiPAP. He sleeps with the TV on, which turns off around 2 AM. He has mild residual balance issues. Interestingly, he reports better adherence than what is reported  in his compliance data. For example, in the last 30 days he says that he only skipped it for about 2 days due to running nose. He is 30 day down the Road indicates that he skipped it for 6 days. All in all, he reports being more adherent to therapy then his compliance data recording. We will have to get in touch with his DME company regarding this. Also he reports nasal soreness from the nasal pillows.  His Past Medical History Is Significant For: Past Medical History  Diagnosis Date  . 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   . GOUT 07/26/2007  . HYPERCHOLESTEROLEMIA, BORDERLINE 01/16/2010  . HYPERTENSION 07/26/2007  . OSTEOARTHRITIS 09/28/2007  . PLANTAR FASCIITIS, BILATERAL 04/19/2010  . PROSTATITIS, RECURRENT 11/08/2009  . RENAL INSUFFICIENCY 07/26/2007  . 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 URINARY FREQUENCY, CHRONIC 03/10/2008  . Stroke 01/21/2013    His Past Surgical History Is Significant For: Past Surgical History  Procedure Laterality Date  . Rt arm surgery for gout      His Family History Is Significant For: Family History  Problem Relation Age of Onset  . Heart disease Mother   . Heart disease Father   . CVA Father   . Diabetes Sister     His Social History Is Significant For: History   Social History  . Marital Status: Married    Spouse Name: N/A    Number of Children: N/A  . Years of Education: N/A   Social History Main Topics  . Smoking status: Never Smoker   . Smokeless tobacco: Never Used  . Alcohol Use: No     Comment: none  . Drug Use: No  . Sexual Activity: No   Other Topics Concern  . None   Social History Narrative  . None    His Allergies Are:  Allergies  Allergen Reactions  . Other     Strawberries Cant breath  :   His Current Medications Are:  Outpatient Encounter Prescriptions as of 01/02/2014  Medication Sig  . amLODipine (NORVASC) 10 MG tablet TAKE 1 TABLET BY MOUTH EVERY DAY  . aspirin EC 325 MG tablet Take 1 tablet (325 mg total) by mouth daily.  Marland Kitchen CIALIS 5 MG tablet TAKE 1 TABLET BY MOUTH EVERY DAY  . diazepam (VALIUM) 5 MG tablet TAKE 1 TABLET BY MOUTH EVERY DAY AS NEEDED  . doxazosin (CARDURA) 1 MG tablet Take 1 tablet (1 mg total) by mouth at bedtime.  . Febuxostat (ULORIC) 80 MG TABS Take 80 mg by mouth daily.   . furosemide (LASIX) 20 MG tablet Take 1 tablet (20 mg total) by mouth daily. PATIENT NEEDS OFFICE VISIT FOR ADDITIONAL REFILLS  . indomethacin (INDOCIN) 50 MG capsule TAKE 1 CAPSULE THREE TIMES A DAY  . irbesartan  (AVAPRO) 300 MG tablet TAKE 1 TABLET BY MOUTH EVERY DAY  . labetalol (NORMODYNE) 300 MG tablet Take 1 tablet (300 mg total) by mouth 2 (two) times daily.  Marland Kitchen lisinopril (PRINIVIL,ZESTRIL) 40 MG tablet TAKE 1 TABLET BY MOUTH EVERY DAY  . lisinopril (PRINIVIL,ZESTRIL) 40 MG tablet TAKE 1 TABLET BY MOUTH EVERY DAY  . metFORMIN (GLUCOPHAGE) 500 MG tablet TAKE 1 TABLET BY MOUTH TWICE A DAY WITH A MEAL  . simvastatin (ZOCOR) 20 MG tablet TAKE 1 TABLET BY MOUTH EVERY DAY AT 6 PM  :  Review of Systems:  Out of a complete 14 point review of systems, all are reviewed and negative with the exception of these  symptoms as listed below:   Review of Systems  Constitutional: Positive for fatigue.  HENT: Negative.   Eyes: Positive for visual disturbance.  Respiratory: Negative.   Cardiovascular: Negative.   Gastrointestinal: Negative.   Endocrine: Positive for polydipsia.  Genitourinary: Negative.   Musculoskeletal: Negative.   Skin: Negative.   Allergic/Immunologic: Negative.   Neurological: Positive for weakness.  Hematological: Negative.   Psychiatric/Behavioral: Negative.     Objective:  Neurologic Exam  Physical Exam Physical Examination:   Filed Vitals:   01/02/14 1021  BP: 152/88  Pulse: 68  Temp: 98.8 F (37.1 C)    General Examination: The patient is a very pleasant 68 y.o. male in no acute distress. He appears well-developed and well-nourished and well groomed. He is obese.  HEENT: Normocephalic, atraumatic, pupils are equal, round and reactive to light and accommodation. Funduscopic exam is normal with sharp disc margins noted. Extraocular tracking is good without limitation to gaze excursion or nystagmus noted. Normal smooth pursuit is noted. Hearing is grossly intact. Face is symmetric with normal facial animation and normal facial sensation. Speech is clear with no dysarthria noted. There is no hypophonia. There is no lip, neck/head, jaw or voice tremor. Neck is supple with  full range of passive and active motion. There are no carotid bruits on auscultation. Oropharynx exam reveals: mild mouth dryness, adequate dental hygiene and marked airway crowding, due to thick soft palate, large uvula, large tongue. Mallampati is class III. Tongue protrudes centrally and palate elevates symmetrically. size/absent. Neck size is 18 ''. Nasal inspection reveals no significant sore spots in his nasal passages.  Chest: Clear to auscultation without wheezing, rhonchi or crackles noted.  Heart: S1+S2+0, regular and normal without murmurs, rubs or gallops noted.   Abdomen: Soft, non-tender and non-distended with normal bowel sounds appreciated on auscultation.  Extremities: There is no pitting edema in the distal lower extremities bilaterally. Pedal pulses are intact.  Skin: Warm and dry without trophic changes noted. There are no varicose veins.  Musculoskeletal: exam reveals no obvious joint deformities, tenderness or joint swelling or erythema.   Neurologically:  Mental status: The patient is awake, alert and oriented in all 4 spheres. His memory, attention, language and knowledge are appropriate. There is no aphasia, agnosia, apraxia or anomia. Speech is clear with normal prosody and enunciation. Thought process is linear. Mood is congruent and affect is normal.  Cranial nerves are as described above under HEENT exam. In addition, shoulder shrug is normal with equal shoulder height noted. Motor exam: Normal bulk, strength and tone is noted. There is no drift, tremor or rebound. Romberg is negative. Reflexes are 2+ throughout. Toes are downgoing bilaterally. Fine motor skills are intact with normal finger taps, normal hand movements, normal rapid alternating patting, normal foot taps and normal foot agility, with the exception of mild difficulty with foot taps on the L, unchanged.  Cerebellar testing shows no dysmetria or intention tremor on finger to nose testing. Heel to shin is  unremarkable bilaterally. There is no truncal or gait ataxia.  Sensory exam is intact to light touch, pinprick, vibration, temperature sense in the upper and lower extremities.  Gait, station and balance are unremarkable. No veering to one side is noted. No leaning to one side is noted. Posture is age-appropriate and stance is narrow based. No problems turning are noted. He turns en bloc. Tandem walk is difficult for him. Intact toe and heel stance is noted.  Assessment and Plan:   In summary, Jared [name] is a very pleasant 68 y.o.-year old male with an underlying medical history of HTN, left cerebellar stroke in January 2014 (at which time he was changed from baby aspirin to adult size aspirin), with residual mild balance problems and coordination problems on the left, DM, HLP, insomnia, and severe OSA, who presents for followup consultation of his sleep apnea, after his recent sleep study. He is established on CPAP therapy at a treatment pressure of 11 cm with EPR of 2. Today I had a long discussion with him about his medical history, his severe sleep apnea diagnosis, and treatment with CPAP. He previously was on auto BiPAP and had been struggling with compliance. We discussed his compliance data from the initial treatment period, as well as in the last 30 days. I explained sleep study findings in detail to him as well. I encouraged him to be fully adherent to therapy. Because of the discrepancy between his report and the downloaded data I will get in touch with his DME company to see what the problem may be. In addition, I would like for him to be able to try a nasal mask because of his nasal soreness reported. He is advised to use CPAP every night all night. He is advised about the importance of treating his obstructive sleep apnea. We talked about healthy lifestyle in general as well. Overall, he indicates improved sleep and a better experience with CPAP as opposed to when he was on auto  BiPAP.   I encouraged the patient to eat healthy, exercise daily and keep well hydrated, to keep a scheduled bedtime and wake time routine, to not skip any meals and eat healthy snacks in between meals and to have protein with every meal. I stressed the importance of regular exercise. He indicates that he likes to go to schedule or [employer] and I discouraged him from eating at fast food places. I explained to him the superiority of the eating complex carbs as opposed to white bread and simple carbs.   I answered all his questions today and the patient was in agreement with the above outlined plan. I would like to see the patient back in 2 months, sooner if the need arises and encouraged him to call with any interim questions, concerns, problems or updates.

## 2014-01-03 ENCOUNTER — Encounter: Payer: Self-pay | Admitting: Neurology

## 2014-01-05 ENCOUNTER — Other Ambulatory Visit: Payer: Self-pay | Admitting: Internal Medicine

## 2014-01-07 ENCOUNTER — Other Ambulatory Visit: Payer: Self-pay | Admitting: Internal Medicine

## 2014-02-07 ENCOUNTER — Other Ambulatory Visit: Payer: Self-pay | Admitting: Family

## 2014-02-08 ENCOUNTER — Ambulatory Visit (INDEPENDENT_AMBULATORY_CARE_PROVIDER_SITE_OTHER): Payer: BC Managed Care – PPO | Admitting: Family

## 2014-02-08 ENCOUNTER — Encounter: Payer: Self-pay | Admitting: Family

## 2014-02-08 VITALS — BP 148/100 | HR 73 | Wt 236.0 lb

## 2014-02-08 DIAGNOSIS — E7800 Pure hypercholesterolemia, unspecified: Secondary | ICD-10-CM

## 2014-02-08 DIAGNOSIS — I10 Essential (primary) hypertension: Secondary | ICD-10-CM

## 2014-02-08 DIAGNOSIS — Z8673 Personal history of transient ischemic attack (TIA), and cerebral infarction without residual deficits: Secondary | ICD-10-CM

## 2014-02-08 MED ORDER — OLMESARTAN MEDOXOMIL-HCTZ 40-12.5 MG PO TABS: 1.0000 | ORAL_TABLET | Freq: Every day | ORAL | Status: DC

## 2014-02-08 NOTE — Progress Notes (Signed)
Subjective:    Patient ID: Jared [name], male    DOB: 1946/12/01, 68 y.o.   MRN: [id number]  Hypertension   68 y.o. Black male who presents today with chief complaint of "high blood pressure". Pt has a strong history of hypertension, also history of CVA. Pt states that his blood pressure has been "180's/100-105 the past few days". Pt states that he takes his blood pressure medications and prescribed every day. He acknowledges feeling stressed, not eating a low sodium diet and not exercising regularly. Pt denies headache, fever, chills and dizziness.     Review of Systems  Constitutional: Negative.   HENT: Negative.   Eyes: Negative.   Respiratory: Negative.   Cardiovascular:       Acknowledges having high blood pressure. [id number] systolic and [id number] diastolic   Gastrointestinal: Negative.   Endocrine: Negative.   Genitourinary: Negative.   Musculoskeletal: Negative.   Allergic/Immunologic: Negative.   Neurological: Positive for light-headedness.  Hematological: Negative.   Psychiatric/Behavioral: Negative.    Past Medical History  Diagnosis Date  . 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  . GOUT 07/26/2007  . HYPERCHOLESTEROLEMIA, BORDERLINE 01/16/2010  . HYPERTENSION 07/26/2007  . OSTEOARTHRITIS 09/28/2007  . PLANTAR FASCIITIS, BILATERAL 04/19/2010  . PROSTATITIS, RECURRENT 11/08/2009  . RENAL INSUFFICIENCY 07/26/2007  . 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 URINARY FREQUENCY, CHRONIC 03/10/2008  . Stroke 01/21/2013    History   Social History  . Marital Status: Married    Spouse Name: N/A    Number of Children: N/A  . Years of Education: N/A   Occupational History  . Not on file.   Social History Main Topics  . Smoking status: Never Smoker   . Smokeless tobacco: Never Used  . Alcohol Use: No     Comment: none  . Drug Use: No  . Sexual Activity: No   Other Topics Concern  . Not on file   Social History Narrative  . No narrative on  file    Past Surgical History  Procedure Laterality Date  . Rt arm surgery for gout      Family History  Problem Relation Age of Onset  . Heart disease Mother   . Heart disease Father   . CVA Father   . Diabetes Sister     Allergies  Allergen Reactions  . Other     Strawberries Cant breath    Current Outpatient Prescriptions on File Prior to Visit  Medication Sig Dispense Refill  . amLODipine (NORVASC) 10 MG tablet TAKE 1 TABLET BY MOUTH EVERY DAY  90 tablet  3  . aspirin EC 325 MG tablet Take 1 tablet (325 mg total) by mouth daily.    0  . CIALIS 5 MG tablet TAKE 1 TABLET BY MOUTH EVERY DAY  30 tablet  6  . diazepam (VALIUM) 5 MG tablet TAKE 1 TABLET BY MOUTH EVERY DAY AS NEEDED  30 tablet  3  . doxazosin (CARDURA) 1 MG tablet Take 1 tablet (1 mg total) by mouth at bedtime.  30 tablet  11  . Febuxostat (ULORIC) 80 MG TABS Take 80 mg by mouth daily.       . furosemide (LASIX) 20 MG tablet TAKE 1 TABLET (20 MG TOTAL) BY MOUTH DAILY.  30 tablet  5  . indomethacin (INDOCIN) 50 MG capsule TAKE ONE CAPSULE 3 TIMES A DAY  270 capsule  0  . labetalol (NORMODYNE) 300 MG tablet Take 1 tablet (300 mg total) by  mouth 2 (two) times daily.  60 tablet  11  . lisinopril (PRINIVIL,ZESTRIL) 40 MG tablet TAKE 1 TABLET BY MOUTH EVERY DAY  90 tablet  3  . lisinopril (PRINIVIL,ZESTRIL) 40 MG tablet TAKE 1 TABLET BY MOUTH EVERY DAY  90 tablet  3  . metFORMIN (GLUCOPHAGE) 500 MG tablet TAKE 1 TABLET BY MOUTH TWICE A DAY WITH A MEAL  180 tablet  3  . simvastatin (ZOCOR) 20 MG tablet TAKE 1 TABLET BY MOUTH EVERY DAY AT 6 PM  90 tablet  3   No current facility-administered medications on file prior to visit.    BP 148/100  Pulse 73  Wt 236 lb (107.049 kg)chart    Objective:   Physical Exam  Constitutional: He is oriented to person, place, and time. He appears well-developed and well-nourished. He is active.  HENT:  Head: Normocephalic.  Eyes: Conjunctivae, EOM and lids are normal. Pupils are  equal, round, and reactive to light.  Cardiovascular: Normal rate, regular rhythm, normal heart sounds and normal pulses.   Pulmonary/Chest: Effort normal and breath sounds normal.  Abdominal: Soft. Normal appearance and bowel sounds are normal.  Neurological: He is alert and oriented to person, place, and time.  Skin: Skin is warm, dry and intact.  Psychiatric: He has a normal mood and affect. His speech is normal and behavior is normal. Thought content normal.          Assessment & Plan:  68 y.o. Black male who presents today with chief complaint of "high blood pressure".  - Hypertension: Pt currently taking a wide range of blood pressure medication.   - Start Benicar/HCT daily.   - Discontinue AVAPRO  Education: Adhere to low sodium diet, limiting added salts and avoiding foods high in sodium.   - Exercise daily   - Reduce stress.  Follow up: 2 weeks for blood pressure evaluation and medication evaluation.

## 2014-02-08 NOTE — Patient Instructions (Signed)
Sodium-Controlled Diet Sodium is a mineral. It is found in many foods. Sodium may be found naturally or added during the making of a food. The most common form of sodium is salt, which is made up of sodium and chloride. Reducing your sodium intake involves changing your eating habits. The following guidelines will help you reduce the sodium in your diet:  Stop using the salt shaker.  Use salt sparingly in cooking and baking.  Substitute with sodium-free seasonings and spices.  Do not use a salt substitute (potassium chloride) without your caregiver's permission.  Include a variety of fresh, unprocessed foods in your diet.  Limit the use of processed and convenience foods that are high in sodium. USE THE FOLLOWING FOODS SPARINGLY: Breads/Starches  Commercial bread stuffing, commercial pancake or waffle mixes, coating mixes. Waffles. Croutons. Prepared (boxed or frozen) potato, rice, or noodle mixes that contain salt or sodium. Salted [place] fries or hash browns. Salted popcorn, breads, crackers, chips, or snack foods. Vegetables  Vegetables canned with salt or prepared in cream, butter, or cheese sauces. Sauerkraut. Tomato or vegetable juices canned with salt.  Fresh vegetables are allowed if rinsed thoroughly. Fruit  Fruit is okay to eat. Meat and Meat Substitutes  Salted or smoked meats, such as bacon or Canadian bacon, chipped or corned beef, hot dogs, salt pork, luncheon meats, pastrami, ham, or sausage. Canned or smoked fish, poultry, or meat. Processed cheese or cheese spreads, blue or [place]ef[place] cheese. Battered or frozen fish products. Prepared spaghetti sauce. Baked beans. Reuben sandwiches. Salted nuts. Caviar. Milk  Limit buttermilk to 1 cup per week. Soups and Combination Foods  Bouillon cubes, canned or dried soups, broth, consomm. Convenience (frozen or packaged) dinners with more than 600 mg sodium. Pot pies, pizza, Asian food, fast food cheeseburgers, and specialty  sandwiches. Desserts and Sweets  Regular (salted) desserts, pie, commercial fruit snack pies, commercial snack cakes, canned puddings.  Eat desserts and sweets in moderation. Fats and Oils  Gravy mixes or canned gravy. No more than 1 to 2 tbs of salad dressing. Chip dips.  Eat fats and oils in moderation. Beverages  See those listed under the vegetables and milk groups. Condiments  Ketchup, mustard, meat sauces, salsa, regular (salted) and lite soy sauce or mustard. Dill pickles, olives, meat tenderizer. Prepared horseradish or pickle relish. Dutch-processed cocoa. Baking powder or baking soda used medicinally. Worcestershire sauce. "Light" salt. Salt substitute, unless approved by your caregiver. Document Released: 05/30/2002 Document Revised: 03/01/2012 Document Reviewed: 12/31/2009 [facility] Patient Information 2014 [place], [place]. Hypertension As your heart beats, it forces blood through your arteries. This force is your blood pressure. If the pressure is too high, it is called hypertension (HTN) or high blood pressure. HTN is dangerous because you may have it and not know it. High blood pressure may mean that your heart has to work harder to pump blood. Your arteries may be narrow or stiff. The extra work puts you at risk for heart disease, stroke, and other problems.  Blood pressure consists of two numbers, a higher number over a lower, 110/72, for example. It is stated as "110 over 72." The ideal is below 120 for the top number (systolic) and under 80 for the bottom (diastolic). Write down your blood pressure today. You should pay close attention to your blood pressure if you have certain conditions such as:  Heart failure.  Prior heart attack.  Diabetes  Chronic kidney disease.  Prior stroke.  Multiple risk factors for heart disease. To see  if you have HTN, your blood pressure should be measured while you are seated with your arm held at the level of the heart. It should be  measured at least twice. A one-time elevated blood pressure reading (especially in the Emergency Department) does not mean that you need treatment. There may be conditions in which the blood pressure is different between your right and left arms. It is important to see your caregiver soon for a recheck. Most people have essential hypertension which means that there is not a specific cause. This type of high blood pressure may be lowered by changing lifestyle factors such as:  Stress.  Smoking.  Lack of exercise.  Excessive weight.  Drug/tobacco/alcohol use.  Eating less salt. Most people do not have symptoms from high blood pressure until it has caused damage to the body. Effective treatment can often prevent, delay or reduce that damage. TREATMENT  When a cause has been identified, treatment for high blood pressure is directed at the cause. There are a large number of medications to treat HTN. These fall into several categories, and your caregiver will help you select the medicines that are best for you. Medications may have side effects. You should review side effects with your caregiver. If your blood pressure stays high after you have made lifestyle changes or started on medicines,   Your medication(s) may need to be changed.  Other problems may need to be addressed.  Be certain you understand your prescriptions, and know how and when to take your medicine.  Be sure to follow up with your caregiver within the time frame advised (usually within two weeks) to have your blood pressure rechecked and to review your medications.  If you are taking more than one medicine to lower your blood pressure, make sure you know how and at what times they should be taken. Taking two medicines at the same time can result in blood pressure that is too low. SEEK IMMEDIATE MEDICAL CARE IF:  You develop a severe headache, blurred or changing vision, or confusion.  You have unusual weakness or numbness,  or a faint feeling.  You have severe chest or abdominal pain, vomiting, or breathing problems. MAKE SURE YOU:   Understand these instructions.  Will watch your condition.  Will get help right away if you are not doing well or get worse. Document Released: 12/08/2005 Document Revised: 03/01/2012 Document Reviewed: 07/28/2008 [facility] Patient Information 2014 [place].

## 2014-02-08 NOTE — Progress Notes (Signed)
Pre visit review using our clinic review tool, if applicable. No additional management support is needed unless otherwise documented below in the visit note. 

## 2014-02-09 ENCOUNTER — Telehealth: Payer: Self-pay | Admitting: Internal Medicine

## 2014-02-09 ENCOUNTER — Ambulatory Visit: Payer: BC Managed Care – PPO | Admitting: Family Medicine

## 2014-02-09 NOTE — Telephone Encounter (Signed)
Relevant patient education assigned to patient using Emmi. ° °

## 2014-02-10 ENCOUNTER — Ambulatory Visit: Payer: BC Managed Care – PPO | Admitting: Internal Medicine

## 2014-02-24 ENCOUNTER — Encounter: Payer: Self-pay | Admitting: *Deleted

## 2014-02-27 ENCOUNTER — Ambulatory Visit: Payer: BC Managed Care – PPO | Admitting: Family

## 2014-03-01 ENCOUNTER — Encounter: Payer: Self-pay | Admitting: Family

## 2014-03-01 ENCOUNTER — Ambulatory Visit (INDEPENDENT_AMBULATORY_CARE_PROVIDER_SITE_OTHER): Payer: BC Managed Care – PPO | Admitting: Family

## 2014-03-01 VITALS — BP 130/78 | Temp 98.5°F | Wt 236.0 lb

## 2014-03-01 DIAGNOSIS — N529 Male erectile dysfunction, unspecified: Secondary | ICD-10-CM

## 2014-03-01 DIAGNOSIS — I10 Essential (primary) hypertension: Secondary | ICD-10-CM

## 2014-03-01 DIAGNOSIS — M109 Gout, unspecified: Secondary | ICD-10-CM

## 2014-03-01 MED ORDER — ALLOPURINOL 100 MG PO TABS: 100.0000 mg | ORAL_TABLET | Freq: Every day | ORAL | Status: DC

## 2014-03-01 MED ORDER — TADALAFIL 20 MG PO TABS: 20.0000 mg | ORAL_TABLET | Freq: Every day | ORAL | Status: DC | PRN

## 2014-03-01 NOTE — Progress Notes (Signed)
Subjective:    Patient ID: Jared [name], male    DOB: 04-02-1946, 68 y.o.   MRN: [id number]  HPI 68 year old African American male, nonsmoker therefore recheck of uncontrolled hypertension. At his last office visit we started Benicar HCT 40/12.5 mg once daily. Tolerating the medication well. He had been on Uloric gout controlled but is unable to afford it. Wished to resume allopurinol. Also requested a prescription for Cialis 20 mg in lieu of Cialis 5 mg once daily.   Review of Systems  Constitutional: Negative.   HENT: Negative.   Respiratory: Negative.   Cardiovascular: Negative.   Gastrointestinal: Negative.   Endocrine: Negative.   Genitourinary: Negative.   Musculoskeletal: Negative.   Skin: Negative.   Neurological: Negative.   Hematological: Negative.   Psychiatric/Behavioral: Negative.    Past Medical History  Diagnosis Date  . 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  . GOUT 07/26/2007  . HYPERCHOLESTEROLEMIA, BORDERLINE 01/16/2010  . HYPERTENSION 07/26/2007  . OSTEOARTHRITIS 09/28/2007  . PLANTAR FASCIITIS, BILATERAL 04/19/2010  . PROSTATITIS, RECURRENT 11/08/2009  . RENAL INSUFFICIENCY 07/26/2007  . 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 URINARY FREQUENCY, CHRONIC 03/10/2008  . Stroke 01/21/2013    History   Social History  . Marital Status: Married    Spouse Name: N/A    Number of Children: N/A  . Years of Education: N/A   Occupational History  . Not on file.   Social History Main Topics  . Smoking status: Never Smoker   . Smokeless tobacco: Never Used  . Alcohol Use: No     Comment: none  . Drug Use: No  . Sexual Activity: No   Other Topics Concern  . Not on file   Social History Narrative  . No narrative on file    Past Surgical History  Procedure Laterality Date  . Rt arm surgery for gout      Family History  Problem Relation Age of Onset  . Heart disease Mother   . Heart disease Father   . CVA Father   .  Diabetes Sister     Allergies  Allergen Reactions  . Other     Strawberries Cant breath    Current Outpatient Prescriptions on File Prior to Visit  Medication Sig Dispense Refill  . amLODipine (NORVASC) 10 MG tablet TAKE 1 TABLET BY MOUTH EVERY DAY  90 tablet  3  . aspirin EC 325 MG tablet Take 1 tablet (325 mg total) by mouth daily.    0  . diazepam (VALIUM) 5 MG tablet TAKE 1 TABLET BY MOUTH EVERY DAY AS NEEDED  30 tablet  3  . doxazosin (CARDURA) 1 MG tablet Take 1 tablet (1 mg total) by mouth at bedtime.  30 tablet  11  . furosemide (LASIX) 20 MG tablet TAKE 1 TABLET (20 MG TOTAL) BY MOUTH DAILY.  30 tablet  5  . indomethacin (INDOCIN) 50 MG capsule TAKE ONE CAPSULE 3 TIMES A DAY  270 capsule  0  . labetalol (NORMODYNE) 300 MG tablet Take 1 tablet (300 mg total) by mouth 2 (two) times daily.  60 tablet  11  . metFORMIN (GLUCOPHAGE) 500 MG tablet TAKE 1 TABLET BY MOUTH TWICE A DAY WITH A MEAL  180 tablet  3  . olmesartan-hydrochlorothiazide (BENICAR HCT) 40-12.5 MG per tablet Take 1 tablet by mouth daily.  21 tablet  0  . simvastatin (ZOCOR) 20 MG tablet TAKE 1 TABLET BY MOUTH EVERY DAY AT 6 PM  90  tablet  3   No current facility-administered medications on file prior to visit.    BP 130/78  Temp(Src) 98.5 F (36.9 C) (Oral)  Wt 236 lb (107.049 kg)chart    Objective:   Physical Exam  Constitutional: He is oriented to person, place, and time. He appears well-developed and well-nourished.  HENT:  Right Ear: External ear normal.  Left Ear: External ear normal.  Nose: Nose normal.  Mouth/Throat: Oropharynx is clear and moist.  Neck: Normal range of motion. Neck supple.  Cardiovascular: Normal rate, regular rhythm and normal heart sounds.   Pulmonary/Chest: Effort normal and breath sounds normal.  Abdominal: Soft. Bowel sounds are normal.  Musculoskeletal: Normal range of motion.  Neurological: He is alert and oriented to person, place, and time.  Skin: Skin is warm and  dry.  Psychiatric: He has a normal mood and affect.          Assessment & Plan:  Jared [name] was seen today for follow-up.  Diagnoses and associated orders for this visit:  Erectile dysfunction  Unspecified essential hypertension  Gout  Other Orders - tadalafil (CIALIS) 20 MG tablet; Take 1 tablet (20 mg total) by mouth daily as needed for erectile dysfunction. - allopurinol (ZYLOPRIM) 100 MG tablet; Take 1 tablet (100 mg total) by mouth daily.   Call the office with any questions or concerns. Recheck as scheduled, and as needed.

## 2014-03-01 NOTE — Progress Notes (Signed)
Pre visit review using our clinic review tool, if applicable. No additional management support is needed unless otherwise documented below in the visit note. 

## 2014-03-01 NOTE — Patient Instructions (Signed)

## 2014-03-13 ENCOUNTER — Telehealth: Payer: Self-pay | Admitting: Internal Medicine

## 2014-03-13 MED ORDER — OLMESARTAN MEDOXOMIL-HCTZ 40-12.5 MG PO TABS
1.0000 | ORAL_TABLET | Freq: Every day | ORAL | Status: DC
Start: 2014-03-13 — End: 2014-06-19

## 2014-03-13 NOTE — Telephone Encounter (Signed)
Pt states he was to have rx olmesartan-hydrochlorothiazide (BENICAR HCT) 40-12.5 MG per tablet Called into [facility]/ [address], but they have not received. Do you want pt to contine to take? pls advise?

## 2014-03-13 NOTE — Telephone Encounter (Signed)
Rx sent to pharmacy   

## 2014-03-29 ENCOUNTER — Telehealth: Payer: Self-pay | Admitting: Internal Medicine

## 2014-03-29 NOTE — Telephone Encounter (Signed)
Pt no longer taking.  It was changed to Benicar.  Pharmacy aware

## 2014-03-29 NOTE — Telephone Encounter (Signed)
[id number] - [place], [place] - [place] is requesting re-fill on irbesartan (AVAPRO) 300 MG tablet

## 2014-05-03 ENCOUNTER — Other Ambulatory Visit: Payer: Self-pay | Admitting: Internal Medicine

## 2014-05-18 ENCOUNTER — Encounter: Payer: Self-pay | Admitting: Neurology

## 2014-05-18 ENCOUNTER — Other Ambulatory Visit: Payer: Self-pay | Admitting: Internal Medicine

## 2014-05-18 ENCOUNTER — Ambulatory Visit (INDEPENDENT_AMBULATORY_CARE_PROVIDER_SITE_OTHER): Payer: BC Managed Care – PPO | Admitting: Neurology

## 2014-05-18 VITALS — BP 124/77 | HR 71 | Temp 97.8°F | Ht 68.5 in | Wt 232.0 lb

## 2014-05-18 DIAGNOSIS — Z9989 Dependence on other enabling machines and devices: Principal | ICD-10-CM

## 2014-05-18 DIAGNOSIS — I639 Cerebral infarction, unspecified: Secondary | ICD-10-CM

## 2014-05-18 DIAGNOSIS — H269 Unspecified cataract: Secondary | ICD-10-CM

## 2014-05-18 DIAGNOSIS — G4733 Obstructive sleep apnea (adult) (pediatric): Secondary | ICD-10-CM

## 2014-05-18 DIAGNOSIS — I6350 Cerebral infarction due to unspecified occlusion or stenosis of unspecified cerebral artery: Secondary | ICD-10-CM

## 2014-05-18 DIAGNOSIS — E669 Obesity, unspecified: Secondary | ICD-10-CM

## 2014-05-18 NOTE — Progress Notes (Signed)
Subjective:    Patient ID: Jared [name] is a 68 y.o. male.  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 He is unaccompanied today. I last saw him on 01/02/2014, at which time we talked about his sleep study results and his compliance data. He reported sleeping much better with CPAP and being able to tolerate the mask and pressure. He indicated that her compliance than his compliance data suggested. He was encouraged to eat healthy. We talked about better food choices.   Today, we could not get a compliance download from his card. He has been using CPAP every night. He continues to feel that it is helpful. He had an episode of lightheadedness and dizziness today. He is somewhat sensitive to the heat. He is trying to stay well hydrated. He did change positions suddenly when he felt this way. Otherwise he is still struggling with his weight. He is trying to lose weight and would like to go to the gym but has not really gotten around to doing that. We got in touch with his home health company. They are sending a new bone him for him for pick up from here. This may help [place] get his compliance data remotely. He does endorse full compliance and also called his wife who endorses that he uses his CPAP every night.  I first met him on 10/05/2013, at which time we discussed his split-night sleep study at [facility] form 03/08/2013: His total AHI was 82.2 per hour. His O2 nadir was 77%. His sleep efficiency was 88.3%. He had an increased percentage of stage II sleep, absence of deep sleep and REM sleep. He was titrated on CPAP, but had ongoing sleep disordered breathing. His residual AHI ranged from 14-85 per hour. He was  titrated on CPAP from 4 cm to 14 cm and then switched to BiPAP on which he did not do well. He was titrated on 15/12-19/16 cm and his with AHI ranged from 60-85 per hour. He was placed on auto bipap of up to 19/16 cm, but c/o residual EDS, non-restorative sleep, difficulty tolerating the pressures and too much pressure. There was no compliance data available at the time. I suggested that he return for a sleep study and he had a split-night sleep study on 10/18/2013. I went over his test results with him in detail today. His baseline sleep efficiency was reduced severely at 45.7% with a latency to sleep of 47.5 minutes and the wake after sleep onset of only 3.5 minutes. His baseline arousal index was markedly elevated at 41.9 arousals per hour due primarily to apneas and hypopneas. He had an increased percentage of light stage sleep, absence of slow-wave sleep and absence of REM sleep prior to CPAP initiation. His AHI was 37.7 per hour. His baseline oxygen saturation was 93%, his nadir was 82%. He was then titrated on CPAP from 5-11 cm of water pressure and had a reduction of his AHI 2.7 events per hour at the final pressure. Supine REM sleep was achieved. His average oxygen saturation post CPAP was 94%, his nadir was 84%. Mild periodic leg movements were noted after CPAP initiation with minimal arousals noted.  I reviewed his compliance data from  11/16/2013 to 12/22/2013 (37 days), during which time he was CPAP only 22 nights. His percent used days greater than 4 hours was only 32%, indicating poor compliance. His pressure was 11 cm with EPR of 2. Residual AHI was low at 0.9 per hour and his leak has also been low. His average usage is 2 hours and 31 minutes for all nights.  I reviewed his compliance data from 12/03/2013 to 01/01/2014 (30 days), during which time he used CPAP on 24 nights. His percent used days greater than 4 hours was only 47%, and while this is improved from the beginning, it is still only  mediocre compliance. His average usage for all days was 3 hours and 36 minutes, he is residual AHI was 0.9 per hour, indicating an appropriate pressure setting. His leak was low.   His Past Medical History Is Significant For: Past Medical History  Diagnosis Date  . 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  . GOUT 07/26/2007  . HYPERCHOLESTEROLEMIA, BORDERLINE 01/16/2010  . HYPERTENSION 07/26/2007  . OSTEOARTHRITIS 09/28/2007  . PLANTAR FASCIITIS, BILATERAL 04/19/2010  . PROSTATITIS, RECURRENT 11/08/2009  . RENAL INSUFFICIENCY 07/26/2007  . 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 URINARY FREQUENCY, CHRONIC 03/10/2008  . Stroke 01/21/2013    His Past Surgical History Is Significant For: Past Surgical History  Procedure Laterality Date  . Rt arm surgery for gout      His Family History Is Significant For: Family History  Problem Relation Age of Onset  . Heart disease Mother   . Heart disease Father   . CVA Father   . Diabetes Sister     His Social History Is Significant For: History   Social History  . Marital Status: Married    Spouse Name: Stanton Kidney    Number of Children: 5  . Years of Education: 12   Occupational History  . TESTOR     retired   Social History Main Topics  . Smoking status: Never Smoker   . Smokeless tobacco: Never Used  . Alcohol Use: No     Comment: none  . Drug Use: No  . Sexual Activity: No   Other Topics Concern  . None   Social History Narrative   Patient is right handed, resides in home with wife    His Allergies Are:  Allergies  Allergen Reactions  . Other     Strawberries Cant breath  :   His Current Medications Are:  Outpatient Encounter Prescriptions as of 05/18/2014  Medication Sig  . allopurinol (ZYLOPRIM) 100 MG tablet Take 1 tablet (100 mg total) by mouth daily.  Marland Kitchen amLODipine (NORVASC) 10 MG tablet TAKE 1 TABLET BY MOUTH EVERY DAY  . aspirin EC 325 MG tablet Take 1 tablet (325 mg total) by mouth daily.  .  diazepam (VALIUM) 5 MG tablet TAKE 1 TABLET BY MOUTH EVERY DAY AS NEEDED  . doxazosin (CARDURA) 1 MG tablet Take 1 tablet (1 mg total) by mouth at bedtime.  . furosemide (LASIX) 20 MG tablet TAKE 1 TABLET (20 MG TOTAL) BY MOUTH DAILY.  . indomethacin (INDOCIN) 50 MG capsule TAKE ONE CAPSULE BY MOUTH 3 TIMES A DAY  . labetalol (NORMODYNE) 300 MG tablet TAKE 1 TABLET BY MOUTH TWICE A DAY  . metFORMIN (GLUCOPHAGE) 500 MG tablet TAKE 1 TABLET BY MOUTH TWICE A DAY WITH A MEAL  . olmesartan-hydrochlorothiazide (BENICAR HCT) 40-12.5 MG per tablet Take 1 tablet by mouth daily.  . simvastatin (ZOCOR) 20 MG tablet TAKE  1 TABLET BY MOUTH EVERY DAY AT 6 PM  . tadalafil (CIALIS) 20 MG tablet Take 1 tablet (20 mg total) by mouth daily as needed for erectile dysfunction.  :  Review of Systems:  Out of a complete 14 point review of systems, all are reviewed and negative with the exception of these symptoms as listed below:  Review of Systems  HENT: Positive for hearing loss.        Runny nose  Musculoskeletal: Positive for gait problem and joint swelling.  Neurological: Positive for dizziness, speech difficulty and weakness.       Daytime sleepiness  Psychiatric/Behavioral: Positive for behavioral problems and confusion.    Objective:  Neurologic Exam  Physical Exam Physical Examination:   Filed Vitals:   05/18/14 1508  BP: 124/77  Pulse: 71  Temp: 97.8 F (36.6 C)    General Examination: The patient is a very pleasant 68 y.o. male in no acute distress. He appears well-developed and well-nourished and well groomed. He is obese.  HEENT: Normocephalic, atraumatic, pupils are equal, round and reactive to light and accommodation. Funduscopic exam is normal with sharp disc margins noted. He has mild bilateral cataracts. Extraocular tracking is good without limitation to gaze excursion or nystagmus noted. Normal smooth pursuit is noted. Hearing is grossly intact. Face is symmetric with normal facial  animation and normal facial sensation. Speech is clear with no dysarthria noted. There is no hypophonia. There is no lip, neck/head, jaw or voice tremor. Neck is supple with full range of passive and active motion. There are no carotid bruits on auscultation. Oropharynx exam reveals: mild mouth dryness, adequate dental hygiene and marked airway crowding, due to thick soft palate, large uvula, large tongue. Mallampati is class III. Tongue protrudes centrally and palate elevates symmetrically. size/absent. Neck size is 18 ''. Nasal inspection reveals no significant sore spots in his nasal passages.  Chest: Clear to auscultation without wheezing, rhonchi or crackles noted.  Heart: S1+S2+0, regular and normal without murmurs, rubs or gallops noted.   Abdomen: Soft, non-tender and non-distended with normal bowel sounds appreciated on auscultation.  Extremities: There is no pitting edema in the distal lower extremities bilaterally. Pedal pulses are intact.  Skin: Warm and dry without trophic changes noted. There are no varicose veins.  Musculoskeletal: exam reveals no obvious joint deformities, tenderness or joint swelling or erythema.   Neurologically:  Mental status: The patient is awake, alert and oriented in all 4 spheres. His memory, attention, language and knowledge are appropriate. There is no aphasia, agnosia, apraxia or anomia. Speech is clear with normal prosody and enunciation. Thought process is linear. Mood is congruent and affect is normal.  Cranial nerves are as described above under HEENT exam. In addition, shoulder shrug is normal with equal shoulder height noted. Motor exam: Normal bulk, strength and tone is noted. There is no drift, tremor or rebound. Romberg is negative. Reflexes are 2+ throughout. Toes are downgoing bilaterally. Fine motor skills are intact with normal finger taps, normal hand movements, normal rapid alternating patting, normal foot taps and normal foot agility, with  the exception of mild difficulty with foot taps on the L, unchanged and stable.   Cerebellar testing shows no dysmetria or intention tremor on finger to nose testing. There is no truncal or gait ataxia.  Sensory exam is intact to light touch, pinprick, vibration, temperature sense in the upper and lower extremities.  Gait, station and balance are unremarkable. No veering to one side is noted. No leaning  to one side is noted. Posture is age-appropriate and stance is narrow based. No problems turning are noted. He turns en bloc. Tandem walk is difficult for him. Intact toe and heel stance is noted.                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 He is established on CPAP therapy at a treatment pressure of 11 cm with EPR of 2. He is compliant with treatment. Last compliance data was from January and we are trying to get an updated download from his machine. He is to pick up any motor him from our sleep lab today. We are going to try to get a download remotely. He does endorse full compliance and his wife endorses this over the phone as well. He has no new complaints. He does have some residual lightheadedness and balance problems sometimes and is advised to stay well-hydrated and change positions slowly and not abruptly. He is advised about weight loss encouraged to use his CPAP machine faithfully. He continues to endorse improved sleep and a better experience with CPAP as opposed to when he was on auto BiPAP.   I encouraged the patient to eat healthy, exercise daily and keep well hydrated, to keep a scheduled  bedtime and wake time routine, to not skip any meals and eat healthy snacks in between meals and to have protein with every meal. I stressed the importance of regular exercise. I answered all his questions today and the patient was in agreement with the above outlined plan. I would like to see the patient back in 6 months, sooner if the need arises and encouraged him to call with any interim questions, concerns, problems or updates. I will be on the lookout for an updated compliance download from his machine. He is encouraged to see an eye doctor. He states that he has not seen an eye doctor in over 3 years. He may have mild bilateral cataracts.

## 2014-05-18 NOTE — Patient Instructions (Addendum)
Please continue using your CPAP regularly. While your insurance requires that you use CPAP at least 4 hours each night on 70% of the nights, I recommend, that you not skip any nights and use it throughout the night if you can. Getting used to CPAP and staying with the treatment long term does take time and patience and discipline. Untreated obstructive sleep apnea when it is moderate to severe can have an adverse impact on cardiovascular health and raise her risk for heart disease, arrhythmias, hypertension, congestive heart failure, stroke and diabetes. Untreated obstructive sleep apnea causes sleep disruption, nonrestorative sleep, and sleep deprivation. This can have an impact on your day to day functioning and cause daytime sleepiness and impairment of cognitive function, memory loss, mood disturbance, and problems focussing. Using CPAP regularly can improve these symptoms.  Please pick up your new Mod[employer] for your CPAP machine from the sleep lab today and plug it into your machine at home, we should be able to get a download remotely.   Keep yourself well hydrated and change positions slowly.

## 2014-05-23 ENCOUNTER — Other Ambulatory Visit: Payer: Self-pay | Admitting: Internal Medicine

## 2014-06-02 ENCOUNTER — Ambulatory Visit: Payer: BC Managed Care – PPO | Admitting: Family

## 2014-06-05 LAB — HM DIABETES EYE EXAM

## 2014-06-16 ENCOUNTER — Ambulatory Visit: Payer: BC Managed Care – PPO | Admitting: Family

## 2014-06-16 DIAGNOSIS — Z0289 Encounter for other administrative examinations: Secondary | ICD-10-CM

## 2014-06-19 ENCOUNTER — Other Ambulatory Visit: Payer: Self-pay | Admitting: Family

## 2014-06-26 ENCOUNTER — Encounter: Payer: Self-pay | Admitting: Internal Medicine

## 2014-06-27 ENCOUNTER — Ambulatory Visit (INDEPENDENT_AMBULATORY_CARE_PROVIDER_SITE_OTHER): Payer: BC Managed Care – PPO | Admitting: Family

## 2014-06-27 ENCOUNTER — Encounter: Payer: Self-pay | Admitting: Family

## 2014-06-27 VITALS — BP 130/88 | HR 62 | Temp 99.2°F | Ht 68.5 in | Wt 231.0 lb

## 2014-06-27 DIAGNOSIS — N529 Male erectile dysfunction, unspecified: Secondary | ICD-10-CM

## 2014-06-27 DIAGNOSIS — E119 Type 2 diabetes mellitus without complications: Secondary | ICD-10-CM

## 2014-06-27 DIAGNOSIS — E785 Hyperlipidemia, unspecified: Secondary | ICD-10-CM

## 2014-06-27 DIAGNOSIS — I10 Essential (primary) hypertension: Secondary | ICD-10-CM

## 2014-06-27 LAB — COMPREHENSIVE METABOLIC PANEL
ALT: 25 U/L (ref 0–53)
AST: 25 U/L (ref 0–37)
Albumin: 4.2 g/dL (ref 3.5–5.2)
Alkaline Phosphatase: 77 U/L (ref 39–117)
BILIRUBIN TOTAL: 0.5 mg/dL (ref 0.2–1.2)
BUN: 21 mg/dL (ref 6–23)
CALCIUM: 9.6 mg/dL (ref 8.4–10.5)
CO2: 26 meq/L (ref 19–32)
CREATININE: 2.1 mg/dL — AB (ref 0.4–1.5)
Chloride: 102 mEq/L (ref 96–112)
GFR: 41.56 mL/min — AB (ref >60.00)
Glucose, Bld: 104 mg/dL — ABNORMAL HIGH (ref 70–99)
Potassium: 3.9 mEq/L (ref 3.5–5.1)
Sodium: 137 mEq/L (ref 135–145)
Total Protein: 7.6 g/dL (ref 6.0–8.3)

## 2014-06-27 LAB — MICROALBUMIN / CREATININE URINE RATIO
CREATININE, U: 77.2 mg/dL
MICROALB UR: 0.8 mg/dL (ref 0.0–1.9)
Microalb Creat Ratio: 1 mg/g (ref 0.0–30.0)

## 2014-06-27 LAB — HEMOGLOBIN A1C: HEMOGLOBIN A1C: 5.9 % (ref 4.6–6.5)

## 2014-06-27 MED ORDER — TADALAFIL 5 MG PO TABS: 5.0000 mg | ORAL_TABLET | Freq: Every day | ORAL | Status: DC | PRN

## 2014-06-27 NOTE — Progress Notes (Signed)
Subjective:    Patient ID: Jared [name], male    DOB: 09-06-1946, 68 y.o.   MRN: [id number]  HPI 68 y.o. AA male presents today for follow up for diabetes and hypertension. Pt currently takes metformin for diabetes; he takes Benicar HCT, amlodipine and lasix for HTN. Pt reports that he does not monitor his blood sugars, but he does monitor his blood pressures. Pt states blood pressures usually are between 130/60 and 150/80. Denies headache, dizziness, polyuria, polydipsia and swelling to extremities. Pt denies fever, fatigue, chills, SOB and change in appetite.     Review of Systems  Constitutional: Negative.   HENT: Negative.   Eyes: Negative.   Respiratory: Negative.   Cardiovascular: Negative.   Gastrointestinal: Negative.   Endocrine: Negative.   Musculoskeletal: Negative.   Skin: Negative.   Allergic/Immunologic: Negative.   Neurological: Negative.   Hematological: Negative.   Psychiatric/Behavioral: Negative.    Past Medical History  Diagnosis Date  . 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  . GOUT 07/26/2007  . HYPERCHOLESTEROLEMIA, BORDERLINE 01/16/2010  . HYPERTENSION 07/26/2007  . OSTEOARTHRITIS 09/28/2007  . PLANTAR FASCIITIS, BILATERAL 04/19/2010  . PROSTATITIS, RECURRENT 11/08/2009  . RENAL INSUFFICIENCY 07/26/2007  . 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 URINARY FREQUENCY, CHRONIC 03/10/2008  . Stroke 01/21/2013    History   Social History  . Marital Status: Married    Spouse Name: Jared [name]    Number of Children: 5  . Years of Education: 12   Occupational History  . TESTOR     retired   Social History Main Topics  . Smoking status: Never Smoker   . Smokeless tobacco: Never Used  . Alcohol Use: No     Comment: none  . Drug Use: No  . Sexual Activity: No   Other Topics Concern  . Not on file   Social History Narrative   Patient is right handed, resides in home with wife    Past Surgical History  Procedure Laterality Date    . Rt arm surgery for gout      Family History  Problem Relation Age of Onset  . Heart disease Mother   . Heart disease Father   . CVA Father   . Diabetes Sister     Allergies  Allergen Reactions  . Other     Strawberries Cant breath    Current Outpatient Prescriptions on File Prior to Visit  Medication Sig Dispense Refill  . allopurinol (ZYLOPRIM) 100 MG tablet Take 1 tablet (100 mg total) by mouth daily.  30 tablet  6  . amLODipine (NORVASC) 10 MG tablet TAKE 1 TABLET BY MOUTH EVERY DAY  90 tablet  3  . aspirin EC 325 MG tablet Take 1 tablet (325 mg total) by mouth daily.    0  . BENICAR HCT 40-12.5 MG per tablet TAKE 1 TABLET BY MOUTH DAILY.  90 tablet  0  . diazepam (VALIUM) 5 MG tablet TAKE 1 TABLET BY MOUTH EVERY DAY AS NEEDED  30 tablet  3  . doxazosin (CARDURA) 1 MG tablet Take 1 tablet (1 mg total) by mouth at bedtime.  30 tablet  11  . furosemide (LASIX) 20 MG tablet TAKE 1 TABLET (20 MG TOTAL) BY MOUTH DAILY.  30 tablet  5  . indomethacin (INDOCIN) 50 MG capsule TAKE ONE CAPSULE BY MOUTH 3 TIMES A DAY  270 capsule  0  . labetalol (NORMODYNE) 300 MG tablet TAKE 1 TABLET BY MOUTH TWICE A DAY  60 tablet  1  . metFORMIN (GLUCOPHAGE) 500 MG tablet TAKE 1 TABLET BY MOUTH TWICE A DAY WITH A MEAL  180 tablet  3  . simvastatin (ZOCOR) 20 MG tablet TAKE 1 TABLET BY MOUTH EVERY DAY AT 6 PM  90 tablet  3  . tadalafil (CIALIS) 20 MG tablet Take 1 tablet (20 mg total) by mouth daily as needed for erectile dysfunction.  5 tablet  5   No current facility-administered medications on file prior to visit.    BP 130/88  Pulse 62  Temp(Src) 99.2 F (37.3 C) (Oral)  Ht 5' 8.5" (1.74 m)  Wt 231 lb (104.781 kg)  BMI 34.61 kg/m2chart    Objective:   Physical Exam  Constitutional: He appears well-developed and well-nourished. He is active.  Cardiovascular: Normal rate, regular rhythm, normal heart sounds and normal pulses.   Pulmonary/Chest: Effort normal and breath sounds normal.   Abdominal: Soft. Normal appearance and bowel sounds are normal.  Neurological: He is alert.  Skin: Skin is warm, dry and intact.  Psychiatric: He has a normal mood and affect. His speech is normal and behavior is normal. Thought content normal.          Assessment & Plan:  Jared [name] was seen today for follow-up.  Diagnoses and associated orders for this visit:  Unspecified essential hypertension - CMP - Microalbumin, urine  Other and unspecified hyperlipidemia - Microalbumin, urine  Type 2 diabetes mellitus without complication - Hemoglobin A1c - Microalbumin, urine  Erectile dysfunction, unspecified erectile dysfunction type  Other Orders - tadalafil (CIALIS) 5 MG tablet; Take 1 tablet (5 mg total) by mouth daily as needed for erectile dysfunction.   Education: Exercise 30 minutes per day at least 5x per week. Eat healthy diet low in cholesterol and sodium. Medication discussed in detail with patient.   Follow up: 4 months for physical

## 2014-06-27 NOTE — Patient Instructions (Signed)
Type 2 Diabetes Mellitus, Adult Type 2 diabetes mellitus, often simply referred to as type 2 diabetes, is a long-lasting (chronic) disease. In type 2 diabetes, the pancreas does not make enough insulin (a hormone), the cells are less responsive to the insulin that is made (insulin resistance), or both. Normally, insulin moves sugars from food into the tissue cells. The tissue cells use the sugars for energy. The lack of insulin or the lack of normal response to insulin causes excess sugars to build up in the blood instead of going into the tissue cells. As a result, high blood sugar (hyperglycemia) develops. The effect of high sugar (glucose) levels can cause many complications. Type 2 diabetes was also previously called adult-onset diabetes but it can occur at any age.  RISK FACTORS  A person is predisposed to developing type 2 diabetes if someone in the family has the disease and also has one or more of the following primary risk factors:  Overweight.  An inactive lifestyle.  A history of consistently eating high-calorie foods. Maintaining a normal weight and regular physical activity can reduce the chance of developing type 2 diabetes. SYMPTOMS  A person with type 2 diabetes may not show symptoms initially. The symptoms of type 2 diabetes appear slowly. The symptoms include:  Increased thirst (polydipsia).  Increased urination (polyuria).  Increased urination during the night (nocturia).  Weight loss. This weight loss may be rapid.  Frequent, recurring infections.  Tiredness (fatigue).  Weakness.  Vision changes, such as blurred vision.  Fruity smell to your breath.  Abdominal pain.  Nausea or vomiting.  Cuts or bruises which are slow to heal.  Tingling or numbness in the hands or feet. DIAGNOSIS Type 2 diabetes is frequently not diagnosed until complications of diabetes are present. Type 2 diabetes is diagnosed when symptoms or complications are present and when blood  glucose levels are increased. Your blood glucose level may be checked by one or more of the following blood tests:  A fasting blood glucose test. You will not be allowed to eat for at least 8 hours before a blood sample is taken.  A random blood glucose test. Your blood glucose is checked at any time of the day regardless of when you ate.  A hemoglobin A1c blood glucose test. A hemoglobin A1c test provides information about blood glucose control over the previous 3 months.  An oral glucose tolerance test (OGTT). Your blood glucose is measured after you have not eaten (fasted) for 2 hours and then after you drink a glucose-containing beverage. TREATMENT   You may need to take insulin or diabetes medicine daily to keep blood glucose levels in the desired range.  If you use insulin, you may need to adjust the dosage depending on the carbohydrates that you eat with each meal or snack. The treatment goal is to maintain the before meal blood sugar (preprandial glucose) level at 70-130 mg/dL. HOME CARE INSTRUCTIONS   Have your hemoglobin A1c level checked twice a year.  Perform daily blood glucose monitoring as directed by your health care provider.  Monitor urine ketones when you are ill and as directed by your health care provider.  Take your diabetes medicine or insulin as directed by your health care provider to maintain your blood glucose levels in the desired range.  Never run out of diabetes medicine or insulin. It is needed every day.  If you are using insulin, you may need to adjust the amount of insulin given based on your intake   of carbohydrates. Carbohydrates can raise blood glucose levels but need to be included in your diet. Carbohydrates provide vitamins, minerals, and fiber which are an essential part of a healthy diet. Carbohydrates are found in fruits, vegetables, whole grains, dairy products, legumes, and foods containing added sugars.  Eat healthy foods. You should make an  appointment to see a registered dietitian to help you create an eating plan that is right for you.  Lose weight if overweight.  Carry a medical alert card or wear your medical alert jewelry.  Carry a 15 gram carbohydrate snack with you at all times to treat low blood glucose (hypoglycemia). Some examples of 15 gram carbohydrate snacks include:  Glucose tablets, 3 or 4  Raisins, 2 tablespoons (24 grams)  Jelly beans, 6  Animal crackers, 8  Regular pop, 4 ounces (120 mL)  Gummy treats, 9  Recognize hypoglycemia. Hypoglycemia occurs with blood glucose levels of 70 mg/dL and below. The risk for hypoglycemia increases when fasting or skipping meals, during or after intense exercise, and during sleep. Hypoglycemia symptoms can include:  Tremors or shakes.  Decreased ability to concentrate.  Sweating.  Increased heart rate.  Headache.  Dry mouth.  Hunger.  Irritability.  Anxiety.  Restless sleep.  Altered speech or coordination.  Confusion.  Treat hypoglycemia promptly. If you are alert and able to safely swallow, follow the 15:15 rule:  Take 15-20 grams of rapid-acting glucose or carbohydrate. Rapid-acting options include glucose gel, glucose tablets, or 4 ounces (120 mL) of fruit juice, regular soda, or low fat milk.  Check your blood glucose level 15 minutes after taking the glucose.  Take 15-20 grams more of glucose if the repeat blood glucose level is still 70 mg/dL or below.  Eat a meal or snack within 1 hour once blood glucose levels return to normal.  Be alert to feeling very thirsty and urinating more frequently than usual, which are early signs of hyperglycemia. An early awareness of hyperglycemia allows for prompt treatment. Treat hyperglycemia as directed by your health care provider.  Engage in at least 150 minutes of moderate-intensity physical activity a week, spread over at least 3 days of the week or as directed by your health care provider. In  addition, you should engage in resistance exercise at least 2 times a week or as directed by your health care provider.  Adjust your medicine and food intake as needed if you start a new exercise or sport.  Follow your sick day plan at any time you are unable to eat or drink as usual.  Avoid tobacco use.  Limit alcohol intake to no more than 1 drink per day for nonpregnant women and 2 drinks per day for men. You should drink alcohol only when you are also eating food. Talk with your health care provider whether alcohol is safe for you. Tell your health care provider if you drink alcohol several times a week.  Follow up with your health care provider regularly.  Schedule an eye exam soon after the diagnosis of type 2 diabetes and then annually.  Perform daily skin and foot care. Examine your skin and feet daily for cuts, bruises, redness, nail problems, bleeding, blisters, or sores. A foot exam by a health care provider should be done annually.  Brush your teeth and gums at least twice a day and floss at least once a day. Follow up with your dentist regularly.  Share your diabetes management plan with your workplace or school.  Stay up-to-date with   immunizations.  Learn to manage stress.  Obtain ongoing diabetes education and support as needed.  Participate in, or seek rehabilitation as needed to maintain or improve independence and quality of life. Request a physical or occupational therapy referral if you are having foot or hand numbness or difficulties with grooming, dressing, eating, or physical activity. SEEK MEDICAL CARE IF:   You are unable to eat food or drink fluids for more than 6 hours.  You have nausea and vomiting for more than 6 hours.  Your blood glucose level is over 240 mg/dL.  There is a change in mental status.  You develop an additional serious illness.  You have diarrhea for more than 6 hours.  You have been sick or have had a fever for a couple of days  and are not getting better.  You have pain during any physical activity.  SEEK IMMEDIATE MEDICAL CARE IF:  You have difficulty breathing.  You have moderate to large ketone levels. MAKE SURE YOU:  Understand these instructions.  Will watch your condition.  Will get help right away if you are not doing well or get worse. Document Released: 12/08/2005 Document Revised: 12/13/2013 Document Reviewed: 07/06/2012 [employer] Patient Information 2015 [employer]. This information is not intended to replace advice given to you by your health care provider. Make sure you discuss any questions you have with your health care provider.  

## 2014-06-27 NOTE — Progress Notes (Signed)
Pre visit review using our clinic review tool, if applicable. No additional management support is needed unless otherwise documented below in the visit note. 

## 2014-06-28 ENCOUNTER — Telehealth: Payer: Self-pay | Admitting: Internal Medicine

## 2014-06-28 ENCOUNTER — Other Ambulatory Visit: Payer: Self-pay | Admitting: Internal Medicine

## 2014-06-28 ENCOUNTER — Other Ambulatory Visit: Payer: Self-pay | Admitting: Family

## 2014-06-28 ENCOUNTER — Other Ambulatory Visit: Payer: Self-pay

## 2014-06-28 MED ORDER — METFORMIN HCL 500 MG PO TABS: ORAL_TABLET | ORAL | Status: DC

## 2014-06-28 NOTE — Telephone Encounter (Signed)
Pt req rx on metFORMIN (GLUCOPHAGE) 500 MG tablet

## 2014-06-28 NOTE — Telephone Encounter (Signed)
rx sent in electronically 

## 2014-07-02 ENCOUNTER — Other Ambulatory Visit: Payer: Self-pay | Admitting: Internal Medicine

## 2014-07-03 ENCOUNTER — Telehealth: Payer: Self-pay | Admitting: Internal Medicine

## 2014-07-03 MED ORDER — LABETALOL HCL 300 MG PO TABS: ORAL_TABLET | ORAL | Status: DC

## 2014-07-03 NOTE — Telephone Encounter (Signed)
Pt followiong up on med rx labetalol (NORMODYNE) 300 MG tablet.  Pt is out .  [facility] [place] and [employer]

## 2014-07-03 NOTE — Telephone Encounter (Signed)
Done

## 2014-07-10 ENCOUNTER — Other Ambulatory Visit (INDEPENDENT_AMBULATORY_CARE_PROVIDER_SITE_OTHER): Payer: BC Managed Care – PPO

## 2014-07-10 ENCOUNTER — Other Ambulatory Visit: Payer: Self-pay

## 2014-07-10 ENCOUNTER — Other Ambulatory Visit: Payer: Medicare Other

## 2014-07-10 DIAGNOSIS — I10 Essential (primary) hypertension: Secondary | ICD-10-CM | POA: Diagnosis not present

## 2014-07-10 LAB — COMPREHENSIVE METABOLIC PANEL
ALT: 29 U/L (ref 0–53)
AST: 27 U/L (ref 0–37)
Albumin: 4.1 g/dL (ref 3.5–5.2)
Alkaline Phosphatase: 65 U/L (ref 39–117)
BUN: 26 mg/dL — AB (ref 6–23)
CALCIUM: 9.5 mg/dL (ref 8.4–10.5)
CHLORIDE: 107 meq/L (ref 96–112)
CO2: 25 mEq/L (ref 19–32)
CREATININE: 2.1 mg/dL — AB (ref 0.4–1.5)
GFR: 41.79 mL/min — ABNORMAL LOW (ref >60.00)
Glucose, Bld: 104 mg/dL — ABNORMAL HIGH (ref 70–99)
Potassium: 4.5 mEq/L (ref 3.5–5.1)
Sodium: 141 mEq/L (ref 135–145)
Total Bilirubin: 0.4 mg/dL (ref 0.2–1.2)
Total Protein: 7.4 g/dL (ref 6.0–8.3)

## 2014-07-10 MED ORDER — OLMESARTAN MEDOXOMIL 40 MG PO TABS: 40.0000 mg | ORAL_TABLET | Freq: Every day | ORAL | Status: DC

## 2014-07-24 ENCOUNTER — Other Ambulatory Visit: Payer: Self-pay | Admitting: Family

## 2014-07-24 ENCOUNTER — Encounter: Payer: Self-pay | Admitting: Family

## 2014-07-24 ENCOUNTER — Ambulatory Visit (INDEPENDENT_AMBULATORY_CARE_PROVIDER_SITE_OTHER): Payer: BC Managed Care – PPO | Admitting: Family

## 2014-07-24 VITALS — BP 134/92 | HR 72 | Ht 68.5 in | Wt 234.0 lb

## 2014-07-24 DIAGNOSIS — N289 Disorder of kidney and ureter, unspecified: Secondary | ICD-10-CM

## 2014-07-24 DIAGNOSIS — I10 Essential (primary) hypertension: Secondary | ICD-10-CM

## 2014-07-24 DIAGNOSIS — E119 Type 2 diabetes mellitus without complications: Secondary | ICD-10-CM

## 2014-07-24 DIAGNOSIS — R7989 Other specified abnormal findings of blood chemistry: Secondary | ICD-10-CM

## 2014-07-24 DIAGNOSIS — E7800 Pure hypercholesterolemia, unspecified: Secondary | ICD-10-CM

## 2014-07-24 DIAGNOSIS — R799 Abnormal finding of blood chemistry, unspecified: Secondary | ICD-10-CM

## 2014-07-24 LAB — BASIC METABOLIC PANEL
BUN: 19 mg/dL (ref 6–23)
CHLORIDE: 105 meq/L (ref 96–112)
CO2: 22 meq/L (ref 19–32)
Calcium: 9.1 mg/dL (ref 8.4–10.5)
Creatinine, Ser: 2 mg/dL — ABNORMAL HIGH (ref 0.4–1.5)
GFR: 42.74 mL/min — ABNORMAL LOW (ref >60.00)
Glucose, Bld: 95 mg/dL (ref 70–99)
POTASSIUM: 4.3 meq/L (ref 3.5–5.1)
Sodium: 135 mEq/L (ref 135–145)

## 2014-07-24 NOTE — Progress Notes (Signed)
Subjective:    Patient ID: Jared [name], male    DOB: 06-14-1946, 68 y.o.   MRN: [id number]  HPI  68 year old African American male, nonsmoker in today for recheck of elevated creatinine, his last labs. We discontinued hydrochlorothiazide and he is doing well. Blood pressure stable. He also has a history of type 2 diabetes, hyperlipidemia, obstructive sleep apnea that are all stable. Benicar was initiated but caused itching and her stopped. Went back to Lisinopril 40mg    Review of Systems  Constitutional: Negative.   HENT: Negative.   Respiratory: Negative.   Cardiovascular: Negative.   Gastrointestinal: Negative.   Endocrine: Negative.   Genitourinary: Negative.   Musculoskeletal: Negative.   Skin: Negative.   Allergic/Immunologic: Negative.   Psychiatric/Behavioral: Negative.    Past Medical History  Diagnosis Date  . 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  . GOUT 07/26/2007  . HYPERCHOLESTEROLEMIA, BORDERLINE 01/16/2010  . HYPERTENSION 07/26/2007  . OSTEOARTHRITIS 09/28/2007  . PLANTAR FASCIITIS, BILATERAL 04/19/2010  . PROSTATITIS, RECURRENT 11/08/2009  . RENAL INSUFFICIENCY 07/26/2007  . 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 URINARY FREQUENCY, CHRONIC 03/10/2008  . Stroke 01/21/2013    History   Social History  . Marital Status: Married    Spouse Name: Jared [name]    Number of Children: 5  . Years of Education: 12   Occupational History  . TESTOR     retired   Social History Main Topics  . Smoking status: Never Smoker   . Smokeless tobacco: Never Used  . Alcohol Use: No     Comment: none  . Drug Use: No  . Sexual Activity: No   Other Topics Concern  . Not on file   Social History Narrative   Patient is right handed, resides in home with wife    Past Surgical History  Procedure Laterality Date  . Rt arm surgery for gout      Family History  Problem Relation Age of Onset  . Heart disease Mother   . Heart disease Father   . CVA  Father   . Diabetes Sister     Allergies  Allergen Reactions  . Other     Strawberries Cant breath    Current Outpatient Prescriptions on File Prior to Visit  Medication Sig Dispense Refill  . amLODipine (NORVASC) 10 MG tablet TAKE 1 TABLET BY MOUTH EVERY DAY  90 tablet  3  . aspirin EC 325 MG tablet Take 1 tablet (325 mg total) by mouth daily.    0  . diazepam (VALIUM) 5 MG tablet TAKE 1 TABLET BY MOUTH EVERY DAY AS NEEDED  30 tablet  3  . indomethacin (INDOCIN) 50 MG capsule TAKE ONE CAPSULE BY MOUTH 3 TIMES A DAY  270 capsule  0  . labetalol (NORMODYNE) 300 MG tablet TAKE 1 TABLET BY MOUTH TWICE A DAY  60 tablet  3  . metFORMIN (GLUCOPHAGE) 500 MG tablet TAKE 1 TABLET BY MOUTH TWICE A DAY WITH A MEAL  180 tablet  0  . simvastatin (ZOCOR) 20 MG tablet TAKE 1 TABLET BY MOUTH EVERY DAY AT 6 PM  90 tablet  3  . tadalafil (CIALIS) 20 MG tablet Take 1 tablet (20 mg total) by mouth daily as needed for erectile dysfunction.  5 tablet  5  . tadalafil (CIALIS) 5 MG tablet Take 1 tablet (5 mg total) by mouth daily as needed for erectile dysfunction.  10 tablet  0  . allopurinol (ZYLOPRIM) 100 MG tablet Take  1 tablet (100 mg total) by mouth daily.  30 tablet  6  . doxazosin (CARDURA) 1 MG tablet Take 1 tablet (1 mg total) by mouth at bedtime.  30 tablet  11  . olmesartan (BENICAR) 40 MG tablet Take 1 tablet (40 mg total) by mouth daily.  30 tablet  0   No current facility-administered medications on file prior to visit.    BP 134/92  Pulse 72  Ht 5' 8.5" (1.74 m)  Wt 234 lb (106.142 kg)  BMI 35.06 kg/m2chart    Objective:   Physical Exam  Constitutional: He is oriented to person, place, and time. He appears well-developed and well-nourished.  Neck: Normal range of motion. Neck supple.  Cardiovascular: Normal rate, regular rhythm and normal heart sounds.   Pulmonary/Chest: Effort normal and breath sounds normal.  Abdominal: Soft. Bowel sounds are normal.  Musculoskeletal: Normal range  of motion.  Neurological: He is alert and oriented to person, place, and time.  Skin: Skin is warm and dry.  Psychiatric: He has a normal mood and affect.          Assessment & Plan:  Siddiq was seen today for follow-up.  Diagnoses and associated orders for this visit:  Unspecified essential hypertension - Basic Metabolic Panel  Elevated serum creatinine - Basic Metabolic Panel  Type II or unspecified type diabetes mellitus without mention of complication, not stated as uncontrolled  Pure hypercholesterolemia    BMP sent today will notify patient was also addressed with the diet, exercise followup in 6 months and sooner as needed.

## 2014-07-24 NOTE — Patient Instructions (Signed)
Cardiac Diet This diet can help prevent heart disease and stroke. Many factors influence your heart health, including eating and exercise habits. Coronary risk rises a lot with abnormal blood fat (lipid) levels. Cardiac meal planning includes limiting unhealthy fats, increasing healthy fats, and making other small dietary changes. General guidelines are as follows:  Adjust calorie intake to reach and maintain desirable body weight.  Limit total fat intake to less than 30% of total calories. Saturated fat should be less than 7% of calories.  Saturated fats are found in animal products and in some vegetable products. Saturated vegetable fats are found in coconut oil, cocoa butter, palm oil, and palm kernel oil. Read labels carefully to avoid these products as much as possible. Use butter in moderation. Choose tub margarines and oils that have 2 grams of fat or less. Good cooking oils are canola and olive oils.  Practice low-fat cooking techniques. Do not fry food. Instead, broil, bake, boil, steam, grill, roast on a rack, stir-fry, or microwave it. Other fat reducing suggestions include:  Remove the skin from poultry.  Remove all visible fat from meats.  Skim the fat off stews, soups, and gravies before serving them.  Steam vegetables in water or broth instead of sauting them in fat.  Avoid foods with trans fat (or hydrogenated oils), such as commercially fried foods and commercially baked goods. Commercial shortening and deep-frying fats will contain trans fat.  Increase intake of fruits, vegetables, whole grains, and legumes to replace foods high in fat.  Increase consumption of nuts, legumes, and seeds to at least 4 servings weekly. One serving of a legume equals  cup, and 1 serving of nuts or seeds equals  cup.  Choose whole grains more often. Have 3 servings per day (a serving is 1 ounce [oz]).  Eat 4 to 5 servings of vegetables per day. A serving of vegetables is 1 cup of raw leafy  vegetables;  cup of raw or cooked cut-up vegetables;  cup of vegetable juice.  Eat 4 to 5 servings of fruit per day. A serving of fruit is 1 medium whole fruit;  cup of dried fruit;  cup of fresh, frozen, or canned fruit;  cup of 100% fruit juice.  Increase your intake of dietary fiber to 20 to 30 grams per day. Insoluble fiber may help lower your risk of heart disease and may help curb your appetite.  Soluble fiber binds cholesterol to be removed from the blood. Foods high in soluble fiber are dried beans, citrus fruits, oats, apples, bananas, broccoli, Brussels sprouts, and eggplant.  Try to include foods fortified with plant sterols or stanols, such as yogurt, breads, juices, or margarines. Choose several fortified foods to achieve a daily intake of 2 to 3 grams of plant sterols or stanols.  Foods with omega-3 fats can help reduce your risk of heart disease. Aim to have a 3.5 oz portion of fatty fish twice per week, such as salmon, mackerel, albacore tuna, sardines, lake trout, or herring. If you wish to take a fish oil supplement, choose one that contains 1 gram of both DHA and EPA.  Limit processed meats to 2 servings (3 oz portion) weekly.  Limit the sodium in your diet to 1500 milligrams (mg) per day. If you have high blood pressure, talk to a registered dietitian about a DASH (Dietary Approaches to Stop Hypertension) eating plan.  Limit sweets and beverages with added sugar, such as soda, to no more than 5 servings per week. One   serving is:   1 tablespoon sugar.  1 tablespoon jelly or jam.   cup sorbet.  1 cup lemonade.   cup regular soda. CHOOSING FOODS Starches  Allowed: Breads: All kinds (wheat, rye, raisin, white, oatmeal, Italian, French, and English muffin bread). Low-fat rolls: English muffins, frankfurter and hamburger buns, bagels, pita bread, tortillas (not fried). Pancakes, waffles, biscuits, and muffins made with recommended oil.  Avoid: Products made with  saturated or trans fats, oils, or whole milk products. Butter rolls, cheese breads, croissants. Commercial doughnuts, muffins, sweet rolls, biscuits, waffles, pancakes, store-bought mixes. Crackers  Allowed: Low-fat crackers and snacks: Animal, graham, rye, saltine (with recommended oil, no lard), oyster, and matzo crackers. Bread sticks, melba toast, rusks, flatbread, pretzels, and light popcorn.  Avoid: High-fat crackers: cheese crackers, butter crackers, and those made with coconut, palm oil, or trans fat (hydrogenated oils). Buttered popcorn. Cereals  Allowed: Hot or cold whole-grain cereals.  Avoid: Cereals containing coconut, hydrogenated vegetable fat, or animal fat. Potatoes / Pasta / Rice  Allowed: All kinds of potatoes, rice, and pasta (such as macaroni, spaghetti, and noodles).  Avoid: Pasta or rice prepared with cream sauce or high-fat cheese. Chow mein noodles, French fries. Vegetables  Allowed: All vegetables and vegetable juices.  Avoid: Fried vegetables. Vegetables in cream, butter, or high-fat cheese sauces. Limit coconut. Fruit in cream or custard. Protein  Allowed: Limit your intake of meat, seafood, and poultry to no more than 6 oz (cooked weight) per day. All lean, well-trimmed beef, veal, pork, and lamb. All chicken and turkey without skin. All fish and shellfish. Wild game: wild duck, rabbit, pheasant, and venison. Egg whites or low-cholesterol egg substitutes may be used as desired. Meatless dishes: recipes with dried beans, peas, lentils, and tofu (soybean curd). Seeds and nuts: all seeds and most nuts.  Avoid: Prime grade and other heavily marbled and fatty meats, such as short ribs, spare ribs, rib eye roast or steak, frankfurters, sausage, bacon, and high-fat luncheon meats, mutton. Caviar. Commercially fried fish. Domestic duck, goose, venison sausage. Organ meats: liver, gizzard, heart, chitterlings, brains, kidney, sweetbreads. Dairy  Allowed: Low-fat  cheeses: nonfat or low-fat cottage cheese (1% or 2% fat), cheeses made with part skim milk, such as mozzarella, farmers, string, or ricotta. (Cheeses should be labeled no more than 2 to 6 grams fat per oz.). Skim (or 1%) milk: liquid, powdered, or evaporated. Buttermilk made with low-fat milk. Drinks made with skim or low-fat milk or cocoa. Chocolate milk or cocoa made with skim or low-fat (1%) milk. Nonfat or low-fat yogurt.  Avoid: Whole milk cheeses, including colby, cheddar, muenster, Monterey Jack, Havarti, Brie, Camembert, American, Swiss, and blue. Creamed cottage cheese, cream cheese. Whole milk and whole milk products, including buttermilk or yogurt made from whole milk, drinks made from whole milk. Condensed milk, evaporated whole milk, and 2% milk. Soups and Combination Foods  Allowed: Low-fat low-sodium soups: broth, dehydrated soups, homemade broth, soups with the fat removed, homemade cream soups made with skim or low-fat milk. Low-fat spaghetti, lasagna, chili, and Spanish rice if low-fat ingredients and low-fat cooking techniques are used.  Avoid: Cream soups made with whole milk, cream, or high-fat cheese. All other soups. Desserts and Sweets  Allowed: Sherbet, fruit ices, gelatins, meringues, and angel food cake. Homemade desserts with recommended fats, oils, and milk products. Jam, jelly, honey, marmalade, sugars, and syrups. Pure sugar candy, such as gum drops, hard candy, jelly beans, marshmallows, mints, and small amounts of dark chocolate.  Avoid: Commercially prepared   cakes, pies, cookies, frosting, pudding, or mixes for these products. Desserts containing whole milk products, chocolate, coconut, lard, palm oil, or palm kernel oil. Ice cream or ice cream drinks. Candy that contains chocolate, coconut, butter, hydrogenated fat, or unknown ingredients. Buttered syrups. Fats and Oils  Allowed: Vegetable oils: safflower, sunflower, corn, soybean, cottonseed, sesame, canola, olive,  or peanut. Non-hydrogenated margarines. Salad dressing or mayonnaise: homemade or commercial, made with a recommended oil. Low or nonfat salad dressing or mayonnaise.  Limit added fats and oils to 6 to 8 tsp per day (includes fats used in cooking, baking, salads, and spreads on bread). Remember to count the "hidden fats" in foods.  Avoid: Solid fats and shortenings: butter, lard, salt pork, bacon drippings. Gravy containing meat fat, shortening, or suet. Cocoa butter, coconut. Coconut oil, palm oil, palm kernel oil, or hydrogenated oils: these ingredients are often used in bakery products, nondairy creamers, whipped toppings, candy, and commercially fried foods. Read labels carefully. Salad dressings made of unknown oils, sour cream, or cheese, such as blue cheese and Roquefort. Cream, all kinds: half-and-half, light, heavy, or whipping. Sour cream or cream cheese (even if "light" or low-fat). Nondairy cream substitutes: coffee creamers and sour cream substitutes made with palm, palm kernel, hydrogenated oils, or coconut oil. Beverages  Allowed: Coffee (regular or decaffeinated), tea. Diet carbonated beverages, mineral water. Alcohol: Check with your caregiver. Moderation is recommended.  Avoid: Whole milk, regular sodas, and juice drinks with added sugar. Condiments  Allowed: All seasonings and condiments. Cocoa powder. "Cream" sauces made with recommended ingredients.  Avoid: Carob powder made with hydrogenated fats. SAMPLE MENU Breakfast   cup orange juice   cup oatmeal  1 slice toast  1 tsp margarine  1 cup skim milk Lunch  Turkey sandwich with 2 oz turkey, 2 slices bread  Lettuce and tomato slices  Fresh fruit  Carrot sticks  Coffee or tea Snack  Fresh fruit or low-fat crackers Dinner  3 oz lean ground beef  1 baked potato  1 tsp margarine   cup asparagus  Lettuce salad  1 tbs non-creamy dressing   cup peach slices  1 cup skim milk Document Released:  09/16/2008 Document Revised: 06/08/2012 Document Reviewed: 02/07/2014 [employer] Patient Information 2015 [employer]. This information is not intended to replace advice given to you by your health care provider. Make sure you discuss any questions you have with your health care provider.  

## 2014-07-31 ENCOUNTER — Other Ambulatory Visit: Payer: Self-pay | Admitting: Internal Medicine

## 2014-08-03 ENCOUNTER — Ambulatory Visit: Payer: BC Managed Care – PPO

## 2014-08-12 ENCOUNTER — Other Ambulatory Visit: Payer: Self-pay | Admitting: Internal Medicine

## 2014-09-01 ENCOUNTER — Other Ambulatory Visit: Payer: Self-pay | Admitting: Internal Medicine

## 2014-09-09 ENCOUNTER — Other Ambulatory Visit: Payer: Self-pay | Admitting: Family

## 2014-09-11 ENCOUNTER — Telehealth: Payer: Self-pay | Admitting: Family

## 2014-09-11 MED ORDER — ALLOPURINOL 100 MG PO TABS: 100.0000 mg | ORAL_TABLET | Freq: Every day | ORAL | Status: DC

## 2014-09-11 NOTE — Telephone Encounter (Signed)
[id number] - [place], [place] - [address] is requesting 90 day re-fill on allopurinol (ZYLOPRIM) 100 MG tablet

## 2014-09-11 NOTE — Telephone Encounter (Signed)
Done

## 2014-09-12 ENCOUNTER — Other Ambulatory Visit: Payer: Self-pay | Admitting: Internal Medicine

## 2014-09-29 ENCOUNTER — Telehealth: Payer: Self-pay

## 2014-09-29 NOTE — Telephone Encounter (Signed)
Left message to advise pt that forms are ready for pick up

## 2014-10-04 ENCOUNTER — Other Ambulatory Visit: Payer: Self-pay | Admitting: Nephrology

## 2014-10-04 DIAGNOSIS — N183 Chronic kidney disease, stage 3 unspecified: Secondary | ICD-10-CM

## 2014-10-09 ENCOUNTER — Ambulatory Visit
Admission: RE | Admit: 2014-10-09 | Discharge: 2014-10-09 | Disposition: A | Discharge status: Home / Self Care | Payer: Medicare Other | Source: Ambulatory Visit | Attending: Nephrology | Admitting: Nephrology

## 2014-10-09 DIAGNOSIS — N183 Chronic kidney disease, stage 3 unspecified: Secondary | ICD-10-CM

## 2014-10-20 ENCOUNTER — Other Ambulatory Visit: Payer: Self-pay | Admitting: Family

## 2014-10-21 ENCOUNTER — Other Ambulatory Visit: Payer: Self-pay | Admitting: Family

## 2014-10-23 ENCOUNTER — Ambulatory Visit (INDEPENDENT_AMBULATORY_CARE_PROVIDER_SITE_OTHER): Payer: Medicare Other | Admitting: Family

## 2014-10-23 ENCOUNTER — Encounter: Payer: Self-pay | Admitting: Family

## 2014-10-23 VITALS — BP 120/84 | HR 67 | Ht 68.5 in | Wt 226.0 lb

## 2014-10-23 DIAGNOSIS — E1129 Type 2 diabetes mellitus with other diabetic kidney complication: Secondary | ICD-10-CM

## 2014-10-23 DIAGNOSIS — N183 Chronic kidney disease, stage 3 unspecified: Secondary | ICD-10-CM

## 2014-10-23 DIAGNOSIS — E7800 Pure hypercholesterolemia, unspecified: Secondary | ICD-10-CM

## 2014-10-23 DIAGNOSIS — I10 Essential (primary) hypertension: Secondary | ICD-10-CM

## 2014-10-23 DIAGNOSIS — Z23 Encounter for immunization: Secondary | ICD-10-CM

## 2014-10-23 LAB — BASIC METABOLIC PANEL
BUN: 21 mg/dL (ref 6–23)
CHLORIDE: 105 meq/L (ref 96–112)
CO2: 21 mEq/L (ref 19–32)
Calcium: 9.3 mg/dL (ref 8.4–10.5)
Creatinine, Ser: 2.2 mg/dL — ABNORMAL HIGH (ref 0.4–1.5)
GFR: 39.32 mL/min — ABNORMAL LOW (ref >60.00)
Glucose, Bld: 113 mg/dL — ABNORMAL HIGH (ref 70–99)
Potassium: 4.3 mEq/L (ref 3.5–5.1)
Sodium: 139 mEq/L (ref 135–145)

## 2014-10-23 LAB — HEMOGLOBIN A1C: HEMOGLOBIN A1C: 5.6 % (ref 4.6–6.5)

## 2014-10-23 NOTE — Patient Instructions (Signed)
Diabetes and Exercise Exercising regularly is important. It is not just about losing weight. It has many health benefits, such as:  Improving your overall fitness, flexibility, and endurance.  Increasing your bone density.  Helping with weight control.  Decreasing your body fat.  Increasing your muscle strength.  Reducing stress and tension.  Improving your overall health. People with diabetes who exercise gain additional benefits because exercise:  Reduces appetite.  Improves the body's use of blood sugar (glucose).  Helps lower or control blood glucose.  Decreases blood pressure.  Helps control blood lipids (such as cholesterol and triglycerides).  Improves the body's use of the hormone insulin by:  Increasing the body's insulin sensitivity.  Reducing the body's insulin needs.  Decreases the risk for heart disease because exercising:  Lowers cholesterol and triglycerides levels.  Increases the levels of good cholesterol (such as high-density lipoproteins [HDL]) in the body.  Lowers blood glucose levels. YOUR ACTIVITY PLAN  Choose an activity that you enjoy and set realistic goals. Your health care provider or diabetes educator can help you make an activity plan that works for you. Exercise regularly as directed by your health care provider. This includes:  Performing resistance training twice a week such as push-ups, sit-ups, lifting weights, or using resistance bands.  Performing 150 minutes of cardio exercises each week such as walking, running, or playing sports.  Staying active and spending no more than 90 minutes at one time being inactive. Even short bursts of exercise are good for you. Three 10-minute sessions spread throughout the day are just as beneficial as a single 30-minute session. Some exercise ideas include:  Taking the dog for a walk.  Taking the stairs instead of the elevator.  Dancing to your favorite song.  Doing an exercise  video.  Doing your favorite exercise with a friend. RECOMMENDATIONS FOR EXERCISING WITH TYPE 1 OR TYPE 2 DIABETES   Check your blood glucose before exercising. If blood glucose levels are greater than 240 mg/dL, check for urine ketones. Do not exercise if ketones are present.  Avoid injecting insulin into areas of the body that are going to be exercised. For example, avoid injecting insulin into:  The arms when playing tennis.  The legs when jogging.  Keep a record of:  Food intake before and after you exercise.  Expected peak times of insulin action.  Blood glucose levels before and after you exercise.  The type and amount of exercise you have done.  Review your records with your health care provider. Your health care provider will help you to develop guidelines for adjusting food intake and insulin amounts before and after exercising.  If you take insulin or oral hypoglycemic agents, watch for signs and symptoms of hypoglycemia. They include:  Dizziness.  Shaking.  Sweating.  Chills.  Confusion.  Drink plenty of water while you exercise to prevent dehydration or heat stroke. Body water is lost during exercise and must be replaced.  Talk to your health care provider before starting an exercise program to make sure it is safe for you. Remember, almost any type of activity is better than none. Document Released: 02/28/2004 Document Revised: 04/24/2014 Document Reviewed: 05/17/2013 ExitCare Patient Information 2015 [employer]. This information is not intended to replace advice given to you by your health care provider. Make sure you discuss any questions you have with your health care provider.  

## 2014-10-23 NOTE — Progress Notes (Signed)
Subjective:    Patient ID: Jared [name], male    DOB: [birth year], 68 y.o.   MRN: [id number]  HPI  68 year old AAM, nonsmoker, is in today for a recheck of HTN, HLD, Type 2 DM, Renal insufficiency under the care of nephrology. He reports doing well. Tolerates medications well. Requesting to have disability for completed to stop loans.   Review of Systems  Constitutional: Negative.   Respiratory: Negative.   Cardiovascular: Negative.   Gastrointestinal: Negative.   Endocrine: Negative.   Genitourinary: Negative.   Musculoskeletal: Negative.   Skin: Negative.   Allergic/Immunologic: Negative.   Neurological: Negative.   Hematological: Negative.   Psychiatric/Behavioral: Negative.    Past Medical History  Diagnosis Date  . 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  . GOUT 07/26/2007  . HYPERCHOLESTEROLEMIA, BORDERLINE 01/16/2010  . HYPERTENSION 07/26/2007  . OSTEOARTHRITIS 09/28/2007  . PLANTAR FASCIITIS, BILATERAL 04/19/2010  . PROSTATITIS, RECURRENT 11/08/2009  . RENAL INSUFFICIENCY 07/26/2007  . 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 URINARY FREQUENCY, CHRONIC 03/10/2008  . Stroke 01/21/2013    History   Social History  . Marital Status: Married    Spouse Name: Stanton Kidney    Number of Children: 5  . Years of Education: 12   Occupational History  . TESTOR     retired   Social History Main Topics  . Smoking status: Never Smoker   . Smokeless tobacco: Never Used  . Alcohol Use: No     Comment: none  . Drug Use: No  . Sexual Activity: No   Other Topics Concern  . Not on file   Social History Narrative   Patient is right handed, resides in home with wife    Past Surgical History  Procedure Laterality Date  . Rt arm surgery for gout      Family History  Problem Relation Age of Onset  . Heart disease Mother   . Heart disease Father   . CVA Father   . Diabetes Sister     Allergies  Allergen Reactions  . Other     Strawberries Cant  breath    Current Outpatient Prescriptions on File Prior to Visit  Medication Sig Dispense Refill  . amLODipine (NORVASC) 10 MG tablet TAKE 1 TABLET BY MOUTH EVERY DAY 90 tablet 3  . aspirin EC 325 MG tablet Take 1 tablet (325 mg total) by mouth daily.  0  . diazepam (VALIUM) 5 MG tablet TAKE 1 TABLET BY MOUTH EVERY DAY AS NEEDED 30 tablet 3  . doxazosin (CARDURA) 1 MG tablet Take 1 tablet (1 mg total) by mouth at bedtime. 30 tablet 11  . lisinopril (PRINIVIL,ZESTRIL) 40 MG tablet Take 40 mg by mouth daily.    . simvastatin (ZOCOR) 20 MG tablet TAKE 1 TABLET BY MOUTH EVERY DAY AT 6 PM 90 tablet 3  . labetalol (NORMODYNE) 300 MG tablet TAKE 1 TABLET BY MOUTH TWICE A DAY 60 tablet 3  . metFORMIN (GLUCOPHAGE) 500 MG tablet TAKE 1 TABLET BY MOUTH TWICE A DAY WITH A MEAL 180 tablet 0   No current facility-administered medications on file prior to visit.    BP 120/84 mmHg  Pulse 67  Ht 5' 8.5" (1.74 m)  Wt 226 lb (102.513 kg)  BMI 33.86 kg/m2chart    Objective:   Physical Exam  Constitutional: He is oriented to person, place, and time. He appears well-developed and well-nourished.  HENT:  Right Ear: External ear normal.  Left Ear: External  ear normal.  Nose: Nose normal.  Mouth/Throat: Oropharynx is clear and moist.  Neck: Normal range of motion.  Cardiovascular: Normal rate, regular rhythm and normal heart sounds.   Pulmonary/Chest: Effort normal and breath sounds normal.  Abdominal: Soft. Bowel sounds are normal.  Musculoskeletal: Normal range of motion.  Neurological: He is alert and oriented to person, place, and time.  Skin: Skin is warm and dry.  Psychiatric: He has a normal mood and affect.          Assessment & Plan:  Maxen was seen today for follow-up.  Diagnoses and associated orders for this visit:  Type 2 diabetes mellitus with other diabetic kidney complication - Hemoglobin 123456 - Basic Metabolic Panel  Pure hypercholesterolemia  Essential  hypertension - Basic Metabolic Panel  Chronic kidney disease, stage 3 (moderate)  Need for prophylactic vaccination against Streptococcus pneumoniae (pneumococcus) - Pneumococcal conjugate vaccine 13-valent   Call the office with any questions or concerns. Recheck in 3 months, pending labs, and sooner as needed.

## 2014-10-23 NOTE — Progress Notes (Signed)
Pre visit review using our clinic review tool, if applicable. No additional management support is needed unless otherwise documented below in the visit note. 

## 2014-11-20 ENCOUNTER — Ambulatory Visit: Payer: BC Managed Care – PPO | Admitting: Neurology

## 2014-11-23 ENCOUNTER — Telehealth: Payer: Self-pay | Admitting: Neurology

## 2014-11-23 ENCOUNTER — Ambulatory Visit: Payer: BC Managed Care – PPO | Admitting: Neurology

## 2014-11-23 NOTE — Telephone Encounter (Signed)
Patient no showed for an appointment today, 11/23/2014.

## 2014-11-28 ENCOUNTER — Ambulatory Visit: Payer: BC Managed Care – PPO | Admitting: Neurology

## 2014-12-01 ENCOUNTER — Telehealth: Payer: Self-pay

## 2014-12-01 MED ORDER — LISINOPRIL 40 MG PO TABS: 40.0000 mg | ORAL_TABLET | Freq: Every day | ORAL | Status: DC

## 2014-12-01 NOTE — Telephone Encounter (Signed)
Rx request for Lisinopril 40 mg tablet- Take 1 tablet by mouth every day #90  Pharm:  [facility] [address]  Pls advise.

## 2014-12-01 NOTE — Telephone Encounter (Signed)
Done

## 2014-12-13 ENCOUNTER — Encounter: Payer: Self-pay | Admitting: Family

## 2015-01-08 ENCOUNTER — Other Ambulatory Visit: Payer: Self-pay | Admitting: Cardiovascular Disease

## 2015-01-09 NOTE — Telephone Encounter (Signed)
Rx(s) sent to pharmacy electronically.  

## 2015-01-23 ENCOUNTER — Other Ambulatory Visit: Payer: Self-pay | Admitting: Family

## 2015-01-23 ENCOUNTER — Ambulatory Visit: Payer: BC Managed Care – PPO | Admitting: Neurology

## 2015-01-26 ENCOUNTER — Other Ambulatory Visit: Payer: Self-pay | Admitting: Family

## 2015-02-25 ENCOUNTER — Other Ambulatory Visit: Payer: Self-pay | Admitting: Family

## 2015-03-05 ENCOUNTER — Other Ambulatory Visit: Payer: Self-pay | Admitting: Cardiovascular Disease

## 2015-03-05 NOTE — Telephone Encounter (Signed)
Rx has been sent to the pharmacy electronically. ° °

## 2015-03-21 ENCOUNTER — Telehealth: Payer: Self-pay | Admitting: Cardiovascular Disease

## 2015-03-21 ENCOUNTER — Other Ambulatory Visit: Payer: Self-pay | Admitting: Cardiovascular Disease

## 2015-03-21 NOTE — Telephone Encounter (Signed)
Rx(s) sent to pharmacy electronically. Staff message sent to [facility], Dr. Evette Georges scheduler, to contact patient for OV

## 2015-03-23 NOTE — Telephone Encounter (Signed)
Closed encounter °

## 2015-04-05 ENCOUNTER — Other Ambulatory Visit: Payer: Self-pay | Admitting: Cardiovascular Disease

## 2015-04-09 DIAGNOSIS — N183 Chronic kidney disease, stage 3 (moderate): Secondary | ICD-10-CM | POA: Diagnosis not present

## 2015-04-09 DIAGNOSIS — N2581 Secondary hyperparathyroidism of renal origin: Secondary | ICD-10-CM | POA: Diagnosis not present

## 2015-04-09 DIAGNOSIS — I129 Hypertensive chronic kidney disease with stage 1 through stage 4 chronic kidney disease, or unspecified chronic kidney disease: Secondary | ICD-10-CM | POA: Diagnosis not present

## 2015-04-09 DIAGNOSIS — N189 Chronic kidney disease, unspecified: Secondary | ICD-10-CM | POA: Diagnosis not present

## 2015-04-09 DIAGNOSIS — D631 Anemia in chronic kidney disease: Secondary | ICD-10-CM | POA: Diagnosis not present

## 2015-04-28 ENCOUNTER — Other Ambulatory Visit: Payer: Self-pay | Admitting: Family

## 2015-05-22 ENCOUNTER — Other Ambulatory Visit: Payer: Self-pay

## 2015-05-22 MED ORDER — SIMVASTATIN 20 MG PO TABS: ORAL_TABLET | ORAL | Status: DC

## 2015-05-24 ENCOUNTER — Ambulatory Visit (INDEPENDENT_AMBULATORY_CARE_PROVIDER_SITE_OTHER): Payer: Medicare Other | Admitting: Cardiovascular Disease

## 2015-05-24 VITALS — BP 168/106 | HR 63 | Ht 68.0 in | Wt 219.8 lb

## 2015-05-24 DIAGNOSIS — I10 Essential (primary) hypertension: Secondary | ICD-10-CM | POA: Diagnosis not present

## 2015-05-24 DIAGNOSIS — N259 Disorder resulting from impaired renal tubular function, unspecified: Secondary | ICD-10-CM

## 2015-05-24 DIAGNOSIS — E119 Type 2 diabetes mellitus without complications: Secondary | ICD-10-CM | POA: Diagnosis not present

## 2015-05-24 DIAGNOSIS — Z79899 Other long term (current) drug therapy: Secondary | ICD-10-CM

## 2015-05-24 DIAGNOSIS — E785 Hyperlipidemia, unspecified: Secondary | ICD-10-CM | POA: Diagnosis not present

## 2015-05-24 LAB — LIPID PANEL
CHOL/HDL RATIO: 3.4 ratio
Cholesterol: 103 mg/dL (ref 0–200)
HDL: 30 mg/dL — ABNORMAL LOW (ref >=40)
LDL Cholesterol: 48 mg/dL (ref 0–99)
TRIGLYCERIDES: 126 mg/dL (ref <150)
VLDL: 25 mg/dL (ref 0–40)

## 2015-05-24 LAB — COMPREHENSIVE METABOLIC PANEL
ALK PHOS: 65 U/L (ref 39–117)
ALT: 69 U/L — ABNORMAL HIGH (ref 0–53)
AST: 52 U/L — AB (ref 0–37)
Albumin: 4.5 g/dL (ref 3.5–5.2)
BILIRUBIN TOTAL: 0.6 mg/dL (ref 0.2–1.2)
BUN: 24 mg/dL — ABNORMAL HIGH (ref 6–23)
CALCIUM: 9.6 mg/dL (ref 8.4–10.5)
CHLORIDE: 106 meq/L (ref 96–112)
CO2: 25 mEq/L (ref 19–32)
CREATININE: 2.11 mg/dL — AB (ref 0.50–1.35)
Glucose, Bld: 91 mg/dL (ref 70–99)
POTASSIUM: 4.6 meq/L (ref 3.5–5.3)
SODIUM: 141 meq/L (ref 135–145)
TOTAL PROTEIN: 7.2 g/dL (ref 6.0–8.3)

## 2015-05-24 LAB — TSH: TSH: 1.069 u[IU]/mL (ref 0.350–4.500)

## 2015-05-24 LAB — CBC
HCT: 41.2 % (ref 39.0–52.0)
Hemoglobin: 14 g/dL (ref 13.0–17.0)
MCH: 29.4 pg (ref 26.0–34.0)
MCHC: 34 g/dL (ref 30.0–36.0)
MCV: 86.4 fL (ref 78.0–100.0)
MPV: 11 fL (ref 8.6–12.4)
Platelets: 228 10*3/uL (ref 150–400)
RBC: 4.77 MIL/uL (ref 4.22–5.81)
RDW: 14.3 % (ref 11.5–15.5)
WBC: 5.8 10*3/uL (ref 4.0–10.5)

## 2015-05-24 LAB — HEMOGLOBIN A1C
HEMOGLOBIN A1C: 5.8 % — AB (ref <5.7)
Mean Plasma Glucose: 120 mg/dL — ABNORMAL HIGH (ref <117)

## 2015-05-24 MED ORDER — DOXAZOSIN MESYLATE 2 MG PO TABS: 2.0000 mg | ORAL_TABLET | Freq: Every day | ORAL | Status: DC

## 2015-05-24 NOTE — Patient Instructions (Signed)
Your physician has requested that you have an echocardiogram. Echocardiography is a painless test that uses sound waves to create images of your heart. It provides your doctor with information about the size and shape of your heart and how well your heart's chambers and valves are working. This procedure takes approximately one hour. There are no restrictions for this procedure.  Your physician recommends that you return for lab work fasting.  Your physician has recommended you make the following change in your medication: the doxazosin has been increased to 2 mg. A new prescription has been sent to your pharmacy to reflect this change  Your physician recommends that you schedule a follow-up appointment in: 2 months.Marland Kitchen

## 2015-05-26 ENCOUNTER — Encounter: Payer: Self-pay | Admitting: Cardiovascular Disease

## 2015-05-26 DIAGNOSIS — E785 Hyperlipidemia, unspecified: Secondary | ICD-10-CM | POA: Insufficient documentation

## 2015-05-26 NOTE — Progress Notes (Signed)
Patient ID: Jared [name], male   DOB: 10/25/1946, 69 y.o.   MRN: [id number]       HPI: Jared [name]  is a 69 year old male who presents for an 68 month follow-up cardiology evaluation    Jared [name] has a long-standing history of hypertension, type 2 diabetes mellitus, hyperlipidemia, gout, as well as obstructive sleep apnea. He has been on amlodipine 10 mg, labetalol 300 mg twice a day, lisinopril 40 mg daily and cardura 1 mg for blood pressure control. He has a history of obstructive sleep apnea and did not do well with CPAP therapy but  Has done well with BiPAP therapy. 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 Carotid duplex imaging in 01/22/2013 showed mild soft plaque in the distal common carotid and origin of the ICA bilaterally without significant stenoses. A 2-D echo Doppler study at that time showed an ejection fraction of 60-65%  with grade 1 diastolic dysfunction. He did have mild mitral regurgitation and moderate left atrial dilatation. A nuclear perfusion study  In October 2014 was normal. Post-rest ejection fraction was 57%. There was no evidence for scar or ischemia. A NMR profile has shown an LDL particle number of 1059, calculated LDL cholesterol 61, total cholesterol 121, HDL 34 with a reduced HDL particle #25.2. He had 768 small LDL particles which are elevated. His insulin resistance with elevated at 73 but the patient is diabetic. Hemoglobin 13.2 hematocrit 37.4. BUN 15 creatinine 1.48. Uric acid was increased to 10.2. Hemoglobin A1c was 5.9. He had normal liver function studies.   since I last saw him, he admits to feeling well. He denies recent episodes of chest pain. He had been followed by Dr. Benay Pillow and is now plan to reestablish with another physician. I have reviewed records from 2015.  Apparently at that time, he had episodes of increased renal function.  When I had seen him in October 2014.  His creatinine was 1.48. Creatinine in July, August and November 2015  was in the 2.0-2.2 range. He continues to take simvastatin 20 mg for hyper lipid.  He is diabetic on metformin.  There also is a history of gout for which he takes.  All and culture seen.  He presents for evaluation.  Past Medical History  Diagnosis Date  . 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  . GOUT 07/26/2007  . HYPERCHOLESTEROLEMIA, BORDERLINE 01/16/2010  . HYPERTENSION 07/26/2007  . OSTEOARTHRITIS 09/28/2007  . PLANTAR FASCIITIS, BILATERAL 04/19/2010  . PROSTATITIS, RECURRENT 11/08/2009  . RENAL INSUFFICIENCY 07/26/2007  . 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 URINARY FREQUENCY, CHRONIC 03/10/2008  . Stroke 01/21/2013    Past Surgical History  Procedure Laterality Date  . Rt arm surgery for gout      Allergies  Allergen Reactions  . Other     Strawberries Cant breath    Current Outpatient Prescriptions  Medication Sig Dispense Refill  . allopurinol (ZYLOPRIM) 100 MG tablet Take 100 mg by mouth daily.  6  . amLODipine (NORVASC) 10 MG tablet TAKE 1 TABLET BY MOUTH EVERY DAY 90 tablet 3  . aspirin EC 325 MG tablet Take 1 tablet (325 mg total) by mouth daily.  0  . colchicine 0.6 MG tablet Take 0.6 mg by mouth daily.  11  . diazepam (VALIUM) 5 MG tablet TAKE 1 TABLET BY MOUTH EVERY DAY AS NEEDED 30 tablet 3  . labetalol (NORMODYNE) 300 MG tablet TAKE 1 TABLET BY MOUTH TWICE A DAY 60 tablet  3  . lisinopril (PRINIVIL,ZESTRIL) 40 MG tablet TAKE 1 TABLET BY MOUTH EVERY DAY 90 tablet 0  . metFORMIN (GLUCOPHAGE) 500 MG tablet TAKE 1 TABLET BY MOUTH TWICE A DAY WITH A MEAL 180 tablet 1  . simvastatin (ZOCOR) 20 MG tablet TAKE 1 TABLET BY MOUTH EVERY DAY AT 6 PM 90 tablet 0  . doxazosin (CARDURA) 2 MG tablet Take 1 tablet (2 mg total) by mouth daily. 30 tablet 6   No current facility-administered medications for this visit.    Social history is notable in that he is married for 19 years. He has 4 children 4 grandchildren 3 great-grandchildren. 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 He completed 12th grade of education there is no tobacco use. He does not drink alcohol. He does not routinely exercise.  Family History  Problem Relation Age of Onset  . Heart disease Mother   . Heart disease Father   . CVA Father   . Diabetes Sister    ROS General: Negative; No fevers, chills, or night sweats;  HEENT: Negative; No changes in vision or hearing, sinus congestion, difficulty swallowing Pulmonary: Negative; No cough, wheezing, shortness of breath, hemoptysis Cardiovascular: Negative; No chest pain, presyncope, syncope, palpitations GI: Negative; No nausea, vomiting, diarrhea, or abdominal pain GU:  History of prostatitis Musculoskeletal: Negative; no myalgias, joint pain, or weakness Hematologic/Oncology: Negative; no easy bruising, bleeding Endocrine:  Positive for diabetes mellitus Neuro: Negative; no changes in balance, headaches Skin: Negative; No rashes or skin lesions Psychiatric: Negative; No behavioral problems, depression Sleep:  Positive for obstructive sleep apnea on BiPAP. No recent snoring, daytime sleepiness, hypersomnolence, bruxism, restless legs, hypnogognic hallucinations, no cataplexy Other comprehensive 14 point system review is negative.   PE BP 168/106 mmHg  Pulse 63  Ht '5\' 8"'  (1.727 m)  Wt 99.701 kg (219 lb 12.8 oz)  BMI 33.43 kg/m2 Repeat blood pressure by me was 154/90 without significant orthostatic change. General: Alert, oriented, no distress.  Skin: normal turgor, no rashes HEENT: Normocephalic, atraumatic. Pupils round and reactive; sclera anicteric; Fundi mild arteriolar narrowing. Nose without nasal septal hypertrophy Mouth/Parynx benign; Mallinpatti scale 3 Neck: No JVD, no carotid bruits Lungs: clear to ausculatation and percussion; no wheezing or rales Heart: RRR, s1 s2 normal 1/6 systolic murmur .  No S3 gallop.  No rubs thrills or heaves.  Abdomen: soft, nontender; no hepatosplenomehaly, BS+;  abdominal aorta nontender and not dilated by palpation. Pulses 2+ Musculoskeletal: Without obvious joint deformities. Extremities: no clubbing cyanosis or edema, Homan's sign negative  Neurologic: grossly normal without focal findings. Psychologic: Normal mood and affect  ECG (independently read by me):  Normal sinus rhythm at 63 bpm. Nonspecific inferolateral T wave changes.   LABS:  BMP Latest Ref Rng 05/24/2015 10/23/2014 07/24/2014  Glucose 70 - 99 mg/dL 91 113(H) 95  BUN 6 - 23 mg/dL 24(H) 21 19  Creatinine 0.50 - 1.35 mg/dL 2.11(H) 2.2(H) 2.0(H)  Sodium 135 - 145 mEq/L 141 139 135  Potassium 3.5 - 5.3 mEq/L 4.6 4.3 4.3  Chloride 96 - 112 mEq/L 106 105 105  CO2 19 - 32 mEq/L '25 21 22  ' Calcium 8.4 - 10.5 mg/dL 9.6 9.3 9.1   Hepatic Function Latest Ref Rng 05/24/2015 07/10/2014 06/27/2014  Total Protein 6.0 - 8.3 g/dL 7.2 7.4 7.6  Albumin 3.5 - 5.2 g/dL 4.5 4.1 4.2  AST 0 - 37 U/L 52(H) 27 25  ALT 0 - 53 U/L 69(H) 29 25  Alk Phosphatase 39 - 117 U/L  65 65 77  Total Bilirubin 0.2 - 1.2 mg/dL 0.6 0.4 0.5  Bilirubin, Direct 0.0 - 0.3 mg/dL - - -   CBC Latest Ref Rng 05/24/2015 10/17/2013 09/28/2013  WBC 4.0 - 10.5 K/uL 5.8 5.1 8.6  Hemoglobin 13.0 - 17.0 g/dL 14.0 13.2 13.7(A)  Hematocrit 39.0 - 52.0 % 41.2 37.4(L) 41.7(A)  Platelets 150 - 400 K/uL 228 268 -   Lab Results  Component Value Date   MCV 86.4 05/24/2015   MCV 81.8 10/17/2013   MCV 90.5 09/28/2013   Lab Results  Component Value Date   TSH 1.069 05/24/2015   Lab Results  Component Value Date   HGBA1C 5.8* 05/24/2015   Lipid Panel     Component Value Date/Time   CHOL 103 05/24/2015 1138   CHOL 121 10/17/2013 1013   TRIG 126 05/24/2015 1138   TRIG 128 10/17/2013 1013   HDL 30* 05/24/2015 1138   HDL 34* 10/17/2013 1013   CHOLHDL 3.4 05/24/2015 1138   VLDL 25 05/24/2015 1138   LDLCALC 48 05/24/2015 1138   LDLCALC 61 10/17/2013 1013   LDLDIRECT 85.1 06/03/2013 1212     RADIOLOGY: No results  found.   ASSESSMENT AND PLAN: Jared [name] is a 69 year old gentleman with a long-standing history of hypertension, as well as a history of type 2 diabetes mellitus, hyperlipidemia, gout, and obstructive sleep apnea.  He presents today in his blood pressure is elevated on his current regimen as listed above. I am recommending titration of Cardura from 1 mg to 2 mg daily and at present.  He will continue his current dose of lisinopril, nadolol, amlodipine. He does have renal insufficiency and has not had laboratory checked in over 7 months.  I will recheck blood work and adjustments may be made to his medical therapy. I'm scheduling him for follow-up echo Doppler study to reassess systolic and diastolic function. He continues to use BiPAP therapy with a nasal mask and seems to be tolerating this well. From August 2015 through today, he has lost 15 pounds.  I encouraged additional weight loss with his body mass index at 33.4 which is still compatible with mild obesity. We will contact him regarding laboratory results.  I will see him in 6-8 weeks for reevaluation.   time spent: 25 minutes Troy Sine, MD, [facility] 05/26/2015 11:32 AM

## 2015-05-28 ENCOUNTER — Ambulatory Visit (HOSPITAL_COMMUNITY)
Admission: RE | Admit: 2015-05-28 | Discharge: 2015-05-28 | Disposition: A | Discharge status: Home / Self Care | Payer: Medicare Other | Source: Ambulatory Visit | Attending: Cardiology | Admitting: Cardiology

## 2015-05-28 DIAGNOSIS — I517 Cardiomegaly: Secondary | ICD-10-CM | POA: Insufficient documentation

## 2015-05-28 DIAGNOSIS — I351 Nonrheumatic aortic (valve) insufficiency: Secondary | ICD-10-CM | POA: Insufficient documentation

## 2015-05-28 DIAGNOSIS — I10 Essential (primary) hypertension: Secondary | ICD-10-CM

## 2015-05-28 DIAGNOSIS — I509 Heart failure, unspecified: Secondary | ICD-10-CM | POA: Diagnosis present

## 2015-05-29 ENCOUNTER — Telehealth: Payer: Self-pay | Admitting: *Deleted

## 2015-05-29 NOTE — Telephone Encounter (Signed)
-----   Message from Troy Sine, MD sent at 05/27/2015  2:27 PM EDT ----- Labs ok x inc LFT; hold zocor.  Cr elevated but stable. Re-check in 2 weeks with LFT; consider resuming zocor at 1/2 if then stable

## 2015-06-03 ENCOUNTER — Other Ambulatory Visit: Payer: Self-pay | Admitting: Family

## 2015-06-04 ENCOUNTER — Encounter: Payer: Self-pay | Admitting: Cardiovascular Disease

## 2015-06-04 ENCOUNTER — Other Ambulatory Visit: Payer: Self-pay | Admitting: *Deleted

## 2015-06-04 DIAGNOSIS — R899 Unspecified abnormal finding in specimens from other organs, systems and tissues: Secondary | ICD-10-CM

## 2015-06-04 DIAGNOSIS — E785 Hyperlipidemia, unspecified: Secondary | ICD-10-CM

## 2015-06-04 DIAGNOSIS — Z79899 Other long term (current) drug therapy: Secondary | ICD-10-CM

## 2015-06-09 ENCOUNTER — Emergency Department (HOSPITAL_COMMUNITY)
Admission: EM | Admit: 2015-06-09 | Discharge: 2015-06-09 | Disposition: A | Discharge status: Home Health | Payer: Medicare Other | Attending: Emergency Medicine | Admitting: Emergency Medicine

## 2015-06-09 ENCOUNTER — Encounter (HOSPITAL_COMMUNITY): Payer: Self-pay | Admitting: *Deleted

## 2015-06-09 DIAGNOSIS — M109 Gout, unspecified: Secondary | ICD-10-CM | POA: Insufficient documentation

## 2015-06-09 DIAGNOSIS — Z87448 Personal history of other diseases of urinary system: Secondary | ICD-10-CM | POA: Insufficient documentation

## 2015-06-09 DIAGNOSIS — Z79899 Other long term (current) drug therapy: Secondary | ICD-10-CM | POA: Insufficient documentation

## 2015-06-09 DIAGNOSIS — Z7982 Long term (current) use of aspirin: Secondary | ICD-10-CM | POA: Insufficient documentation

## 2015-06-09 DIAGNOSIS — I10 Essential (primary) hypertension: Secondary | ICD-10-CM | POA: Diagnosis not present

## 2015-06-09 DIAGNOSIS — Z8673 Personal history of transient ischemic attack (TIA), and cerebral infarction without residual deficits: Secondary | ICD-10-CM | POA: Diagnosis not present

## 2015-06-09 DIAGNOSIS — M1990 Unspecified osteoarthritis, unspecified site: Secondary | ICD-10-CM | POA: Diagnosis not present

## 2015-06-09 LAB — COMPREHENSIVE METABOLIC PANEL
ALBUMIN: 3.8 g/dL (ref 3.5–5.0)
ALT: 63 U/L (ref 17–63)
ANION GAP: 9 (ref 5–15)
AST: 56 U/L — AB (ref 15–41)
Alkaline Phosphatase: 62 U/L (ref 38–126)
BUN: 24 mg/dL — AB (ref 6–20)
CALCIUM: 9.2 mg/dL (ref 8.9–10.3)
CO2: 24 mmol/L (ref 22–32)
Chloride: 107 mmol/L (ref 101–111)
Creatinine, Ser: 2.17 mg/dL — ABNORMAL HIGH (ref 0.61–1.24)
GFR calc Af Amer: 35 mL/min — ABNORMAL LOW (ref >60)
GFR calc non Af Amer: 30 mL/min — ABNORMAL LOW (ref >60)
Glucose, Bld: 91 mg/dL (ref 65–99)
Potassium: 4.3 mmol/L (ref 3.5–5.1)
SODIUM: 140 mmol/L (ref 135–145)
Total Bilirubin: 0.8 mg/dL (ref 0.3–1.2)
Total Protein: 6.7 g/dL (ref 6.5–8.1)

## 2015-06-09 LAB — CBC WITH DIFFERENTIAL/PLATELET
Basophils Absolute: 0 10*3/uL (ref 0.0–0.1)
Basophils Relative: 1 % (ref 0–1)
EOS PCT: 7 % — AB (ref 0–5)
Eosinophils Absolute: 0.4 10*3/uL (ref 0.0–0.7)
HEMATOCRIT: 38.2 % — AB (ref 39.0–52.0)
Hemoglobin: 13.4 g/dL (ref 13.0–17.0)
LYMPHS PCT: 37 % (ref 12–46)
Lymphs Abs: 2.1 10*3/uL (ref 0.7–4.0)
MCH: 30.3 pg (ref 26.0–34.0)
MCHC: 35.1 g/dL (ref 30.0–36.0)
MCV: 86.4 fL (ref 78.0–100.0)
MONOS PCT: 8 % (ref 3–12)
Monocytes Absolute: 0.5 10*3/uL (ref 0.1–1.0)
Neutro Abs: 2.8 10*3/uL (ref 1.7–7.7)
Neutrophils Relative %: 47 % (ref 43–77)
PLATELETS: 201 10*3/uL (ref 150–400)
RBC: 4.42 MIL/uL (ref 4.22–5.81)
RDW: 13.9 % (ref 11.5–15.5)
WBC: 5.8 10*3/uL (ref 4.0–10.5)

## 2015-06-09 LAB — I-STAT TROPONIN, ED: TROPONIN I, POC: 0 ng/mL (ref 0.00–0.08)

## 2015-06-09 MED ORDER — AMLODIPINE BESYLATE 10 MG PO TABS: 10.0000 mg | ORAL_TABLET | Freq: Every day | ORAL | Status: DC

## 2015-06-09 MED ORDER — AMLODIPINE BESYLATE 5 MG PO TABS
10.0000 mg | ORAL_TABLET | Freq: Once | ORAL | Status: AC
  Administered 2015-06-09: 10 mg via ORAL
  Filled 2015-06-09: qty 2

## 2015-06-09 NOTE — Discharge Instructions (Signed)
Take your norvasc as prescribed.   Continue other meds.   Follow up with your doctor.   Return to ER if you have headaches, chest pain, shortness of breath, abdominal pain.

## 2015-06-09 NOTE — ED Notes (Signed)
Pt reports high BP for 2 days. Denies any vision changes. Home BP 235/118. Pt reports medications as prescribed. No neuro deficits at triage.

## 2015-06-09 NOTE — ED Provider Notes (Signed)
CSN: [id number]     Arrival date & time 06/09/15  51 History   First MD Initiated Contact with Patient 06/09/15 1858     Chief Complaint  Patient presents with  . Hypertension     (Consider location/radiation/quality/duration/timing/severity/associated sxs/prior Treatment) The history is provided by the patient.  Jared [name] is a 69 y.o. male history of hypertension, renal insufficiency here presenting with elevated blood pressure. Noticed that his blood pressure has been elevated for the last week or so. Blood pressure has been around the 180s. Denies headache or vision changes or chest pain or shortness of breath or abdominal pain. He ran out of his Norvasc 3 weeks ago. Has been taking his Lipitor, lisinopril as prescribed. Check his blood pressure today was 235/118 so came here for eval.    Past Medical History  Diagnosis Date  . 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  . GOUT 07/26/2007  . HYPERCHOLESTEROLEMIA, BORDERLINE 01/16/2010  . HYPERTENSION 07/26/2007  . OSTEOARTHRITIS 09/28/2007  . PLANTAR FASCIITIS, BILATERAL 04/19/2010  . PROSTATITIS, RECURRENT 11/08/2009  . RENAL INSUFFICIENCY 07/26/2007  . 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 URINARY FREQUENCY, CHRONIC 03/10/2008  . Stroke 01/21/2013   Past Surgical History  Procedure Laterality Date  . Rt arm surgery for gout     Family History  Problem Relation Age of Onset  . Heart disease Mother   . Heart disease Father   . CVA Father   . Diabetes Sister    History  Substance Use Topics  . Smoking status: Never Smoker   . Smokeless tobacco: Never Used  . Alcohol Use: No     Comment: none    Review of Systems  Cardiovascular: Negative for chest pain.  Neurological: Positive for dizziness. Negative for headaches.  All other systems reviewed and are negative.     Allergies  Other  Home Medications   Prior to Admission medications   Medication Sig Start Date End Date Taking?  Authorizing Provider  allopurinol (ZYLOPRIM) 100 MG tablet Take 100 mg by mouth daily. 05/17/15  Yes Historical Provider, MD  amLODipine (NORVASC) 10 MG tablet TAKE 1 TABLET BY MOUTH EVERY DAY 05/18/14  Yes Ricard Dillon, MD  aspirin EC 325 MG tablet Take 1 tablet (325 mg total) by mouth daily. 01/23/13  Yes Barton Dubois, MD  colchicine 0.6 MG tablet Take 0.6 mg by mouth daily. 04/30/15  Yes Historical Provider, MD  doxazosin (CARDURA) 2 MG tablet Take 1 tablet (2 mg total) by mouth daily. 05/24/15  Yes Troy Sine, MD  labetalol (NORMODYNE) 300 MG tablet TAKE 1 TABLET BY MOUTH TWICE A DAY 02/26/15  Yes Kennyth Arnold, FNP  lisinopril (PRINIVIL,ZESTRIL) 40 MG tablet TAKE 1 TABLET BY MOUTH EVERY DAY 06/04/15  Yes Kennyth Arnold, FNP  metFORMIN (GLUCOPHAGE) 500 MG tablet TAKE 1 TABLET BY MOUTH TWICE A DAY WITH A MEAL 01/29/15  Yes Kennyth Arnold, FNP  simvastatin (ZOCOR) 20 MG tablet TAKE 1 TABLET BY MOUTH EVERY DAY AT 6 PM 05/22/15  Yes Eulas Post, MD  diazepam (VALIUM) 5 MG tablet TAKE 1 TABLET BY MOUTH EVERY DAY AS NEEDED 09/12/14   Kennyth Arnold, FNP   BP 183/117 mmHg  Pulse 83  Temp(Src) 98.5 F (36.9 C) (Oral)  Resp 12  SpO2 98% Physical Exam  Constitutional: He is oriented to person, place, and time. He appears well-developed and well-nourished.  HENT:  Head: Normocephalic.  Mouth/Throat: Oropharynx is clear and moist.  Eyes: Conjunctivae are normal. Pupils are equal, round, and reactive to light.  Neck: Normal range of motion. Neck supple.  Cardiovascular: Normal rate, regular rhythm and normal heart sounds.   Pulmonary/Chest: Effort normal and breath sounds normal. No respiratory distress. He has no wheezes. He has no rales.  Abdominal: Soft. Bowel sounds are normal. He exhibits no distension. There is no tenderness. There is no rebound.  Musculoskeletal: Normal range of motion. He exhibits no edema or tenderness.  Neurological: He is alert and oriented to person, place, and time.  No cranial nerve deficit. Coordination normal.  CN 2-12 intact. Nl strength throughout.   Skin: Skin is warm and dry.  Psychiatric: He has a normal mood and affect. His behavior is normal. Judgment and thought content normal.  Nursing note and vitals reviewed.   ED Course  Procedures (including critical care time) Labs Review Labs Reviewed  CBC WITH DIFFERENTIAL/PLATELET - Abnormal; Notable for the following:    HCT 38.2 (*)    Eosinophils Relative 7 (*)    All other components within normal limits  COMPREHENSIVE METABOLIC PANEL - Abnormal; Notable for the following:    BUN 24 (*)    Creatinine, Ser 2.17 (*)    AST 56 (*)    GFR calc non Af Amer 30 (*)    GFR calc Af Amer 35 (*)    All other components within normal limits  I-STAT TROPOININ, ED    Imaging Review No results found.   EKG Interpretation   Date/Time:  Saturday June 09 2015 19:57:02 EDT Ventricular Rate:  59 PR Interval:  136 QRS Duration: 105 QT Interval:  460 QTC Calculation: 456 R Axis:   -43 Text Interpretation:  Sinus rhythm Left ventricular hypertrophy Borderline  T abnormalities, diffuse leads No significant change since last tracing  Confirmed by YAO  MD, DAVID ([id number]) on 06/09/2015 8:09:26 PM      MDM   Final diagnoses:  None   Rachel E Falke is a 69 y.o. male here with elevated BP. No headaches, chest pain, ab pain. I doubt hypertensive emergency. Will check labs and give norvasc since he has been off of it for 3 weeks.   9:39 PM Labs at baseline. Cr at baseline. BP dec to 183/117 from 222/118 after PO norvasc. Patient took his own labetalol in the ED as well. No signs of hypertensive emergency. Will dc home.    Wandra Arthurs, MD 06/09/15 [phone]

## 2015-06-09 NOTE — ED Notes (Signed)
Watching TV family at bedside

## 2015-06-11 ENCOUNTER — Other Ambulatory Visit: Payer: Self-pay

## 2015-06-11 MED ORDER — AMLODIPINE BESYLATE 10 MG PO TABS: 10.0000 mg | ORAL_TABLET | Freq: Every day | ORAL | Status: DC

## 2015-06-18 ENCOUNTER — Other Ambulatory Visit: Payer: Self-pay

## 2015-06-19 ENCOUNTER — Other Ambulatory Visit: Payer: Self-pay | Admitting: Family

## 2015-07-10 DIAGNOSIS — I6789 Other cerebrovascular disease: Secondary | ICD-10-CM | POA: Diagnosis not present

## 2015-07-11 ENCOUNTER — Encounter: Payer: Self-pay | Admitting: Family

## 2015-07-11 ENCOUNTER — Ambulatory Visit (INDEPENDENT_AMBULATORY_CARE_PROVIDER_SITE_OTHER): Payer: Medicare Other | Admitting: Family

## 2015-07-11 VITALS — BP 150/90 | HR 60 | Temp 98.8°F | Wt 225.0 lb

## 2015-07-11 DIAGNOSIS — S030XXA Dislocation of jaw, initial encounter: Secondary | ICD-10-CM

## 2015-07-11 DIAGNOSIS — S0300XA Dislocation of jaw, unspecified side, initial encounter: Secondary | ICD-10-CM

## 2015-07-11 DIAGNOSIS — G4700 Insomnia, unspecified: Secondary | ICD-10-CM

## 2015-07-11 DIAGNOSIS — Z8673 Personal history of transient ischemic attack (TIA), and cerebral infarction without residual deficits: Secondary | ICD-10-CM

## 2015-07-11 DIAGNOSIS — I10 Essential (primary) hypertension: Secondary | ICD-10-CM

## 2015-07-11 MED ORDER — DIAZEPAM 5 MG PO TABS: 5.0000 mg | ORAL_TABLET | Freq: Every day | ORAL | Status: DC | PRN

## 2015-07-11 NOTE — Patient Instructions (Signed)
Stroke Prevention Some medical conditions and behaviors are associated with an increased chance of having a stroke. You may prevent a stroke by making healthy choices and managing medical conditions. HOW CAN I REDUCE MY RISK OF HAVING A STROKE?   Stay physically active. Get at least 30 minutes of activity on most or all days.  Do not smoke. It may also be helpful to avoid exposure to secondhand smoke.  Limit alcohol use. Moderate alcohol use is considered to be:  No more than 2 drinks per day for men.  No more than 1 drink per day for nonpregnant women.  Eat healthy foods. This involves:  Eating 5 or more servings of fruits and vegetables a day.  Making dietary changes that address high blood pressure (hypertension), high cholesterol, diabetes, or obesity.  Manage your cholesterol levels.  Making food choices that are high in fiber and low in saturated fat, trans fat, and cholesterol may control cholesterol levels.  Take any prescribed medicines to control cholesterol as directed by your health care provider.  Manage your diabetes.  Controlling your carbohydrate and sugar intake is recommended to manage diabetes.  Take any prescribed medicines to control diabetes as directed by your health care provider.  Control your hypertension.  Making food choices that are low in salt (sodium), saturated fat, trans fat, and cholesterol is recommended to manage hypertension.  Take any prescribed medicines to control hypertension as directed by your health care provider.  Maintain a healthy weight.  Reducing calorie intake and making food choices that are low in sodium, saturated fat, trans fat, and cholesterol are recommended to manage weight.  Stop drug abuse.  Avoid taking birth control pills.  Talk to your health care provider about the risks of taking birth control pills if you are over 35 years old, smoke, get migraines, or have ever had a blood clot.  Get evaluated for sleep  disorders (sleep apnea).  Talk to your health care provider about getting a sleep evaluation if you snore a lot or have excessive sleepiness.  Take medicines only as directed by your health care provider.  For some people, aspirin or blood thinners (anticoagulants) are helpful in reducing the risk of forming abnormal blood clots that can lead to stroke. If you have the irregular heart rhythm of atrial fibrillation, you should be on a blood thinner unless there is a good reason you cannot take them.  Understand all your medicine instructions.  Make sure that other conditions (such as anemia or atherosclerosis) are addressed. SEEK IMMEDIATE MEDICAL CARE IF:   You have sudden weakness or numbness of the face, arm, or leg, especially on one side of the body.  Your face or eyelid droops to one side.  You have sudden confusion.  You have trouble speaking (aphasia) or understanding.  You have sudden trouble seeing in one or both eyes.  You have sudden trouble walking.  You have dizziness.  You have a loss of balance or coordination.  You have a sudden, severe headache with no known cause.  You have new chest pain or an irregular heartbeat. Any of these symptoms may represent a serious problem that is an emergency. Do not wait to see if the symptoms will go away. Get medical help at once. Call your local emergency services (911 in [place] Do not drive yourself to the hospital. Document Released: 01/15/2005 Document Revised: 04/24/2014 Document Reviewed: 06/10/2013 [employer] Patient Information 2015 [employer]. This information is not intended to replace advice given   to you by your health care provider. Make sure you discuss any questions you have with your health care provider.  

## 2015-07-11 NOTE — Progress Notes (Signed)
Pre visit review using our clinic review tool, if applicable. No additional management support is needed unless otherwise documented below in the visit note. 

## 2015-07-11 NOTE — Progress Notes (Signed)
Subjective:    Patient ID: Jared [name], male    DOB: 1946-08-17, 69 y.o.   MRN: [id number]  HPI 69 year old male with a  History of CVA, hypertension, hyperlipemia, and type 2 diabetes is here today for recheck and follow up on disability paperwork.  Patient states that he is doing well with no complaints. Tolerates medications well. Requesting a refill on Valium that he takes 2-3 times a week to help him sleep. States that he has not had any incidence of shortness of breath or pain.   Review of Systems  Constitutional: Negative.   HENT: Positive for ear pain.        Minimal occassional left ear discomfort.  Eyes: Negative.   Respiratory: Negative.   Cardiovascular: Negative.   Genitourinary: Negative.   Musculoskeletal: Negative.   Skin: Negative.   Allergic/Immunologic: Negative.   Neurological: Positive for dizziness.       Occassional dizziness when getting out of bed or rising from a seated position  Psychiatric/Behavioral: Negative.    Past Medical History  Diagnosis Date  . 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  . GOUT 07/26/2007  . HYPERCHOLESTEROLEMIA, BORDERLINE 01/16/2010  . HYPERTENSION 07/26/2007  . OSTEOARTHRITIS 09/28/2007  . PLANTAR FASCIITIS, BILATERAL 04/19/2010  . PROSTATITIS, RECURRENT 11/08/2009  . RENAL INSUFFICIENCY 07/26/2007  . 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 URINARY FREQUENCY, CHRONIC 03/10/2008  . Stroke 01/21/2013    History   Social History  . Marital Status: Married    Spouse Name: Jared [name]  . Number of Children: 5  . Years of Education: 12   Occupational History  . TESTOR     retired   Social History Main Topics  . Smoking status: Never Smoker   . Smokeless tobacco: Never Used  . Alcohol Use: No     Comment: none  . Drug Use: No  . Sexual Activity: No   Other Topics Concern  . Not on file   Social History Narrative   Patient is right handed, resides in home with wife    Past Surgical History    Procedure Laterality Date  . Rt arm surgery for gout      Family History  Problem Relation Age of Onset  . Heart disease Mother   . Heart disease Father   . CVA Father   . Diabetes Sister     Allergies  Allergen Reactions  . Other Shortness Of Breath and Other (See Comments)    Strawberries     Current Outpatient Prescriptions on File Prior to Visit  Medication Sig Dispense Refill  . allopurinol (ZYLOPRIM) 100 MG tablet Take 100 mg by mouth daily.  6  . amLODipine (NORVASC) 10 MG tablet Take 1 tablet (10 mg total) by mouth daily. 30 tablet 1  . aspirin EC 325 MG tablet Take 1 tablet (325 mg total) by mouth daily.  0  . colchicine 0.6 MG tablet Take 0.6 mg by mouth daily.  11  . doxazosin (CARDURA) 2 MG tablet Take 1 tablet (2 mg total) by mouth daily. 30 tablet 6  . labetalol (NORMODYNE) 300 MG tablet TAKE 1 TABLET BY MOUTH TWICE A DAY 60 tablet 3  . lisinopril (PRINIVIL,ZESTRIL) 40 MG tablet TAKE 1 TABLET BY MOUTH EVERY DAY 90 tablet 0  . metFORMIN (GLUCOPHAGE) 500 MG tablet TAKE 1 TABLET BY MOUTH TWICE A DAY WITH A MEAL 180 tablet 1  . simvastatin (ZOCOR) 20 MG tablet TAKE 1 TABLET BY MOUTH EVERY  DAY AT 6 PM 90 tablet 0   No current facility-administered medications on file prior to visit.    BP 150/90 mmHg  Pulse 60  Temp(Src) 98.8 F (37.1 C) (Oral)  Wt 225 lb (102.059 kg)chart     Objective:   Physical Exam  Constitutional: He is oriented to person, place, and time.  HENT:  Right Ear: External ear normal.  Left Ear: External ear normal.  Cardiovascular: Normal rate, regular rhythm, normal heart sounds and intact distal pulses.   Pulmonary/Chest: Effort normal and breath sounds normal.  Abdominal: Soft. Bowel sounds are normal.  Musculoskeletal: Normal range of motion.  Neurological: He is alert and oriented to person, place, and time.  Skin: Skin is warm and dry.  Psychiatric: He has a normal mood and affect. His behavior is normal. Judgment and thought  content normal.          Assessment & Plan:  Wlliam was seen today for follow-up.  Diagnoses and all orders for this visit:  Essential hypertension  TMJ (dislocation of temporomandibular joint), initial encounter  History of CVA (cerebrovascular accident)  Insomnia  Other orders -     diazepam (VALIUM) 5 MG tablet; Take 1 tablet (5 mg total) by mouth daily as needed.   Disability forms completed and given to patient today.  Heart Healthy and Stroke Prevention discharge information given to patient.  Patient to return to office in 4 months and as needed.

## 2015-07-26 ENCOUNTER — Other Ambulatory Visit: Payer: Self-pay | Admitting: Family

## 2015-07-26 DIAGNOSIS — N183 Chronic kidney disease, stage 3 (moderate): Secondary | ICD-10-CM | POA: Diagnosis not present

## 2015-07-26 DIAGNOSIS — N189 Chronic kidney disease, unspecified: Secondary | ICD-10-CM | POA: Diagnosis not present

## 2015-07-26 DIAGNOSIS — N2581 Secondary hyperparathyroidism of renal origin: Secondary | ICD-10-CM | POA: Diagnosis not present

## 2015-07-30 ENCOUNTER — Other Ambulatory Visit: Payer: Self-pay | Admitting: Family

## 2015-07-30 ENCOUNTER — Telehealth: Payer: Self-pay | Admitting: *Deleted

## 2015-07-30 NOTE — Telephone Encounter (Signed)
-----   Message from Troy Sine, MD sent at 05/27/2015  2:27 PM EDT ----- Labs ok x inc LFT; hold zocor.  Cr elevated but stable. Re-check in 2 weeks with LFT; consider resuming zocor at 1/2 if then stable

## 2015-08-14 ENCOUNTER — Telehealth: Payer: Self-pay | Admitting: Neurology

## 2015-08-14 NOTE — Telephone Encounter (Signed)
Lattie Haw with [facility] called inquiring about a statement of medical necessity form that was faxed around  07/10/15. Please call and advise. She can be reached at [phone].

## 2015-08-14 NOTE — Telephone Encounter (Signed)
I spoke to [employer]. She asked if we had gotten a form for the patient from them. I have not seen it but advised that Dr. Rexene Alberts would not sign. Patient last seen 04/2014 and no showed last appt. Lattie Haw stated that she will call the patient and advise him to make a f/u with [place].

## 2015-08-22 ENCOUNTER — Other Ambulatory Visit: Payer: Self-pay | Admitting: *Deleted

## 2015-08-22 ENCOUNTER — Other Ambulatory Visit: Payer: Self-pay

## 2015-08-22 ENCOUNTER — Telehealth: Payer: Self-pay

## 2015-08-22 DIAGNOSIS — I129 Hypertensive chronic kidney disease with stage 1 through stage 4 chronic kidney disease, or unspecified chronic kidney disease: Secondary | ICD-10-CM | POA: Diagnosis not present

## 2015-08-22 DIAGNOSIS — N2581 Secondary hyperparathyroidism of renal origin: Secondary | ICD-10-CM | POA: Diagnosis not present

## 2015-08-22 DIAGNOSIS — N183 Chronic kidney disease, stage 3 (moderate): Secondary | ICD-10-CM | POA: Diagnosis not present

## 2015-08-22 DIAGNOSIS — D631 Anemia in chronic kidney disease: Secondary | ICD-10-CM | POA: Diagnosis not present

## 2015-08-22 MED ORDER — ALLOPURINOL 100 MG PO TABS: 100.0000 mg | ORAL_TABLET | Freq: Every day | ORAL | Status: DC

## 2015-08-22 MED ORDER — SIMVASTATIN 20 MG PO TABS: ORAL_TABLET | ORAL | Status: DC

## 2015-08-22 MED ORDER — LISINOPRIL 40 MG PO TABS: 40.0000 mg | ORAL_TABLET | Freq: Every day | ORAL | Status: DC

## 2015-08-22 MED ORDER — LABETALOL HCL 300 MG PO TABS: 300.0000 mg | ORAL_TABLET | Freq: Two times a day (BID) | ORAL | Status: DC

## 2015-08-22 NOTE — Telephone Encounter (Signed)
Rx declined sent to pharmacy

## 2015-08-22 NOTE — Addendum Note (Signed)
Addended by: Westley Hummer B on: 08/22/2015 04:39 PM   Modules accepted: Orders

## 2015-08-22 NOTE — Telephone Encounter (Signed)
Had 3 refills and they should only be taken sparingly. Declined

## 2015-08-22 NOTE — Telephone Encounter (Signed)
Patient requesting refill of Diazepam.  Please advise.

## 2015-08-28 ENCOUNTER — Other Ambulatory Visit: Payer: Self-pay

## 2015-08-28 MED ORDER — METFORMIN HCL 500 MG PO TABS: ORAL_TABLET | ORAL | Status: DC

## 2015-08-30 ENCOUNTER — Ambulatory Visit (INDEPENDENT_AMBULATORY_CARE_PROVIDER_SITE_OTHER): Payer: Medicare Other | Admitting: Cardiovascular Disease

## 2015-08-30 ENCOUNTER — Encounter: Payer: Self-pay | Admitting: Cardiovascular Disease

## 2015-08-30 VITALS — BP 150/88 | HR 59 | Ht 68.0 in | Wt 229.9 lb

## 2015-08-30 DIAGNOSIS — I10 Essential (primary) hypertension: Secondary | ICD-10-CM | POA: Diagnosis not present

## 2015-08-30 DIAGNOSIS — E1121 Type 2 diabetes mellitus with diabetic nephropathy: Secondary | ICD-10-CM | POA: Diagnosis not present

## 2015-08-30 DIAGNOSIS — G4733 Obstructive sleep apnea (adult) (pediatric): Secondary | ICD-10-CM | POA: Diagnosis not present

## 2015-08-30 DIAGNOSIS — E785 Hyperlipidemia, unspecified: Secondary | ICD-10-CM | POA: Diagnosis not present

## 2015-08-30 DIAGNOSIS — Z9989 Dependence on other enabling machines and devices: Secondary | ICD-10-CM

## 2015-08-30 NOTE — Patient Instructions (Signed)
Your physician wants you to follow-up in: 6 months or sooner if needed. You will receive a reminder letter in the mail two months in advance. If you don't receive a letter, please call our office to schedule the follow-up appointment. 

## 2015-08-30 NOTE — Progress Notes (Signed)
Patient ID: KENRICK PORE, male   DOB: [birth year], 69 y.o.   MRN: [id number]       HPI: Mr. Yutaka Holberg  is a 69 year old male who presents for a 3 month follow-up cardiology evaluation    Mr. Motyka has a long-standing history of hypertension, type 2 diabetes mellitus, hyperlipidemia, gout, and obstructive sleep apnea. He had been on amlodipine 10 mg, labetalol 300 mg twice a day, lisinopril 40 mg daily and cardura 1 mg for blood pressure control. He has a history of obstructive sleep apnea and did not do well with CPAP therapy but has done well with BiPAP therapy. 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 Carotid duplex imaging in 01/22/2013 showed mild soft plaque in the distal common carotid and origin of the ICA bilaterally without significant stenoses. A 2-D echo Doppler study at that time showed an ejection fraction of 60-65%  with grade 1 diastolic dysfunction. He did have mild mitral regurgitation and moderate left atrial dilatation. A nuclear perfusion study  In October 2014 was normal. Post-rest ejection fraction was 57%. There was no evidence for scar or ischemia. A NMR profile has shown an LDL particle number of 1059, calculated LDL cholesterol 61, total cholesterol 121, HDL 34 with a reduced HDL particle #25.2. He had 768 small LDL particles which are elevated. His insulin resistance with elevated at 73 but the patient is diabetic. Hemoglobin 13.2 hematocrit 37.4. BUN 15 creatinine 1.48. Uric acid was increased to 10.2. Hemoglobin A1c was 5.9. He had normal liver function studies.  I last saw him 3 months ago after I had not seen him in over a year and a half.  he admits to feeling well. He denies recent episodes of chest pain. He had been followed by Dr. Benay Pillow and is now plan to reestablish with another physician. I have reviewed records from 2015.  Apparently at that time, he had episodes of increased renal function.  When I had seen him in October 2014.  His creatinine was  1.48. Creatinine in July, August and November 2015 was in the 2.0-2.2 range. He continues to take simvastatin 20 mg for hyper lipid.  He is diabetic on metformin.  There also is a history of gout for which he takes allopurinol and colchicine.  When I last saw he was hypertensive with a blood pressure of 168/106.  I further titrated his Cardura to 2 mg.  He tells me last week.  He saw Dr. Posey Pronto.  His renal function has remained stable with his creatinines in the 2-2.2 range.  I do not have the results of last week, but in June his creatinine was 2.17.  He denies chest pain.  He denies palpitations.  Past Medical History  Diagnosis Date  . 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  . GOUT 07/26/2007  . HYPERCHOLESTEROLEMIA, BORDERLINE 01/16/2010  . HYPERTENSION 07/26/2007  . OSTEOARTHRITIS 09/28/2007  . PLANTAR FASCIITIS, BILATERAL 04/19/2010  . PROSTATITIS, RECURRENT 11/08/2009  . RENAL INSUFFICIENCY 07/26/2007  . 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 URINARY FREQUENCY, CHRONIC 03/10/2008  . Stroke 01/21/2013    Past Surgical History  Procedure Laterality Date  . Rt arm surgery for gout      Allergies  Allergen Reactions  . Other Shortness Of Breath and Other (See Comments)    Strawberries     Current Outpatient Prescriptions  Medication Sig Dispense Refill  . allopurinol (ZYLOPRIM) 100 MG tablet Take 1 tablet (100 mg total) by mouth daily.  90 tablet 0  . amLODipine (NORVASC) 10 MG tablet Take 1 tablet (10 mg total) by mouth daily. 30 tablet 1  . aspirin EC 325 MG tablet Take 1 tablet (325 mg total) by mouth daily.  0  . colchicine 0.6 MG tablet Take 0.6 mg by mouth daily.  11  . diazepam (VALIUM) 5 MG tablet Take 1 tablet (5 mg total) by mouth daily as needed. 30 tablet 3  . doxazosin (CARDURA) 2 MG tablet Take 1 tablet (2 mg total) by mouth daily. 30 tablet 6  . labetalol (NORMODYNE) 300 MG tablet Take 1 tablet (300 mg total) by mouth 2 (two) times daily. 180  tablet 0  . lisinopril (PRINIVIL,ZESTRIL) 40 MG tablet Take 1 tablet (40 mg total) by mouth daily. 90 tablet 1  . metFORMIN (GLUCOPHAGE) 500 MG tablet TAKE 1 TABLET BY MOUTH TWICE A DAY WITH A MEAL 180 tablet 1  . simvastatin (ZOCOR) 20 MG tablet TAKE 1 TABLET BY MOUTH EVERY DAY AT 6 PM 90 tablet 0   No current facility-administered medications for this visit.    Social history is notable in that he is married for 19 years. He has 4 children 4 grandchildren 3 great-grandchildren. 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 He completed 12th grade of education there is no tobacco use. He does not drink alcohol. He does not routinely exercise.  Family History  Problem Relation Age of Onset  . Heart disease Mother   . Heart disease Father   . CVA Father   . Diabetes Sister    ROS General: Negative; No fevers, chills, or night sweats;  HEENT: Negative; No changes in vision or hearing, sinus congestion, difficulty swallowing Pulmonary: Negative; No cough, wheezing, shortness of breath, hemoptysis Cardiovascular: Negative; No chest pain, presyncope, syncope, palpitations GI: Negative; No nausea, vomiting, diarrhea, or abdominal pain GU:  History of prostatitis Musculoskeletal: Negative; no myalgias, joint pain, or weakness Hematologic/Oncology: Negative; no easy bruising, bleeding Endocrine:  Positive for diabetes mellitus Neuro: Negative; no changes in balance, headaches Skin: Negative; No rashes or skin lesions Psychiatric: Negative; No behavioral problems, depression Sleep:  Positive for obstructive sleep apnea on BiPAP. No recent snoring, daytime sleepiness, hypersomnolence, bruxism, restless legs, hypnogognic hallucinations, no cataplexy Other comprehensive 14 point system review is negative.   PE BP 150/88 mmHg  Pulse 59  Ht '5\' 8"'  (1.727 m)  Wt 229 lb 14.4 oz (104.282 kg)  BMI 34.96 kg/m2  Wt Readings from Last 3 Encounters:  08/30/15 229 lb 14.4 oz (104.282 kg)  07/11/15 225  lb (102.059 kg)  05/24/15 219 lb 12.8 oz (99.701 kg)   Repeat blood pressure by me was 150/86 without significant orthostatic change. General: Alert, oriented, no distress.  Skin: normal turgor, no rashes HEENT: Normocephalic, atraumatic. Pupils round and reactive; sclera anicteric; Fundi mild arteriolar narrowing. Nose without nasal septal hypertrophy Mouth/Parynx benign; Mallinpatti scale 3 Neck: No JVD, no carotid bruits Lungs: clear to ausculatation and percussion; no wheezing or rales Heart: RRR, s1 s2 normal 1/6 systolic murmur .  No S3 gallop.  No rubs thrills or heaves.  Abdomen: soft, nontender; no hepatosplenomehaly, BS+; abdominal aorta nontender and not dilated by palpation. Pulses 2+ Musculoskeletal: Without obvious joint deformities. Extremities: no clubbing cyanosis or edema, Homan's sign negative  Neurologic: grossly normal without focal findings. Psychologic: Normal mood and affect  ECG (independently read by me): Sinus rhythm at 59 bpm.  Nonspecific ST changes.  Normal intervals.  June 2016 ECG (independently read by me):  Normal sinus rhythm at 63 bpm. Nonspecific inferolateral T wave changes.   LABS:  BMP Latest Ref Rng 06/09/2015 05/24/2015 10/23/2014  Glucose 65 - 99 mg/dL 91 91 113(H)  BUN 6 - 20 mg/dL 24(H) 24(H) 21  Creatinine 0.61 - 1.24 mg/dL 2.17(H) 2.11(H) 2.2(H)  Sodium 135 - 145 mmol/L 140 141 139  Potassium 3.5 - 5.1 mmol/L 4.3 4.6 4.3  Chloride 101 - 111 mmol/L 107 106 105  CO2 22 - 32 mmol/L '24 25 21  ' Calcium 8.9 - 10.3 mg/dL 9.2 9.6 9.3   Hepatic Function Latest Ref Rng 06/09/2015 05/24/2015 07/10/2014  Total Protein 6.5 - 8.1 g/dL 6.7 7.2 7.4  Albumin 3.5 - 5.0 g/dL 3.8 4.5 4.1  AST 15 - 41 U/L 56(H) 52(H) 27  ALT 17 - 63 U/L 63 69(H) 29  Alk Phosphatase 38 - 126 U/L 62 65 65  Total Bilirubin 0.3 - 1.2 mg/dL 0.8 0.6 0.4  Bilirubin, Direct 0.0 - 0.3 mg/dL - - -   CBC Latest Ref Rng 06/09/2015 05/24/2015 10/17/2013  WBC 4.0 - 10.5 K/uL 5.8 5.8 5.1    Hemoglobin 13.0 - 17.0 g/dL 13.4 14.0 13.2  Hematocrit 39.0 - 52.0 % 38.2(L) 41.2 37.4(L)  Platelets 150 - 400 K/uL 201 228 268   Lab Results  Component Value Date   MCV 86.4 06/09/2015   MCV 86.4 05/24/2015   MCV 81.8 10/17/2013   Lab Results  Component Value Date   TSH 1.069 05/24/2015   Lab Results  Component Value Date   HGBA1C 5.8* 05/24/2015   Lipid Panel     Component Value Date/Time   CHOL 103 05/24/2015 1138   CHOL 121 10/17/2013 1013   TRIG 126 05/24/2015 1138   TRIG 128 10/17/2013 1013   HDL 30* 05/24/2015 1138   HDL 34* 10/17/2013 1013   CHOLHDL 3.4 05/24/2015 1138   VLDL 25 05/24/2015 1138   LDLCALC 48 05/24/2015 1138   LDLCALC 61 10/17/2013 1013   LDLDIRECT 85.1 06/03/2013 1212     RADIOLOGY: No results found.   ASSESSMENT AND PLAN: Mr. Gentile is a 69 year old gentleman with a long-standing history of hypertension, as well as a history of type 2 diabetes mellitus, hyperlipidemia, gout, and obstructive sleep apnea.  His blood pressure today has improved with the slight further titration of his Cardura to 2 mg daily.  He continues to take lisinopril 40 mg, labetalol 300 mg twice a day, in addition to amlodipine 10 mg.  He has renal insufficiency and in June his creatinine was 2.17.  He has tolerated simvastatin with excellent lipid panel in June with an LDL of 48 and total cholesterol 103.  Triglycerides are 126.  She is to use his BiPAP therapy with 100% compliance and states he is averaging at least 7 hours per night.  He continues to be moderately obese with a body mass index of approximately 35.  I have suggested at least a 30 pound weight loss.  Up with Dr. Posey Pronto for his renal function.  He has been exercising minimally, only 2 days per week, typically on a treadmill or stationary bike.  I have  suggested he increase this to at least 5 days per week for a minimum of 30 minutes.  I will see him in 6 months for cardiology follow-up evaluation.   Troy Sine, MD, [facility] 08/30/2015 5:51 PM

## 2015-09-08 ENCOUNTER — Other Ambulatory Visit: Payer: Self-pay | Admitting: Family

## 2015-10-20 ENCOUNTER — Other Ambulatory Visit: Payer: Self-pay | Admitting: Family Medicine

## 2015-10-26 ENCOUNTER — Telehealth: Payer: Self-pay | Admitting: Family

## 2015-10-26 NOTE — Telephone Encounter (Signed)
Pt not do for refill

## 2015-10-26 NOTE — Telephone Encounter (Signed)
Pt request refill of the following: colchicine 0.6 MG tablet   Phamacy:  [facility]  [place]

## 2015-10-31 ENCOUNTER — Other Ambulatory Visit: Payer: Self-pay | Admitting: Family

## 2015-11-03 ENCOUNTER — Other Ambulatory Visit: Payer: Self-pay | Admitting: Family

## 2015-11-19 ENCOUNTER — Other Ambulatory Visit: Payer: Self-pay | Admitting: Family

## 2015-11-19 MED ORDER — SIMVASTATIN 20 MG PO TABS: ORAL_TABLET | ORAL | Status: DC

## 2016-01-08 ENCOUNTER — Other Ambulatory Visit: Payer: Self-pay | Admitting: Family

## 2016-01-22 ENCOUNTER — Other Ambulatory Visit: Payer: Self-pay | Admitting: Family

## 2016-02-05 ENCOUNTER — Other Ambulatory Visit: Payer: Self-pay | Admitting: Family Medicine

## 2016-02-14 ENCOUNTER — Other Ambulatory Visit: Payer: Self-pay | Admitting: Family

## 2016-03-10 ENCOUNTER — Other Ambulatory Visit: Payer: Self-pay | Admitting: Family Medicine

## 2016-03-12 ENCOUNTER — Other Ambulatory Visit: Payer: Self-pay | Admitting: Family

## 2016-03-27 ENCOUNTER — Other Ambulatory Visit: Payer: Self-pay | Admitting: Family

## 2016-05-15 ENCOUNTER — Other Ambulatory Visit: Payer: Self-pay | Admitting: Family Medicine

## 2016-05-15 ENCOUNTER — Other Ambulatory Visit: Payer: Self-pay | Admitting: Family

## 2016-05-29 ENCOUNTER — Other Ambulatory Visit: Payer: Self-pay | Admitting: General Practice

## 2016-05-29 MED ORDER — AMLODIPINE BESYLATE 10 MG PO TABS: 10.0000 mg | ORAL_TABLET | Freq: Every day | ORAL | Status: DC

## 2016-05-29 NOTE — Telephone Encounter (Signed)
Ok to do 90 days until I see him

## 2016-05-29 NOTE — Telephone Encounter (Signed)
Ok to refill 

## 2016-05-29 NOTE — Telephone Encounter (Signed)
Pt to establish with [facility] on 06/02/16

## 2016-06-02 ENCOUNTER — Ambulatory Visit (INDEPENDENT_AMBULATORY_CARE_PROVIDER_SITE_OTHER): Payer: Medicare Other | Admitting: Adult Health

## 2016-06-02 ENCOUNTER — Encounter: Payer: Self-pay | Admitting: Adult Health

## 2016-06-02 VITALS — BP 160/80 | Temp 98.1°F | Wt 229.0 lb

## 2016-06-02 DIAGNOSIS — E1121 Type 2 diabetes mellitus with diabetic nephropathy: Secondary | ICD-10-CM | POA: Diagnosis not present

## 2016-06-02 DIAGNOSIS — I10 Essential (primary) hypertension: Secondary | ICD-10-CM | POA: Diagnosis not present

## 2016-06-02 DIAGNOSIS — Z23 Encounter for immunization: Secondary | ICD-10-CM | POA: Diagnosis not present

## 2016-06-02 DIAGNOSIS — Z7189 Other specified counseling: Secondary | ICD-10-CM

## 2016-06-02 DIAGNOSIS — Z1211 Encounter for screening for malignant neoplasm of colon: Secondary | ICD-10-CM | POA: Diagnosis not present

## 2016-06-02 DIAGNOSIS — Z7689 Persons encountering health services in other specified circumstances: Secondary | ICD-10-CM

## 2016-06-02 MED ORDER — LABETALOL HCL 200 MG PO TABS: 400.0000 mg | ORAL_TABLET | Freq: Two times a day (BID) | ORAL | Status: DC

## 2016-06-02 NOTE — Patient Instructions (Signed)
It was great meeting you today!  Someone will call yo to schedule your colonoscopy  Follow up with me in two weeks to see how your blood pressure is responding to the new dose of Labetalol . If you feel lightheaded or dizzy then go back to the original dose of Labetalol   Follow up with me for your physical   Increase exercise

## 2016-06-02 NOTE — Progress Notes (Signed)
Patient presents to clinic today to establish care. He is a pleasant AA male who  has a past medical history of CHRONIC GOUTY ARTHROPATHY WITH TOPHUS (11/08/2009); GOUT (07/26/2007); HYPERCHOLESTEROLEMIA, BORDERLINE (01/16/2010); HYPERTENSION (07/26/2007); OSTEOARTHRITIS (09/28/2007); PLANTAR FASCIITIS, BILATERAL (04/19/2010); PROSTATITIS, RECURRENT (11/08/2009); RENAL INSUFFICIENCY (07/26/2007); Unspecified disorder of lipoid metabolism (11/08/2009); URINARY FREQUENCY, CHRONIC (03/10/2008); Stroke [facility]) (01/21/2013); and Diabetes mellitus type 2 in obese ([place]).   His last physical was June of 2016 with Dr. Claiborne Billings.   He last saw primary care in 10/2014  Acute Concerns: Establish Care  Chronic Issues: Hypertension  - He does not monitor at home. Took blood pressure medication this morning. When he saw Dr. Claiborne Billings in September 2016, he increased Cardura to 2 mg. His BP in the office today is 160/80. He reports " that is good for me."   Diabetes -  He feels as though this is well controlled on current medication   Health Maintenance: Dental -- Twice a year  Vision -- every two years Immunizations -- UTD Colonoscopy -- 2000  Diet: Tries to eat healthy. Does not eat fast food.  Exercise: Once or twice a week with stationary bike or elliptical.   Is followed by  - Cardiology  - Nephrology   Wt Readings from Last 3 Encounters:  06/02/16 229 lb (103.874 kg)  08/30/15 229 lb 14.4 oz (104.282 kg)  07/11/15 225 lb (102.059 kg)    Past Medical History  Diagnosis Date  . 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  . GOUT 07/26/2007  . HYPERCHOLESTEROLEMIA, BORDERLINE 01/16/2010  . HYPERTENSION 07/26/2007  . OSTEOARTHRITIS 09/28/2007  . PLANTAR FASCIITIS, BILATERAL 04/19/2010  . PROSTATITIS, RECURRENT 11/08/2009  . RENAL INSUFFICIENCY 07/26/2007  . 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 URINARY FREQUENCY, CHRONIC 03/10/2008  . Stroke [facility]) 01/21/2013    Past Surgical History    Procedure Laterality Date  . Rt arm surgery for gout      Current Outpatient Prescriptions on File Prior to Visit  Medication Sig Dispense Refill  . allopurinol (ZYLOPRIM) 100 MG tablet Take 1 tablet by mouth  daily 90 tablet 1  . amLODipine (NORVASC) 10 MG tablet Take 1 tablet (10 mg total) by mouth daily. 30 tablet 0  . aspirin EC 325 MG tablet Take 1 tablet (325 mg total) by mouth daily.  0  . colchicine 0.6 MG tablet Take 0.6 mg by mouth daily.  11  . labetalol (NORMODYNE) 300 MG tablet Take 1 tablet by mouth two  times daily 180 tablet 0  . lisinopril (PRINIVIL,ZESTRIL) 40 MG tablet Take 1 tablet by mouth  daily 90 tablet 1  . metFORMIN (GLUCOPHAGE) 500 MG tablet TAKE 1 TABLET BY MOUTH TWICE A DAY WITH A MEAL 180 tablet 1  . simvastatin (ZOCOR) 20 MG tablet TAKE 1 TABLET BY MOUTH EVERY DAY AT 6 PM 90 tablet 0   No current facility-administered medications on file prior to visit.    Allergies  Allergen Reactions  . Other Shortness Of Breath and Other (See Comments)    Strawberries     Family History  Problem Relation Age of Onset  . Heart disease Mother   . Heart disease Father   . CVA Father   . Diabetes Sister     Social History   Social History  . Marital Status: Married    Spouse Name: Stanton Kidney  . Number of Children: 5  . Years of Education: 12   Occupational History  . TESTOR  retired   Social History Main Topics  . Smoking status: Never Smoker   . Smokeless tobacco: Never Used  . Alcohol Use: No     Comment: none  . Drug Use: No  . Sexual Activity: No   Other Topics Concern  . Not on file   Social History Narrative   Patient is right handed, resides in home with wife    Review of Systems  Constitutional: Negative.   HENT: Negative.   Eyes: Negative.   Cardiovascular: Negative.   Gastrointestinal: Negative.   Genitourinary: Negative.   Neurological: Negative.     BP 160/80 mmHg  Temp(Src) 98.1 F (36.7 C) (Oral)  Wt 229 lb (103.874  kg)  Physical Exam  Constitutional: He is oriented to person, place, and time and well-developed, well-nourished, and in no distress. No distress.  HENT:  Head: Normocephalic and atraumatic.  Right Ear: External ear normal.  Left Ear: External ear normal.  Nose: Nose normal.  Mouth/Throat: Oropharynx is clear and moist. No oropharyngeal exudate.  Eyes: Conjunctivae and EOM are normal. Pupils are equal, round, and reactive to light. Right eye exhibits no discharge. Left eye exhibits no discharge.  Cardiovascular: Normal rate, regular rhythm, normal heart sounds and intact distal pulses.  Exam reveals no gallop and no friction rub.   No murmur heard. Pulmonary/Chest: Effort normal and breath sounds normal. No respiratory distress. He has no wheezes. He has no rales. He exhibits no tenderness.  Neurological: He is alert and oriented to person, place, and time. Gait normal. GCS score is 15.  Skin: Skin is warm and dry. No rash noted. He is not diaphoretic. No erythema. No pallor.  Psychiatric: Memory, affect and judgment normal.  Nursing note and vitals reviewed.   Assessment/Plan: 1. Encounter to establish care - Follow up for physical  - He needs to lose about 30 pounds.  - Increase aerobic exercise to 5 times a week for atleast 45 minutes at time - Continue to work on a healthy diet  2. Colon cancer screening - referral to GI for colonoscopy   3. Essential hypertension - Poorly controlled - Increase Labetalol from 300mg  to 400mg  BID - Follow up in two weeks for recheck - Monitor at home   4. Type 2 diabetes mellitus with diabetic nephropathy, without long-term current use of insulin ([place]) -  Lab Results  Component Value Date   HGBA1C 5.8* 05/24/2015   Will check at physical  - Continue with metformin 500mg  BID Dorothyann Peng

## 2016-06-17 ENCOUNTER — Ambulatory Visit: Payer: Medicare Other | Admitting: Adult Health

## 2016-06-18 ENCOUNTER — Encounter: Payer: Self-pay | Admitting: Adult Health

## 2016-06-18 ENCOUNTER — Ambulatory Visit (INDEPENDENT_AMBULATORY_CARE_PROVIDER_SITE_OTHER): Payer: Medicare Other | Admitting: Adult Health

## 2016-06-18 VITALS — BP 150/74 | Temp 98.1°F | Ht 68.0 in | Wt 226.1 lb

## 2016-06-18 DIAGNOSIS — I10 Essential (primary) hypertension: Secondary | ICD-10-CM | POA: Diagnosis not present

## 2016-06-18 NOTE — Progress Notes (Signed)
Subjective:    Patient ID: Jared [name], male    DOB: [birth year], 70 y.o.   MRN: [id number]  HPI  70 year old male who presents today to the office for 2 week follow up regarding hypertension. At that time we increase Labetaolol form 300mg  to 400mg . He was asked to monitor his blood pressure at home.   Today in the office he reports that " my blood pressure is a lot better." He is not monitoring his blood pressures at home.   He denies any dizziness, lightheadedness or syncopal episodes.   He does report that he is working on weight reduction. He has cut back on his eating and is walking further.   Wt Readings from Last 3 Encounters:  06/18/16 226 lb 1.6 oz (102.558 kg)  06/02/16 229 lb (103.874 kg)  08/30/15 229 lb 14.4 oz (104.282 kg)     Review of Systems  Constitutional: Negative.   HENT: Negative.   Respiratory: Negative.   Cardiovascular: Negative.   Gastrointestinal: Negative.   Neurological: Negative.   All other systems reviewed and are negative.  Past Medical History  Diagnosis Date  . 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  . GOUT 07/26/2007  . HYPERCHOLESTEROLEMIA, BORDERLINE 01/16/2010  . HYPERTENSION 07/26/2007  . OSTEOARTHRITIS 09/28/2007  . PLANTAR FASCIITIS, BILATERAL 04/19/2010  . PROSTATITIS, RECURRENT 11/08/2009  . RENAL INSUFFICIENCY 07/26/2007  . 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 URINARY FREQUENCY, CHRONIC 03/10/2008  . Stroke ([place]) 01/21/2013  . Diabetes mellitus type 2 in obese [facility])     Social History   Social History  . Marital Status: Married    Spouse Name: Stanton Kidney  . Number of Children: 5  . Years of Education: 12   Occupational History  . TESTOR     retired   Social History Main Topics  . Smoking status: Never Smoker   . Smokeless tobacco: Never Used  . Alcohol Use: No     Comment: none  . Drug Use: No  . Sexual Activity: No   Other Topics Concern  . Not on file   Social History Narrative   He  has a part time job for delivering car parts   Married    4 children - All in [place]      He likes to go to car shows and shoot pool.     Past Surgical History  Procedure Laterality Date  . Rt arm surgery for gout      Family History  Problem Relation Age of Onset  . Heart disease Mother   . Heart disease Father   . CVA Father   . Diabetes Sister     Allergies  Allergen Reactions  . Other Shortness Of Breath and Other (See Comments)    Strawberries     Current Outpatient Prescriptions on File Prior to Visit  Medication Sig Dispense Refill  . allopurinol (ZYLOPRIM) 100 MG tablet Take 1 tablet by mouth  daily 90 tablet 1  . amLODipine (NORVASC) 10 MG tablet Take 1 tablet (10 mg total) by mouth daily. 30 tablet 0  . aspirin EC 325 MG tablet Take 1 tablet (325 mg total) by mouth daily.  0  . colchicine 0.6 MG tablet Take 0.6 mg by mouth daily.  11  . labetalol (NORMODYNE) 200 MG tablet Take 2 tablets (400 mg total) by mouth 2 (two) times daily. 60 tablet 1  . lisinopril (PRINIVIL,ZESTRIL) 40 MG tablet Take 1 tablet by mouth  daily 90 tablet 1  . metFORMIN (GLUCOPHAGE) 500 MG tablet TAKE 1 TABLET BY MOUTH TWICE A DAY WITH A MEAL 180 tablet 1  . simvastatin (ZOCOR) 20 MG tablet TAKE 1 TABLET BY MOUTH EVERY DAY AT 6 PM 90 tablet 0   No current facility-administered medications on file prior to visit.    BP 150/74 mmHg  Temp(Src) 98.1 F (36.7 C) (Oral)  Ht 5\' 8"  (1.727 m)  Wt 226 lb 1.6 oz (102.558 kg)  BMI 34.39 kg/m2       Objective:   Physical Exam  Constitutional: He is oriented to person, place, and time. He appears well-developed and well-nourished. No distress.  Cardiovascular: Normal rate, regular rhythm, normal heart sounds and intact distal pulses.  Exam reveals no gallop and no friction rub.   No murmur heard. Pulmonary/Chest: Effort normal and breath sounds normal. No respiratory distress. He has no wheezes. He has no rales. He exhibits no tenderness.    Neurological: He is alert and oriented to person, place, and time.  Skin: Skin is warm and dry. No rash noted. He is not diaphoretic. No erythema. No pallor.  Psychiatric: He has a normal mood and affect. His behavior is normal. Judgment and thought content normal.  Nursing note and vitals reviewed.     Assessment & Plan:  1. Essential hypertension - His blood pressure is still elevated. He is reluctant to go on any additional medications at this time.  - He is going to join a gym and follow up in 1 month - Advised to check BP at home and bring log to next visit.  - Follow up sooner if needed  Dorothyann Peng, NP

## 2016-06-18 NOTE — Patient Instructions (Signed)
It was great seeing you again.   Join a gym and start working out. I will see you in one month

## 2016-06-19 ENCOUNTER — Other Ambulatory Visit: Payer: Self-pay | Admitting: Family Medicine

## 2016-06-19 NOTE — Telephone Encounter (Signed)
Left message for patient to return phone call.  

## 2016-06-30 ENCOUNTER — Other Ambulatory Visit: Payer: Self-pay | Admitting: Family

## 2016-07-01 NOTE — Telephone Encounter (Signed)
Rx refill sent to pharmacy. 

## 2016-07-02 ENCOUNTER — Encounter: Payer: Self-pay | Admitting: Adult Health

## 2016-07-02 ENCOUNTER — Other Ambulatory Visit: Payer: Self-pay | Admitting: Adult Health

## 2016-07-02 NOTE — Telephone Encounter (Signed)
Ok to refill 

## 2016-07-02 NOTE — Telephone Encounter (Signed)
Ok to refill, 90 tabs with 3 refills

## 2016-07-16 ENCOUNTER — Ambulatory Visit: Payer: Medicare Other | Admitting: Adult Health

## 2016-07-17 ENCOUNTER — Other Ambulatory Visit: Payer: Self-pay | Admitting: Family

## 2016-07-18 NOTE — Telephone Encounter (Signed)
I cannot fill this medication without an office visit if it has been over a year since it was last prescribed

## 2016-07-18 NOTE — Telephone Encounter (Signed)
I looked through previous office notes  and do not see this on patient's medication list. I looked through historic meds and it looks like this medication was last prescribed 07/11/15 for #30 - prescribed by [place]. Ok to refill?

## 2016-07-29 ENCOUNTER — Other Ambulatory Visit: Payer: Self-pay | Admitting: Family Medicine

## 2016-07-29 MED ORDER — LISINOPRIL 40 MG PO TABS: 40.0000 mg | ORAL_TABLET | Freq: Every day | ORAL | 0 refills | Status: DC

## 2016-07-29 NOTE — Telephone Encounter (Signed)
Refill request for Lisinopril 40 mg and a [facility] [address], I sent script e-scribe.

## 2016-08-13 ENCOUNTER — Other Ambulatory Visit: Payer: Self-pay | Admitting: Adult Health

## 2016-09-16 ENCOUNTER — Other Ambulatory Visit: Payer: Self-pay | Admitting: Adult Health

## 2016-09-17 NOTE — Telephone Encounter (Signed)
Ok to refill for 6 months 

## 2016-09-18 ENCOUNTER — Other Ambulatory Visit: Payer: Self-pay | Admitting: Adult Health

## 2016-09-18 NOTE — Telephone Encounter (Signed)
Patient states that he would like to go back on Labetalol 300mg  instead of 200mg . Please advise.

## 2016-09-18 NOTE — Telephone Encounter (Signed)
He should be taking a total of 400mg . He needs to be evaluated. Bring blood pressure cuff and log to next visit.

## 2016-09-18 NOTE — Telephone Encounter (Signed)
Patient states he will continue with the 400 mg daily. Patient states he will call back to make an appt

## 2016-10-14 ENCOUNTER — Other Ambulatory Visit: Payer: Self-pay | Admitting: Adult Health

## 2016-10-24 ENCOUNTER — Other Ambulatory Visit: Payer: Self-pay | Admitting: Adult Health

## 2016-11-19 ENCOUNTER — Other Ambulatory Visit: Payer: Self-pay | Admitting: Adult Health

## 2016-11-24 ENCOUNTER — Other Ambulatory Visit: Payer: Self-pay

## 2016-11-24 ENCOUNTER — Telehealth: Payer: Self-pay | Admitting: Adult Health

## 2016-11-24 MED ORDER — METFORMIN HCL 500 MG PO TABS: ORAL_TABLET | ORAL | 1 refills | Status: DC

## 2016-11-24 MED ORDER — SIMVASTATIN 20 MG PO TABS: ORAL_TABLET | ORAL | 0 refills | Status: DC

## 2016-11-24 NOTE — Telephone Encounter (Signed)
Rx sent 

## 2016-11-24 NOTE — Telephone Encounter (Signed)
Pt needs refill on metformin

## 2017-01-10 ENCOUNTER — Other Ambulatory Visit: Payer: Self-pay | Admitting: Adult Health

## 2017-01-12 NOTE — Telephone Encounter (Signed)
Ok to refill for one year  

## 2017-01-16 ENCOUNTER — Other Ambulatory Visit: Payer: Self-pay | Admitting: Adult Health

## 2017-01-19 NOTE — Telephone Encounter (Signed)
Ok to refill for 90 days  

## 2017-02-14 ENCOUNTER — Other Ambulatory Visit: Payer: Self-pay | Admitting: Adult Health

## 2017-02-22 ENCOUNTER — Other Ambulatory Visit: Payer: Self-pay | Admitting: Adult Health

## 2017-05-15 ENCOUNTER — Other Ambulatory Visit: Payer: Self-pay | Admitting: Adult Health

## 2017-05-15 NOTE — Telephone Encounter (Signed)
[place] for 30 + 1. He needs an office visit for CPE

## 2017-05-20 ENCOUNTER — Other Ambulatory Visit: Payer: Self-pay | Admitting: Adult Health

## 2017-05-20 NOTE — Telephone Encounter (Signed)
Ok to refill for 90 days. He is due for CPE

## 2017-05-20 NOTE — Telephone Encounter (Signed)
This medication was last prescribed 11/19/16 for #90 with 0 refills. Ok to refill?

## 2017-06-11 DIAGNOSIS — N2581 Secondary hyperparathyroidism of renal origin: Secondary | ICD-10-CM | POA: Diagnosis not present

## 2017-06-11 DIAGNOSIS — N183 Chronic kidney disease, stage 3 (moderate): Secondary | ICD-10-CM | POA: Diagnosis not present

## 2017-06-11 LAB — BASIC METABOLIC PANEL
BUN: 28 — AB (ref 4–21)
Creatinine: 2.4 — AB (ref 0.6–1.3)
GLUCOSE: 80
POTASSIUM: 4.5 (ref 3.4–5.3)
SODIUM: 139 (ref 137–147)

## 2017-06-11 LAB — CBC AND DIFFERENTIAL
HCT: 39 — AB (ref 41–53)
HEMOGLOBIN: 13.8 (ref 13.5–17.5)
Platelets: 204 (ref 150–399)
WBC: 5.9

## 2017-06-12 DIAGNOSIS — N2581 Secondary hyperparathyroidism of renal origin: Secondary | ICD-10-CM | POA: Diagnosis not present

## 2017-06-12 DIAGNOSIS — N183 Chronic kidney disease, stage 3 (moderate): Secondary | ICD-10-CM | POA: Diagnosis not present

## 2017-06-12 DIAGNOSIS — D631 Anemia in chronic kidney disease: Secondary | ICD-10-CM | POA: Diagnosis not present

## 2017-06-12 DIAGNOSIS — I129 Hypertensive chronic kidney disease with stage 1 through stage 4 chronic kidney disease, or unspecified chronic kidney disease: Secondary | ICD-10-CM | POA: Diagnosis not present

## 2017-06-16 ENCOUNTER — Encounter: Payer: Self-pay | Admitting: Family Medicine

## 2017-06-20 ENCOUNTER — Other Ambulatory Visit: Payer: Self-pay | Admitting: Adult Health

## 2017-06-21 ENCOUNTER — Other Ambulatory Visit: Payer: Self-pay | Admitting: Adult Health

## 2017-06-22 NOTE — Telephone Encounter (Signed)
Please advise.  Patient hasn't been seen in a year.

## 2017-06-23 NOTE — Telephone Encounter (Signed)
He can have thirty days but please advise he needs to follow up for a physical for any other refills

## 2017-07-13 ENCOUNTER — Other Ambulatory Visit: Payer: Self-pay | Admitting: Adult Health

## 2017-07-14 NOTE — Telephone Encounter (Signed)
Filled for 30 days.  Pt scheduled to see Tommi Rumps for CPX on 08/05/17.  Pt notified to come fasting.

## 2017-07-22 ENCOUNTER — Other Ambulatory Visit: Payer: Self-pay | Admitting: Adult Health

## 2017-07-22 NOTE — Telephone Encounter (Signed)
Sent to the pharmacy by e-scribe for 90 days.  Pt is scheduled to see Tommi Rumps for yearly on 08/05/17.

## 2017-08-05 ENCOUNTER — Encounter: Payer: Self-pay | Admitting: Internal Medicine

## 2017-08-05 ENCOUNTER — Encounter: Payer: Self-pay | Admitting: Adult Health

## 2017-08-05 ENCOUNTER — Ambulatory Visit (INDEPENDENT_AMBULATORY_CARE_PROVIDER_SITE_OTHER): Payer: Medicare Other | Admitting: Adult Health

## 2017-08-05 VITALS — BP 132/84 | Temp 98.7°F | Ht 68.0 in | Wt 218.0 lb

## 2017-08-05 DIAGNOSIS — E1121 Type 2 diabetes mellitus with diabetic nephropathy: Secondary | ICD-10-CM | POA: Diagnosis not present

## 2017-08-05 DIAGNOSIS — Z1159 Encounter for screening for other viral diseases: Secondary | ICD-10-CM | POA: Diagnosis not present

## 2017-08-05 DIAGNOSIS — Z0001 Encounter for general adult medical examination with abnormal findings: Secondary | ICD-10-CM

## 2017-08-05 DIAGNOSIS — I10 Essential (primary) hypertension: Secondary | ICD-10-CM

## 2017-08-05 DIAGNOSIS — Z125 Encounter for screening for malignant neoplasm of prostate: Secondary | ICD-10-CM

## 2017-08-05 DIAGNOSIS — Z0000 Encounter for general adult medical examination without abnormal findings: Secondary | ICD-10-CM

## 2017-08-05 DIAGNOSIS — R1310 Dysphagia, unspecified: Secondary | ICD-10-CM

## 2017-08-05 DIAGNOSIS — E782 Mixed hyperlipidemia: Secondary | ICD-10-CM

## 2017-08-05 LAB — CBC WITH DIFFERENTIAL/PLATELET
BASOS ABS: 0.1 10*3/uL (ref 0.0–0.1)
Basophils Relative: 1.7 % (ref 0.0–3.0)
EOS ABS: 0.4 10*3/uL (ref 0.0–0.7)
Eosinophils Relative: 7.4 % — ABNORMAL HIGH (ref 0.0–5.0)
HEMATOCRIT: 40 % (ref 39.0–52.0)
Hemoglobin: 13.7 g/dL (ref 13.0–17.0)
LYMPHS PCT: 25.8 % (ref 12.0–46.0)
Lymphs Abs: 1.2 10*3/uL (ref 0.7–4.0)
MCHC: 34.3 g/dL (ref 30.0–36.0)
MCV: 89.1 fl (ref 78.0–100.0)
Monocytes Absolute: 0.6 10*3/uL (ref 0.1–1.0)
Monocytes Relative: 11.4 % (ref 3.0–12.0)
NEUTROS ABS: 2.6 10*3/uL (ref 1.4–7.7)
NEUTROS PCT: 53.7 % (ref 43.0–77.0)
PLATELETS: 219 10*3/uL (ref 150.0–400.0)
RBC: 4.49 Mil/uL (ref 4.22–5.81)
RDW: 13 % (ref 11.5–15.5)
WBC: 4.8 10*3/uL (ref 4.0–10.5)

## 2017-08-05 LAB — HEPATIC FUNCTION PANEL
ALK PHOS: 54 U/L (ref 39–117)
ALT: 40 U/L (ref 0–53)
AST: 35 U/L (ref 0–37)
Albumin: 4.5 g/dL (ref 3.5–5.2)
BILIRUBIN DIRECT: 0.1 mg/dL (ref 0.0–0.3)
BILIRUBIN TOTAL: 0.6 mg/dL (ref 0.2–1.2)
Total Protein: 6.7 g/dL (ref 6.0–8.3)

## 2017-08-05 LAB — BASIC METABOLIC PANEL
BUN: 20 mg/dL (ref 6–23)
CO2: 27 mEq/L (ref 19–32)
Calcium: 9.6 mg/dL (ref 8.4–10.5)
Chloride: 106 mEq/L (ref 96–112)
Creatinine, Ser: 1.78 mg/dL — ABNORMAL HIGH (ref 0.40–1.50)
GFR: 49.01 mL/min — AB (ref >60.00)
GLUCOSE: 104 mg/dL — AB (ref 70–99)
Potassium: 4.2 mEq/L (ref 3.5–5.1)
SODIUM: 141 meq/L (ref 135–145)

## 2017-08-05 LAB — TSH: TSH: 1.17 u[IU]/mL (ref 0.35–4.50)

## 2017-08-05 LAB — LIPID PANEL
CHOL/HDL RATIO: 5
Cholesterol: 153 mg/dL (ref 0–200)
HDL: 31.5 mg/dL — AB (ref >39.00)
LDL CALC: 82 mg/dL (ref 0–99)
NonHDL: 121.35
TRIGLYCERIDES: 199 mg/dL — AB (ref 0.0–149.0)
VLDL: 39.8 mg/dL (ref 0.0–40.0)

## 2017-08-05 LAB — PSA: PSA: 1.44 ng/mL (ref 0.10–4.00)

## 2017-08-05 LAB — HEMOGLOBIN A1C: Hgb A1c MFr Bld: 5.5 % (ref 4.6–6.5)

## 2017-08-05 NOTE — Progress Notes (Signed)
Subjective:    Patient ID: Jared [name], male    DOB: 17-May-1946, 71 y.o.   MRN: [id number]  HPI  Patient presents for yearly preventative medicine examination. 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  All immunizations and health maintenance protocols were reviewed with the patient and needed orders were placed.  Appropriate screening laboratory values were ordered for the patient including screening of hyperlipidemia, renal function and hepatic function. If indicated by BPH, a PSA was ordered.  Medication reconciliation,  past medical history, social history, problem list and allergies were reviewed in detail with the patient  Goals were established with regard to weight loss, exercise, and  diet in compliance with medications. He is working on weight loss through exercise.  Wt Readings from Last 3 Encounters:  08/05/17 218 lb (98.9 kg)  06/18/16 226 lb 1.6 oz (102.6 kg)  06/02/16 229 lb (103.9 kg)    End of life planning was discussed.  He currently takes Metformin 500 mg BID for diabetes control. His last A1c was 5.8 in June 2016.   He currently takes Lisinopril 40 mg, Labetalol 400 mg BID, and Norvasc 10 mg daily for hypertension control   He takes Simvastatin 20 mg for hyperlipidemia   He also takes Allopurinol 200mg  for gout.   His last colonoscopy was in 2000. He is up to date on his dental and vision care   He has not seen Cardiology since September 2016   He reports that he last saw Nephrology 4 weeks  ago.   Wt Readings from Last 3 Encounters:  08/05/17 218 lb (98.9 kg)  06/18/16 226 lb 1.6 oz (102.6 kg)  06/02/16 229 lb (103.9 kg)   He reports that he has been experiencing dysphagia for over one year. He reports that about 50% of the time he will feel as though food will get stuck in his throat. He has had episodes of vomiting due to dysphagia. He does not feel like he is choking.   Review of Systems  Constitutional: Negative.   HENT: Negative.   Eyes: Negative.   Respiratory: Negative.   Cardiovascular: Negative.   Gastrointestinal: Negative.   Endocrine: Negative.   Genitourinary: Negative.   Musculoskeletal: Negative.   Skin: Negative.   Allergic/Immunologic: Negative.   Neurological: Negative.   Hematological: Negative.   Psychiatric/Behavioral: Negative.   All other systems reviewed and are negative.  Past Medical History:  Diagnosis Date  . 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  . Diabetes mellitus type 2 in obese ([place])   . GOUT 07/26/2007  . HYPERCHOLESTEROLEMIA, BORDERLINE 01/16/2010  . HYPERTENSION 07/26/2007  . OSTEOARTHRITIS 09/28/2007  . PLANTAR FASCIITIS, BILATERAL 04/19/2010  . PROSTATITIS, RECURRENT 11/08/2009  . RENAL INSUFFICIENCY 07/26/2007  . Stroke ([place]) 01/21/2013  . 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 URINARY FREQUENCY, CHRONIC 03/10/2008    Social History   Social History  . Marital status: Married    Spouse name: [name]  . Number of children: 5  . Years of education: 12   Occupational History  . [employer]    retired   Social History Main Topics  . Smoking status: Never Smoker  .  Smokeless tobacco: Never Used  . Alcohol use No     Comment: none  . Drug use: No  . Sexual activity: No   Other Topics Concern  . Not on file   Social History Narrative   He has a part time job for delivering car parts   Married    4 children - All in [place]      He likes to go to car shows and shoot pool.     Past  Surgical History:  Procedure Laterality Date  . rt arm surgery for gout      Family History  Problem Relation Age of Onset  . Heart disease Mother   . Heart disease Father   . CVA Father   . Diabetes Sister     Allergies  Allergen Reactions  . Other Shortness Of Breath and Other (See Comments)    Strawberries     Current Outpatient Prescriptions on File Prior to Visit  Medication Sig Dispense Refill  . allopurinol (ZYLOPRIM) 100 MG tablet TAKE 1 TABLET BY MOUTH EVERY DAY 90 tablet 1  . amLODipine (NORVASC) 10 MG tablet TAKE 1 TABLET BY MOUTH EVERY DAY 90 tablet 3  . aspirin EC 325 MG tablet Take 1 tablet (325 mg total) by mouth daily.  0  . colchicine 0.6 MG tablet Take 0.6 mg by mouth daily.  11  . labetalol (NORMODYNE) 200 MG tablet TAKE 2 TABLETS BY MOUTH TWICE A DAY 120 tablet 0  . lisinopril (PRINIVIL,ZESTRIL) 40 MG tablet TAKE 1 TABLET BY MOUTH EVERY DAY 90 tablet 0  . metFORMIN (GLUCOPHAGE) 500 MG tablet TAKE 1 TABLET BY MOUTH TWICE A DAY WITH A MEAL 180 tablet 0  . simvastatin (ZOCOR) 20 MG tablet TAKE 1 TABLET BY MOUTH EVERY DAY AT 6 PM 30 tablet 0   No current facility-administered medications on file prior to visit.     BP 132/84 (BP Location: Left Arm)   Temp 98.7 F (37.1 C) (Oral)   Ht 5\' 8"  (1.727 m)   Wt 218 lb (98.9 kg)   BMI 33.15 kg/m       Objective:   Physical Exam  Constitutional: He is oriented to person, place, and time. He appears well-developed and well-nourished. No distress.  Obese   HENT:  Head: Normocephalic and atraumatic.  Right Ear: External ear normal.  Left Ear: External ear normal.  Nose: Nose normal.  Mouth/Throat: Oropharynx is clear and moist. No oropharyngeal exudate.  Eyes: Pupils are equal, round, and reactive to light. Conjunctivae and EOM are normal. Right eye exhibits no discharge. Left eye exhibits no discharge. No scleral icterus.  Neck: Normal range of motion. No JVD present. No tracheal deviation present. No  thyromegaly present.  Cardiovascular: Normal rate, regular rhythm, normal heart sounds and intact distal pulses.  Exam reveals no gallop and no friction rub.   No murmur heard. Pulmonary/Chest: Effort normal and breath sounds normal. No stridor. No respiratory distress. He has no wheezes. He has no rales. He exhibits no tenderness.  Abdominal: Soft. Bowel sounds are normal. He exhibits no distension and no mass. There is no tenderness. There is no rebound and no guarding.  Genitourinary:  Genitourinary Comments: Refused   Musculoskeletal: Normal range of motion. He exhibits no edema, tenderness or deformity.  Lymphadenopathy:    He has no cervical adenopathy.  Neurological: He is alert and oriented to person, place, and time. He has normal reflexes. He displays normal reflexes. No cranial nerve deficit.  He exhibits normal muscle tone. Coordination normal.  Skin: Skin is warm and dry. No rash noted. He is not diaphoretic. No erythema. No pallor.  Psychiatric: He has a normal mood and affect. His behavior is normal. Judgment and thought content normal.  Nursing note and vitals reviewed.     Assessment & Plan:  1. Routine general medical examination at a health care facility - Educated on the importance of following a diabetic diet.  - Follow up in one year for next CPE  - Basic metabolic panel - CBC with Differential/Platelet - Hemoglobin A1c - Hepatic function panel - Lipid panel - TSH - PSA  2. Mixed hyperlipidemia - Consider increasing statin  - Basic metabolic panel - CBC with Differential/Platelet - Hemoglobin A1c - Hepatic function panel - Lipid panel - TSH - PSA  3. Type 2 diabetes mellitus with diabetic nephropathy, without long-term current use of insulin (HCC) - Advised to monitor blood sugars at home.  - Consider increasing metformin  - Basic metabolic panel - CBC with Differential/Platelet - Hemoglobin A1c - Hepatic function panel - Lipid panel - TSH - PSA -  Follow up in 3 months  4. Essential hypertension - Well controlled currently  - No change in medication at this point in time  - Basic metabolic panel - CBC with Differential/Platelet - Hemoglobin A1c - Hepatic function panel - Lipid panel - TSH - PSA  5. Need for hepatitis C screening test  - Hep C Antibody  6. Dysphagia, unspecified type  - Ambulatory referral to Gastroenterology  Dorothyann Peng, NP

## 2017-08-06 LAB — HEPATITIS C ANTIBODY: HCV Ab: NONREACTIVE

## 2017-08-18 ENCOUNTER — Other Ambulatory Visit: Payer: Self-pay | Admitting: Adult Health

## 2017-08-19 NOTE — Telephone Encounter (Signed)
Sent to the pharmacy by e-scribe. 

## 2017-08-19 NOTE — Telephone Encounter (Signed)
Pt need meds today. Pt has been out for 2 days

## 2017-08-31 ENCOUNTER — Other Ambulatory Visit: Payer: Self-pay | Admitting: Adult Health

## 2017-09-11 ENCOUNTER — Encounter: Payer: Self-pay | Admitting: Adult Health

## 2017-09-30 ENCOUNTER — Encounter: Payer: Self-pay | Admitting: Internal Medicine

## 2017-09-30 ENCOUNTER — Ambulatory Visit (INDEPENDENT_AMBULATORY_CARE_PROVIDER_SITE_OTHER): Payer: Medicare Other | Admitting: Internal Medicine

## 2017-09-30 VITALS — BP 132/84 | HR 68 | Ht 68.0 in | Wt 216.0 lb

## 2017-09-30 DIAGNOSIS — R1310 Dysphagia, unspecified: Secondary | ICD-10-CM

## 2017-09-30 DIAGNOSIS — Z1211 Encounter for screening for malignant neoplasm of colon: Secondary | ICD-10-CM | POA: Diagnosis not present

## 2017-09-30 DIAGNOSIS — K219 Gastro-esophageal reflux disease without esophagitis: Secondary | ICD-10-CM

## 2017-09-30 NOTE — Patient Instructions (Signed)

## 2017-09-30 NOTE — Progress Notes (Signed)
HISTORY OF PRESENT ILLNESS:  Jared [name] is a 71 y.o. male with past medical history as listed below who is sent today by his primary care provider [name] with a chief complaint of dysphagia. The patient reports an 18 month history of intermittent solid food dysphagia. Occurring twice per week. Often needs to regurgitate food for relief. He does have indigestion and heartburn symptoms. He is not on PPI. He did undergo upper endoscopy for complaints of dysphagia with [name] in January 2008. Patient was noted to have lower esophageal ring which was disrupted with the passage of the endoscope. Patient has also undergone colonoscopy remotely with [name] of [place] in 2000. The examination revealed pandiverticulosis was otherwise negative. He has not had any form of colon cancer screening since. Patient's GI review of systems is otherwise negative  REVIEW OF SYSTEMS:  All non-GI ROS negative except for night sweats, arthritis, urinary frequency  Past Medical History:  Diagnosis Date  . 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  . Diabetes mellitus type 2 in obese ([place])   . GOUT 07/26/2007  . HYPERCHOLESTEROLEMIA, BORDERLINE 01/16/2010  . HYPERTENSION 07/26/2007  . OSTEOARTHRITIS 09/28/2007  . PLANTAR FASCIITIS, BILATERAL 04/19/2010  . PROSTATITIS, RECURRENT 11/08/2009  . RENAL INSUFFICIENCY 07/26/2007  . Stroke ([place]) 01/21/2013  . 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 URINARY FREQUENCY, CHRONIC 03/10/2008    Past Surgical History:  Procedure Laterality Date  . rt arm surgery for gout      Social History [name]  reports that he has never smoked. He has never used smokeless tobacco. He reports that he does not drink alcohol or use drugs.  family history includes CVA in his father; Diabetes in his sister; Heart disease in his father and mother.  Allergies  Allergen Reactions  . Other Shortness Of Breath and Other (See Comments)   Strawberries        PHYSICAL EXAMINATION: Vital signs: BP 132/84   Pulse 68   Ht 5\' 8"  (1.727 m)   Wt 216 lb (98 kg)   BMI 32.84 kg/m   Constitutional: generally well-appearing, no acute distress Psychiatric: alert and oriented x3, cooperative Eyes: extraocular movements intact, anicteric, conjunctiva pink Mouth: oral pharynx moist, no lesions Neck: suppleNo thyromegaly Lymph: no supraclavicular lymphadenopathy Cardiovascular: heart regular rate and rhythm, no murmur Lungs: clear to auscultation bilaterally Abdomen: soft, nontender, nondistended, no obvious ascites, no peritoneal signs, normal bowel sounds, no organomegaly Rectal:Ommitted Extremities: no clubbing cyanosis or lower extremity edema bilaterally Skin: no lesions on visible extremities Neuro: No focal deficits. Normal DTRs  ASSESSMENT:  #1. Intermittent solid food dysphagia. Suspect distal esophageal stricture or ring. Possibly peptic #2. GERD by history. Not on therapy #3. Colon cancer screening. Colonoscopy 2000 with diverticulosis. Overdue for follow-up. We discussed this in detail   PLAN:  #1. Upper endoscopy with esophageal dilation.The nature of the procedure, as well as the risks, benefits, and alternatives were carefully and thoroughly reviewed with the patient. Ample time for discussion and questions allowed. The patient understood, was satisfied, and agreed to proceed. #2. I reviewed various colon cancer screening strategies with the patient. I provided supplemental literature. He will contemplate and/or discuss with his PCP. If he decided on optical colonoscopy, we are happy to assist in arranging the examination #3. The patient may need PPI pending the results of endoscopy  This consultation has been sent to his PCP

## 2017-10-10 ENCOUNTER — Other Ambulatory Visit: Payer: Self-pay | Admitting: Adult Health

## 2017-10-13 ENCOUNTER — Encounter: Payer: Self-pay | Admitting: Internal Medicine

## 2017-10-13 NOTE — Telephone Encounter (Signed)
Sent to the pharmacy by e-scribe. 

## 2017-10-20 ENCOUNTER — Other Ambulatory Visit: Payer: Self-pay | Admitting: Adult Health

## 2017-10-21 NOTE — Telephone Encounter (Signed)
Sent to the pharmacy by e-scribe. 

## 2017-10-27 ENCOUNTER — Ambulatory Visit (AMBULATORY_SURGERY_CENTER): Payer: Medicare Other | Admitting: Internal Medicine

## 2017-10-27 ENCOUNTER — Encounter: Payer: Self-pay | Admitting: Internal Medicine

## 2017-10-27 VITALS — BP 162/90 | HR 53 | Temp 98.0°F | Resp 19 | Ht 68.0 in | Wt 216.0 lb

## 2017-10-27 DIAGNOSIS — K2100 Gastro-esophageal reflux disease with esophagitis, without bleeding: Secondary | ICD-10-CM

## 2017-10-27 DIAGNOSIS — K222 Esophageal obstruction: Secondary | ICD-10-CM

## 2017-10-27 DIAGNOSIS — K2970 Gastritis, unspecified, without bleeding: Secondary | ICD-10-CM

## 2017-10-27 DIAGNOSIS — K2990 Gastroduodenitis, unspecified, without bleeding: Secondary | ICD-10-CM | POA: Diagnosis not present

## 2017-10-27 DIAGNOSIS — R1310 Dysphagia, unspecified: Secondary | ICD-10-CM

## 2017-10-27 MED ORDER — OMEPRAZOLE 40 MG PO CPDR: 40.0000 mg | DELAYED_RELEASE_CAPSULE | Freq: Every day | ORAL | 11 refills | Status: DC

## 2017-10-27 MED ORDER — SODIUM CHLORIDE 0.9 % IV SOLN: 500.0000 mL | INTRAVENOUS | Status: DC

## 2017-10-27 NOTE — Patient Instructions (Signed)
YOU HAD AN ENDOSCOPIC PROCEDURE TODAY AT [facility]:   Refer to the procedure report that was given to you for any specific questions about what was found during the examination.  If the procedure report does not answer your questions, please call your gastroenterologist to clarify.  If you requested that your care partner not be given the details of your procedure findings, then the procedure report has been included in a sealed envelope for you to review at your convenience later.  YOU SHOULD EXPECT: Some feelings of bloating in the abdomen. Passage of more gas than usual.  Walking can help get rid of the air that was put into your GI tract during the procedure and reduce the bloating.   Please Note:  You might notice some irritation and congestion in your nose or some drainage.  This is from the oxygen used during your procedure.  There is no need for concern and it should clear up in a day or so.  SYMPTOMS TO REPORT IMMEDIATELY:   Following upper endoscopy (EGD)  Vomiting of blood or coffee ground material  New chest pain or pain under the shoulder blades  Painful or persistently difficult swallowing  New shortness of breath  Fever of 100F or higher  Black, tarry-looking stools  For urgent or emergent issues, a gastroenterologist can be reached at any hour by calling [phone].   DIET:  We do recommend nothing by mouth until 530pm, then you may have clear liquids.  If they go down well, you may have a soft diet for the rest of today.  You may proceed to your regular diet tomorrow.  Drink plenty of fluids but you should avoid alcoholic beverages for 24 hours.  ACTIVITY:  You should plan to take it easy for the rest of today and you should NOT DRIVE or use heavy machinery until tomorrow (because of the sedation medicines used during the test).    FOLLOW UP: Our staff will call the number listed on your records the next business day following your procedure to check  on you and address any questions or concerns that you may have regarding the information given to you following your procedure. If we do not reach you, we will leave a message.  However, if you are feeling well and you are not experiencing any problems, there is no need to return our call.  We will assume that you have returned to your regular daily activities without incident.  If any biopsies were taken you will be contacted by phone or by letter within the next 1-3 weeks.  Please call us at [phone] if you have not heard about the biopsies in 3 weeks.    SIGNATURES/CONFIDENTIALITY: You and/or your care partner have signed paperwork which will be entered into your electronic medical record.  These signatures attest to the fact that that the information above on your After Visit Summary has been reviewed and is understood.  Full responsibility of the confidentiality of this discharge information lies with you and/or your care-partner.  Read all of the handouts given to you by your recovery room nurse.  Please take your new medication as ordered.

## 2017-10-27 NOTE — Progress Notes (Signed)
Dental advisory given to patient 

## 2017-10-27 NOTE — Progress Notes (Signed)
A/ox3 pleased with MAC, report to Suzanne RN 

## 2017-10-27 NOTE — Progress Notes (Signed)
Have not been outside [place] in last 21 days.

## 2017-10-27 NOTE — Progress Notes (Signed)
Called to room to assist during endoscopic procedure.  Patient ID and intended procedure confirmed with present staff. Received instructions for my participation in the procedure from the performing physician.  

## 2017-10-27 NOTE — Op Note (Signed)
[facility] Patient Name: Jared [name] Procedure Date: 10/27/2017 3:37 PM MRN: [id number] Endoscopist: Docia Chuck. [name] , MD Age: 71 Referring MD:  Date of Birth: 12-20-1946 Gender: Male Account #: [id number] Procedure:                Upper GI endoscopy, with biopsies, with balloon                            dilation of esophagus Indications:              Dysphagia Medicines:                Monitored Anesthesia Care Procedure:                Pre-Anesthesia Assessment:                           - Prior to the procedure, a History and Physical                            was performed, and patient medications and                            allergies were reviewed. The patient's tolerance of                            previous anesthesia was also reviewed. The risks                            and benefits of the procedure and the sedation                            options and risks were discussed with the patient.                            All questions were answered, and informed consent                            was obtained. Prior Anticoagulants: The patient has                            taken no previous anticoagulant or antiplatelet                            agents. ASA Grade Assessment: II - A patient with                            mild systemic disease. After reviewing the risks                            and benefits, the patient was deemed in                            satisfactory condition to undergo the procedure.  After obtaining informed consent, the endoscope was                            passed under direct vision. Throughout the                            procedure, the patient's blood pressure, pulse, and                            oxygen saturations were monitored continuously. The                            Endoscope was introduced through the mouth, and                            advanced to the second part of duodenum. The upper                             GI endoscopy was accomplished without difficulty.                            The patient tolerated the procedure well. Scope In: Scope Out: Findings:                 One severe benign-appearing, intrinsic stenosis was                            found 37 cm from the incisors. There are several                            semicircular webs or strictures of larger diameter                            above. This primary stricture measured 9 mm (inner                            diameter). There was inflammation. A TTS dilator                            was passed through the scope. Dilation with a                            12-13.5-15 mm balloon dilator was performed to 15                            mm.                           Multiple erosions were found in the gastric antrum.                            Biopsies were taken with a cold forceps for  Helicobacter pylori testing using CLOtest.                           The cardia and gastric fundus were normal on                            retroflexion.                           Multiple erosions were found in the duodenal bulb. Complications:            No immediate complications. Estimated Blood Loss:     Estimated blood loss: none. Impression:               1. High-grade esophageal stricture with                            esophagitis. Status post dilation to 15 mm                           2. Multiple gastric and duodenal erosions. Status                            post CLO biopsy. Recommendation:           1. Post-dilation diet                           2. Prescribe omeprazole 40 mg daily; #30; 11 refills                           3. Schedule repeat upper endoscopy with esophageal                            dilation in the [place] in about 4 weeks                           4. Follow-up CLO testing. Docia Chuck. [name], MD 10/27/2017 4:09:55 PM This report has been signed electronically.

## 2017-10-28 ENCOUNTER — Telehealth: Payer: Self-pay | Admitting: *Deleted

## 2017-10-28 LAB — HELICOBACTER PYLORI SCREEN-BIOPSY: UREASE: NEGATIVE

## 2017-10-28 NOTE — Telephone Encounter (Signed)
  Follow up Call-  Call back number 10/27/2017  Post procedure Call Back phone  # [phone]  Permission to leave phone message Yes  Some recent data might be hidden     Patient questions:  Do you have a fever, pain , or abdominal swelling? No. Pain Score  0 *  Have you tolerated food without any problems? Yes.    Have you been able to return to your normal activities? Yes.    Do you have any questions about your discharge instructions: Diet   No. Medications  No. Follow up visit  No.  Do you have questions or concerns about your Care? No.  Actions: * If pain score is 4 or above: No action needed, pain <4.pt says he did not eat yesterday,just drank water and today he is going to try soft foods

## 2017-11-17 ENCOUNTER — Telehealth: Payer: Self-pay

## 2017-11-17 NOTE — Telephone Encounter (Signed)
Patient No Showed for Pre-Visit. I called to reschedule his PV appointment. No answer. I was not able to leave a message due to voice mail not being set up. I will try to call him again later today, if no answer, I will send a no show letter after 5:00 Pm today. If unable to reach his endoscopy that is scheduled on 12/03/17 will be cancelled per [place] guidelines.   Riki Sheer, LPN ( PV )

## 2017-11-24 ENCOUNTER — Ambulatory Visit (AMBULATORY_SURGERY_CENTER): Payer: Self-pay

## 2017-11-24 ENCOUNTER — Other Ambulatory Visit: Payer: Self-pay

## 2017-11-24 VITALS — Ht 68.0 in | Wt 220.4 lb

## 2017-11-24 DIAGNOSIS — R1310 Dysphagia, unspecified: Secondary | ICD-10-CM

## 2017-11-24 NOTE — Progress Notes (Signed)
Denies allergies to eggs or soy products. Denies complication of anesthesia or sedation. Denies use of weight loss medication. Denies use of O2.   Emmi instructions declined.  

## 2017-11-25 ENCOUNTER — Encounter: Payer: Self-pay | Admitting: Internal Medicine

## 2017-12-03 ENCOUNTER — Encounter: Payer: Self-pay | Admitting: Internal Medicine

## 2017-12-03 ENCOUNTER — Ambulatory Visit (AMBULATORY_SURGERY_CENTER): Payer: Medicare Other | Admitting: Internal Medicine

## 2017-12-03 ENCOUNTER — Encounter: Payer: Medicare Other | Admitting: Internal Medicine

## 2017-12-03 VITALS — BP 145/85 | HR 87 | Temp 99.5°F | Resp 12 | Ht 68.0 in | Wt 220.0 lb

## 2017-12-03 DIAGNOSIS — R1310 Dysphagia, unspecified: Secondary | ICD-10-CM | POA: Diagnosis present

## 2017-12-03 DIAGNOSIS — K2100 Gastro-esophageal reflux disease with esophagitis, without bleeding: Secondary | ICD-10-CM

## 2017-12-03 DIAGNOSIS — K222 Esophageal obstruction: Secondary | ICD-10-CM

## 2017-12-03 MED ORDER — SODIUM CHLORIDE 0.9 % IV SOLN: 500.0000 mL | Freq: Once | INTRAVENOUS | Status: DC

## 2017-12-03 NOTE — Patient Instructions (Signed)
YOU HAD AN ENDOSCOPIC PROCEDURE TODAY AT [facility]:   Refer to the procedure report that was given to you for any specific questions about what was found during the examination.  If the procedure report does not answer your questions, please call your gastroenterologist to clarify.  If you requested that your care partner not be given the details of your procedure findings, then the procedure report has been included in a sealed envelope for you to review at your convenience later.  YOU SHOULD EXPECT: Some feelings of bloating in the abdomen. Passage of more gas than usual.  Walking can help get rid of the air that was put into your GI tract during the procedure and reduce the bloating.   Please Note:  You might notice some irritation and congestion in your nose or some drainage.  This is from the oxygen used during your procedure.  There is no need for concern and it should clear up in a day or so.  SYMPTOMS TO REPORT IMMEDIATELY:    Following upper endoscopy (EGD)  Vomiting of blood or coffee ground material  New chest pain or pain under the shoulder blades  Painful or persistently difficult swallowing  New shortness of breath  Fever of 100F or higher  Black, tarry-looking stools  For urgent or emergent issues, a gastroenterologist can be reached at any hour by calling [phone].   DIET:  We do recommend clear liquids until 12:15 and then a soft diet for the rest of today. You may have a regular diet tomorrow.  Drink plenty of fluids but you should avoid alcoholic beverages for 24 hours.  ACTIVITY:  You should plan to take it easy for the rest of today and you should NOT DRIVE or use heavy machinery until tomorrow (because of the sedation medicines used during the test).    FOLLOW UP: Our staff will call the number listed on your records the next business day following your procedure to check on you and address any questions or concerns that you may have regarding  the information given to you following your procedure. If we do not reach you, we will leave a message.  However, if you are feeling well and you are not experiencing any problems, there is no need to return our call.  We will assume that you have returned to your regular daily activities without incident.  If any biopsies were taken you will be contacted by phone or by letter within the next 1-3 weeks.  Please call us at [phone] if you have not heard about the biopsies in 3 weeks.    SIGNATURES/CONFIDENTIALITY: You and/or your care partner have signed paperwork which will be entered into your electronic medical record.  These signatures attest to the fact that that the information above on your After Visit Summary has been reviewed and is understood.  Full responsibility of the confidentiality of this discharge information lies with you and/or your care-partner.  We will schedule your repeat EGD today for you, and another pre-visit as well.

## 2017-12-03 NOTE — Op Note (Signed)
[facility] Patient Name: Lander Eslick Procedure Date: 12/03/2017 10:43 AM MRN: [id number] Endoscopist: Docia Chuck. [name] , MD Age: 71 Referring MD:  Date of Birth: 1946-12-11 Gender: Male Account #: [id number] Procedure:                Upper GI endoscopy, with balloon dilation of the                            esophagus Indications:              Dysphagia, Therapeutic procedure. High-grade                            esophageal stricture dilated to 15 mm 10/27/2017.                            Now for repeat dilation Medicines:                Monitored Anesthesia Care Procedure:                Pre-Anesthesia Assessment:                           - Prior to the procedure, a History and Physical                            was performed, and patient medications and                            allergies were reviewed. The patient's tolerance of                            previous anesthesia was also reviewed. The risks                            and benefits of the procedure and the sedation                            options and risks were discussed with the patient.                            All questions were answered, and informed consent                            was obtained. Prior Anticoagulants: The patient has                            taken no previous anticoagulant or antiplatelet                            agents. After reviewing the risks and benefits, the                            patient was deemed in satisfactory condition to  undergo the procedure.                           After obtaining informed consent, the endoscope was                            passed under direct vision. Throughout the                            procedure, the patient's blood pressure, pulse, and                            oxygen saturations were monitored continuously. The                            Endoscope was introduced through the mouth, and               advanced to the second part of duodenum. The upper                            GI endoscopy was accomplished without difficulty.                            The patient tolerated the procedure well. Scope In: Scope Out: Findings:                 Multiple moderate benign-appearing, intrinsic                            stenoses were found at the gastroesophageal                            junction. The narrowest stenosis measured 1 cm                            (inner diameter), was dense, and had mild                            associated inflammation. A TTS dilator was passed                            through the scope. Dilation with a 15-16.5-18 mm                            balloon dilator was performed to 18 mm.                           The exam of the esophagus was otherwise normal.                           The stomach was normal, save small hiatal hernia.                           The examined duodenum was normal.  The cardia and gastric fundus were normal on                            retroflexion. Complications:            No immediate complications. Estimated Blood Loss:     Estimated blood loss: none. Impression:               - Benign-appearing esophageal stenoses. Dilated.                           - Normal stomach.                           - Normal examined duodenum.                           - No specimens collected. Recommendation:           - Patient has a contact number available for                            emergencies. The signs and symptoms of potential                            delayed complications were discussed with the                            patient. Return to normal activities tomorrow.                            Written discharge instructions were provided to the                            patient.                           - Post dilation diet.                           - Continue present medications. Continue  omeprazole                            40 mg daily                           - Repeat EGD with esophageal dilation in the Post in                            4 weeks. Docia Chuck. [name], MD 12/03/2017 11:08:52 AM This report has been signed electronically.

## 2017-12-03 NOTE — Progress Notes (Signed)
To PACU, VSS. Report to RN.tb 

## 2017-12-03 NOTE — Progress Notes (Signed)
Pt's states no medical or surgical changes since previsit or office visit. 

## 2017-12-03 NOTE — Progress Notes (Signed)
Called to room to assist during endoscopic procedure.  Patient ID and intended procedure confirmed with present staff. Received instructions for my participation in the procedure from the performing physician.  

## 2017-12-04 ENCOUNTER — Telehealth: Payer: Self-pay | Admitting: *Deleted

## 2017-12-04 NOTE — Telephone Encounter (Signed)
  Follow up Call-  Call back number 12/03/2017 10/27/2017  Post procedure Call Back phone  # [phone]  Permission to leave phone message Yes Yes  Some recent data might be hidden     Patient questions:  Message left to call us if necessary.

## 2017-12-04 NOTE — Telephone Encounter (Signed)
  Follow up Call-  Call back number 12/03/2017 10/27/2017  Post procedure Call Back phone  # [phone]  Permission to leave phone message Yes Yes  Some recent data might be hidden     Patient questions:  VM is not working. Second call.

## 2018-01-12 ENCOUNTER — Other Ambulatory Visit: Payer: Self-pay

## 2018-01-12 ENCOUNTER — Ambulatory Visit (AMBULATORY_SURGERY_CENTER): Payer: Self-pay

## 2018-01-12 VITALS — Ht 68.0 in | Wt 219.0 lb

## 2018-01-12 DIAGNOSIS — R1310 Dysphagia, unspecified: Secondary | ICD-10-CM

## 2018-01-12 NOTE — Progress Notes (Signed)
Denies allergies to eggs or soy products. Denies complication of anesthesia or sedation. Denies use of weight loss medication. Denies use of O2.   Emmi instructions declined.  

## 2018-01-13 ENCOUNTER — Encounter: Payer: Self-pay | Admitting: Internal Medicine

## 2018-01-16 ENCOUNTER — Other Ambulatory Visit: Payer: Self-pay | Admitting: Adult Health

## 2018-01-19 NOTE — Telephone Encounter (Signed)
Tried reaching the pt.  Now past due for A1C follow up.  Received a message that no voicemail available.  Will try again at a later time.

## 2018-01-25 NOTE — Telephone Encounter (Signed)
Spoke to the pt and he has made an appt for 01/27/18 @ 8:30.  90 day supply sent to the pharmacy.

## 2018-01-26 ENCOUNTER — Other Ambulatory Visit: Payer: Self-pay

## 2018-01-26 ENCOUNTER — Ambulatory Visit (AMBULATORY_SURGERY_CENTER): Payer: Medicare Other | Admitting: Internal Medicine

## 2018-01-26 ENCOUNTER — Encounter: Payer: Self-pay | Admitting: Internal Medicine

## 2018-01-26 VITALS — BP 133/84 | HR 67 | Temp 97.7°F | Resp 9 | Ht 68.0 in | Wt 219.0 lb

## 2018-01-26 DIAGNOSIS — R1311 Dysphagia, oral phase: Secondary | ICD-10-CM

## 2018-01-26 DIAGNOSIS — K222 Esophageal obstruction: Secondary | ICD-10-CM

## 2018-01-26 DIAGNOSIS — K2100 Gastro-esophageal reflux disease with esophagitis, without bleeding: Secondary | ICD-10-CM

## 2018-01-26 DIAGNOSIS — R1310 Dysphagia, unspecified: Secondary | ICD-10-CM

## 2018-01-26 DIAGNOSIS — E119 Type 2 diabetes mellitus without complications: Secondary | ICD-10-CM | POA: Diagnosis not present

## 2018-01-26 MED ORDER — SODIUM CHLORIDE 0.9 % IV SOLN: 500.0000 mL | Freq: Once | INTRAVENOUS | Status: DC

## 2018-01-26 NOTE — Patient Instructions (Signed)
Handouts given for Post dilation diet and esophageal stricture  YOU HAD AN ENDOSCOPIC PROCEDURE TODAY AT THE [facility]:   Refer to the procedure report that was given to you for any specific questions about what was found during the examination.  If the procedure report does not answer your questions, please call your gastroenterologist to clarify.  If you requested that your care partner not be given the details of your procedure findings, then the procedure report has been included in a sealed envelope for you to review at your convenience later.  YOU SHOULD EXPECT: Some feelings of bloating in the abdomen. Passage of more gas than usual.  Walking can help get rid of the air that was put into your GI tract during the procedure and reduce the bloating. If you had a lower endoscopy (such as a colonoscopy or flexible sigmoidoscopy) you may notice spotting of blood in your stool or on the toilet paper. If you underwent a bowel prep for your procedure, you may not have a normal bowel movement for a few days.  Please Note:  You might notice some irritation and congestion in your nose or some drainage.  This is from the oxygen used during your procedure.  There is no need for concern and it should clear up in a day or so.  SYMPTOMS TO REPORT IMMEDIATELY:   Following upper endoscopy (EGD)  Vomiting of blood or coffee ground material  New chest pain or pain under the shoulder blades  Painful or persistently difficult swallowing  New shortness of breath  Fever of 100F or higher  Black, tarry-looking stools  For urgent or emergent issues, a gastroenterologist can be reached at any hour by calling [phone].   DIET:  We do recommend a small meal at first, but then you may proceed to your regular diet.  Drink plenty of fluids but you should avoid alcoholic beverages for 24 hours.  ACTIVITY:  You should plan to take it easy for the rest of today and you should NOT DRIVE or use heavy  machinery until tomorrow (because of the sedation medicines used during the test).    FOLLOW UP: Our staff will call the number listed on your records the next business day following your procedure to check on you and address any questions or concerns that you may have regarding the information given to you following your procedure. If we do not reach you, we will leave a message.  However, if you are feeling well and you are not experiencing any problems, there is no need to return our call.  We will assume that you have returned to your regular daily activities without incident.  If any biopsies were taken you will be contacted by phone or by letter within the next 1-3 weeks.  Please call us at [phone] if you have not heard about the biopsies in 3 weeks.    SIGNATURES/CONFIDENTIALITY: You and/or your care partner have signed paperwork which will be entered into your electronic medical record.  These signatures attest to the fact that that the information above on your After Visit Summary has been reviewed and is understood.  Full responsibility of the confidentiality of this discharge information lies with you and/or your care-partner.

## 2018-01-26 NOTE — Progress Notes (Signed)
Pt's states no medical or surgical changes since previsit or office visit. 

## 2018-01-26 NOTE — Op Note (Signed)
[facility] Patient Name: Jared [name] Procedure Date: 01/26/2018 11:07 AM MRN: [id number] Endoscopist: Docia Chuck. [name] , MD Age: 72 Referring MD:  Date of Birth: [birth year] Gender: Male Account #: [id number] Procedure:                Upper GI endoscopy, with balloon dilation of the                            esophagus - 45mm Indications:              Dysphagia. Previous dilations November 2018 and                            December 2018 Medicines:                Monitored Anesthesia Care Procedure:                Pre-Anesthesia Assessment:                           - Prior to the procedure, a History and Physical                            was performed, and patient medications and                            allergies were reviewed. The patient's tolerance of                            previous anesthesia was also reviewed. The risks                            and benefits of the procedure and the sedation                            options and risks were discussed with the patient.                            All questions were answered, and informed consent                            was obtained. Prior Anticoagulants: The patient has                            taken no previous anticoagulant or antiplatelet                            agents. ASA Grade Assessment: II - A patient with                            mild systemic disease. After reviewing the risks                            and benefits, the patient was deemed in  satisfactory condition to undergo the procedure.                           After obtaining informed consent, the endoscope was                            passed under direct vision. Throughout the                            procedure, the patient's blood pressure, pulse, and                            oxygen saturations were monitored continuously. The                            Model [id number]) scope was introduced                             through the mouth, and advanced to the second part                            of duodenum. The upper GI endoscopy was                            accomplished without difficulty. The patient                            tolerated the procedure well. Scope In: Scope Out: Findings:                 Multiple moderate benign-appearing, intrinsic                            stenoses were found 36 cm from the incisors. The                            narrowest stenosis measured 1.4 cm (inner                            diameter). A TTS dilator was passed through the                            scope. Dilation with a 15-16.5-18 mm balloon                            dilator was performed to 18 mm. The ring was                            disrupted as intended.                           The exam of the esophagus was otherwise normal.                           The stomach was normal, save  small hiatal hernia.                           The examined duodenum was normal.                           The cardia and gastric fundus were normal on                            retroflexion. Complications:            No immediate complications. Estimated Blood Loss:     Estimated blood loss: none. Impression:               - Benign-appearing esophageal stenoses. Dilated.                           - Normal stomach.                           - Normal examined duodenum.                           - No specimens collected. Recommendation:           - Patient has a contact number available for                            emergencies. The signs and symptoms of potential                            delayed complications were discussed with the                            patient. Return to normal activities tomorrow.                            Written discharge instructions were provided to the                            patient.                           - Post dilation diet.                            - Continue present medications.                           - Office follow-up with Dr. [name] about 6-8 weeks.                            Follow-up regarding his swallowing and set up                            follow-up colonoscopy. Docia Chuck. [name], MD 01/26/2018 11:26:40 AM This report has been signed electronically.

## 2018-01-26 NOTE — Progress Notes (Signed)
Report to PACU, RN, vss, BBS= Clear.  

## 2018-01-26 NOTE — Progress Notes (Signed)
Called to room to assist during endoscopic procedure.  Patient ID and intended procedure confirmed with present staff. Received instructions for my participation in the procedure from the performing physician.  

## 2018-01-27 ENCOUNTER — Telehealth: Payer: Self-pay

## 2018-01-27 ENCOUNTER — Ambulatory Visit (INDEPENDENT_AMBULATORY_CARE_PROVIDER_SITE_OTHER): Payer: Medicare Other | Admitting: Adult Health

## 2018-01-27 ENCOUNTER — Encounter: Payer: Self-pay | Admitting: Adult Health

## 2018-01-27 VITALS — BP 144/90 | Temp 98.0°F | Wt 216.0 lb

## 2018-01-27 DIAGNOSIS — E1121 Type 2 diabetes mellitus with diabetic nephropathy: Secondary | ICD-10-CM | POA: Diagnosis not present

## 2018-01-27 DIAGNOSIS — N2581 Secondary hyperparathyroidism of renal origin: Secondary | ICD-10-CM | POA: Diagnosis not present

## 2018-01-27 DIAGNOSIS — N189 Chronic kidney disease, unspecified: Secondary | ICD-10-CM | POA: Diagnosis not present

## 2018-01-27 DIAGNOSIS — N183 Chronic kidney disease, stage 3 (moderate): Secondary | ICD-10-CM | POA: Diagnosis not present

## 2018-01-27 LAB — BASIC METABOLIC PANEL
BUN: 13 (ref 4–21)
Creatinine: 1.8 — AB (ref 0.6–1.3)
GLUCOSE: 126
Potassium: 4.3 (ref 3.4–5.3)
Sodium: 142 (ref 137–147)

## 2018-01-27 LAB — CBC AND DIFFERENTIAL
HEMATOCRIT: 37 — AB (ref 41–53)
HEMOGLOBIN: 12.8 — AB (ref 13.5–17.5)
Platelets: 284 (ref 150–399)
WBC: 6.2

## 2018-01-27 LAB — VITAMIN D 25 HYDROXY (VIT D DEFICIENCY, FRACTURES): VIT D 25 HYDROXY: 22.1

## 2018-01-27 LAB — POCT GLYCOSYLATED HEMOGLOBIN (HGB A1C): HEMOGLOBIN A1C: 5.4

## 2018-01-27 NOTE — Telephone Encounter (Signed)
  Follow up Call-  Call back number 01/26/2018 12/03/2017 10/27/2017  Post procedure Call Back phone  # [phone]  Permission to leave phone message Yes Yes Yes  Some recent data might be hidden     Patient questions:  Do you have a fever, pain , or abdominal swelling? No. Pain Score  0 *  Have you tolerated food without any problems? Yes.    Have you been able to return to your normal activities? Yes.    Do you have any questions about your discharge instructions: Diet   No. Medications  No. Follow up visit  No.  Do you have questions or concerns about your Care? No.  Actions: * If pain score is 4 or above: No action needed, pain <4.

## 2018-01-27 NOTE — Progress Notes (Signed)
Subjective:    Patient ID: Jared [name], male    DOB: Sep 02, 1946, 72 y.o.   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  He presents to the office today for follow-up regarding diabetes.  Currently takes metformin 500 mg twice daily and his blood sugars are very well controlled.  Last A1c was 5.5 in August 2018  He has no acute complaints today.    Review of Systems  Constitutional: Negative.   Respiratory: Negative.   Cardiovascular: Negative.   Gastrointestinal: Negative.   Genitourinary: Negative.   Hematological: Negative.   All other systems reviewed and are negative.  Past Medical History:  Diagnosis Date  . Asthma    as a child  . 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  . Diabetes mellitus type 2 in obese ([place])   . GERD (gastroesophageal reflux disease)   . GOUT 07/26/2007  . HYPERCHOLESTEROLEMIA, BORDERLINE 01/16/2010  . HYPERTENSION 07/26/2007  . OSTEOARTHRITIS 09/28/2007  . PLANTAR FASCIITIS, BILATERAL 04/19/2010  . PROSTATITIS, RECURRENT 11/08/2009  . RENAL INSUFFICIENCY 07/26/2007  . Sleep apnea   . Stroke ([place]) 01/21/2013  . 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 URINARY FREQUENCY, CHRONIC 03/10/2008    Social History   Socioeconomic History  . Marital status: Married    Spouse name: [name]  . Number of children: 5  . Years of education: 43  . Highest education level: Not on file  Social Needs  . Financial  resource strain: Not on file  . Food insecurity - worry: Not on file  . Food insecurity - inability: Not on file  . Transportation needs - medical: Not on file  . Transportation needs - non-medical: Not on file  Occupational History  . Occupation: TESTOR    Employer: [employer]: retired  Tobacco Use  . Smoking status: Never Smoker  . Smokeless tobacco: Never Used  Substance and Sexual Activity  . Alcohol use: No    Comment: none  . Drug use: No  . Sexual activity: No  Other Topics Concern  . Not on file  Social History Narrative   He has a part time job for delivering car parts   Married    4 children - All in [place]      He likes to go to car shows and shoot pool.     Past Surgical History:  Procedure Laterality Date  . COLONOSCOPY    . removed cyst from leg    . rt arm surgery for gout    . TONSILLECTOMY      Family History  Problem Relation Age of Onset  . Heart disease Mother   . Heart disease Father   . CVA Father   . Colon polyps Father   . Diabetes Sister   . Colon cancer Neg Hx   . Esophageal cancer Neg Hx   . Stomach cancer Neg Hx   . Pancreatic cancer Neg Hx   . Rectal cancer Neg Hx     Allergies  Allergen Reactions  . Other Shortness Of Breath and Other (See Comments)    Strawberries  Current Outpatient Medications on File Prior to Visit  Medication Sig Dispense Refill  . allopurinol (ZYLOPRIM) 100 MG tablet TAKE 1 TABLET BY MOUTH EVERY DAY 90 tablet 1  . amLODipine (NORVASC) 10 MG tablet TAKE 1 TABLET BY MOUTH EVERY DAY 90 tablet 3  . aspirin EC 325 MG tablet Take 1 tablet (325 mg total) by mouth daily.  0  . colchicine 0.6 MG tablet Take 0.6 mg by mouth daily as needed.   11  . labetalol (NORMODYNE) 200 MG tablet TAKE 2 TABLETS BY MOUTH TWICE A DAY 360 tablet 3  . lisinopril (PRINIVIL,ZESTRIL) 40 MG tablet TAKE 1 TABLET BY MOUTH EVERY DAY 90 tablet 3  . metFORMIN (GLUCOPHAGE) 500 MG tablet TAKE 1 TABLET BY MOUTH TWICE A DAY WITH A  MEAL 180 tablet 0  . omeprazole (PRILOSEC) 40 MG capsule Take 1 capsule (40 mg total) daily by mouth. 30 capsule 11  . simvastatin (ZOCOR) 20 MG tablet TAKE 1 TABLET BY MOUTH EVERY DAY AT 6 PM 90 tablet 2   Current Facility-Administered Medications on File Prior to Visit  Medication Dose Route Frequency Provider Last Rate Last Dose  . 0.9 %  sodium chloride infusion  500 mL Intravenous Once Irene Shipper, MD      . 0.9 %  sodium chloride infusion  500 mL Intravenous Once Irene Shipper, MD        BP (!) 144/90 (BP Location: Left Arm)   Temp 98 F (36.7 C) (Oral)   Wt 216 lb (98 kg)   BMI 32.84 kg/m       Objective:   Physical Exam  Constitutional: He is oriented to person, place, and time. He appears well-developed and well-nourished. No distress.  Cardiovascular: Normal rate, regular rhythm, normal heart sounds and intact distal pulses. Exam reveals no gallop and no friction rub.  No murmur heard. Pulmonary/Chest: Effort normal and breath sounds normal. No respiratory distress. He has no wheezes. He has no rales. He exhibits no tenderness.  Neurological: He is alert and oriented to person, place, and time.  Skin: Skin is warm and dry. No rash noted. He is not diaphoretic. No erythema. No pallor.  Psychiatric: He has a normal mood and affect. His behavior is normal. Judgment and thought content normal.  Nursing note and vitals reviewed.     Assessment & Plan:  1. Type 2 diabetes mellitus with diabetic nephropathy, without long-term current use of insulin ([place]) - Diabetes is well controlled.  - POC HgB A1c- 5.4  - Will cut back on Metformin to daily.  - Follow up at physical.   Dorothyann Peng, NP

## 2018-02-04 DIAGNOSIS — N183 Chronic kidney disease, stage 3 (moderate): Secondary | ICD-10-CM | POA: Diagnosis not present

## 2018-02-04 DIAGNOSIS — N2581 Secondary hyperparathyroidism of renal origin: Secondary | ICD-10-CM | POA: Diagnosis not present

## 2018-02-04 DIAGNOSIS — I129 Hypertensive chronic kidney disease with stage 1 through stage 4 chronic kidney disease, or unspecified chronic kidney disease: Secondary | ICD-10-CM | POA: Diagnosis not present

## 2018-02-04 DIAGNOSIS — D631 Anemia in chronic kidney disease: Secondary | ICD-10-CM | POA: Diagnosis not present

## 2018-02-10 ENCOUNTER — Encounter: Payer: Self-pay | Admitting: Family Medicine

## 2018-03-17 LAB — HM DIABETES EYE EXAM

## 2018-03-18 ENCOUNTER — Ambulatory Visit: Payer: Medicare Other | Admitting: Internal Medicine

## 2018-03-24 ENCOUNTER — Encounter: Payer: Self-pay | Admitting: Family Medicine

## 2018-04-21 ENCOUNTER — Other Ambulatory Visit: Payer: Self-pay | Admitting: Adult Health

## 2018-04-21 NOTE — Telephone Encounter (Signed)
Sent to the pharmacy by e-scribe. 

## 2018-04-28 ENCOUNTER — Ambulatory Visit: Payer: Medicare Other | Admitting: Internal Medicine

## 2018-04-28 ENCOUNTER — Encounter: Payer: Self-pay | Admitting: Internal Medicine

## 2018-04-28 VITALS — BP 128/80 | HR 76 | Ht 68.5 in | Wt 221.4 lb

## 2018-04-28 DIAGNOSIS — Z1211 Encounter for screening for malignant neoplasm of colon: Secondary | ICD-10-CM

## 2018-04-28 DIAGNOSIS — K219 Gastro-esophageal reflux disease without esophagitis: Secondary | ICD-10-CM

## 2018-04-28 DIAGNOSIS — K2100 Gastro-esophageal reflux disease with esophagitis, without bleeding: Secondary | ICD-10-CM

## 2018-04-28 DIAGNOSIS — K222 Esophageal obstruction: Secondary | ICD-10-CM | POA: Diagnosis not present

## 2018-04-28 MED ORDER — NA SULFATE-K SULFATE-MG SULF 17.5-3.13-1.6 GM/177ML PO SOLN: 1.0000 | Freq: Once | ORAL | 0 refills | Status: AC

## 2018-04-28 MED ORDER — PANTOPRAZOLE SODIUM 40 MG PO TBEC: 40.0000 mg | DELAYED_RELEASE_TABLET | Freq: Every day | ORAL | 11 refills | Status: DC

## 2018-04-28 NOTE — Progress Notes (Signed)
HISTORY OF PRESENT ILLNESS:  Jared [name] is a 72 y.o. male with multiple significant medical problems as listed below who was evaluated in 09/30/2017 regarding intermittent solid food dysphagia, GERD, and colon cancer screening. He subsequently underwent upper endoscopy and was found to have severe stricturing of the esophagus as well as significant active esophagitis for which she underwent dilation therapy in November 2018, December 2018, and most recently February 2019. At the time of his last examination he was dilated to a maximal diameter of 18 mm. He was prescribed omeprazole 40 mg daily. He states that he took this for a short period of time as he felt that may have exacerbated his gout He has not been on PPI since. He has also contemplated repeating screening colonoscopy. Last examination elsewhere in August 2000 was negative except for mild diverticulosis. Patient reports to me that his swallowing is markedly improved. No significant reflux symptoms off medication. Lower GI review of systems is negative. He is interested in proceeding with screening colonoscopy.  REVIEW OF SYSTEMS:  All non-GI ROS negative unless otherwise stated in the history of present illness except for arthritis, cough, muscle cramps  Past Medical History:  Diagnosis Date  . Asthma    as a child  . 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  . Diabetes mellitus type 2 in obese ([place])   . GERD (gastroesophageal reflux disease)   . GOUT 07/26/2007  . HYPERCHOLESTEROLEMIA, BORDERLINE 01/16/2010  . HYPERTENSION 07/26/2007  . OSTEOARTHRITIS 09/28/2007  . PLANTAR FASCIITIS, BILATERAL 04/19/2010  . PROSTATITIS, RECURRENT 11/08/2009  . RENAL INSUFFICIENCY 07/26/2007  . Sleep apnea   . Stroke ([place]) 01/21/2013  . 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 URINARY FREQUENCY, CHRONIC 03/10/2008    Past Surgical History:  Procedure Laterality Date  . COLONOSCOPY    . removed cyst from leg    . rt arm  surgery for gout    . TONSILLECTOMY      Social History Domonick E Kallenberger  reports that he has never smoked. He has never used smokeless tobacco. He reports that he does not drink alcohol or use drugs.  family history includes CVA in his father; Colon polyps in his father; Diabetes in his sister; Heart disease in his father and mother.  Allergies  Allergen Reactions  . Other Shortness Of Breath and Other (See Comments)    Strawberries        PHYSICAL EXAMINATION: Vital signs: BP 128/80   Pulse 76   Ht 5' 8.5" (1.74 m)   Wt 221 lb 6 oz (100.4 kg)   BMI 33.17 kg/m   Constitutional: pleasant,generally well-appearing, no acute distress Psychiatric: alert and oriented x3, cooperative Eyes: extraocular movements intact, anicteric, conjunctiva pink Mouth: oral pharynx moist, no lesions Neck: supple no lymphadenopathy Cardiovascular: heart regular rate and rhythm, no murmur Lungs: clear to auscultation bilaterally Abdomen: soft, nontender, nondistended, no obvious ascites, no peritoneal signs, normal bowel sounds, no organomegaly Rectal:deferred until colonoscopy Extremities: no clubbing, cyanosis, or lower extremity edema bilaterally Skin: no lesions on visible extremities Neuro: No focal deficits. Cranial nerves intact  ASSESSMENT:  #1. GERD, complicated by erosive esophagitis and severe stricturing of the esophagus. Dysphagia has improved after multiple balloon dilation sessions. Off PPI as discussed above #2. Colon cancer screening. Appropriate candidate without contraindication. The patient is interested. #3. Multiple medical problems. Stable   PLAN:  #1. Reflux precautions #2. Prescribed pantoprazole as an alternative PPI. 40 mg daily. Stressed the importance of chronic PPI  therapy given his severe esophagitis and severe stricturing #3. Schedule screening colonoscopy.The nature of the procedure, as well as the risks, benefits, and alternatives were carefully and thoroughly  reviewed with the patient. Ample time for discussion and questions allowed. The patient understood, was satisfied, and agreed to proceed.. #4. Hold oral diabetic medications the day of the procedure to avoid and wanted hypoglycemia #5. Stressed importance of contacting this office should she develop recurrent dysphagia to any significant degree  25 minutes spent face-to-face with the patient. Greater than 50% a time use for counseling regarding his, reflux disease and reviewing the screening colonoscopy process in detail

## 2018-04-28 NOTE — Patient Instructions (Signed)
We have sent the following medications to your pharmacy for you to pick up at your convenience:  Protonix  You have been scheduled for a colonoscopy. Please follow written instructions given to you at your visit today.  Please pick up your prep supplies at the pharmacy within the next 1-3 days. If you use inhalers (even only as needed), please bring them with you on the day of your procedure. Your physician has requested that you go to www.startemmi.com and enter the access code given to you at your visit today. This web site gives a general overview about your procedure. However, you should still follow specific instructions given to you by our office regarding your preparation for the procedure.

## 2018-05-17 ENCOUNTER — Encounter: Payer: Self-pay | Admitting: Internal Medicine

## 2018-05-26 ENCOUNTER — Telehealth: Payer: Self-pay | Admitting: Internal Medicine

## 2018-05-26 NOTE — Telephone Encounter (Signed)
Returned patient's call and he states that he ate a chicken sandwich for lunch.  I advised patient that he was not suppose to have any solid food today.  Stressed the importance of him having only clear liquids the rest of the day up until 1 pm tomorrow.  Went over his instructions with him again and I hope he understands.  He stated that his instructions were in the car.  Advised him also not to take his diabetic medication tomorrow.  All questions were answered and hopefully patient had full understanding.

## 2018-05-27 ENCOUNTER — Ambulatory Visit (AMBULATORY_SURGERY_CENTER): Payer: Medicare Other | Admitting: Internal Medicine

## 2018-05-27 ENCOUNTER — Encounter: Payer: Self-pay | Admitting: Internal Medicine

## 2018-05-27 ENCOUNTER — Other Ambulatory Visit: Payer: Self-pay

## 2018-05-27 VITALS — BP 163/96 | HR 70 | Temp 99.3°F | Resp 20 | Ht 68.0 in | Wt 221.0 lb

## 2018-05-27 DIAGNOSIS — Z1211 Encounter for screening for malignant neoplasm of colon: Secondary | ICD-10-CM | POA: Diagnosis present

## 2018-05-27 DIAGNOSIS — K219 Gastro-esophageal reflux disease without esophagitis: Secondary | ICD-10-CM | POA: Diagnosis not present

## 2018-05-27 DIAGNOSIS — K635 Polyp of colon: Secondary | ICD-10-CM

## 2018-05-27 DIAGNOSIS — D123 Benign neoplasm of transverse colon: Secondary | ICD-10-CM

## 2018-05-27 MED ORDER — SODIUM CHLORIDE 0.9 % IV SOLN: 500.0000 mL | Freq: Once | INTRAVENOUS | Status: DC

## 2018-05-27 NOTE — Progress Notes (Signed)
Pt's states no medical or surgical changes since previsit or office visit. 

## 2018-05-27 NOTE — Patient Instructions (Signed)
YOU HAD AN ENDOSCOPIC PROCEDURE TODAY AT [facility]:   Refer to the procedure report that was given to you for any specific questions about what was found during the examination.  If the procedure report does not answer your questions, please call your gastroenterologist to clarify.  If you requested that your care partner not be given the details of your procedure findings, then the procedure report has been included in a sealed envelope for you to review at your convenience later.  YOU SHOULD EXPECT: Some feelings of bloating in the abdomen. Passage of more gas than usual.  Walking can help get rid of the air that was put into your GI tract during the procedure and reduce the bloating. If you had a lower endoscopy (such as a colonoscopy or flexible sigmoidoscopy) you may notice spotting of blood in your stool or on the toilet paper. If you underwent a bowel prep for your procedure, you may not have a normal bowel movement for a few days.  Please Note:  You might notice some irritation and congestion in your nose or some drainage.  This is from the oxygen used during your procedure.  There is no need for concern and it should clear up in a day or so.  SYMPTOMS TO REPORT IMMEDIATELY:   Following lower endoscopy (colonoscopy or flexible sigmoidoscopy):  Excessive amounts of blood in the stool  Significant tenderness or worsening of abdominal pains  Swelling of the abdomen that is new, acute  Fever of 100F or higher  For urgent or emergent issues, a gastroenterologist can be reached at any hour by calling [phone].   DIET:  We do recommend a small meal at first, but then you may proceed to your regular diet.  Drink plenty of fluids but you should avoid alcoholic beverages for 24 hours.  ACTIVITY:  You should plan to take it easy for the rest of today and you should NOT DRIVE or use heavy machinery until tomorrow (because of the sedation medicines used during the test).     FOLLOW UP: Our staff will call the number listed on your records the next business day following your procedure to check on you and address any questions or concerns that you may have regarding the information given to you following your procedure. If we do not reach you, we will leave a message.  However, if you are feeling well and you are not experiencing any problems, there is no need to return our call.  We will assume that you have returned to your regular daily activities without incident.  Await for biopsy results Polyps (handout given) Hemorrhoids (handout given) Diverticulosis (handout given)  If any biopsies were taken you will be contacted by phone or by letter within the next 1-3 weeks.  Please call us at [phone] if you have not heard about the biopsies in 3 weeks.    SIGNATURES/CONFIDENTIALITY: You and/or your care partner have signed paperwork which will be entered into your electronic medical record.  These signatures attest to the fact that that the information above on your After Visit Summary has been reviewed and is understood.  Full responsibility of the confidentiality of this discharge information lies with you and/or your care-partner.

## 2018-05-27 NOTE — Progress Notes (Signed)
Report to PACU, RN, vss, BBS= Clear.  

## 2018-05-27 NOTE — Progress Notes (Signed)
Called to room to assist during endoscopic procedure.  Patient ID and intended procedure confirmed with present staff. Received instructions for my participation in the procedure from the performing physician.  

## 2018-05-27 NOTE — Op Note (Signed)
[facility] Patient Name: Jared [name] Procedure Date: 05/27/2018 3:43 PM MRN: [id number] Endoscopist: Docia Chuck. [name] , MD Age: 72 Referring MD:  Date of Birth: 17-May-1946 Gender: Male Account #: [id number] Procedure:                Colonoscopy With cold snare polypectomy x 1 Indications:              Screening for colorectal malignant neoplasm Medicines:                Monitored Anesthesia Care Procedure:                Pre-Anesthesia Assessment:                           - Prior to the procedure, a History and Physical                            was performed, and patient medications and                            allergies were reviewed. The patient's tolerance of                            previous anesthesia was also reviewed. The risks                            and benefits of the procedure and the sedation                            options and risks were discussed with the patient.                            All questions were answered, and informed consent                            was obtained. Prior Anticoagulants: The patient has                            taken no previous anticoagulant or antiplatelet                            agents. ASA Grade Assessment: II - A patient with                            mild systemic disease. After reviewing the risks                            and benefits, the patient was deemed in                            satisfactory condition to undergo the procedure.                           After obtaining informed consent, the colonoscope  was passed under direct vision. Throughout the                            procedure, the patient's blood pressure, pulse, and                            oxygen saturations were monitored continuously. The                            Colonoscope was introduced through the anus and                            advanced to the the cecum, identified by   appendiceal orifice and ileocecal valve. The                            ileocecal valve, appendiceal orifice, and rectum                            were photographed. The quality of the bowel                            preparation was excellent. The colonoscopy was                            performed without difficulty. The patient tolerated                            the procedure well. The bowel preparation used was                            SUPREP. Scope In: 3:52:40 PM Scope Out: 4:11:03 PM Scope Withdrawal Time: 0 hours 14 minutes 4 seconds  Total Procedure Duration: 0 hours 18 minutes 23 seconds  Findings:                 A 8 mm polyp was found in the transverse colon. The                            polyp was pedunculated. The polyp was removed with                            a cold snare. Resection and retrieval were complete.                           Multiple medium-mouthed diverticula were found in                            the entire colon.                           Internal hemorrhoids were found during retroflexion.                           The exam was otherwise without abnormality on  direct and retroflexion views. Complications:            No immediate complications. Estimated blood loss:                            None. Estimated Blood Loss:     Estimated blood loss: none. Impression:               - One 8 mm polyp in the transverse colon, removed                            with a cold snare. Resected and retrieved.                           - Diverticulosis in the entire examined colon.                           - Internal hemorrhoids.                           - The examination was otherwise normal on direct                            and retroflexion views. Recommendation:           - Repeat colonoscopy in 5 years for surveillance.                           - Patient has a contact number available for                             emergencies. The signs and symptoms of potential                            delayed complications were discussed with the                            patient. Return to normal activities tomorrow.                            Written discharge instructions were provided to the                            patient.                           - Resume previous diet.                           - Continue present medications.                           - Await pathology results. Docia Chuck. [name], MD 05/27/2018 4:15:42 PM This report has been signed electronically.

## 2018-05-28 ENCOUNTER — Telehealth: Payer: Self-pay | Admitting: *Deleted

## 2018-05-28 NOTE — Telephone Encounter (Signed)
I told him to expect to see some blood, but I expect it to resolve without incident. Thanks for the notification

## 2018-05-28 NOTE — Telephone Encounter (Signed)
  Follow up Call-  Call back number 05/27/2018 01/26/2018 12/03/2017 10/27/2017  Post procedure Call Back phone  # [phone]  Permission to leave phone message Yes Yes Yes Yes  Some recent data might be hidden     Patient questions:  Do you have a fever, pain , or abdominal swelling? No.   Patient states "some bleeding with bowel movement. Patient really couldn't tell me how much blood. States "not a lot in the commode."  Will look at how much blood next time he goes to bathroom. Patient will call if more than 1/2 cup or clots. No pain noted. Pain Score  0   Have you tolerated food without any problems? Yes.    Have you been able to return to your normal activities? Yes.    Do you have any questions about your discharge instructions: Diet   No. Medications  No. Follow up visit  No.  Do you have questions or concerns about your Care? Yes.    Actions: * If pain score is 4 or above: No action needed, pain <4.

## 2018-06-01 ENCOUNTER — Encounter: Payer: Self-pay | Admitting: Internal Medicine

## 2018-06-19 ENCOUNTER — Other Ambulatory Visit: Payer: Self-pay | Admitting: Adult Health

## 2018-06-22 NOTE — Telephone Encounter (Signed)
Sent to the pharmacy by e-scribe for 90 days.  Pt due for cpx in Aug.

## 2018-07-19 ENCOUNTER — Other Ambulatory Visit: Payer: Self-pay | Admitting: Adult Health

## 2018-07-21 NOTE — Telephone Encounter (Signed)
Needs CPX 

## 2018-07-21 NOTE — Telephone Encounter (Signed)
Tried to reach the pt by telephone.  Received a message that there is no voicemail box.  Will try again at a later time.

## 2018-07-23 NOTE — Telephone Encounter (Signed)
Tried to reach the pt by telephone.  Received a message that there is no voicemail box.  Will try again at a later time.

## 2018-07-27 NOTE — Telephone Encounter (Signed)
Tried to reach the pt by telephone. Received a message that there is no voicemail box. Will try again at a later time.  #30 SENT TO THE PHARMACY BY E-SCRIBE.

## 2018-08-10 ENCOUNTER — Ambulatory Visit: Payer: Medicare Other | Admitting: Adult Health

## 2018-08-12 ENCOUNTER — Other Ambulatory Visit: Payer: Self-pay | Admitting: Adult Health

## 2018-08-13 ENCOUNTER — Other Ambulatory Visit: Payer: Self-pay | Admitting: Adult Health

## 2018-08-18 ENCOUNTER — Other Ambulatory Visit: Payer: Self-pay | Admitting: Adult Health

## 2018-08-19 NOTE — Telephone Encounter (Signed)
Sent to the pharmacy by e-scribe for 90 days.  Pt scheduled for yearly on 08/24/18.

## 2018-08-24 ENCOUNTER — Encounter: Payer: Self-pay | Admitting: Adult Health

## 2018-08-24 ENCOUNTER — Ambulatory Visit (INDEPENDENT_AMBULATORY_CARE_PROVIDER_SITE_OTHER): Payer: Medicare Other | Admitting: Adult Health

## 2018-08-24 VITALS — BP 150/92 | Temp 98.5°F | Wt 229.0 lb

## 2018-08-24 DIAGNOSIS — E1121 Type 2 diabetes mellitus with diabetic nephropathy: Secondary | ICD-10-CM

## 2018-08-24 DIAGNOSIS — R2681 Unsteadiness on feet: Secondary | ICD-10-CM

## 2018-08-24 LAB — POCT GLYCOSYLATED HEMOGLOBIN (HGB A1C): HbA1c, POC (controlled diabetic range): 5.5 % (ref 0.0–7.0)

## 2018-08-24 NOTE — Progress Notes (Signed)
Subjective:    Patient ID: Jared [name], male    DOB: [birth year], 72 y.o.   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  He presents to the office for follow up of diabetes. He is controlled with Metformin 500 mg BID. His last A1c was 5.4. He denies any symptoms of hypoglycemia.   He does have an acute complaint of feeling " off balance". He denies any dizziness or lightheadedness. He denies any falls. Reports that he feels off balance when he walks, stands for long periods of times, or does activities such as getting into the shower. He was worried that his medication was causing him to feel like this and has been taking 1/2 tab of his lisinopril prescription   BP Readings from Last 3 Encounters:  08/24/18 (!) 150/92  05/27/18 (!) 163/96  04/28/18 128/80   Review of Systems See HPI   Past Medical History:  Diagnosis Date  . Asthma    as a child  . 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  . Diabetes mellitus type 2 in obese ([place])   . GERD (gastroesophageal reflux disease)   . GOUT 07/26/2007  . HYPERCHOLESTEROLEMIA, BORDERLINE 01/16/2010  . HYPERTENSION 07/26/2007  . OSTEOARTHRITIS 09/28/2007  . PLANTAR FASCIITIS, BILATERAL 04/19/2010  . PROSTATITIS, RECURRENT 11/08/2009  . RENAL INSUFFICIENCY 07/26/2007  . Sleep apnea   . Stroke ([place]) 01/21/2013  . 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 URINARY FREQUENCY, CHRONIC 03/10/2008     Social History   Socioeconomic History  . Marital status: Married    Spouse name: [name]  . Number of children: 5  . Years of education: 24  . Highest education level: Not on file  Occupational History  . Occupation: TESTOR    Employer: [employer]: retired  Scientific laboratory technician  . Financial resource strain: Not on file  . Food insecurity:    Worry: Not on file    Inability: Not on file  . Transportation needs:    Medical: Not on file    Non-medical: Not on file  Tobacco Use  . Smoking status: Never Smoker  . Smokeless tobacco: Never Used  Substance and Sexual Activity  . Alcohol use: No    Comment: none  . Drug use: No  . Sexual activity: Never  Lifestyle  . Physical activity:    Days per week: Not on file    Minutes per session: Not on file  . Stress: Not on file  Relationships  . Social connections:    Talks on phone: Not on file    Gets together: Not on file    Attends religious service: Not on file    Active member of club or organization: Not on file    Attends meetings of clubs or organizations: Not on file    Relationship status: Not on file  . Intimate partner violence:    Fear of current or ex partner: Not on file    Emotionally abused: Not on file    Physically abused: Not on file    Forced sexual activity: Not on file  Other Topics  Concern  . Not on file  Social History Narrative   He has a part time job for delivering car parts   Married    4 children - All in [place]      He likes to go to car shows and shoot pool.     Past Surgical History:  Procedure Laterality Date  . COLONOSCOPY    . removed cyst from leg    . rt arm surgery for gout    . TONSILLECTOMY      Family History  Problem Relation Age of Onset  . Heart disease Mother   . Heart disease Father   . CVA Father   . Colon polyps Father   . Diabetes Sister   . Colon cancer Neg Hx   . Esophageal cancer Neg Hx   . Stomach cancer Neg Hx   . Pancreatic cancer Neg Hx   . Rectal  cancer Neg Hx     Allergies  Allergen Reactions  . Other Shortness Of Breath and Other (See Comments)    Strawberries     Current Outpatient Medications on File Prior to Visit  Medication Sig Dispense Refill  . allopurinol (ZYLOPRIM) 100 MG tablet TAKE 1 TABLET BY MOUTH EVERY DAY 90 tablet 1  . amLODipine (NORVASC) 10 MG tablet TAKE 1 TABLET BY MOUTH EVERY DAY 90 tablet 0  . aspirin EC 325 MG tablet Take 1 tablet (325 mg total) by mouth daily.  0  . colchicine 0.6 MG tablet Take 0.6 mg by mouth daily as needed.   11  . labetalol (NORMODYNE) 200 MG tablet TAKE 2 TABLETS BY MOUTH TWICE A DAY 360 tablet 3  . labetalol (NORMODYNE) 200 MG tablet TAKE 2 TABLETS BY MOUTH TWICE A DAY 360 tablet 0  . lisinopril (PRINIVIL,ZESTRIL) 40 MG tablet TAKE 1 TABLET BY MOUTH EVERY DAY 90 tablet 3  . metFORMIN (GLUCOPHAGE) 500 MG tablet TAKE 1 TABLET BY MOUTH TWICE A DAY WITH A MEAL.  **NEEDS APPOINTMENT WITH [facility]** 30 tablet 0  . pantoprazole (PROTONIX) 40 MG tablet Take 1 tablet (40 mg total) by mouth daily. 30 tablet 11   No current facility-administered medications on file prior to visit.     BP (!) 150/92   Temp 98.5 F (36.9 C) (Oral)   Wt 229 lb (103.9 kg)   BMI 34.82 kg/m       Objective:   Physical Exam  Constitutional: He is oriented to person, place, and time. He appears well-developed and well-nourished. No distress.  HENT:  Head: Normocephalic and atraumatic.  Right Ear: External ear normal.  Left Ear: External ear normal.  Nose: Nose normal.  Mouth/Throat: Oropharynx is clear and moist. No oropharyngeal exudate.  Eyes: Pupils are equal, round, and reactive to light. Conjunctivae and EOM are normal. Right eye exhibits no discharge. Left eye exhibits no discharge. No scleral icterus.  Neck: Normal range of motion. Neck supple.  Cardiovascular: Normal rate, regular rhythm, normal heart sounds and intact distal pulses.  Pulmonary/Chest: Effort normal and breath sounds normal.    Abdominal: Soft. Bowel sounds are normal.  Musculoskeletal: Normal range of motion. He exhibits no edema, tenderness or deformity.  Has steady gait with single prong cane   Neurological: He is alert and oriented to person, place, and time.  Skin: Skin is warm and dry. He is not diaphoretic.  Psychiatric: He has a normal mood and affect. His behavior is normal. Judgment and thought content normal.  Nursing note and vitals  reviewed.     Assessment & Plan:  1. Type 2 diabetes mellitus with diabetic nephropathy, without long-term current use of insulin (HCC) - POCT A1C- 5.5  - Continue with current dose of Metformin   2. Gait instability - Does not appear to be related to hypotension or hyperglycemia.  - Steady gait in the office today  - I recommended PT but he refused at this time, will try strengthening core muscles and legs at the gym  - Follow up as needed  Dorothyann Peng, NP

## 2018-09-13 DIAGNOSIS — N2581 Secondary hyperparathyroidism of renal origin: Secondary | ICD-10-CM | POA: Diagnosis not present

## 2018-09-13 DIAGNOSIS — N183 Chronic kidney disease, stage 3 (moderate): Secondary | ICD-10-CM | POA: Diagnosis not present

## 2018-09-13 LAB — VITAMIN D 25 HYDROXY (VIT D DEFICIENCY, FRACTURES): Vit D, 25-Hydroxy: 27.4

## 2018-09-17 ENCOUNTER — Other Ambulatory Visit: Payer: Self-pay | Admitting: Adult Health

## 2018-09-20 ENCOUNTER — Other Ambulatory Visit: Payer: Self-pay

## 2018-09-20 NOTE — Patient Outreach (Signed)
[facility]) Care Management  09/20/2018  Jared [name] 1946-02-25 [id number]   Medication Adherence call to [name] patient did not answer patient is due on Metformin 500 mg. [name] is showing past due under [place].   [facility] Management Direct Dial [phone]  Fax [phone] [email or web address]

## 2018-10-05 DIAGNOSIS — N183 Chronic kidney disease, stage 3 (moderate): Secondary | ICD-10-CM | POA: Diagnosis not present

## 2018-10-05 DIAGNOSIS — N2581 Secondary hyperparathyroidism of renal origin: Secondary | ICD-10-CM | POA: Diagnosis not present

## 2018-10-05 DIAGNOSIS — I129 Hypertensive chronic kidney disease with stage 1 through stage 4 chronic kidney disease, or unspecified chronic kidney disease: Secondary | ICD-10-CM | POA: Diagnosis not present

## 2018-10-05 DIAGNOSIS — D631 Anemia in chronic kidney disease: Secondary | ICD-10-CM | POA: Diagnosis not present

## 2018-10-05 LAB — BASIC METABOLIC PANEL
BUN: 20 (ref 4–21)
BUN: 21 (ref 4–21)
CREATININE: 2.1 — AB (ref 0.6–1.3)
Creatinine: 2.1 — AB (ref 0.6–1.3)
GLUCOSE: 125
Glucose: 82
Potassium: 4 (ref 3.4–5.3)
Potassium: 4.7 (ref 3.4–5.3)
Sodium: 138 (ref 137–147)
Sodium: 140 (ref 137–147)

## 2018-10-05 LAB — CBC AND DIFFERENTIAL
Hemoglobin: 10.8 — AB (ref 13.5–17.5)
Platelets: 331 (ref 150–399)
WBC: 7.9

## 2018-10-05 LAB — VITAMIN D 25 HYDROXY (VIT D DEFICIENCY, FRACTURES): Vit D, 25-Hydroxy: 29.5

## 2018-10-14 ENCOUNTER — Encounter: Payer: Self-pay | Admitting: Family Medicine

## 2018-11-10 ENCOUNTER — Other Ambulatory Visit: Payer: Self-pay | Admitting: Adult Health

## 2018-11-10 NOTE — Telephone Encounter (Signed)
Pt due for cpx.  Tried to reach him by telephone.  Received a message that he does not have a voicemail bix set up.  Will try to reach him at another time.

## 2018-11-16 ENCOUNTER — Encounter: Payer: Self-pay | Admitting: Family Medicine

## 2018-11-16 NOTE — Telephone Encounter (Signed)
30 day supply sent to the pt and a letter mailed.

## 2018-11-30 ENCOUNTER — Ambulatory Visit: Payer: Medicare Other | Admitting: *Deleted

## 2018-11-30 DIAGNOSIS — I10 Essential (primary) hypertension: Secondary | ICD-10-CM

## 2018-12-01 ENCOUNTER — Encounter: Payer: Self-pay | Admitting: Adult Health

## 2018-12-01 ENCOUNTER — Telehealth: Payer: Self-pay

## 2018-12-01 ENCOUNTER — Ambulatory Visit (INDEPENDENT_AMBULATORY_CARE_PROVIDER_SITE_OTHER): Payer: Medicare Other | Admitting: Adult Health

## 2018-12-01 ENCOUNTER — Other Ambulatory Visit: Payer: Self-pay

## 2018-12-01 VITALS — BP 190/110 | Temp 98.0°F | Wt 226.0 lb

## 2018-12-01 DIAGNOSIS — Z23 Encounter for immunization: Secondary | ICD-10-CM | POA: Diagnosis not present

## 2018-12-01 DIAGNOSIS — I10 Essential (primary) hypertension: Secondary | ICD-10-CM

## 2018-12-01 LAB — CBC WITH DIFFERENTIAL/PLATELET
BASOS PCT: 1.3 % (ref 0.0–3.0)
Basophils Absolute: 0.1 10*3/uL (ref 0.0–0.1)
EOS ABS: 0.3 10*3/uL (ref 0.0–0.7)
Eosinophils Relative: 4.8 % (ref 0.0–5.0)
HEMATOCRIT: 36.9 % — AB (ref 39.0–52.0)
HEMOGLOBIN: 12.6 g/dL — AB (ref 13.0–17.0)
LYMPHS ABS: 1.4 10*3/uL (ref 0.7–4.0)
LYMPHS PCT: 23 % (ref 12.0–46.0)
MCHC: 34 g/dL (ref 30.0–36.0)
MCV: 87.4 fl (ref 78.0–100.0)
MONOS PCT: 10.8 % (ref 3.0–12.0)
Monocytes Absolute: 0.7 10*3/uL (ref 0.1–1.0)
NEUTROS PCT: 60.1 % (ref 43.0–77.0)
Neutro Abs: 3.8 10*3/uL (ref 1.4–7.7)
Platelets: 226 10*3/uL (ref 150.0–400.0)
RBC: 4.22 Mil/uL (ref 4.22–5.81)
RDW: 14 % (ref 11.5–15.5)
WBC: 6.3 10*3/uL (ref 4.0–10.5)

## 2018-12-01 LAB — BASIC METABOLIC PANEL WITH GFR
BUN: 24 mg/dL — ABNORMAL HIGH (ref 6–23)
CO2: 26 meq/L (ref 19–32)
Calcium: 9.3 mg/dL (ref 8.4–10.5)
Chloride: 107 meq/L (ref 96–112)
Creatinine, Ser: 2.28 mg/dL — ABNORMAL HIGH (ref 0.40–1.50)
GFR: 36.69 mL/min — ABNORMAL LOW (ref >60.00)
Glucose, Bld: 89 mg/dL (ref 70–99)
Potassium: 4.4 meq/L (ref 3.5–5.1)
Sodium: 141 meq/L (ref 135–145)

## 2018-12-01 MED ORDER — CLONIDINE HCL 0.1 MG PO TABS: 0.1000 mg | ORAL_TABLET | Freq: Once | ORAL | Status: DC

## 2018-12-01 MED ORDER — HYDRALAZINE HCL 25 MG PO TABS: 25.0000 mg | ORAL_TABLET | Freq: Three times a day (TID) | ORAL | 1 refills | Status: DC

## 2018-12-01 NOTE — Telephone Encounter (Signed)
Per Gwenlyn Found DOD call, pt to have renal dopplers scheduled for accelerated HTN; F/U appointment with Dr. Sharlyne Cai to be scheduled soon after renal doppler study and results read

## 2018-12-01 NOTE — Patient Instructions (Signed)
I have spoke to cardiology and they will call you to schedule your office visit   I have sent in a prescription for a medication called hydralazine - please take this three times a day   If you develops any chest pain or shortness of breath, please go to the ER

## 2018-12-01 NOTE — Progress Notes (Signed)
Subjective:    Patient ID: Jared [name], male    DOB: 06-Jan-1946, 72 y.o.   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  He presents to the office today for elevated blood pressure readings. He first noticed that his blood pressure was elevated over the last week. He reports taking his medication as directed. He is currently prescribed Norvasc 10 mg, lisinopril 40 mg, and labetalol 400 mg BID. He reports readings at home of 190-220/90-100's.   He does report episodes of frontal headaches over the last week. He denies shortness of breath or chest pain. Had one episode of mid sternal " chest pressure" that was short lived over the last week. Denies any radiating chest pain.   BP Readings from Last 3 Encounters:  12/01/18 (!) 190/110  08/24/18 (!) 150/92  05/27/18 (!) 163/96   Single dose of clonidine was given in the office,  10 minutes after administration: BP 170/96   Review of Systems See HPI   Past Medical History:  Diagnosis Date  . Asthma    as a child  . 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  . Diabetes mellitus type 2 in obese ([place])   . GERD (gastroesophageal reflux disease)   . GOUT 07/26/2007  . HYPERCHOLESTEROLEMIA, BORDERLINE 01/16/2010  . HYPERTENSION 07/26/2007  . OSTEOARTHRITIS 09/28/2007  . PLANTAR FASCIITIS, BILATERAL 04/19/2010  . PROSTATITIS, RECURRENT 11/08/2009  . RENAL INSUFFICIENCY 07/26/2007  . Sleep apnea   . Stroke ([place])  01/21/2013  . 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 URINARY FREQUENCY, CHRONIC 03/10/2008    Social History   Socioeconomic History  . Marital status: Married    Spouse name: [name]  . Number of children: 5  . Years of education: 53  . Highest education level: Not on file  Occupational History  . Occupation: TESTOR    Employer: [employer]: retired  Scientific laboratory technician  . Financial resource strain: Not on file  . Food insecurity:    Worry: Not on file    Inability: Not on file  . Transportation needs:    Medical: Not on file    Non-medical: Not on file  Tobacco Use  . Smoking status: Never Smoker  . Smokeless tobacco: Never Used  Substance and Sexual Activity  . Alcohol use: No    Comment: none  . Drug use: No  . Sexual activity: Never  Lifestyle  . Physical activity:    Days per week: Not on file    Minutes per session: Not on file  . Stress: Not on file  Relationships  . Social connections:    Talks on phone: Not on file    Gets together: Not on file    Attends religious service: Not on file    Active member of club or organization: Not on file    Attends meetings of clubs or organizations: Not on file    Relationship status: Not on file  . Intimate partner violence:    Fear of current or ex partner: Not on file    Emotionally abused: Not on file    Physically abused:  Not on file    Forced sexual activity: Not on file  Other Topics Concern  . Not on file  Social History Narrative   He has a part time job for delivering car parts   Married    4 children - All in [place]      He likes to go to car shows and shoot pool.     Past Surgical History:  Procedure Laterality Date  . COLONOSCOPY    . removed cyst from leg    . rt arm surgery for gout    . TONSILLECTOMY      Family History  Problem Relation Age of Onset  . Heart disease Mother   . Heart disease Father   . CVA Father   . Colon polyps Father   . Diabetes Sister   . Colon  cancer Neg Hx   . Esophageal cancer Neg Hx   . Stomach cancer Neg Hx   . Pancreatic cancer Neg Hx   . Rectal cancer Neg Hx     Allergies  Allergen Reactions  . Other Shortness Of Breath and Other (See Comments)    Strawberries     Current Outpatient Medications on File Prior to Visit  Medication Sig Dispense Refill  . allopurinol (ZYLOPRIM) 100 MG tablet TAKE 1 TABLET BY MOUTH EVERY DAY 90 tablet 1  . amLODipine (NORVASC) 10 MG tablet TAKE 1 TABLET BY MOUTH EVERY DAY 90 tablet 0  . aspirin EC 325 MG tablet Take 1 tablet (325 mg total) by mouth daily.  0  . colchicine 0.6 MG tablet Take 0.6 mg by mouth daily as needed.   11  . labetalol (NORMODYNE) 200 MG tablet TAKE 2 TABLETS BY MOUTH TWICE A DAY 360 tablet 3  . labetalol (NORMODYNE) 200 MG tablet Take 2 tablets (400 mg total) by mouth 2 (two) times daily. **NEEDS A PHYSICAL WITH Randye Treichler** 120 tablet 0  . lisinopril (PRINIVIL,ZESTRIL) 40 MG tablet TAKE 1 TABLET BY MOUTH EVERY DAY 90 tablet 3  . metFORMIN (GLUCOPHAGE) 500 MG tablet TAKE 1 TABLET BY MOUTH TWICE A DAY WITH A MEAL.  **NEEDS APPOINTMENT WITH Kendahl Bumgardner** 30 tablet 0  . pantoprazole (PROTONIX) 40 MG tablet Take 1 tablet (40 mg total) by mouth daily. 30 tablet 11   No current facility-administered medications on file prior to visit.     BP (!) 190/110   Temp 98 F (36.7 C)   Wt 226 lb (102.5 kg)   BMI 34.36 kg/m       Objective:   Physical Exam  Constitutional: He is oriented to person, place, and time. He appears well-developed and well-nourished. No distress.  Eyes: Pupils are equal, round, and reactive to light. Conjunctivae and EOM are normal. Right eye exhibits no discharge. Left eye exhibits no discharge. No scleral icterus.  Cardiovascular: Normal rate, regular rhythm, normal heart sounds and intact distal pulses.  Pulmonary/Chest: Effort normal and breath sounds normal.  Musculoskeletal: Normal range of motion.  Neurological: He is alert and oriented to person,  place, and time.  Skin: Skin is dry. He is not diaphoretic.  Psychiatric: He has a normal mood and affect. His behavior is normal. Judgment and thought content normal.  Nursing note and vitals reviewed.     Assessment & Plan:  1. Essential hypertension  - EKG 12-Lead- NSR, rate 66. ST depression with negative t waves.  - spoke to cardiology DOD ( Dr.Berry). He recommended Hydralazine 25 mg BID and his office will contact  him to be seen by Dr. Claiborne Billings.  - BP dropped to 160/88 and I felt as though he was able to safely be discharged today.  - Return precautions given. Advised to go to the ER with chest pain or SOB - cloNIDine (CATAPRES) tablet 0.1 mg - hydrALAZINE (APRESOLINE) 25 MG tablet; Take 1 tablet (25 mg total) by mouth 3 (three) times daily.  Dispense: 90 tablet; Refill: 1  2. Need for prophylactic vaccination and inoculation against influenza  - Flu vaccine HIGH DOSE PF (Fluzone High dose)  Dorothyann Peng, NP

## 2018-12-07 ENCOUNTER — Ambulatory Visit (HOSPITAL_COMMUNITY)
Admission: RE | Admit: 2018-12-07 | Discharge: 2018-12-07 | Disposition: A | Discharge status: Home / Self Care | Payer: Medicare Other | Source: Ambulatory Visit | Attending: Cardiology | Admitting: Cardiology

## 2018-12-07 DIAGNOSIS — I10 Essential (primary) hypertension: Secondary | ICD-10-CM | POA: Diagnosis not present

## 2018-12-13 ENCOUNTER — Other Ambulatory Visit: Payer: Self-pay | Admitting: Adult Health

## 2018-12-13 ENCOUNTER — Telehealth: Payer: Self-pay | Admitting: Adult Health

## 2018-12-13 ENCOUNTER — Ambulatory Visit: Payer: Self-pay | Admitting: *Deleted

## 2018-12-13 NOTE — Telephone Encounter (Signed)
Does this need to be managed by cardiology?

## 2018-12-13 NOTE — Telephone Encounter (Signed)
Patient states he had headache this morning- but does not have it now- discussed warning signs and reasons to go to ED- hypertensive emergency and  not to wait. Patient is aware of warning signs. Reason for Disposition . Systolic BP  >= 407 OR Diastolic >= 680  Answer Assessment - Initial Assessment Questions 1. BLOOD PRESSURE: "What is the blood pressure?" "Did you take at least two measurements 5 minutes apart?"     200/116- this morning- patient states he woke with headache, 185/110 2. ONSET: "When did you take your blood pressure?"     Patient is at drug store 3. HOW: "How did you obtain the blood pressure?" (e.g., visiting nurse, automatic home BP monitor)     Automatic cuff 4. HISTORY: "Do you have a history of high blood pressure?"     yes 5. MEDICATIONS: "Are you taking any medications for blood pressure?" "Have you missed any doses recently?"     Yes- no 6. OTHER SYMPTOMS: "Do you have any symptoms?" (e.g., headache, chest pain, blurred vision, difficulty breathing, weakness)     no 7. PREGNANCY: "Is there any chance you are pregnant?" "When was your last menstrual period?"     n/a  Protocols used: HIGH BLOOD PRESSURE-A-AH

## 2018-12-13 NOTE — Telephone Encounter (Signed)
Copied from [place] [id number]. Topic: General - Other >> Dec 13, 2018  2:07 PM Yvette Rack wrote: Reason for CRM: pt calling to get [place] results

## 2018-12-14 ENCOUNTER — Emergency Department (HOSPITAL_COMMUNITY): Payer: Medicare Other

## 2018-12-14 ENCOUNTER — Other Ambulatory Visit: Payer: Self-pay

## 2018-12-14 ENCOUNTER — Encounter (HOSPITAL_COMMUNITY): Payer: Self-pay | Admitting: Emergency Medicine

## 2018-12-14 ENCOUNTER — Encounter: Payer: Self-pay | Admitting: Internal Medicine

## 2018-12-14 ENCOUNTER — Ambulatory Visit (INDEPENDENT_AMBULATORY_CARE_PROVIDER_SITE_OTHER): Payer: Medicare Other | Admitting: Internal Medicine

## 2018-12-14 ENCOUNTER — Observation Stay (HOSPITAL_COMMUNITY)
Admission: EM | Admit: 2018-12-14 | Discharge: 2018-12-16 | Disposition: A | Discharge status: Home / Self Care | Payer: Medicare Other | Attending: Family Medicine | Admitting: Family Medicine

## 2018-12-14 VITALS — BP 170/100 | HR 77 | Temp 98.4°F | Wt 223.6 lb

## 2018-12-14 DIAGNOSIS — Z8673 Personal history of transient ischemic attack (TIA), and cerebral infarction without residual deficits: Secondary | ICD-10-CM | POA: Diagnosis not present

## 2018-12-14 DIAGNOSIS — Z7984 Long term (current) use of oral hypoglycemic drugs: Secondary | ICD-10-CM | POA: Diagnosis not present

## 2018-12-14 DIAGNOSIS — I161 Hypertensive emergency: Secondary | ICD-10-CM | POA: Diagnosis not present

## 2018-12-14 DIAGNOSIS — G4733 Obstructive sleep apnea (adult) (pediatric): Secondary | ICD-10-CM | POA: Diagnosis not present

## 2018-12-14 DIAGNOSIS — Z7982 Long term (current) use of aspirin: Secondary | ICD-10-CM | POA: Insufficient documentation

## 2018-12-14 DIAGNOSIS — Z79899 Other long term (current) drug therapy: Secondary | ICD-10-CM | POA: Diagnosis not present

## 2018-12-14 DIAGNOSIS — R51 Headache: Secondary | ICD-10-CM | POA: Diagnosis not present

## 2018-12-14 DIAGNOSIS — I10 Essential (primary) hypertension: Secondary | ICD-10-CM | POA: Diagnosis not present

## 2018-12-14 DIAGNOSIS — J45909 Unspecified asthma, uncomplicated: Secondary | ICD-10-CM | POA: Insufficient documentation

## 2018-12-14 DIAGNOSIS — N179 Acute kidney failure, unspecified: Secondary | ICD-10-CM | POA: Diagnosis not present

## 2018-12-14 DIAGNOSIS — E119 Type 2 diabetes mellitus without complications: Secondary | ICD-10-CM | POA: Insufficient documentation

## 2018-12-14 DIAGNOSIS — R413 Other amnesia: Secondary | ICD-10-CM | POA: Diagnosis not present

## 2018-12-14 DIAGNOSIS — F329 Major depressive disorder, single episode, unspecified: Secondary | ICD-10-CM | POA: Diagnosis not present

## 2018-12-14 HISTORY — DX: Gout, unspecified: M10.9

## 2018-12-14 HISTORY — DX: Type 2 diabetes mellitus without complications: E11.9

## 2018-12-14 HISTORY — DX: Unspecified asthma, uncomplicated: J45.909

## 2018-12-14 HISTORY — DX: Obstructive sleep apnea (adult) (pediatric): G47.33

## 2018-12-14 LAB — TSH: TSH: 0.958 u[IU]/mL (ref 0.350–4.500)

## 2018-12-14 LAB — CBC WITH DIFFERENTIAL/PLATELET
Abs Immature Granulocytes: 0.02 10*3/uL (ref 0.00–0.07)
Basophils Absolute: 0.1 10*3/uL (ref 0.0–0.1)
Basophils Relative: 1 %
Eosinophils Absolute: 0.4 10*3/uL (ref 0.0–0.5)
Eosinophils Relative: 6 %
HCT: 36.6 % — ABNORMAL LOW (ref 39.0–52.0)
HEMOGLOBIN: 11.7 g/dL — AB (ref 13.0–17.0)
Immature Granulocytes: 0 %
Lymphocytes Relative: 25 %
Lymphs Abs: 1.6 10*3/uL (ref 0.7–4.0)
MCH: 28.8 pg (ref 26.0–34.0)
MCHC: 32 g/dL (ref 30.0–36.0)
MCV: 90.1 fL (ref 80.0–100.0)
Monocytes Absolute: 0.6 10*3/uL (ref 0.1–1.0)
Monocytes Relative: 10 %
NEUTROS PCT: 58 %
Neutro Abs: 3.7 10*3/uL (ref 1.7–7.7)
Platelets: 291 10*3/uL (ref 150–400)
RBC: 4.06 MIL/uL — ABNORMAL LOW (ref 4.22–5.81)
RDW: 14.2 % (ref 11.5–15.5)
WBC: 6.4 10*3/uL (ref 4.0–10.5)
nRBC: 0 % (ref 0.0–0.2)

## 2018-12-14 LAB — I-STAT CHEM 8, ED
BUN: 29 mg/dL — AB (ref 8–23)
Calcium, Ion: 1.14 mmol/L — ABNORMAL LOW (ref 1.15–1.40)
Chloride: 110 mmol/L (ref 98–111)
Creatinine, Ser: 2.7 mg/dL — ABNORMAL HIGH (ref 0.61–1.24)
Glucose, Bld: 113 mg/dL — ABNORMAL HIGH (ref 70–99)
HCT: 35 % — ABNORMAL LOW (ref 39.0–52.0)
Hemoglobin: 11.9 g/dL — ABNORMAL LOW (ref 13.0–17.0)
POTASSIUM: 4.2 mmol/L (ref 3.5–5.1)
Sodium: 140 mmol/L (ref 135–145)
TCO2: 22 mmol/L (ref 22–32)

## 2018-12-14 LAB — I-STAT TROPONIN, ED: TROPONIN I, POC: 0 ng/mL (ref 0.00–0.08)

## 2018-12-14 LAB — GLUCOSE, CAPILLARY: GLUCOSE-CAPILLARY: 130 mg/dL — AB (ref 70–99)

## 2018-12-14 MED ORDER — HYDRALAZINE HCL 20 MG/ML IJ SOLN
5.0000 mg | Freq: Four times a day (QID) | INTRAMUSCULAR | Status: DC | PRN
  Administered 2018-12-15: 5 mg via INTRAVENOUS
  Filled 2018-12-14 (×2): qty 1

## 2018-12-14 MED ORDER — ASPIRIN EC 325 MG PO TBEC
325.0000 mg | DELAYED_RELEASE_TABLET | Freq: Every day | ORAL | Status: DC
  Administered 2018-12-15 – 2018-12-16 (×2): 325 mg via ORAL
  Filled 2018-12-14 (×2): qty 1

## 2018-12-14 MED ORDER — AMLODIPINE BESYLATE 10 MG PO TABS
10.0000 mg | ORAL_TABLET | Freq: Every day | ORAL | Status: DC
  Administered 2018-12-15 – 2018-12-16 (×2): 10 mg via ORAL
  Filled 2018-12-14 (×2): qty 1

## 2018-12-14 MED ORDER — ACETAMINOPHEN 650 MG RE SUPP: 650.0000 mg | Freq: Four times a day (QID) | RECTAL | Status: DC | PRN

## 2018-12-14 MED ORDER — ENOXAPARIN SODIUM 30 MG/0.3ML ~~LOC~~ SOLN
30.0000 mg | SUBCUTANEOUS | Status: DC
  Administered 2018-12-15 – 2018-12-16 (×2): 30 mg via SUBCUTANEOUS
  Filled 2018-12-14 (×2): qty 0.3

## 2018-12-14 MED ORDER — PANTOPRAZOLE SODIUM 40 MG PO TBEC
40.0000 mg | DELAYED_RELEASE_TABLET | Freq: Every day | ORAL | Status: DC
  Administered 2018-12-15 – 2018-12-16 (×2): 40 mg via ORAL
  Filled 2018-12-14 (×2): qty 1

## 2018-12-14 MED ORDER — ALLOPURINOL 100 MG PO TABS
100.0000 mg | ORAL_TABLET | Freq: Every day | ORAL | Status: DC
  Administered 2018-12-15 – 2018-12-16 (×2): 100 mg via ORAL
  Filled 2018-12-14 (×2): qty 1

## 2018-12-14 MED ORDER — LABETALOL HCL 200 MG PO TABS
400.0000 mg | ORAL_TABLET | Freq: Two times a day (BID) | ORAL | Status: DC
  Filled 2018-12-14: qty 2

## 2018-12-14 MED ORDER — ISOSORB DINITRATE-HYDRALAZINE 20-37.5 MG PO TABS: 1.0000 | ORAL_TABLET | Freq: Three times a day (TID) | ORAL | 3 refills | Status: DC

## 2018-12-14 MED ORDER — SODIUM CHLORIDE 0.9 % IV SOLN: INTRAVENOUS | Status: DC

## 2018-12-14 MED ORDER — PNEUMOCOCCAL VAC POLYVALENT 25 MCG/0.5ML IJ INJ
0.5000 mL | INJECTION | INTRAMUSCULAR | Status: DC
  Filled 2018-12-14: qty 0.5

## 2018-12-14 MED ORDER — PROCHLORPERAZINE EDISYLATE 10 MG/2ML IJ SOLN
10.0000 mg | Freq: Once | INTRAMUSCULAR | Status: AC
  Administered 2018-12-14: 10 mg via INTRAVENOUS
  Filled 2018-12-14: qty 2

## 2018-12-14 MED ORDER — ISOSORBIDE DINITRATE 20 MG PO TABS
20.0000 mg | ORAL_TABLET | Freq: Once | ORAL | Status: AC
  Administered 2018-12-14: 20 mg via ORAL
  Filled 2018-12-14: qty 1

## 2018-12-14 MED ORDER — ISOSORB DINITRATE-HYDRALAZINE 20-37.5 MG PO TABS
1.0000 | ORAL_TABLET | Freq: Three times a day (TID) | ORAL | Status: DC
  Administered 2018-12-15 – 2018-12-16 (×4): 1 via ORAL
  Filled 2018-12-14 (×4): qty 1

## 2018-12-14 MED ORDER — LABETALOL HCL 5 MG/ML IV SOLN
20.0000 mg | Freq: Once | INTRAVENOUS | Status: AC
  Administered 2018-12-14: 20 mg via INTRAVENOUS
  Filled 2018-12-14: qty 4

## 2018-12-14 MED ORDER — ACETAMINOPHEN 325 MG PO TABS
650.0000 mg | ORAL_TABLET | Freq: Four times a day (QID) | ORAL | Status: DC | PRN
  Administered 2018-12-14 – 2018-12-16 (×3): 650 mg via ORAL
  Filled 2018-12-14 (×3): qty 2

## 2018-12-14 MED ORDER — CLONIDINE HCL 0.1 MG PO TABS
0.1000 mg | ORAL_TABLET | Freq: Every day | ORAL | Status: DC
  Administered 2018-12-14 – 2018-12-15 (×2): 0.1 mg via ORAL
  Filled 2018-12-14 (×2): qty 1

## 2018-12-14 MED ORDER — INSULIN ASPART 100 UNIT/ML ~~LOC~~ SOLN: 0.0000 [IU] | Freq: Three times a day (TID) | SUBCUTANEOUS | Status: DC

## 2018-12-14 NOTE — ED Provider Notes (Signed)
[facility] EMERGENCY DEPARTMENT Provider Note   CSN: [id number] Arrival date & time: 12/14/18  1743     History   Chief Complaint No chief complaint on file.   HPI Jared [name] is a 72 y.o. male.  The history is provided by the patient and medical records. No language interpreter was used.     72 year old male with history of hypertension currently on lisinopril, labetalol, clonidine, amlodipine, and isosorbide hydralazine presenting complaining of concern for elevated blood pressure.  Patient endorsed gradual onset of frontal headache ongoing for the past 2 days.  Headache is described as a throbbing sensation, persistent, with mild bilateral blurry vision, and not feeling well.  He also noticed that his blood pressure has been running high in the 182X systolic which concerns him given the fact that he has prior stroke causing transient left vision loss.  He went to his PCP today and states that 1 of his blood pressure medication was increases in dosage but he was told to seek for care if he is concerned.  He currently denies any fever, URI symptoms, diplopia, confusion, neck pain, chest pain, trouble breathing, abdominal pain, back pain, focal numbness or weakness.  He denies any recent sickness and denies any other medication changes.  Patient mention he has been compliant with his medication.  Past Medical History:  Diagnosis Date  . Asthma    as a child  . 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  . Diabetes mellitus type 2 in obese ([place])   . GERD (gastroesophageal reflux disease)   . GOUT 07/26/2007  . HYPERCHOLESTEROLEMIA, BORDERLINE 01/16/2010  . HYPERTENSION 07/26/2007  . OSTEOARTHRITIS 09/28/2007  . PLANTAR FASCIITIS, BILATERAL 04/19/2010  . PROSTATITIS, RECURRENT 11/08/2009  . RENAL INSUFFICIENCY 07/26/2007  . Sleep apnea   . Stroke ([place]) 01/21/2013  . 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 URINARY FREQUENCY, CHRONIC 03/10/2008     Patient Active Problem List   Diagnosis Date Noted  . Hyperlipidemia 05/26/2015  . OSA on CPAP 11/13/2013  . CVA (cerebral infarction) 01/21/2013  . Diabetes mellitus ([place]) 01/21/2013  . Knee pain, bilateral 02/19/2011  . PLANTAR FASCIITIS, BILATERAL 04/19/2010  . UNSPECIFIED DISORDER OF LIPOID METABOLISM 11/08/2009  . 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  . PROSTATITIS, RECURRENT 11/08/2009  . URINARY FREQUENCY, CHRONIC 03/10/2008  . OSTEOARTHRITIS 09/28/2007  . GOUT 07/26/2007  . Essential hypertension 07/26/2007  . Disorder resulting from impaired renal function 07/26/2007    Past Surgical History:  Procedure Laterality Date  . COLONOSCOPY    . removed cyst from leg    . rt arm surgery for gout    . TONSILLECTOMY          Home Medications    Prior to Admission medications   Medication Sig Start Date End Date Taking? Authorizing Provider  allopurinol (ZYLOPRIM) 100 MG tablet TAKE 1 TABLET BY MOUTH EVERY DAY 01/19/17   Nafziger, Tommi Rumps, NP  amLODipine (NORVASC) 10 MG tablet TAKE 1 TABLET BY MOUTH EVERY DAY 12/14/18   Nafziger, Tommi Rumps, NP  aspirin EC 325 MG tablet Take 1 tablet (325 mg total) by mouth daily. 01/23/13   Barton Dubois, MD  colchicine 0.6 MG tablet Take 0.6 mg by mouth daily as needed.  04/30/15   [provider]  isosorbide-hydrALAZINE (BIDIL) 20-37.5 MG tablet Take 1 tablet by mouth 3 (three) times daily. 12/14/18   Isaac Bliss, Rayford Halsted, MD  labetalol (NORMODYNE) 200 MG tablet Take 2 tablets (400 mg  total) by mouth 2 (two) times daily. **NEEDS A PHYSICAL WITH CORY** 11/16/18   Nafziger, Tommi Rumps, NP  lisinopril (PRINIVIL,ZESTRIL) 40 MG tablet TAKE 1 TABLET BY MOUTH EVERY DAY 08/12/18   Nafziger, Tommi Rumps, NP  metFORMIN (GLUCOPHAGE) 500 MG tablet TAKE 1 TABLET BY MOUTH TWICE A DAY WITH A MEAL.  **NEEDS APPOINTMENT WITH CORY** 07/27/18   Nafziger, Tommi Rumps, NP  pantoprazole (PROTONIX) 40 MG tablet Take 1 tablet (40 mg total) by mouth daily. 04/28/18    Irene Shipper, MD    Family History Family History  Problem Relation Age of Onset  . Heart disease Mother   . Heart disease Father   . CVA Father   . Colon polyps Father   . Diabetes Sister   . Colon cancer Neg Hx   . Esophageal cancer Neg Hx   . Stomach cancer Neg Hx   . Pancreatic cancer Neg Hx   . Rectal cancer Neg Hx     Social History Social History   Tobacco Use  . Smoking status: Never Smoker  . Smokeless tobacco: Never Used  Substance Use Topics  . Alcohol use: No    Comment: none  . Drug use: No     Allergies   Other   Review of Systems Review of Systems  All other systems reviewed and are negative.    Physical Exam Updated Vital Signs Ht 5\' 8"  (1.727 m)   BMI 34.00 kg/m   Physical Exam Vitals signs and nursing note reviewed.  Constitutional:      General: He is not in acute distress.    Appearance: He is well-developed.  HENT:     Head: Normocephalic and atraumatic.     Right Ear: Tympanic membrane normal.     Left Ear: Tympanic membrane normal.     Nose: Nose normal.     Mouth/Throat:     Mouth: Mucous membranes are moist.  Eyes:     Extraocular Movements: Extraocular movements intact.     Conjunctiva/sclera: Conjunctivae normal.     Pupils: Pupils are equal, round, and reactive to light.  Neck:     Musculoskeletal: Normal range of motion and neck supple. No neck rigidity.  Cardiovascular:     Rate and Rhythm: Normal rate and regular rhythm.  Pulmonary:     Effort: Pulmonary effort is normal.     Breath sounds: Normal breath sounds.  Abdominal:     Palpations: Abdomen is soft.     Tenderness: There is no abdominal tenderness.  Skin:    Capillary Refill: Capillary refill takes less than 2 seconds.     Findings: No rash.  Neurological:     Mental Status: He is alert and oriented to person, place, and time.     GCS: GCS eye subscore is 4. GCS verbal subscore is 5. GCS motor subscore is 6.     Cranial Nerves: Cranial nerves are  intact.     Sensory: Sensation is intact.     Motor: Motor function is intact.     Coordination: Coordination is intact.     Gait: Gait is intact.      ED Treatments / Results  Labs (all labs ordered are listed, but only abnormal results are displayed) Labs Reviewed  CBC WITH DIFFERENTIAL/PLATELET - Abnormal; Notable for the following components:      Result Value   RBC 4.06 (*)    Hemoglobin 11.7 (*)    HCT 36.6 (*)    All other components within normal  limits  I-STAT CHEM 8, ED - Abnormal; Notable for the following components:   BUN 29 (*)    Creatinine, Ser 2.70 (*)    Glucose, Bld 113 (*)    Calcium, Ion 1.14 (*)    Hemoglobin 11.9 (*)    HCT 35.0 (*)    All other components within normal limits  I-STAT TROPONIN, ED    EKG None  ED ECG REPORT   Date: 12/14/2018  Rate: 80  Rhythm: normal sinus rhythm  QRS Axis: left  Intervals: normal  ST/T Wave abnormalities: early repolarization  Conduction Disutrbances:supraventricular bigeminy  Narrative Interpretation:   Old EKG Reviewed: unchanged  I have personally reviewed the EKG tracing and agree with the computerized printout as noted.   Radiology Ct Head Wo Contrast  Result Date: 12/14/2018 CLINICAL DATA:  Acute headache. EXAM: CT HEAD WITHOUT CONTRAST TECHNIQUE: Contiguous axial images were obtained from the base of the skull through the vertex without intravenous contrast. COMPARISON:  MRI head 01/21/2013 FINDINGS: Brain: Mild atrophy. Negative for hydrocephalus. Negative for acute infarct, hemorrhage, mass Vascular: Negative for hyperdense vessel Skull: Negative Sinuses/Orbits: Negative Other: None IMPRESSION: Mild atrophy.  No acute abnormality. Electronically Signed   By: Franchot Gallo M.D.   On: 12/14/2018 18:44    Procedures .Critical Care Performed by: Domenic Moras, PA-C Authorized by: Domenic Moras, PA-C   Critical care provider statement:    Critical care time (minutes):  45   Critical care was time  spent personally by me on the following activities:  Discussions with consultants, evaluation of patient's response to treatment, examination of patient, ordering and performing treatments and interventions, ordering and review of laboratory studies, ordering and review of radiographic studies, pulse oximetry, re-evaluation of patient's condition, obtaining history from patient or surrogate and review of old charts   (including critical care time)  Medications Ordered in ED Medications  labetalol (NORMODYNE,TRANDATE) injection 20 mg (20 mg Intravenous Given 12/14/18 1904)  isosorbide dinitrate (ISORDIL) tablet 20 mg (20 mg Oral Given 12/14/18 1925)     Initial Impression / Assessment and Plan / ED Course  I have reviewed the triage vital signs and the nursing notes.  Pertinent labs & imaging results that were available during my care of the patient were reviewed by me and considered in my medical decision making (see chart for details).     BP (!) 198/116   Pulse 66   Temp 98.2 F (36.8 C) (Oral)   Resp 16   Ht 5\' 8"  (1.727 m)   SpO2 94%   BMI 34.00 kg/m    Final Clinical Impressions(s) / ED Diagnoses   Final diagnoses:  Hypertensive emergency    ED Discharge Orders    None     6:00 PM Patient with significant history of hypertension currently on 4 different blood pressure medication presenting today with concern for high blood pressure.  He has a documented blood pressure greater than 200 at home using his blood pressure cuff.  He also endorsed throbbing frontal headache for the past 2 days.  He mentioned having prior stroke in the past which concerns him.  Work-up initiated.  7:32 PM Labs remarkable for elevated creatinine of 2.7.  Looking through prior records, his renal function is steadily worsening.  His current blood pressure is greater than 102 systolic.  Head CT scan is unremarkable, EKG without acute ischemic changes.  Patient would benefit from admission for  hypertensive emergency.  Labetalol and BiDil given in the ER. No signs  to suggest CHF.  Appreciate consultation from family medicine resident who agrees to see and admit patient for further management of his hypertensive emergency.  Care discussed with Dr. Venora Maples. Patient voiced understanding and agrees with plan.   Domenic Moras, PA-C 12/14/18 1934    Jola Schmidt, MD 12/14/18 2101

## 2018-12-14 NOTE — Progress Notes (Signed)
Established Patient Office Visit     CC/Reason for Visit: Follow-up on blood pressure  HPI: Jared [name] is a 72 y.o. male who is coming in today for the above mentioned reasons.  He was seen in the office on 12/11 due to elevated blood pressure readings.  He is on lisinopril 40 mg, Norvasc 10 mg and labetalol 400 mg twice daily.  He was started on hydralazine 25 mg 3 times daily at that visit.  An EKG was also done at that visit that showed some ST depression and T wave inversions.  [facility] consulted with the cardiologist to schedule follow-up in their office.  This appointment has been made for December 30.  He apparently had a renal artery stenosis ultrasound ordered by cardiology earlier this week.  He states that his blood pressure readings have continued to be elevated as high as 210/116 at times.  He states he is compliant with his medications, however he is unable to tell me exactly what he takes.  His wife, who accompanies him in for the visit, brings the blood pressure medications in and states that he is taking everything that is in the back.  He has no complaints, specifically no chest pain, shortness of breath, blurred or double vision, headache.  Past Medical/Surgical History: Past Medical History:  Diagnosis Date  . Asthma    as a child  . 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  . Diabetes mellitus type 2 in obese ([place])   . GERD (gastroesophageal reflux disease)   . GOUT 07/26/2007  . HYPERCHOLESTEROLEMIA, BORDERLINE 01/16/2010  . HYPERTENSION 07/26/2007  . OSTEOARTHRITIS 09/28/2007  . PLANTAR FASCIITIS, BILATERAL 04/19/2010  . PROSTATITIS, RECURRENT 11/08/2009  . RENAL INSUFFICIENCY 07/26/2007  . Sleep apnea   . Stroke ([place]) 01/21/2013  . 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 URINARY FREQUENCY, CHRONIC 03/10/2008    Past Surgical History:  Procedure Laterality Date  . COLONOSCOPY    . removed cyst from leg    . rt arm surgery for gout    .  TONSILLECTOMY      Social History:  reports that he has never smoked. He has never used smokeless tobacco. He reports that he does not drink alcohol or use drugs.  Allergies: Allergies  Allergen Reactions  . Other Shortness Of Breath and Other (See Comments)    Strawberries     Family History:  Family History  Problem Relation Age of Onset  . Heart disease Mother   . Heart disease Father   . CVA Father   . Colon polyps Father   . Diabetes Sister   . Colon cancer Neg Hx   . Esophageal cancer Neg Hx   . Stomach cancer Neg Hx   . Pancreatic cancer Neg Hx   . Rectal cancer Neg Hx      Current Outpatient Medications:  .  allopurinol (ZYLOPRIM) 100 MG tablet, TAKE 1 TABLET BY MOUTH EVERY DAY, Disp: 90 tablet, Rfl: 1 .  amLODipine (NORVASC) 10 MG tablet, TAKE 1 TABLET BY MOUTH EVERY DAY, Disp: 90 tablet, Rfl: 1 .  aspirin EC 325 MG tablet, Take 1 tablet (325 mg total) by mouth daily., Disp: , Rfl: 0 .  colchicine 0.6 MG tablet, Take 0.6 mg by mouth daily as needed. , Disp: , Rfl: 11 .  labetalol (NORMODYNE) 200 MG tablet, Take 2 tablets (400 mg total) by mouth 2 (two) times daily. **NEEDS A PHYSICAL WITH CORY**, Disp: 120 tablet, Rfl:  0 .  lisinopril (PRINIVIL,ZESTRIL) 40 MG tablet, TAKE 1 TABLET BY MOUTH EVERY DAY, Disp: 90 tablet, Rfl: 3 .  metFORMIN (GLUCOPHAGE) 500 MG tablet, TAKE 1 TABLET BY MOUTH TWICE A DAY WITH A MEAL.  **NEEDS APPOINTMENT WITH CORY**, Disp: 30 tablet, Rfl: 0 .  pantoprazole (PROTONIX) 40 MG tablet, Take 1 tablet (40 mg total) by mouth daily., Disp: 30 tablet, Rfl: 11 .  isosorbide-hydrALAZINE (BIDIL) 20-37.5 MG tablet, Take 1 tablet by mouth 3 (three) times daily., Disp: 90 tablet, Rfl: 3  Current Facility-Administered Medications:  .  cloNIDine (CATAPRES) tablet 0.1 mg, 0.1 mg, Oral, Once, Nafziger, Cory, NP  Review of Systems:  Constitutional: Denies fever, chills, diaphoresis, appetite change and fatigue.  HEENT: Denies photophobia, eye pain,  redness, hearing loss, ear pain, congestion, sore throat, rhinorrhea, sneezing, mouth sores, trouble swallowing, neck pain, neck stiffness and tinnitus.   Respiratory: Denies SOB, DOE, cough, chest tightness,  and wheezing.   Cardiovascular: Denies chest pain, palpitations and leg swelling.  Gastrointestinal: Denies nausea, vomiting, abdominal pain, diarrhea, constipation, blood in stool and abdominal distention.  Genitourinary: Denies dysuria, urgency, frequency, hematuria, flank pain and difficulty urinating.  Endocrine: Denies: hot or cold intolerance, sweats, changes in hair or nails, polyuria, polydipsia. Musculoskeletal: Denies myalgias, back pain, joint swelling, arthralgias and gait problem.  Skin: Denies pallor, rash and wound.  Neurological: Denies dizziness, seizures, syncope, weakness, light-headedness, numbness and headaches.  Hematological: Denies adenopathy. Easy bruising, personal or family bleeding history  Psychiatric/Behavioral: Denies suicidal ideation, mood changes, confusion, nervousness, sleep disturbance and agitation    Physical Exam: Vitals:   12/14/18 0811  BP: (!) 170/100  Pulse: 77  Temp: 98.4 F (36.9 C)  TempSrc: Oral  SpO2: 96%  Weight: 223 lb 9.6 oz (101.4 kg)    Body mass index is 34 kg/m.   Constitutional: NAD, calm, comfortable Eyes: PERRL, lids and conjunctivae normal ENMT: Mucous membranes are moist.  Respiratory: clear to auscultation bilaterally, no wheezing, no crackles. Normal respiratory effort. No accessory muscle use.  Cardiovascular: Regular rate and rhythm, no murmurs / rubs / gallops. No extremity edema. 2+ pedal pulses. No carotid bruits.   Musculoskeletal: no clubbing / cyanosis. No joint deformity upper and lower extremities. Good ROM, no contractures. Normal muscle tone.  Skin: no rashes, lesions, ulcers. No induration Neurologic: Grossly intact and nonfocal Psychiatric: Normal judgment and insight. Alert and oriented x 3.  Normal mood.    Impression and Plan:  Essential hypertension  -Blood pressure remains elevated today at 170/100. -We will discontinue hydralazine and start him on BiDil 20/37.5 mg 3 times daily. -He is urged to continue blood pressure checks and to follow-up in the office in 2 weeks for continued blood pressure management.  By then he should have seen cardiology and hopefully we will have further recommendations. -Due to lack of signs of endorgan damage, I believe it is safe to discharge him home today.  He is instructed to go to the emergency department if any warning signs become apparent, and he has been instructed in what these may be including but not limited to chest pain, weakness to one side of the body and shortness of breath.    Patient Instructions  -It was nice meeting you today!  -Stop taking hydralazine.  -Keep taking lisinopril, amlodipine and labetalol at current doses.  -Start taking Bidil 1 tablet three times a day.  -Schedule follow up with Tommi Rumps in 2 weeks for continued blood pressure management.  -Please follow thru with  your appointment with cardiology.   DASH Eating Plan DASH stands for "Dietary Approaches to Stop Hypertension." The DASH eating plan is a healthy eating plan that has been shown to reduce high blood pressure (hypertension). It may also reduce your risk for type 2 diabetes, heart disease, and stroke. The DASH eating plan may also help with weight loss. What are tips for following this plan?  General guidelines  Avoid eating more than 2,300 mg (milligrams) of salt (sodium) a day. If you have hypertension, you may need to reduce your sodium intake to 1,500 mg a day.  Limit alcohol intake to no more than 1 drink a day for nonpregnant women and 2 drinks a day for men. One drink equals 12 oz of beer, 5 oz of wine, or 1 oz of hard liquor.  Work with your health care provider to maintain a healthy body weight or to lose weight. Ask what an ideal  weight is for you.  Get at least 30 minutes of exercise that causes your heart to beat faster (aerobic exercise) most days of the week. Activities may include walking, swimming, or biking.  Work with your health care provider or diet and nutrition specialist (dietitian) to adjust your eating plan to your individual calorie needs. Reading food labels   Check food labels for the amount of sodium per serving. Choose foods with less than 5 percent of the Daily Value of sodium. Generally, foods with less than 300 mg of sodium per serving fit into this eating plan.  To find whole grains, look for the word "whole" as the first word in the ingredient list. Shopping  Buy products labeled as "low-sodium" or "no salt added."  Buy fresh foods. Avoid canned foods and premade or frozen meals. Cooking  Avoid adding salt when cooking. Use salt-free seasonings or herbs instead of table salt or sea salt. Check with your health care provider or pharmacist before using salt substitutes.  Do not fry foods. Cook foods using healthy methods such as baking, boiling, grilling, and broiling instead.  Cook with heart-healthy oils, such as olive, canola, soybean, or sunflower oil. Meal planning  Eat a balanced diet that includes: ? 5 or more servings of fruits and vegetables each day. At each meal, try to fill half of your plate with fruits and vegetables. ? Up to 6-8 servings of whole grains each day. ? Less than 6 oz of lean meat, poultry, or fish each day. A 3-oz serving of meat is about the same size as a deck of cards. One egg equals 1 oz. ? 2 servings of low-fat dairy each day. ? A serving of nuts, seeds, or beans 5 times each week. ? Heart-healthy fats. Healthy fats called Omega-3 fatty acids are found in foods such as flaxseeds and coldwater fish, like sardines, salmon, and mackerel.  Limit how much you eat of the following: ? Canned or prepackaged foods. ? Food that is high in trans fat, such as  fried foods. ? Food that is high in saturated fat, such as fatty meat. ? Sweets, desserts, sugary drinks, and other foods with added sugar. ? Full-fat dairy products.  Do not salt foods before eating.  Try to eat at least 2 vegetarian meals each week.  Eat more home-cooked food and less restaurant, buffet, and fast food.  When eating at a restaurant, ask that your food be prepared with less salt or no salt, if possible. What foods are recommended? The items listed may not be a  complete list. Talk with your dietitian about what dietary choices are best for you. Grains Whole-grain or whole-wheat bread. Whole-grain or whole-wheat pasta. Brown rice. [place]. Bulgur. Whole-grain and low-sodium cereals. Pita bread. Low-fat, low-sodium crackers. Whole-wheat flour tortillas. Vegetables Fresh or frozen vegetables (raw, steamed, roasted, or grilled). Low-sodium or reduced-sodium tomato and vegetable juice. Low-sodium or reduced-sodium tomato sauce and tomato paste. Low-sodium or reduced-sodium canned vegetables. Fruits All fresh, dried, or frozen fruit. Canned fruit in natural juice (without added sugar). Meat and other protein foods Skinless chicken or [place]. Ground chicken or [place]. Pork with fat trimmed off. Fish and seafood. Egg whites. Dried beans, peas, or lentils. Unsalted nuts, nut butters, and seeds. Unsalted canned beans. Lean cuts of beef with fat trimmed off. Low-sodium, lean deli meat. Dairy Low-fat (1%) or fat-free (skim) milk. Fat-free, low-fat, or reduced-fat cheeses. Nonfat, low-sodium ricotta or cottage cheese. Low-fat or nonfat yogurt. Low-fat, low-sodium cheese. Fats and oils Soft margarine without trans fats. Vegetable oil. Low-fat, reduced-fat, or light mayonnaise and salad dressings (reduced-sodium). Canola, safflower, olive, soybean, and sunflower oils. Avocado. Seasoning and other foods Herbs. Spices. Seasoning mixes without salt. Unsalted popcorn and pretzels.  Fat-free sweets. What foods are not recommended? The items listed may not be a complete list. Talk with your dietitian about what dietary choices are best for you. Grains Baked goods made with fat, such as croissants, muffins, or some breads. Dry pasta or rice meal packs. Vegetables Creamed or fried vegetables. Vegetables in a cheese sauce. Regular canned vegetables (not low-sodium or reduced-sodium). Regular canned tomato sauce and paste (not low-sodium or reduced-sodium). Regular tomato and vegetable juice (not low-sodium or reduced-sodium). Angie Fava. Olives. Fruits Canned fruit in a light or heavy syrup. Fried fruit. Fruit in cream or butter sauce. Meat and other protein foods Fatty cuts of meat. Ribs. Fried meat. Berniece Salines. Sausage. Bologna and other processed lunch meats. Salami. Fatback. Hotdogs. Bratwurst. Salted nuts and seeds. Canned beans with added salt. Canned or smoked fish. Whole eggs or egg yolks. Chicken or [place] with skin. Dairy Whole or 2% milk, cream, and half-and-half. Whole or full-fat cream cheese. Whole-fat or sweetened yogurt. Full-fat cheese. Nondairy creamers. Whipped toppings. Processed cheese and cheese spreads. Fats and oils Butter. Stick margarine. Lard. Shortening. Ghee. Bacon fat. Tropical oils, such as coconut, palm kernel, or palm oil. Seasoning and other foods Salted popcorn and pretzels. Onion salt, garlic salt, seasoned salt, table salt, and sea salt. Worcestershire sauce. Tartar sauce. Barbecue sauce. Teriyaki sauce. Soy sauce, including reduced-sodium. Steak sauce. Canned and packaged gravies. Fish sauce. Oyster sauce. Cocktail sauce. Horseradish that you find on the shelf. Ketchup. Mustard. Meat flavorings and tenderizers. Bouillon cubes. Hot sauce and Tabasco sauce. Premade or packaged marinades. Premade or packaged taco seasonings. Relishes. Regular salad dressings. Where to find more information:  National Heart, Lung, and [place]:  https://wilson-eaton.com/  American Heart Association: www.heart.org Summary  The DASH eating plan is a healthy eating plan that has been shown to reduce high blood pressure (hypertension). It may also reduce your risk for type 2 diabetes, heart disease, and stroke.  With the DASH eating plan, you should limit salt (sodium) intake to 2,300 mg a day. If you have hypertension, you may need to reduce your sodium intake to 1,500 mg a day.  When on the DASH eating plan, aim to eat more fresh fruits and vegetables, whole grains, lean proteins, low-fat dairy, and heart-healthy fats.  Work with your health care provider or diet and nutrition specialist (dietitian)  to adjust your eating plan to your individual calorie needs. This information is not intended to replace advice given to you by your health care provider. Make sure you discuss any questions you have with your health care provider. Document Released: 11/27/2011 Document Revised: 12/01/2016 Document Reviewed: 12/01/2016 Elsevier Interactive Patient Education  2019 [place], [place] [facility]

## 2018-12-14 NOTE — Patient Instructions (Addendum)
-It was nice meeting you today!  -Stop taking hydralazine.  -Keep taking lisnopril and labetalol at current doses.  -Start taking Bidil 1 tablet three times a day.  -Schedule follow up with Tommi Rumps in 2 weeks for continued blood pressure management.  -Please follow thru with your appointment with cardiology.   DASH Eating Plan DASH stands for "Dietary Approaches to Stop Hypertension." The DASH eating plan is a healthy eating plan that has been shown to reduce high blood pressure (hypertension). It may also reduce your risk for type 2 diabetes, heart disease, and stroke. The DASH eating plan may also help with weight loss. What are tips for following this plan?  General guidelines  Avoid eating more than 2,300 mg (milligrams) of salt (sodium) a day. If you have hypertension, you may need to reduce your sodium intake to 1,500 mg a day.  Limit alcohol intake to no more than 1 drink a day for nonpregnant women and 2 drinks a day for men. One drink equals 12 oz of beer, 5 oz of wine, or 1 oz of hard liquor.  Work with your health care provider to maintain a healthy body weight or to lose weight. Ask what an ideal weight is for you.  Get at least 30 minutes of exercise that causes your heart to beat faster (aerobic exercise) most days of the week. Activities may include walking, swimming, or biking.  Work with your health care provider or diet and nutrition specialist (dietitian) to adjust your eating plan to your individual calorie needs. Reading food labels   Check food labels for the amount of sodium per serving. Choose foods with less than 5 percent of the Daily Value of sodium. Generally, foods with less than 300 mg of sodium per serving fit into this eating plan.  To find whole grains, look for the word "whole" as the first word in the ingredient list. Shopping  Buy products labeled as "low-sodium" or "no salt added."  Buy fresh foods. Avoid canned foods and premade or frozen  meals. Cooking  Avoid adding salt when cooking. Use salt-free seasonings or herbs instead of table salt or sea salt. Check with your health care provider or pharmacist before using salt substitutes.  Do not fry foods. Cook foods using healthy methods such as baking, boiling, grilling, and broiling instead.  Cook with heart-healthy oils, such as olive, canola, soybean, or sunflower oil. Meal planning  Eat a balanced diet that includes: ? 5 or more servings of fruits and vegetables each day. At each meal, try to fill half of your plate with fruits and vegetables. ? Up to 6-8 servings of whole grains each day. ? Less than 6 oz of lean meat, poultry, or fish each day. A 3-oz serving of meat is about the same size as a deck of cards. One egg equals 1 oz. ? 2 servings of low-fat dairy each day. ? A serving of nuts, seeds, or beans 5 times each week. ? Heart-healthy fats. Healthy fats called Omega-3 fatty acids are found in foods such as flaxseeds and coldwater fish, like sardines, salmon, and mackerel.  Limit how much you eat of the following: ? Canned or prepackaged foods. ? Food that is high in trans fat, such as fried foods. ? Food that is high in saturated fat, such as fatty meat. ? Sweets, desserts, sugary drinks, and other foods with added sugar. ? Full-fat dairy products.  Do not salt foods before eating.  Try to eat at least 2  vegetarian meals each week.  Eat more home-cooked food and less restaurant, buffet, and fast food.  When eating at a restaurant, ask that your food be prepared with less salt or no salt, if possible. What foods are recommended? The items listed may not be a complete list. Talk with your dietitian about what dietary choices are best for you. Grains Whole-grain or whole-wheat bread. Whole-grain or whole-wheat pasta. Brown rice. [place]. Bulgur. Whole-grain and low-sodium cereals. Pita bread. Low-fat, low-sodium crackers. Whole-wheat flour  tortillas. Vegetables Fresh or frozen vegetables (raw, steamed, roasted, or grilled). Low-sodium or reduced-sodium tomato and vegetable juice. Low-sodium or reduced-sodium tomato sauce and tomato paste. Low-sodium or reduced-sodium canned vegetables. Fruits All fresh, dried, or frozen fruit. Canned fruit in natural juice (without added sugar). Meat and other protein foods Skinless chicken or [place]. Ground chicken or [place]. Pork with fat trimmed off. Fish and seafood. Egg whites. Dried beans, peas, or lentils. Unsalted nuts, nut butters, and seeds. Unsalted canned beans. Lean cuts of beef with fat trimmed off. Low-sodium, lean deli meat. Dairy Low-fat (1%) or fat-free (skim) milk. Fat-free, low-fat, or reduced-fat cheeses. Nonfat, low-sodium ricotta or cottage cheese. Low-fat or nonfat yogurt. Low-fat, low-sodium cheese. Fats and oils Soft margarine without trans fats. Vegetable oil. Low-fat, reduced-fat, or light mayonnaise and salad dressings (reduced-sodium). Canola, safflower, olive, soybean, and sunflower oils. Avocado. Seasoning and other foods Herbs. Spices. Seasoning mixes without salt. Unsalted popcorn and pretzels. Fat-free sweets. What foods are not recommended? The items listed may not be a complete list. Talk with your dietitian about what dietary choices are best for you. Grains Baked goods made with fat, such as croissants, muffins, or some breads. Dry pasta or rice meal packs. Vegetables Creamed or fried vegetables. Vegetables in a cheese sauce. Regular canned vegetables (not low-sodium or reduced-sodium). Regular canned tomato sauce and paste (not low-sodium or reduced-sodium). Regular tomato and vegetable juice (not low-sodium or reduced-sodium). Angie Fava. Olives. Fruits Canned fruit in a light or heavy syrup. Fried fruit. Fruit in cream or butter sauce. Meat and other protein foods Fatty cuts of meat. Ribs. Fried meat. Berniece Salines. Sausage. Bologna and other processed lunch meats.  Salami. Fatback. Hotdogs. Bratwurst. Salted nuts and seeds. Canned beans with added salt. Canned or smoked fish. Whole eggs or egg yolks. Chicken or [place] with skin. Dairy Whole or 2% milk, cream, and half-and-half. Whole or full-fat cream cheese. Whole-fat or sweetened yogurt. Full-fat cheese. Nondairy creamers. Whipped toppings. Processed cheese and cheese spreads. Fats and oils Butter. Stick margarine. Lard. Shortening. Ghee. Bacon fat. Tropical oils, such as coconut, palm kernel, or palm oil. Seasoning and other foods Salted popcorn and pretzels. Onion salt, garlic salt, seasoned salt, table salt, and sea salt. Worcestershire sauce. Tartar sauce. Barbecue sauce. Teriyaki sauce. Soy sauce, including reduced-sodium. Steak sauce. Canned and packaged gravies. Fish sauce. Oyster sauce. Cocktail sauce. Horseradish that you find on the shelf. Ketchup. Mustard. Meat flavorings and tenderizers. Bouillon cubes. Hot sauce and Tabasco sauce. Premade or packaged marinades. Premade or packaged taco seasonings. Relishes. Regular salad dressings. Where to find more information:  National Heart, Lung, and [place]: https://wilson-eaton.com/  American Heart Association: www.heart.org Summary  The DASH eating plan is a healthy eating plan that has been shown to reduce high blood pressure (hypertension). It may also reduce your risk for type 2 diabetes, heart disease, and stroke.  With the DASH eating plan, you should limit salt (sodium) intake to 2,300 mg a day. If you have hypertension, you may need  to reduce your sodium intake to 1,500 mg a day.  When on the DASH eating plan, aim to eat more fresh fruits and vegetables, whole grains, lean proteins, low-fat dairy, and heart-healthy fats.  Work with your health care provider or diet and nutrition specialist (dietitian) to adjust your eating plan to your individual calorie needs. This information is not intended to replace advice given to you by your health  care provider. Make sure you discuss any questions you have with your health care provider. Document Released: 11/27/2011 Document Revised: 12/01/2016 Document Reviewed: 12/01/2016 Elsevier Interactive Patient Education  2019 [employer].

## 2018-12-14 NOTE — ED Triage Notes (Addendum)
Patient arrived from home reporting high Blood pressure. He reports at home BP was 224/116. Patient also reports his head and teech hurt, he rates it at 10 on a 0-10 scale  Patient is A&O X 4.

## 2018-12-14 NOTE — Telephone Encounter (Signed)
I do not have any recent US results. How has his blood pressure been at home? If they continue to be elevated then I would like to refer to nephrology and get a repeat kidney US done as his last was in 2015

## 2018-12-14 NOTE — Telephone Encounter (Signed)
Noted  

## 2018-12-14 NOTE — H&P (Signed)
Patient seen and evaluated by me around 7 -8 PM today. I have discussed patient with the resident. I will cosign their note once it is done.

## 2018-12-14 NOTE — ED Notes (Signed)
Admitting at bedside 

## 2018-12-14 NOTE — Telephone Encounter (Signed)
Patient came in for office visit 12/14/18.  [place] results will be given at cardiology follow up.

## 2018-12-14 NOTE — H&P (Addendum)
[facility] Admission History and Physical Service Pager: [phone]  Patient name: Jared [name] Medical record number: [id number] Date of birth: 1946/05/29 Age: 72 y.o. Gender: male  Primary Care Provider: Dorothyann Peng, NP Consultants: None  Code Status: Full   Chief Complaint: Headache, uncontrolled hypertension  Assessment and Plan: Jared [name] is a 72 y.o. male presenting with a one-week history of progressively worsening headache associated with severely elevated blood pressures, found to be in hypertensive emergency. PMH is significant for chronic hypertension, previous CVA, type 2 diabetes, CKD, OSA not on CPAP, hyperlipidemia, and gout.  Hypertensive Emergency in the setting of chronic hypertension: Acute, worsening.  Pt reports a 1 week history of progressively worsening frontal headache and blurry vision in the setting of elevated blood pressures to 858I systolic and >502D diastolic, in spite of taking home medications.  Went to his PCP this am, started on BiDil and discontinued his hydralazine.  Max BP in ED 219/112. Otherwise, VSS.  No focal neuro deficits on exam.  CT head without acute intracranial abnormalities.  Associated with acute kidney injury, CR 2.7, from 2.28 a week ago.  I-STAT troponin 0.00, EKG sinus, similar to previous. Renal artery duplex on 12/07/18 without evidence of renal artery stenosis.  Concern for hypertensive emergency in the setting of elevated blood pressures with associated blurry vision, HA, and AKI.  Reassured no abnormalities on CT head, low concern for any pulmonary, cardiac, or abdominal manifestations as patient is without symptomatology, has unremarkable physical exam, and no changes on EKG/troponin.  Increasing hypertension likely in the setting of advanced age, could also consider an additional contribution of OSA given patient is not wearing his CPAP.  Unlikely medicine noncompliance, family and patient are adamant  that he takes his medications daily. - Admit to [facility], attending Dr. [name] - S/p labetalol 20 IV and isordil 20mg  in the ED, will monitor BP, will add hydralazine 5 mg every 6 as needed - Continue home BiDil 20-37.5 3 times daily and amlodipine 10 mg to start 12/25 - Start clonidine 0.1 mg daily - We will consider starting on labetalol drip if BP is persistently around 200/100 over the next few hours - Obtain echocardiogram since last echo was in 2016 - Hold home labetalol in the setting of bradycardia with IV labetalol already given - Hold home lisinopril in setting of AKI - Monitor BMP - Salt restricted diet -Vitals per routine, monitor CM  - Tylenol as needed for headache, given Compazine 10 mg once for HA  AKI on CKD: Progressing. Cr 2.7 on admit. Cr 2.28 on 12/11.  Baseline appears around 2.1.  AKI likely in the setting of hypertension as above, with CKD also likely due to uncontrolled chronic hypertension. - Control blood pressure as above - Monitor BMP - Avoid nephrotoxic medications as possible - Encourage fluid rehydration, euvolemic on exam - Obtain FENa if AKI does not resolve with blood pressure control  Previous CVA in 2014: Chronic, stable.  No residual deficits.  Reportedly lost his bilateral vision, however fortunately this spontaneously resolved.  PCP note states he is taking atorvastatin, however not on home medication list. - Continue home aspirin 325 mg - Follow-up on statin use  Diabetes Mellitus, type 2: Chronic, well-controlled.  A1c 5.5 in 08/2018.  CBG 113. Takes metformin 500 mg daily at home.   - Hold home metformin while inpatient - Sensitive SSI - CBGs before meals and nightly     OSA, currently not on CPAP:  Chronic. Pt reports he felt like he could not breathe with the CPAP.  Has not been using for quite some time.  May be contributing to hypertension. - Consider CPAP, or nasal cannula therapy  Concern for depression: Subacute.  Daughter voices  concern that her father is depressed.  States he appears sad, has minimal sleep, and decreased appetite.  Affect appropriate on exam. - Ensure follow-up with PCP outpatient for depressive symptom/therapy  - Could consider PHQ 9  Concern for memory loss: Subacute.  Also per daughter, notes he is more forgetful and confused about daily tasks.  Alert and oriented x3 on exam.  May be related to mental decline with age.  -Ensure follow-up with PCP outpatient for further work-up - Consider Moca  Hyperlipidemia: Chronic, stable. LDL 82 in 2018.  Reportedly on statin, will follow-up. -Follow-up statin use  Gout: Chronic, stable.  No current flare.  Takes allopurinol. - Continue home allopurinol 100 mg daily -Monitor for symptoms  FEN/GI: Salt restricted diet, encourage fluid intake Prophylaxis: Lovenox  Disposition: Admit to [facility], attending Dr. [name]  History of Present Illness:  Jared [name] is a 72 y.o. male with a history significant for uncontrolled hypertension presenting with a headache after noticing high blood pressures at home.  He went to his PCP today due to high blood pressure readings at home and was found to have blood pressures around 200/100s.  He was started on BiDil, told to stop hydralazine, and cautioned by his PCP to go to the emergency department if he had systolic blood pressure >308.  When he measured his blood pressure this evening, found it to be 223/140, so he presented for further evaluation in the ED.    In addition to high blood pressure , he also reports a progressive frontal headache for the past week.  He describes it as a constant pressure that has further worsened today.  He is tried some Goody powder, however minimally helpful.  He also endorses some blurry vision that started today.  Denies any lightheadedness, dizziness, numbness/tingling, focal weakness, chest pain, shortness of breath, or abdominal pain.  Daughter additionally adds that she has noticed  that he seems depressed, not sleeping well, and has less appetite.  She also states that for the past few nights she is knows he has been more forgetful and more confused about daily tasks.  ED Course: On arrival, he was in some distress due to headache, hemodynamically stable with the exception of elevated blood pressures into the 657Q systolic.  CT head without acute intracranial abnormalities.  Labs notable for BUN 29, creatinine 2.7, hemoglobin 11.9, WBC 6.4.  EKG sinus rhythm with ventricular bigeminy, similar to previous.  Troponin POC 0.00.  He was given labetalol 20 mg IV and isordil 20 mg p.o. while in the ED, with minimal change in his blood pressure.  Review Of Systems: Per HPI with the following additions:   Review of Systems  Eyes: Positive for blurred vision and photophobia.  Respiratory: Negative for shortness of breath.   Cardiovascular: Negative for chest pain.  Gastrointestinal: Positive for nausea. Negative for abdominal pain.  Neurological: Positive for headaches.    Patient Active Problem List   Diagnosis Date Noted  . Hyperlipidemia 05/26/2015  . OSA on CPAP 11/13/2013  . CVA (cerebral infarction) 01/21/2013  . Diabetes mellitus ([place]) 01/21/2013  . Knee pain, bilateral 02/19/2011  . PLANTAR FASCIITIS, BILATERAL 04/19/2010  . UNSPECIFIED DISORDER OF LIPOID METABOLISM 11/08/2009  . CHRONIC GOUTY ARTHROPATHY WITH  TOPHUS 11/08/2009  . PROSTATITIS, RECURRENT 11/08/2009  . URINARY FREQUENCY, CHRONIC 03/10/2008  . OSTEOARTHRITIS 09/28/2007  . GOUT 07/26/2007  . Essential hypertension 07/26/2007  . Disorder resulting from impaired renal function 07/26/2007    Past Medical History: Past Medical History:  Diagnosis Date  . Asthma    as a child  . 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  . Diabetes mellitus type 2 in obese ([place])   . GERD (gastroesophageal reflux disease)   . GOUT 07/26/2007  . HYPERCHOLESTEROLEMIA, BORDERLINE 01/16/2010  . HYPERTENSION  07/26/2007  . OSTEOARTHRITIS 09/28/2007  . PLANTAR FASCIITIS, BILATERAL 04/19/2010  . PROSTATITIS, RECURRENT 11/08/2009  . RENAL INSUFFICIENCY 07/26/2007  . Sleep apnea   . Stroke ([place]) 01/21/2013  . 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 URINARY FREQUENCY, CHRONIC 03/10/2008    Past Surgical History: Past Surgical History:  Procedure Laterality Date  . COLONOSCOPY    . removed cyst from leg    . rt arm surgery for gout    . TONSILLECTOMY      Social History: Social History   Tobacco Use  . Smoking status: Never Smoker  . Smokeless tobacco: Never Used  Substance Use Topics  . Alcohol use: No    Comment: none  . Drug use: No   Please also refer to relevant sections of EMR.  Family History: Family History  Problem Relation Age of Onset  . Heart disease Mother   . Heart disease Father   . CVA Father   . Colon polyps Father   . Diabetes Sister   . Colon cancer Neg Hx   . Esophageal cancer Neg Hx   . Stomach cancer Neg Hx   . Pancreatic cancer Neg Hx   . Rectal cancer Neg Hx     Allergies and Medications: Allergies  Allergen Reactions  . Other Shortness Of Breath and Other (See Comments)    Strawberries    Current Facility-Administered Medications on File Prior to Encounter  Medication Dose Route Frequency Provider Last Rate Last Dose  . cloNIDine (CATAPRES) tablet 0.1 mg  0.1 mg Oral Once Dorothyann Peng, NP       Current Outpatient Medications on File Prior to Encounter  Medication Sig Dispense Refill  . allopurinol (ZYLOPRIM) 100 MG tablet TAKE 1 TABLET BY MOUTH EVERY DAY 90 tablet 1  . amLODipine (NORVASC) 10 MG tablet TAKE 1 TABLET BY MOUTH EVERY DAY 90 tablet 1  . aspirin EC 325 MG tablet Take 1 tablet (325 mg total) by mouth daily.  0  . colchicine 0.6 MG tablet Take 0.6 mg by mouth daily as needed.   11  . isosorbide-hydrALAZINE (BIDIL) 20-37.5 MG tablet Take 1 tablet by mouth 3 (three) times daily. 90 tablet 3  . labetalol (NORMODYNE) 200  MG tablet Take 2 tablets (400 mg total) by mouth 2 (two) times daily. **NEEDS A PHYSICAL WITH CORY** 120 tablet 0  . lisinopril (PRINIVIL,ZESTRIL) 40 MG tablet TAKE 1 TABLET BY MOUTH EVERY DAY 90 tablet 3  . metFORMIN (GLUCOPHAGE) 500 MG tablet TAKE 1 TABLET BY MOUTH TWICE A DAY WITH A MEAL.  **NEEDS APPOINTMENT WITH CORY** 30 tablet 0  . pantoprazole (PROTONIX) 40 MG tablet Take 1 tablet (40 mg total) by mouth daily. 30 tablet 11    Objective: BP (!) 198/116   Pulse 66   Temp 98.2 F (36.8 C) (Oral)   Resp 16   Ht 5\' 8"  (1.727 m)   SpO2 94%   BMI 34.00  kg/m  Exam: General: Alert, in mild distress due to HA, ice pack on forehead, eyes closed HEENT: NCAT, MMM, EOMI.  PERRLA. Cardiac: RRR no m/g/r Lungs: Clear bilaterally, no increased WOB  Abdomen: soft, non-tender, non-distended, normoactive BS Msk: Moves all extremities spontaneously  Ext: Warm, dry, 2+ distal pulses, trace edema Neuro: Alert and oriented x3, speech appropriate able to respond to questions, follows commands.  CN II-XII intact.  5/5 muscle strength upper arm and lower bilaterally.  Sensation to light touch intact. Psych: Appropriate affect Derm: No rashes or bruises noted  Labs and Imaging: CBC BMET  Recent Labs  Lab 12/14/18 1805 12/14/18 1814  WBC 6.4  --   HGB 11.7* 11.9*  HCT 36.6* 35.0*  PLT 291  --    Recent Labs  Lab 12/14/18 1814  NA 140  K 4.2  CL 110  BUN 29*  CREATININE 2.70*  GLUCOSE 113*     Ct Head Wo Contrast  Result Date: 12/14/2018 CLINICAL DATA:  Acute headache. EXAM: CT HEAD WITHOUT CONTRAST TECHNIQUE: Contiguous axial images were obtained from the base of the skull through the vertex without intravenous contrast. COMPARISON:  MRI head 01/21/2013 FINDINGS: Brain: Mild atrophy. Negative for hydrocephalus. Negative for acute infarct, hemorrhage, mass Vascular: Negative for hyperdense vessel Skull: Negative Sinuses/Orbits: Negative Other: None IMPRESSION: Mild atrophy.  No acute  abnormality. Electronically Signed   By: Franchot Gallo M.D.   On: 12/14/2018 18:44    Patriciaann Clan, DO 12/14/2018, 7:32 PM PGY-1, [place] Intern pager: [phone], text pages welcome  FPTS Upper-Level Resident Addendum   I have independently interviewed and examined the patient. I have discussed the above with the original author and agree with their documentation. My edits for correction/addition/clarification are in green. Please see also any attending notes.    Kathrene Alu, MD PGY-2, [facility] 12/14/2018 10:11 PM  FPTS Service pager: [phone] (text pages welcome through [facility])

## 2018-12-14 NOTE — Telephone Encounter (Signed)
I see that he had them done by Cardiology. It looks like Cardiology will discuss the results with him after they are read and he is seen in January

## 2018-12-15 ENCOUNTER — Observation Stay (HOSPITAL_BASED_OUTPATIENT_CLINIC_OR_DEPARTMENT_OTHER): Payer: Medicare Other

## 2018-12-15 ENCOUNTER — Encounter (HOSPITAL_COMMUNITY): Payer: Self-pay

## 2018-12-15 DIAGNOSIS — I361 Nonrheumatic tricuspid (valve) insufficiency: Secondary | ICD-10-CM

## 2018-12-15 DIAGNOSIS — I370 Nonrheumatic pulmonary valve stenosis: Secondary | ICD-10-CM | POA: Diagnosis not present

## 2018-12-15 DIAGNOSIS — I340 Nonrheumatic mitral (valve) insufficiency: Secondary | ICD-10-CM

## 2018-12-15 DIAGNOSIS — I161 Hypertensive emergency: Secondary | ICD-10-CM | POA: Diagnosis not present

## 2018-12-15 LAB — BASIC METABOLIC PANEL
Anion gap: 10 (ref 5–15)
BUN: 30 mg/dL — ABNORMAL HIGH (ref 8–23)
CO2: 22 mmol/L (ref 22–32)
Calcium: 8.6 mg/dL — ABNORMAL LOW (ref 8.9–10.3)
Chloride: 107 mmol/L (ref 98–111)
Creatinine, Ser: 2.8 mg/dL — ABNORMAL HIGH (ref 0.61–1.24)
GFR calc Af Amer: 25 mL/min — ABNORMAL LOW (ref >60)
GFR calc non Af Amer: 22 mL/min — ABNORMAL LOW (ref >60)
Glucose, Bld: 103 mg/dL — ABNORMAL HIGH (ref 70–99)
Potassium: 4.2 mmol/L (ref 3.5–5.1)
Sodium: 139 mmol/L (ref 135–145)

## 2018-12-15 LAB — CBC
HCT: 31.6 % — ABNORMAL LOW (ref 39.0–52.0)
Hemoglobin: 10.3 g/dL — ABNORMAL LOW (ref 13.0–17.0)
MCH: 28.9 pg (ref 26.0–34.0)
MCHC: 32.6 g/dL (ref 30.0–36.0)
MCV: 88.5 fL (ref 80.0–100.0)
Platelets: 248 10*3/uL (ref 150–400)
RBC: 3.57 MIL/uL — ABNORMAL LOW (ref 4.22–5.81)
RDW: 14 % (ref 11.5–15.5)
WBC: 7.9 10*3/uL (ref 4.0–10.5)
nRBC: 0 % (ref 0.0–0.2)

## 2018-12-15 LAB — ECHOCARDIOGRAM COMPLETE
Height: 68 in
WEIGHTICAEL: 3731.95 [oz_av]

## 2018-12-15 LAB — GLUCOSE, CAPILLARY
GLUCOSE-CAPILLARY: 112 mg/dL — AB (ref 70–99)
Glucose-Capillary: 87 mg/dL (ref 70–99)
Glucose-Capillary: 116 mg/dL — ABNORMAL HIGH (ref 70–99)
Glucose-Capillary: 108 mg/dL — ABNORMAL HIGH (ref 70–99)

## 2018-12-15 MED ORDER — LABETALOL HCL 200 MG PO TABS
200.0000 mg | ORAL_TABLET | Freq: Once | ORAL | Status: AC
  Administered 2018-12-15: 200 mg via ORAL
  Filled 2018-12-15: qty 1

## 2018-12-15 MED ORDER — LABETALOL HCL 200 MG PO TABS
400.0000 mg | ORAL_TABLET | Freq: Two times a day (BID) | ORAL | Status: DC
  Administered 2018-12-15 – 2018-12-16 (×2): 400 mg via ORAL
  Filled 2018-12-15 (×2): qty 2

## 2018-12-15 MED ORDER — LABETALOL HCL 200 MG PO TABS
200.0000 mg | ORAL_TABLET | Freq: Two times a day (BID) | ORAL | Status: DC
  Administered 2018-12-15: 200 mg via ORAL
  Filled 2018-12-15: qty 1

## 2018-12-15 NOTE — Progress Notes (Signed)
Family Medicine Teaching Service Daily Progress Note Intern Pager: [phone]  Patient name: Jared [name] record number: [id number] Date of birth: 24-May-1946 Age: 72 y.o. Gender: male  Primary Care Provider: Dorothyann Peng, NP Consultants: None Code Status: Full  Pt Overview and Major Events to Date:  Admitted 12/24  Assessment and Plan: Jared [name] is a 72 y.o. male presenting with a one-week history of progressively worsening headache associated with severely elevated blood pressures, found to be in hypertensive emergency. PMH is significant for chronic hypertension, previous CVA, type 2 diabetes, CKD, OSA not on CPAP, hyperlipidemia, and gout.  Hypertensive emergency in the setting of chronic hypertension  Patient reports compliance with home medications, however blood pressure drastically improved after receiving home medications.  Patient also unable to tell me all of the medications he is on the proper dosing.  States he has been lined up in his cabinet at home and does not have a pillbox.  Patient was started on BiDil and his hydralazine was discontinued on 12/23.  Max BP 219/112, now improved to 145/90. Otherwise, VSS.  Renal artery duplex on 12/07/18 without evidence of renal artery stenosis.  Increasing hypertension likely in the setting of advanced age, could also consider an additional contribution of OSA given patient is not wearing his CPAP.   - hydralazine 5 mg every 6 as needed SBP >180, DBP >100 - Continue home BiDil 20-37.5 3 times daily and amlodipine 10 mg to start 12/25 - Continue clonidine 0.1 mg daily - Obtain echocardiogram  as last echo was in 2016 - Will restart home labetalol at 200 mg twice daily, may need to increase to home dose of 400 mg twice daily - Continue holding home lisinopril in setting of AKI - Monitor BMP - Salt restricted diet - Tylenol as needed for headache  AKI on CKD: Progressing. Cr 2.7>2.8. Cr 2.28 on 12/11 with a baseline ~2.1.   AKI likely in the setting of hypertension as above, with CKD also likely due to uncontrolled chronic hypertension. - Control blood pressure as above - Trend BMP / urinary output - Replace electrolytes as indicated - Avoid nephrotoxic agents, ensure adequate renal perfusion - Obtain FENa if AKI does not resolve with blood pressure control  Previous CVA in 2014: Chronic, stable.  No residual deficits.  Reportedly lost his bilateral vision, however fortunately this spontaneously resolved.  PCP note states he is taking atorvastatin, however not on home medication list. - Continue home aspirin 325 mg - Follow-up on statin use, however patient unsure what he is taking but does state he takes all prescribed medications  Diabetes Mellitus, type 2: Chronic, well-controlled.  A1c 5.5 in 08/2018.  CBG 113. Takes metformin 500 mg daily at home.   - Hold home metformin while inpatient, may need to consider discontinuing if GFR remains <30 - Sensitive SSI - CBGs before meals and nightly     OSA, currently not on CPAP: Chronic. Pt reports he felt like he could not breathe with the CPAP.  Has not been using for quite some time.  May be contributing to hypertension. - Consider CPAP, or nasal cannula therapy  Concern for depression: Subacute.  Daughter voices concern that her father is depressed.  States he appears sad, has minimal sleep, and decreased appetite.  Affect appropriate on exam. - Ensure follow-up with PCP outpatient for depressive symptom/therapy  - Could consider PHQ 9  Concern for memory loss: Subacute.  Also per daughter, notes he is more forgetful and  confused about daily tasks.  Alert and oriented x3 on exam.  May be related to mental decline with age.  -Ensure follow-up with PCP outpatient for further work-up - Consider Moca  Hyperlipidemia: Chronic, stable. LDL 82 in 2018.  Reportedly on statin, will follow-up. -Follow-up statin use  Gout: Chronic, stable.  No current  flare.  Takes allopurinol. - Continue home allopurinol 100 mg daily - Monitor for symptoms  FEN/GI: Salt restricted diet, encourage fluid intake Prophylaxis: Lovenox  Disposition: Pending clinical improvement  Subjective:  Patient reports that he is compliant with all of his medications however when asked to name them and state how often he takes that he is unable to do so.  Patient reports that he has his bottles lined up in the cabinet and he will take them multiple times a day.  Wife reports that he takes his medications.  Appears the patient is not certain of the actual medications he should be on as he did take his hydralazine although he was told to stop this.  Objective: Temp:  [98.2 F (36.8 C)-98.6 F (37 C)] 98.2 F (36.8 C) (12/25 0427) Pulse Rate:  [37-78] 75 (12/25 1329) Resp:  [0-20] 18 (12/25 0427) BP: (141-219)/(83-121) 170/105 (12/25 1329) SpO2:  [93 %-99 %] 96 % (12/25 0427) Weight:  [105.8 kg] 105.8 kg (12/24 2046) Physical Exam: General: NAD, pleasant Eyes: PERRL, EOMI, no conjunctival pallor or injection ENTM: Moist mucous membranes Neck: Supple, no LAD Cardiovascular: RRR, no m/r/g, no LE edema Respiratory: CTA BL, normal work of breathing MSK: moves 4 extremities equally Derm: no rashes appreciated Neuro: CN II-XII grossly intact Psych: AOx3, appropriate affect  Laboratory: Recent Labs  Lab 12/14/18 1805 12/14/18 1814 12/15/18 0411  WBC 6.4  --  7.9  HGB 11.7* 11.9* 10.3*  HCT 36.6* 35.0* 31.6*  PLT 291  --  248   Recent Labs  Lab 12/14/18 1814 12/15/18 0411  NA 140 139  K 4.2 4.2  CL 110 107  CO2  --  22  BUN 29* 30*  CREATININE 2.70* 2.80*  CALCIUM  --  8.6*  GLUCOSE 113* 103*   TSH 0.958  Imaging/Diagnostic Tests: Ct Head Wo Contrast  Result Date: 12/14/2018 CLINICAL DATA:  Acute headache. EXAM: CT HEAD WITHOUT CONTRAST TECHNIQUE: Contiguous axial images were obtained from the base of the skull through the vertex without  intravenous contrast. COMPARISON:  MRI head 01/21/2013 FINDINGS: Brain: Mild atrophy. Negative for hydrocephalus. Negative for acute infarct, hemorrhage, mass Vascular: Negative for hyperdense vessel Skull: Negative Sinuses/Orbits: Negative Other: None IMPRESSION: Mild atrophy.  No acute abnormality. Electronically Signed   By: Franchot Gallo M.D.   On: 12/14/2018 18:44    Heavenlee Maiorana, [place], DO 12/15/2018, 1:41 PM PGY-2, [place] Intern pager: [phone], text pages welcome

## 2018-12-15 NOTE — Progress Notes (Signed)
  Echocardiogram 2D Echocardiogram has been performed.  Johny Chess 12/15/2018, 11:01 AM

## 2018-12-16 DIAGNOSIS — I161 Hypertensive emergency: Secondary | ICD-10-CM | POA: Diagnosis not present

## 2018-12-16 LAB — GLUCOSE, CAPILLARY
Glucose-Capillary: 106 mg/dL — ABNORMAL HIGH (ref 70–99)
Glucose-Capillary: 82 mg/dL (ref 70–99)

## 2018-12-16 LAB — CBC
HEMATOCRIT: 32.7 % — AB (ref 39.0–52.0)
Hemoglobin: 10.6 g/dL — ABNORMAL LOW (ref 13.0–17.0)
MCH: 28.9 pg (ref 26.0–34.0)
MCHC: 32.4 g/dL (ref 30.0–36.0)
MCV: 89.1 fL (ref 80.0–100.0)
Platelets: 246 10*3/uL (ref 150–400)
RBC: 3.67 MIL/uL — ABNORMAL LOW (ref 4.22–5.81)
RDW: 14.2 % (ref 11.5–15.5)
WBC: 7.1 10*3/uL (ref 4.0–10.5)
nRBC: 0 % (ref 0.0–0.2)

## 2018-12-16 LAB — BASIC METABOLIC PANEL
Anion gap: 7 (ref 5–15)
BUN: 25 mg/dL — AB (ref 8–23)
CO2: 24 mmol/L (ref 22–32)
Calcium: 8.8 mg/dL — ABNORMAL LOW (ref 8.9–10.3)
Chloride: 109 mmol/L (ref 98–111)
Creatinine, Ser: 2.59 mg/dL — ABNORMAL HIGH (ref 0.61–1.24)
GFR calc Af Amer: 28 mL/min — ABNORMAL LOW (ref >60)
GFR calc non Af Amer: 24 mL/min — ABNORMAL LOW (ref >60)
Glucose, Bld: 102 mg/dL — ABNORMAL HIGH (ref 70–99)
Potassium: 4.1 mmol/L (ref 3.5–5.1)
Sodium: 140 mmol/L (ref 135–145)

## 2018-12-16 LAB — HEMOGLOBIN A1C
Hgb A1c MFr Bld: 5.6 % (ref 4.8–5.6)
Mean Plasma Glucose: 114 mg/dL

## 2018-12-16 LAB — HIV ANTIBODY (ROUTINE TESTING W REFLEX): HIV Screen 4th Generation wRfx: NONREACTIVE

## 2018-12-16 MED ORDER — SIMVASTATIN 20 MG PO TABS: 20.0000 mg | ORAL_TABLET | Freq: Every day | ORAL | Status: DC

## 2018-12-16 MED ORDER — LISINOPRIL 10 MG PO TABS: 10.0000 mg | ORAL_TABLET | Freq: Every day | ORAL | Status: DC

## 2018-12-16 MED ORDER — LABETALOL HCL 200 MG PO TABS: 400.0000 mg | ORAL_TABLET | Freq: Two times a day (BID) | ORAL | 0 refills | Status: DC

## 2018-12-16 MED ORDER — SIMVASTATIN 20 MG PO TABS: 20.0000 mg | ORAL_TABLET | Freq: Every day | ORAL | 0 refills | Status: DC

## 2018-12-16 MED ORDER — LISINOPRIL 10 MG PO TABS: 10.0000 mg | ORAL_TABLET | Freq: Every day | ORAL | 0 refills | Status: DC

## 2018-12-16 NOTE — Care Management CC44 (Signed)
Condition Code 44 Documentation Completed  Patient Details  Name: Jared [name] MRN: [id number] Date of Birth: 1946/03/22   Condition Code 44 given:  Yes Patient signature on Condition Code 44 notice:  Yes Documentation of 2 MD's agreement:  Yes Code 44 added to claim:  Yes    Pollie Friar, RN 12/16/2018, 2:33 PM

## 2018-12-16 NOTE — Care Management Note (Signed)
Case Management Note  Patient Details  Name: Jared [name] MRN: [id number] Date of Birth: [birth year]  Subjective/Objective:                    Action/Plan: Pt discharged home with self care. Pt has PCP, insurance and transportation home.   Expected Discharge Date:  12/16/18               Expected Discharge Plan:  Home/Self Care  In-House Referral:     Discharge planning Services     Post Acute Care Choice:    Choice offered to:     DME Arranged:    DME Agency:     HH Arranged:    HH Agency:     Status of Service:  Completed, signed off  If discussed at [employer] of Stay Meetings, dates discussed:    Additional Comments:  Pollie Friar, RN 12/16/2018, 4:21 PM

## 2018-12-16 NOTE — Discharge Instructions (Addendum)
You were admitted with high blood pressure. Please take all your medication and place them in a pill box if able. Please continue your current medications including the new Bidil (three times per day), Amlodipine, Labetalol and lisinopril. It is very important that you follow up with your regular doctor, as soon as possible for him to check your kidney function and your blood pressures.   Please return to the ED if you develop a headache that will not resolve, chest pain, shortness of breath, you have vision changes or you make less urine.   Hypertension Hypertension is another name for high blood pressure. High blood pressure forces your heart to work harder to pump blood. This can cause problems over time. There are two numbers in a blood pressure reading. There is a top number (systolic) over a bottom number (diastolic). It is best to have a blood pressure below 120/80. Healthy choices can help lower your blood pressure. You may need medicine to help lower your blood pressure if:  Your blood pressure cannot be lowered with healthy choices.  Your blood pressure is higher than 130/80. Follow these instructions at home: Eating and drinking   If directed, follow the DASH eating plan. This diet includes: ? Filling half of your plate at each meal with fruits and vegetables. ? Filling one quarter of your plate at each meal with whole grains. Whole grains include whole wheat pasta, brown rice, and whole grain bread. ? Eating or drinking low-fat dairy products, such as skim milk or low-fat yogurt. ? Filling one quarter of your plate at each meal with low-fat (lean) proteins. Low-fat proteins include fish, skinless chicken, eggs, beans, and tofu. ? Avoiding fatty meat, cured and processed meat, or chicken with skin. ? Avoiding premade or processed food.  Eat less than 1,500 mg of salt (sodium) a day.  Limit alcohol use to no more than 1 drink a day for nonpregnant women and 2 drinks a day for men.  One drink equals 12 oz of beer, 5 oz of wine, or 1 oz of hard liquor. Lifestyle  Work with your doctor to stay at a healthy weight or to lose weight. Ask your doctor what the best weight is for you.  Get at least 30 minutes of exercise that causes your heart to beat faster (aerobic exercise) most days of the week. This may include walking, swimming, or biking.  Get at least 30 minutes of exercise that strengthens your muscles (resistance exercise) at least 3 days a week. This may include lifting weights or pilates.  Do not use any products that contain nicotine or tobacco. This includes cigarettes and e-cigarettes. If you need help quitting, ask your doctor.  Check your blood pressure at home as told by your doctor.  Keep all follow-up visits as told by your doctor. This is important. Medicines  Take over-the-counter and prescription medicines only as told by your doctor. Follow directions carefully.  Do not skip doses of blood pressure medicine. The medicine does not work as well if you skip doses. Skipping doses also puts you at risk for problems.  Ask your doctor about side effects or reactions to medicines that you should watch for. Contact a doctor if:  You think you are having a reaction to the medicine you are taking.  You have headaches that keep coming back (recurring).  You feel dizzy.  You have swelling in your ankles.  You have trouble with your vision. Get help right away if:  You  get a very bad headache.  You start to feel confused.  You feel weak or numb.  You feel faint.  You get very bad pain in your: ? Chest. ? Belly (abdomen).  You throw up (vomit) more than once.  You have trouble breathing. Summary  Hypertension is another name for high blood pressure.  Making healthy choices can help lower blood pressure. If your blood pressure cannot be controlled with healthy choices, you may need to take medicine. This information is not intended to  replace advice given to you by your health care provider. Make sure you discuss any questions you have with your health care provider. Document Released: 05/26/2008 Document Revised: 11/05/2016 Document Reviewed: 11/05/2016 Elsevier Interactive Patient Education  2019 [employer].

## 2018-12-16 NOTE — Care Management Obs Status (Signed)
[facility] NOTIFICATION   Patient Details  Name: KEMAR PANDIT MRN: [id number] Date of Birth: 05/19/1946   Medicare Observation Status Notification Given:  Yes    Pollie Friar, RN 12/16/2018, 2:33 PM

## 2018-12-16 NOTE — Progress Notes (Signed)
Family Medicine Teaching Service Daily Progress Note Intern Pager: [phone]  Patient name: Jared [name] record number: [id number] Date of birth: Jul 09, 1946 Age: 72 y.o. Gender: male  Primary Care Provider: Dorothyann Peng, NP Consultants: None Code Status: Full  Pt Overview and Major Events to Date:  Admitted 12/24  Assessment and Plan: Jared [name] is a 72 y.o. male presenting with a one-week history of progressively worsening headache associated with severely elevated blood pressures, found to be in hypertensive emergency. PMH is significant for chronic hypertension, previous CVA, type 2 diabetes, CKD, OSA not on CPAP, hyperlipidemia, and gout.  Hypertensive emergency with chronic hypertension, improved  Patient continued on home bottle and hydralazine.  Restarted home labetalol yesterday with improvement of blood pressures.  Last BP 143/77.  Patient symptoms overall improved.  Other vital signs are stable.  Worsening hypertension likely in the setting of advanced age but may also be related to noncompliance of CPAP and patient with history of OSA. - hydralazine 5 mg every 6 as needed SBP >180, DBP >100 - Continue home BiDil 20-37.5 TID, amlodipine 10mg  and labetalol 400 mg BID.  - Will discontinue clonidine 0.1 mg daily - Continue holding home lisinopril in setting of AKI - Salt restricted diet  AKI on CKD:  Improving. Cr 2.7>2.8>2.59 on 12/26. Cr 2.28 on 12/11 with a baseline ~2.1.  AKI likely in the setting of hypertension as above, with CKD also likely due to uncontrolled chronic hypertension. - Control blood pressure as above - Trend BMP / urinary output - Replace electrolytes as indicated - Avoid nephrotoxic agents, ensure adequate renal perfusion  Previous CVA in 2014: Chronic, stable.  No residual deficits.  Reportedly lost his bilateral vision, however fortunately this spontaneously resolved.  PCP note states he is taking, however not on home medication list. -  Continue home aspirin 325 mg - continue home simvastatin as found in last note from 08/05/2017, patient unsure of what he is on at home  Diabetes Mellitus, type 2: Chronic, well-controlled.  A1c 5.5 in 08/2018.  CBG 113. Takes metformin 500 mg daily at home.   - Hold home metformin while inpatient, may need to consider discontinuing if GFR remains <30 - Sensitive SSI - CBGs before meals and nightly     OSA, currently not on CPAP: Chronic. Pt reports he felt like he could not breathe with the CPAP.  Has not been using for quite some time.  May be contributing to hypertension. - Consider CPAP, or nasal cannula therapy  Concern for depression: Subacute.  Daughter voiced concern on admission that her father is depressed.  States he appears sad, has minimal sleep, and decreased appetite.  Affect appropriate on exam. - Ensure follow-up with PCP outpatient for depressive symptom/therapy  - Could consider PHQ 9  Concern for memory loss: Subacute.  Also per daughter, notes he is more forgetful and confused about daily tasks.  Alert and oriented x3 on exam.  May be related to mental decline with age.  - Ensure follow-up with PCP outpatient for further work-up - Consider [place]  Hyperlipidemia: Chronic, stable. LDL 82 in 2018.  Reportedly on statin, will follow-up. - Will restart simvastatin 20 (see problem previous CVA)  Gout: Chronic, stable.  No current flare.  Takes allopurinol. - Continue home allopurinol 100 mg daily - Monitor for symptoms  FEN/GI: Salt restricted diet, encourage fluid intake Prophylaxis: Lovenox  Disposition: Likely home today if patient able to ambulate well and blood pressure remains controlled on oral  medications  Subjective:  Patient reports that he is feeling much better today.  He reports that he will get a pillbox.  There is good to follow-up with their PCP on Monday if able.  Wife is calling to schedule an appointment.  Denies any episodes of chest pain or  shortness of breath.  Denies any further headaches or change in vision.  Objective: Temp:  [97.7 F (36.5 C)-98.8 F (37.1 C)] 98.3 F (36.8 C) (12/26 0906) Pulse Rate:  [73-81] 76 (12/26 0906) Resp:  [17-20] 18 (12/26 0906) BP: (138-185)/(76-108) 158/97 (12/26 0906) SpO2:  [97 %-100 %] 98 % (12/26 0906) Physical Exam: General: NAD, pleasant Eyes: PERRL, EOMI, no conjunctival pallor or injection ENTM: Moist mucous membranes Neck: Supple, no LAD Cardiovascular: RRR, no m/r/g, no LE edema Respiratory: CTA BL, normal work of breathing Derm: no rashes appreciated Neuro: CN II-XII grossly intact Psych: AOx3, appropriate affect  Laboratory: Recent Labs  Lab 12/14/18 1805 12/14/18 1814 12/15/18 0411 12/16/18 0524  WBC 6.4  --  7.9 7.1  HGB 11.7* 11.9* 10.3* 10.6*  HCT 36.6* 35.0* 31.6* 32.7*  PLT 291  --  248 246   Recent Labs  Lab 12/14/18 1814 12/15/18 0411 12/16/18 0524  NA 140 139 140  K 4.2 4.2 4.1  CL 110 107 109  CO2  --  22 24  BUN 29* 30* 25*  CREATININE 2.70* 2.80* 2.59*  CALCIUM  --  8.6* 8.8*  GLUCOSE 113* 103* 102*   TSH 0.958  Imaging/Diagnostic Tests: Ct Head Wo Contrast  Result Date: 12/14/2018 CLINICAL DATA:  Acute headache. EXAM: CT HEAD WITHOUT CONTRAST TECHNIQUE: Contiguous axial images were obtained from the base of the skull through the vertex without intravenous contrast. COMPARISON:  MRI head 01/21/2013 FINDINGS: Brain: Mild atrophy. Negative for hydrocephalus. Negative for acute infarct, hemorrhage, mass Vascular: Negative for hyperdense vessel Skull: Negative Sinuses/Orbits: Negative Other: None IMPRESSION: Mild atrophy.  No acute abnormality. Electronically Signed   By: Franchot Gallo M.D.   On: 12/14/2018 18:44    Seline Enzor, [place], DO 12/16/2018, 10:41 AM PGY-2, [place] Intern pager: [phone], text pages welcome

## 2018-12-16 NOTE — Progress Notes (Signed)
Patient was discharged and transported via wheelchair to [place] entrance.  Education was given regarding medications and explained until patient verbalized complete understanding. Caroll Rancher

## 2018-12-16 NOTE — Discharge Summary (Signed)
[facility] Discharge Summary  Patient name: Jared [name] record number: [id number] Date of birth: 24-Feb-1946 Age: 72 y.o. Gender: male Date of Admission: 12/14/2018  Date of Discharge: 12/16/2018 Admitting Physician: Kathrene Alu, MD  Primary Care Provider: Dorothyann Peng, NP Consultants: none  Indication for Hospitalization: Hypertensive emergency  Discharge Diagnoses/Problem List:  Hypertensive emergency in the setting of chronic hypertension AKI on CKD Previous CVA in 2014 Type 2 diabetes mellitus, well controlled OSA Concern for memory loss and depression  Disposition: home  Discharge Condition: stable  Discharge Exam:  General: NAD, pleasant Eyes: PERRL, EOMI, no conjunctival pallor or injection ENTM: Moist mucous membranes Neck: Supple, no LAD Cardiovascular: RRR, no m/r/g, no LE edema Respiratory: CTA BL, normal work of breathing Derm: no rashes appreciated Neuro: CN II-XII grossly intact Psych: AOx3, appropriate affect  Brief Hospital Course:  Wallice E [name] Is a 72 year old male who presented after 1 week of progressively worsening headaches and blurry vision.  Patient was seen by his PCP on 12/24 and started on BiDil due to elevated blood pressure.  Patient admitted to hospital given BP of 219/112 with vision changes and headache in the ED.  Only required labetalol IV 20 mg once and home medications before patient's blood pressure stabilized.  A repeat echocardiogram was obtained on 12/15/2018 and results can be seen below.  Patient was noted to have an AKI on admission.  This could have been due to to uncontrolled blood pressure but may also be related to worsening renal function.  Creatinine peaked at 2.8 and was beginning to improve prior to discharge with a discharge creatinine of 2.56.  Patient instructed to follow-up closely with his regular doctor in order to ensure that his creatinine continues to trend down.  Of note  patient is also on metformin for his diabetes and this was held during admission but may be restarted if GFR greater than 30 after discharge.  Lisinopril was decreased to 10 mg given renal function but may need to be increased to home dose of 40 mg for better blood pressure control if AKI has resolved.  Prior to discharge patient's blood pressure was 156/96 after restarting lisinopril at 10 mg.  However patient was able to ambulate without any symptoms of headache or vision changes.  Patient given strict return precautions and counseled on importance of following up with his regular PCP.  Issues for Follow Up:  1. Repeat BMP to trend creatinine after restarting lisinopril.  Patient sent home on lisinopril 10 mg but may need to go back on previous dose of 40 mg if kidney function allows. 2. Treat BP as necessary. May consider increasing Bidil in 2-3 weeks after starting. 3. Patient may be candidate for ambulatory 24 hour BP monitor.   4. Counseled patient on importance of using CPAP nightly for OSA in order to better control his hypertension. 5. F/u on possible memory issues 6. Some family voiced concern that patient may be suffering from depression, consider PHQ 9 screening  Significant Procedures: none  Significant Labs and Imaging:  Recent Labs  Lab 12/14/18 1805 12/14/18 1814 12/15/18 0411 12/16/18 0524  WBC 6.4  --  7.9 7.1  HGB 11.7* 11.9* 10.3* 10.6*  HCT 36.6* 35.0* 31.6* 32.7*  PLT 291  --  248 246   Recent Labs  Lab 12/14/18 1814 12/15/18 0411 12/16/18 0524  NA 140 139 140  K 4.2 4.2 4.1  CL 110 107 109  CO2  --  22 24  GLUCOSE 113* 103* 102*  BUN 29* 30* 25*  CREATININE 2.70* 2.80* 2.59*  CALCIUM  --  8.6* 8.8*   TSH is 0.958  ECHO 12/15/2018: Study Conclusions - Left ventricle: The cavity size was normal. Wall thickness was   increased in a pattern of mild LVH. Systolic function was normal.   The estimated ejection fraction was in the range of 60% to 65%.    Wall motion was normal; there were no regional wall motion   abnormalities. Features are consistent with a pseudonormal left   ventricular filling pattern, with concomitant abnormal relaxation   and increased filling pressure (grade 2 diastolic dysfunction). - Aortic valve: There was trivial regurgitation. - Mitral valve: There was mild regurgitation. - Left atrium: The atrium was moderately dilated. - Right atrium: The atrium was mildly dilated. - Pulmonary arteries: PA peak pressure: 46 mm Hg (S).  Results/Tests Pending at Time of Discharge: none  Discharge Medications:  Allergies as of 12/16/2018      Reactions   Strawberry Extract Shortness Of Breath      Medication List    STOP taking these medications   BC HEADACHE POWDER PO     TAKE these medications   allopurinol 100 MG tablet Commonly known as:  ZYLOPRIM TAKE 1 TABLET BY MOUTH EVERY DAY   amLODipine 10 MG tablet Commonly known as:  NORVASC TAKE 1 TABLET BY MOUTH EVERY DAY   aspirin EC 325 MG tablet Take 1 tablet (325 mg total) by mouth daily.   colchicine 0.6 MG tablet Take 0.6 mg by mouth daily as needed (as directed for gout flares).   isosorbide-hydrALAZINE 20-37.5 MG tablet Commonly known as:  BIDIL Take 1 tablet by mouth 3 (three) times daily.   labetalol 200 MG tablet Commonly known as:  NORMODYNE Take 2 tablets (400 mg total) by mouth 2 (two) times daily.   lisinopril 10 MG tablet Commonly known as:  PRINIVIL,ZESTRIL Take 1 tablet (10 mg total) by mouth daily. What changed:    medication strength  how much to take   metFORMIN 500 MG tablet Commonly known as:  GLUCOPHAGE TAKE 1 TABLET BY MOUTH TWICE A DAY WITH A MEAL.  **NEEDS APPOINTMENT WITH [facility]** What changed:    how much to take  how to take this  when to take this  additional instructions   pantoprazole 40 MG tablet Commonly known as:  PROTONIX Take 1 tablet (40 mg total) by mouth daily.   simvastatin 20 MG tablet Commonly  known as:  ZOCOR Take 1 tablet (20 mg total) by mouth daily at 6 PM.       Discharge Instructions: Please refer to Patient Instructions section of EMR for full details.  Patient was counseled important signs and symptoms that should prompt return to medical care, changes in medications, dietary instructions, activity restrictions, and follow up appointments.   Follow-Up Appointments: Follow-up Information    Dorothyann Peng, NP. Schedule an appointment as soon as possible for a visit in 1 week(s).   Specialty:  Family Medicine Contact information: [place] [address] [phone]           Stephaniemarie Stoffel, [place], DO 12/16/2018, 2:08 PM PGY-2, [place]

## 2018-12-17 ENCOUNTER — Telehealth: Payer: Self-pay

## 2018-12-17 NOTE — Telephone Encounter (Signed)
Attempted to contact patient to complete TCM and schedule hospital f/u. Voicemail not set up.

## 2018-12-21 ENCOUNTER — Other Ambulatory Visit: Payer: Medicare Other

## 2018-12-28 ENCOUNTER — Ambulatory Visit (INDEPENDENT_AMBULATORY_CARE_PROVIDER_SITE_OTHER): Payer: Medicare Other | Admitting: Adult Health

## 2018-12-28 ENCOUNTER — Encounter: Payer: Self-pay | Admitting: Adult Health

## 2018-12-28 VITALS — BP 140/80 | Temp 98.6°F | Wt 223.0 lb

## 2018-12-28 DIAGNOSIS — I10 Essential (primary) hypertension: Secondary | ICD-10-CM

## 2018-12-28 DIAGNOSIS — N179 Acute kidney failure, unspecified: Secondary | ICD-10-CM

## 2018-12-28 LAB — BASIC METABOLIC PANEL
BUN: 28 mg/dL — ABNORMAL HIGH (ref 6–23)
CO2: 25 mEq/L (ref 19–32)
Calcium: 9.6 mg/dL (ref 8.4–10.5)
Chloride: 106 mEq/L (ref 96–112)
Creatinine, Ser: 2.42 mg/dL — ABNORMAL HIGH (ref 0.40–1.50)
GFR: 34.24 mL/min — ABNORMAL LOW (ref >60.00)
Glucose, Bld: 95 mg/dL (ref 70–99)
Potassium: 4.8 mEq/L (ref 3.5–5.1)
Sodium: 140 mEq/L (ref 135–145)

## 2018-12-28 NOTE — Progress Notes (Signed)
Subjective:    Patient ID: Jared [name], male    DOB: [birth year], 73 y.o.   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   Part of this note was copied from an original document on Microsoft Word and may be transcribed as copied.  He presents to the office today for for TCM visit    Admit Date 12/14/2018  Discharge Date 12/16/2018   He presented to the ER with progressively worse HTN. He was seen in this office on the day of the ER visit and was started on BiDil due to elevated blood pressure readings. He was subsequently admitted to the hospital for BP of 219/112 with vision changes and headache in the ED. He required a single dose of labetalol IV 20 mg and home medication to stabilize patients blood pressure. He had a repeat echo done on 12/15/2018   ECHO 12/15/2018: Study Conclusions - Left ventricle: The cavity size was normal. Wall thickness was increased in a pattern of mild LVH. Systolic function was normal. The estimated ejection fraction was in the range of 60% to 65%. Wall motion was normal; there were no regional wall motion abnormalities. Features are consistent with a pseudonormal left ventricular filling pattern, with concomitant abnormal relaxation and increased filling pressure (grade 2 diastolic dysfunction). - Aortic valve: There was trivial regurgitation. - Mitral valve: There was mild regurgitation. -  Left atrium: The atrium was moderately dilated. - Right atrium: The atrium was mildly dilated. - Pulmonary arteries: PA peak pressure: 46 mm Hg (S).  Of note, patient was noted to have an AKI on admission. This could have been due to uncontrolled blood pressure but may have been related to worsening renal function. Cr. Peaked at 2.8 but was beginning to improve on discharge ( 2.56). Metformin was held on admission ( he is currently taking 500 mg once daily) and lisinopril was decreased to 10 mg ( he is currently taking 20 mg as he noted his blood pressure was going up at home).   Prior to discharge his BP was 156/96 after restarting lisinopril at 10 mg. He did not have any headaches or blurred vision prior to discharge.   Today in the office he reports that he has been monitoring his blood pressure at home and reports readings at home consistent with today's reading. He did have a single BP reading of 528 systolic. He denies any headaches or blurred vision. He has been taking his medication daily. He reports that he is feeling improved overall.   He has an upcoming appointment with Cardiology in two days and with nephrology in February.    Review of Systems  Constitutional: Negative.   Respiratory: Negative.   Cardiovascular: Negative.   Gastrointestinal: Negative.   Genitourinary: Negative.   Musculoskeletal: Negative.   Neurological: Negative.   Hematological: Negative.   Psychiatric/Behavioral: Negative.   All other systems reviewed and are negative.  Past Medical History:  Diagnosis Date  . Childhood asthma   . 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  . GERD (gastroesophageal reflux disease)   . Gout 07/26/2007   "on daily RX"  (12/15/2018)  .  HYPERCHOLESTEROLEMIA, BORDERLINE 01/16/2010  . HYPERTENSION 07/26/2007  . OSA (obstructive sleep apnea)    "haven't used mask in quite awhile" (12/15/2018)  . OSTEOARTHRITIS 09/28/2007  . PLANTAR FASCIITIS, BILATERAL 04/19/2010    . PROSTATITIS, RECURRENT 11/08/2009  . RENAL INSUFFICIENCY 07/26/2007  . Stroke ([place]) 01/21/2013   "fully recovered" (12/15/2018)  . Type II diabetes mellitus ([place])   . 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 URINARY FREQUENCY, CHRONIC 03/10/2008    Social History   Socioeconomic History  . Marital status: Married    Spouse name: [name]  . Number of children: 5  . Years of education: 64  . Highest education level: Not on file  Occupational History  . Occupation: TESTOR    Employer: [employer]: retired  Scientific laboratory technician  . Financial resource strain: Not on file  . Food insecurity:    Worry: Not on file    Inability: Not on file  . Transportation needs:    Medical: Not on file    Non-medical: Not on file  Tobacco Use  . Smoking status: Never Smoker  . Smokeless tobacco: Never Used  Substance and Sexual Activity  . Alcohol use: Not Currently  . Drug use: Never  . Sexual activity: Not Currently  Lifestyle  . Physical activity:    Days per week: Not on file    Minutes per session: Not on file  . Stress: Not on file  Relationships  . Social connections:    Talks on phone: Not on file    Gets together: Not on file    Attends religious service: Not on file    Active member of club or organization: Not on file    Attends meetings of clubs or organizations: Not on file    Relationship status: Not on file  . Intimate partner violence:    Fear of current or ex partner: Not on file    Emotionally abused: Not on file    Physically abused: Not on file    Forced sexual activity: Not on file  Other Topics Concern  . Not on file  Social History Narrative   He has a part time job for delivering car parts   Married    4 children - All in [place]      He likes to go to car shows and shoot pool.     Past Surgical History:  Procedure Laterality Date  . COLONOSCOPY    . CYST EXCISION Left    "leg"  . TONSILLECTOMY    . WRIST SURGERY Right    "cut bone out;  for gout""    Family History  Problem Relation Age of Onset  . Heart disease Mother   . Heart disease Father   . CVA Father   . Colon polyps Father   . Diabetes Sister   . Colon cancer Neg Hx   . Esophageal cancer Neg Hx   . Stomach cancer Neg Hx   . Pancreatic cancer Neg Hx   . Rectal cancer Neg Hx     Allergies  Allergen Reactions  . Strawberry Extract Shortness Of Breath    Current Outpatient Medications on File Prior to Visit  Medication Sig Dispense Refill  . allopurinol (ZYLOPRIM) 100 MG tablet Take 200 mg by mouth daily.    Marland Kitchen amLODipine (NORVASC) 10 MG tablet TAKE 1 TABLET BY MOUTH EVERY DAY (Patient taking differently: Take 10 mg by mouth daily. ) 90 tablet 1  . aspirin EC  325 MG tablet Take 1 tablet (325 mg total) by mouth daily.  0  . colchicine 0.6 MG tablet Take 0.6 mg by mouth daily as needed (as directed for gout flares).   11  . isosorbide-hydrALAZINE (BIDIL) 20-37.5 MG tablet Take 1 tablet by mouth 3 (three) times daily. 90 tablet 3  . labetalol (NORMODYNE) 200 MG tablet Take 2 tablets (400 mg total) by mouth 2 (two) times daily. 120 tablet 0  . metFORMIN (GLUCOPHAGE) 500 MG tablet TAKE 1 TABLET BY MOUTH TWICE A DAY WITH A MEAL.  **NEEDS APPOINTMENT WITH [facility]** (Patient taking differently: Take 500 mg by mouth daily with breakfast. ) 30 tablet 0  . pantoprazole (PROTONIX) 40 MG tablet Take 1 tablet (40 mg total) by mouth daily. 30 tablet 11  . simvastatin (ZOCOR) 20 MG tablet Take 1 tablet (20 mg total) by mouth daily at 6 PM. 30 tablet 0   No current facility-administered medications on file prior to visit.     BP 140/80   Temp 98.6 F (37 C)   Wt 223 lb (101.2 kg)   BMI 33.91 kg/m       Objective:   Physical Exam Vitals signs and nursing note reviewed.  Constitutional:      Appearance: Normal appearance. He is obese.  HENT:     Head: Normocephalic and atraumatic.     Right Ear: Tympanic membrane, ear canal and external ear normal. There is no  impacted cerumen.     Left Ear: Tympanic membrane, ear canal and external ear normal. There is no impacted cerumen.     Nose: Nose normal.     Mouth/Throat:     Mouth: Mucous membranes are moist.     Pharynx: Oropharynx is clear.  Eyes:     Extraocular Movements: Extraocular movements intact.     Pupils: Pupils are equal, round, and reactive to light.  Cardiovascular:     Rate and Rhythm: Normal rate and regular rhythm.     Pulses: Normal pulses.     Heart sounds: Normal heart sounds.  Pulmonary:     Effort: Pulmonary effort is normal.     Breath sounds: Normal breath sounds.  Musculoskeletal: Normal range of motion.  Skin:    General: Skin is warm and dry.     Capillary Refill: Capillary refill takes less than 2 seconds.  Neurological:     General: No focal deficit present.     Mental Status: He is alert and oriented to person, place, and time.  Psychiatric:        Mood and Affect: Mood normal.        Behavior: Behavior normal.        Thought Content: Thought content normal.        Judgment: Judgment normal.       Assessment & Plan:  1. AKI (acute kidney injury) ([place]) - Will recheck BMP. Consider increasing metformin and lisinopril based on labs  - Basic Metabolic Panel  2. Essential hypertension - Better controlled today  - We reviewed hospital notes, labs, and imaging. I answered all questions to the best of my ability. - Basic Metabolic Panel - Follow up with Cardiology and Nephrology as directed   Dorothyann Peng, NP

## 2018-12-29 ENCOUNTER — Other Ambulatory Visit: Payer: Self-pay | Admitting: Family Medicine

## 2018-12-29 MED ORDER — LISINOPRIL 10 MG PO TABS: 20.0000 mg | ORAL_TABLET | Freq: Every day | ORAL | 0 refills | Status: DC

## 2018-12-29 NOTE — Telephone Encounter (Signed)
Sent to the pharmacy by e-scribe. 

## 2018-12-30 ENCOUNTER — Ambulatory Visit: Payer: Medicare Other | Admitting: Cardiovascular Disease

## 2019-01-03 ENCOUNTER — Encounter: Payer: Self-pay | Admitting: Cardiovascular Disease

## 2019-01-09 ENCOUNTER — Other Ambulatory Visit (HOSPITAL_COMMUNITY): Payer: Self-pay | Admitting: Family Medicine

## 2019-01-17 DIAGNOSIS — N183 Chronic kidney disease, stage 3 (moderate): Secondary | ICD-10-CM | POA: Diagnosis not present

## 2019-02-03 ENCOUNTER — Encounter: Payer: Self-pay | Admitting: Family Medicine

## 2019-02-19 ENCOUNTER — Other Ambulatory Visit: Payer: Self-pay | Admitting: Adult Health

## 2019-02-22 ENCOUNTER — Ambulatory Visit (INDEPENDENT_AMBULATORY_CARE_PROVIDER_SITE_OTHER): Payer: Medicare Other | Admitting: Adult Health

## 2019-02-22 ENCOUNTER — Encounter: Payer: Self-pay | Admitting: Adult Health

## 2019-02-22 VITALS — BP 122/82 | Temp 98.1°F | Wt 221.0 lb

## 2019-02-22 DIAGNOSIS — E1121 Type 2 diabetes mellitus with diabetic nephropathy: Secondary | ICD-10-CM | POA: Diagnosis not present

## 2019-02-22 LAB — POCT GLYCOSYLATED HEMOGLOBIN (HGB A1C): HBA1C, POC (CONTROLLED DIABETIC RANGE): 5.8 % (ref 0.0–7.0)

## 2019-02-22 NOTE — Patient Instructions (Signed)
Please follow up in June for your physical exam

## 2019-02-22 NOTE — Telephone Encounter (Signed)
Sent to the pharmacy by e-scribe. 

## 2019-02-22 NOTE — Telephone Encounter (Signed)
Pt has appt today

## 2019-02-22 NOTE — Progress Notes (Signed)
Subjective:    Patient ID: Jared [name], male    DOB: 1946-12-17, 73 y.o.   MRN: [id number]  HPI  73 year old male who  has a past medical history of Childhood asthma, CHRONIC GOUTY ARTHROPATHY WITH TOPHUS (11/08/2009), GERD (gastroesophageal reflux disease), Gout (07/26/2007), HYPERCHOLESTEROLEMIA, BORDERLINE (01/16/2010), HYPERTENSION (07/26/2007), OSA (obstructive sleep apnea), OSTEOARTHRITIS (09/28/2007), PLANTAR FASCIITIS, BILATERAL (04/19/2010), PROSTATITIS, RECURRENT (11/08/2009), RENAL INSUFFICIENCY (07/26/2007), Stroke ([place]) (01/21/2013), Type II diabetes mellitus (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  He presents to the office today for follow up regarding DM type 2. He is currently maintained on Metformin 500 mg daily. He has not been eating any fast food. He continues to work part time and stays active. He denies any side effects of medication.   His last A1c was 5.6 in December   Wt Readings from Last 3 Encounters:  02/22/19 221 lb (100.2 kg)  12/28/18 223 lb (101.2 kg)  12/14/18 233 lb 4 oz (105.8 kg)     Review of Systems See HPI   Past Medical History:  Diagnosis Date  . Childhood asthma   . 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  . GERD (gastroesophageal reflux disease)   . Gout 07/26/2007   "on daily RX"  (12/15/2018)  . HYPERCHOLESTEROLEMIA, BORDERLINE 01/16/2010  . HYPERTENSION 07/26/2007  . OSA (obstructive sleep apnea)    "haven't used mask in quite awhile" (12/15/2018)  . OSTEOARTHRITIS 09/28/2007  . PLANTAR FASCIITIS, BILATERAL 04/19/2010  . PROSTATITIS, RECURRENT 11/08/2009  . RENAL INSUFFICIENCY 07/26/2007  . Stroke ([place]) 01/21/2013   "fully recovered" (12/15/2018)  . Type II diabetes mellitus ([place])   . 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 URINARY FREQUENCY, CHRONIC 03/10/2008    Social History   Socioeconomic History  . Marital status: Married    Spouse name:  [name]  . Number of children: 5  . Years of education: 45  . Highest education level: Not on file  Occupational History  . Occupation: TESTOR    Employer: [employer]: retired  Scientific laboratory technician  . Financial resource strain: Not on file  . Food insecurity:    Worry: Not on file    Inability: Not on file  . Transportation needs:    Medical: Not on file    Non-medical: Not on file  Tobacco Use  . Smoking status: Never Smoker  . Smokeless tobacco: Never Used  Substance and Sexual Activity  . Alcohol use: Not Currently  . Drug use: Never  . Sexual activity: Not Currently  Lifestyle  . Physical activity:    Days per week: Not on file    Minutes per session: Not on file  . Stress: Not on file  Relationships  . Social connections:    Talks on phone: Not on file    Gets together: Not on file    Attends religious service: Not on file    Active member of club or organization: Not on file    Attends meetings of clubs or organizations: Not on file    Relationship status: Not on file  . Intimate partner violence:    Fear of current or ex partner: Not on file    Emotionally abused: Not on file    Physically abused: Not on file    Forced sexual activity: Not on file  Other Topics Concern  . Not on file  Social History Narrative   He has a part time job for  delivering car parts   Married    4 children - All in [place]      He likes to go to car shows and shoot pool.     Past Surgical History:  Procedure Laterality Date  . COLONOSCOPY    . CYST EXCISION Left    "leg"  . TONSILLECTOMY    . WRIST SURGERY Right    "cut bone out; for gout""    Family History  Problem Relation Age of Onset  . Heart disease Mother   . Heart disease Father   . CVA Father   . Colon polyps Father   . Diabetes Sister   . Colon cancer Neg Hx   . Esophageal cancer Neg Hx   . Stomach cancer Neg Hx   . Pancreatic cancer Neg Hx   . Rectal cancer Neg Hx     Allergies  Allergen Reactions  .  Strawberry Extract Shortness Of Breath    Current Outpatient Medications on File Prior to Visit  Medication Sig Dispense Refill  . allopurinol (ZYLOPRIM) 100 MG tablet Take 100 mg by mouth 2 (two) times daily.     Marland Kitchen amLODipine (NORVASC) 10 MG tablet TAKE 1 TABLET BY MOUTH EVERY DAY (Patient taking differently: Take 10 mg by mouth daily. ) 90 tablet 1  . aspirin EC 325 MG tablet Take 1 tablet (325 mg total) by mouth daily.  0  . isosorbide-hydrALAZINE (BIDIL) 20-37.5 MG tablet Take 1 tablet by mouth 3 (three) times daily. 90 tablet 3  . labetalol (NORMODYNE) 200 MG tablet Take 2 tablets (400 mg total) by mouth 2 (two) times daily. 360 tablet 1  . lisinopril (PRINIVIL,ZESTRIL) 10 MG tablet TAKE 1 TABLET BY MOUTH EVERY DAY (Patient taking differently: Take 20 mg by mouth daily. ) 90 tablet 1  . pantoprazole (PROTONIX) 40 MG tablet Take 1 tablet (40 mg total) by mouth daily. 30 tablet 11  . simvastatin (ZOCOR) 20 MG tablet TAKE 1 TABLET (20 MG TOTAL) BY MOUTH DAILY AT 6 PM. 90 tablet 1  . metFORMIN (GLUCOPHAGE) 500 MG tablet TAKE 1 TABLET BY MOUTH TWICE A DAY WITH A MEAL. 180 tablet 1   No current facility-administered medications on file prior to visit.     BP 122/82   Temp 98.1 F (36.7 C)   Wt 221 lb (100.2 kg)   BMI 33.60 kg/m       Objective:   Physical Exam Vitals signs and nursing note reviewed.  Constitutional:      Appearance: Normal appearance.  Eyes:     Extraocular Movements: Extraocular movements intact.  Cardiovascular:     Rate and Rhythm: Normal rate and regular rhythm.     Pulses: Normal pulses.     Heart sounds: Normal heart sounds.  Pulmonary:     Effort: Pulmonary effort is normal.     Breath sounds: Normal breath sounds.  Abdominal:     General: Abdomen is flat.     Palpations: Abdomen is soft.  Skin:    General: Skin is warm and dry.     Capillary Refill: Capillary refill takes less than 2 seconds.  Neurological:     General: No focal deficit present.      Mental Status: He is alert and oriented to person, place, and time. Mental status is at baseline.  Psychiatric:        Mood and Affect: Mood normal.        Behavior: Behavior normal.  Thought Content: Thought content normal.        Judgment: Judgment normal.       Assessment & Plan:  1. Type 2 diabetes mellitus with diabetic nephropathy, without long-term current use of insulin (HCC)  - POCT A1C- 5.8  - Continue with Metformin 500 mg daily  - Follow up in three months for CPE or sooner if needed  [employer]

## 2019-03-08 ENCOUNTER — Telehealth: Payer: Self-pay | Admitting: Adult Health

## 2019-03-08 NOTE — Telephone Encounter (Signed)
Copied from [place] [id number]. Topic: Quick Communication - Rx Refill/Question >> Mar 08, 2019 11:51 AM Alanda Slim E wrote: Medication: amLODipine (NORVASC) 10 MG tablet isosorbide-hydrALAZINE (BIDIL) 20-37.5 MG tablet  labetalol (NORMODYNE) 200 MG tablet lisinopril (PRINIVIL,ZESTRIL) 10 MG tablet metFORMIN (GLUCOPHAGE) 500 MG tablet simvastatin (ZOCOR) 20 MG tablet  pantoprazole (PROTONIX) 40 MG tablet Pt is not out of meds but wanted to know if meds can be called in and readily available incase of closing of stores or situation with the corona virus/ please advise Pt   Has the patient contacted their pharmacy? No   Preferred Pharmacy (with phone number or street name): [id number] - [place], [place] [phone] (Phone) [phone] (Fax)    Agent: Please be advised that RX refills may take up to 3 business days. We ask that you follow-up with your pharmacy.

## 2019-03-08 NOTE — Telephone Encounter (Signed)
pharmacys will not be closing. It looks like he has refills on his medications currently. No need to send in additional refills at this time

## 2019-03-09 NOTE — Telephone Encounter (Signed)
Spoke to the pt and informed him that he has refills on file.  He can use the refills but may have to pay out of pocket.  Nothing further needed.

## 2019-03-12 ENCOUNTER — Other Ambulatory Visit: Payer: Self-pay | Admitting: Adult Health

## 2019-03-12 DIAGNOSIS — I10 Essential (primary) hypertension: Secondary | ICD-10-CM

## 2019-03-21 ENCOUNTER — Other Ambulatory Visit: Payer: Self-pay | Admitting: Adult Health

## 2019-03-21 NOTE — Telephone Encounter (Signed)
Filled in Jan for 6 months.

## 2019-03-29 ENCOUNTER — Other Ambulatory Visit: Payer: Self-pay | Admitting: Adult Health

## 2019-03-29 NOTE — Telephone Encounter (Signed)
Sent to the pharmacy by e-scribe. 

## 2019-04-02 ENCOUNTER — Other Ambulatory Visit: Payer: Self-pay | Admitting: Internal Medicine

## 2019-04-02 DIAGNOSIS — I10 Essential (primary) hypertension: Secondary | ICD-10-CM

## 2019-04-04 NOTE — Telephone Encounter (Signed)
Sent to the pharmacy by e-scribe for 1 year Dec 2019.  Message sent to the pharmacy to check file.  Nothing further needed.

## 2019-04-18 ENCOUNTER — Other Ambulatory Visit: Payer: Self-pay | Admitting: *Deleted

## 2019-04-18 MED ORDER — PANTOPRAZOLE SODIUM 40 MG PO TBEC: 40.0000 mg | DELAYED_RELEASE_TABLET | Freq: Every day | ORAL | 2 refills | Status: DC

## 2019-05-15 ENCOUNTER — Other Ambulatory Visit: Payer: Self-pay | Admitting: Internal Medicine

## 2019-05-15 DIAGNOSIS — I10 Essential (primary) hypertension: Secondary | ICD-10-CM

## 2019-05-17 NOTE — Telephone Encounter (Signed)
Ok to refill for 90+3 

## 2019-05-18 NOTE — Telephone Encounter (Signed)
Sent to the pharmacy by e-scribe. 

## 2019-05-20 ENCOUNTER — Telehealth: Payer: Self-pay | Admitting: Adult Health

## 2019-05-20 NOTE — Telephone Encounter (Signed)
Copied from [place] [id number]. Topic: Quick Communication - See Telephone Encounter >> May 20, 2019  8:33 AM Reyne Dumas L wrote: CRM for notification. See Telephone encounter for: 05/20/19.  Pt called and states that he needs a note for work that states that he is at high risk for COVID-19. Pt can be reached at [phone]

## 2019-05-20 NOTE — Telephone Encounter (Signed)
Pt needs virtual visit to discuss.

## 2019-05-25 ENCOUNTER — Encounter: Payer: Self-pay | Admitting: Adult Health

## 2019-05-25 ENCOUNTER — Ambulatory Visit (INDEPENDENT_AMBULATORY_CARE_PROVIDER_SITE_OTHER): Payer: Medicare Other | Admitting: Adult Health

## 2019-05-25 ENCOUNTER — Other Ambulatory Visit: Payer: Self-pay

## 2019-05-25 DIAGNOSIS — Z0289 Encounter for other administrative examinations: Secondary | ICD-10-CM | POA: Diagnosis not present

## 2019-05-25 NOTE — Progress Notes (Signed)
Virtual Visit via Telephone Note  I connected with Jared [name] on 05/25/19 at  9:00 AM EDT by telephone and verified that I am speaking with the correct person using two identifiers.   I discussed the limitations, risks, security and privacy concerns of performing an evaluation and management service by telephone and the availability of in person appointments. I also discussed with the patient that there may be a patient responsible charge related to this service. The patient expressed understanding and agreed to proceed.  Location patient: home Location provider: work or home office Participants present for the call: patient, provider Patient did not have a visit in the prior 7 days to address this/these issue(s).   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  He works part time delivering auto parts and would like a note for work excusing him for work due to his medical history placing him in the high risk category for Covid 19.    Observations/Objective: Patient sounds cheerful and well on the phone. I do not appreciate any SOB. Speech and thought processing are grossly intact. Patient reported vitals:  Assessment and Plan: Note printed and placed at front desk for pick up   Follow Up Instructions:  I did not refer this patient for an OV in the next 24 hours for this/these issue(s).  I discussed the assessment and treatment  plan with the patient. The patient was provided an opportunity to ask questions and all were answered. The patient agreed with the plan and demonstrated an understanding of the instructions.   The patient was advised to call back or seek an in-person evaluation if the symptoms worsen or if the condition fails to improve as anticipated.  I provided 5 minutes of non-face-to-face time during this encounter.   Dorothyann Peng, NP

## 2019-05-31 ENCOUNTER — Encounter: Payer: Medicare Other | Admitting: Adult Health

## 2019-06-02 ENCOUNTER — Ambulatory Visit: Payer: Self-pay

## 2019-06-02 NOTE — Telephone Encounter (Signed)
Patient called and says he needs to talk with Tommi Rumps about what is going on with him. He says he feels like he's depressed. He says he has to make himself get up to do things, sleeps a lot, feelings of worthlessness, feelings of sadness, decreased energy. He says he can be watching television, then all of a sudden he's crying, not just tears flowing down his face, but crying for no reason. He says something is going on. He says he's not thinking of killing himself. He says he talks to his wife about it and she just brushes it off. He denies any other symptoms just says Tommi Rumps will know what to tell him. I advised the office is closed and someone will call him tomorrow to schedule a virtual visit, he verbalized understanding.  Answer Assessment - Initial Assessment Questions 1. CONCERN: "What happened that made you call today?"     Feel like I need to talk to [facility] today 2. DEPRESSION SYMPTOM SCREENING: "How are you feeling overall?" (e.g., decreased energy, increased sleeping or difficulty sleeping, difficulty concentrating, feelings of sadness, guilt, hopelessness, or worthlessness)     Decreased energy, increased sleeping, feelings of sadness, worthlessness 3. RISK OF HARM - SUICIDAL IDEATION:  "Do you ever have thoughts of hurting or killing yourself?"  (e.g., yes, no, no but preoccupation with thoughts about death)   - INTENT:  "Do you have thoughts of hurting or killing yourself right NOW?" (e.g., yes, no, N/A)   - PLAN: "Do you have a specific plan for how you would do this?" (e.g., gun, knife, overdose, no plan, N/A)     No suicidal ideations 4. RISK OF HARM - HOMICIDAL IDEATION:  "Do you ever have thoughts of hurting or killing someone else?"  (e.g., yes, no, no but preoccupation with thoughts about death)   - INTENT:  "Do you have thoughts of hurting or killing someone right NOW?" (e.g., yes, no, N/A)   - PLAN: "Do you have a specific plan for how you would do this?" (e.g., gun, knife, no plan, N/A)       No plan 5. FUNCTIONAL IMPAIRMENT: "How have things been going for you overall in your life? Have you had any more difficulties than usual doing your normal daily activities?"  (e.g., better, same, worse; self-care, school, work, interactions)     Make myself get up to do things 6. SUPPORT: "Who is with you now?" "Who do you live with?" "Do you have family or friends nearby who you can talk to?"      I don't talk to anyone like that; can talk to my wife and my sister 22. THERAPIST: "Do you have a counselor or therapist? Name?"     Never seen anyone 8. STRESSORS: "Has there been any new stress or recent changes in your life?"     No 9. DRUG ABUSE/ALCOHOL: "Do you drink alcohol or use any illegal drugs?"      No 10. OTHER: "Do you have any other health or medical symptoms right now?" (e.g., fever)       No 11. PREGNANCY: "Is there any chance you are pregnant?" "When was your last menstrual period?"      N/A  Protocols used: DEPRESSION-A-AH

## 2019-06-03 NOTE — Telephone Encounter (Signed)
Tried reaching the pt.  Received a message that the voicemail box has not been set up.  Will try again at a later time.

## 2019-06-07 NOTE — Telephone Encounter (Signed)
Called and spoke to the pt.  Offered appointment to see [facility].  Pt stated he no longer wants an appointment and said, "Ill be alright."  Will call back if needed.

## 2019-06-30 ENCOUNTER — Ambulatory Visit (INDEPENDENT_AMBULATORY_CARE_PROVIDER_SITE_OTHER): Payer: Medicare Other | Admitting: Adult Health

## 2019-06-30 ENCOUNTER — Other Ambulatory Visit: Payer: Self-pay

## 2019-06-30 ENCOUNTER — Encounter: Payer: Self-pay | Admitting: Adult Health

## 2019-06-30 VITALS — BP 150/90 | HR 67 | Wt 224.6 lb

## 2019-06-30 DIAGNOSIS — K219 Gastro-esophageal reflux disease without esophagitis: Secondary | ICD-10-CM

## 2019-06-30 DIAGNOSIS — E782 Mixed hyperlipidemia: Secondary | ICD-10-CM

## 2019-06-30 DIAGNOSIS — E1121 Type 2 diabetes mellitus with diabetic nephropathy: Secondary | ICD-10-CM

## 2019-06-30 DIAGNOSIS — I10 Essential (primary) hypertension: Secondary | ICD-10-CM | POA: Diagnosis not present

## 2019-06-30 DIAGNOSIS — Z0000 Encounter for general adult medical examination without abnormal findings: Secondary | ICD-10-CM

## 2019-06-30 DIAGNOSIS — M1A00X1 Idiopathic chronic gout, unspecified site, with tophus (tophi): Secondary | ICD-10-CM | POA: Diagnosis not present

## 2019-06-30 DIAGNOSIS — Z125 Encounter for screening for malignant neoplasm of prostate: Secondary | ICD-10-CM | POA: Diagnosis not present

## 2019-06-30 LAB — LIPID PANEL
Cholesterol: 120 mg/dL (ref 0–200)
HDL: 32.6 mg/dL — ABNORMAL LOW
LDL Cholesterol: 61 mg/dL (ref 0–99)
NonHDL: 87.16
Total CHOL/HDL Ratio: 4
Triglycerides: 131 mg/dL (ref 0.0–149.0)
VLDL: 26.2 mg/dL (ref 0.0–40.0)

## 2019-06-30 LAB — CBC WITH DIFFERENTIAL/PLATELET
Basophils Absolute: 0.1 10*3/uL (ref 0.0–0.1)
Basophils Relative: 0.9 % (ref 0.0–3.0)
Eosinophils Absolute: 0.3 10*3/uL (ref 0.0–0.7)
Eosinophils Relative: 5.2 % — ABNORMAL HIGH (ref 0.0–5.0)
HCT: 37.1 % — ABNORMAL LOW (ref 39.0–52.0)
Hemoglobin: 12.4 g/dL — ABNORMAL LOW (ref 13.0–17.0)
Lymphocytes Relative: 19.4 % (ref 12.0–46.0)
Lymphs Abs: 1.3 10*3/uL (ref 0.7–4.0)
MCHC: 33.5 g/dL (ref 30.0–36.0)
MCV: 90.5 fl (ref 78.0–100.0)
Monocytes Absolute: 0.7 10*3/uL (ref 0.1–1.0)
Monocytes Relative: 10.3 % (ref 3.0–12.0)
Neutro Abs: 4.2 10*3/uL (ref 1.4–7.7)
Neutrophils Relative %: 64.2 % (ref 43.0–77.0)
Platelets: 243 10*3/uL (ref 150.0–400.0)
RBC: 4.1 Mil/uL — ABNORMAL LOW (ref 4.22–5.81)
RDW: 15.4 % (ref 11.5–15.5)
WBC: 6.5 10*3/uL (ref 4.0–10.5)

## 2019-06-30 LAB — COMPREHENSIVE METABOLIC PANEL
ALT: 13 U/L (ref 0–53)
AST: 20 U/L (ref 0–37)
Albumin: 4.6 g/dL (ref 3.5–5.2)
Alkaline Phosphatase: 95 U/L (ref 39–117)
BUN: 23 mg/dL (ref 6–23)
CO2: 26 mEq/L (ref 19–32)
Calcium: 9.3 mg/dL (ref 8.4–10.5)
Chloride: 105 mEq/L (ref 96–112)
Creatinine, Ser: 2.17 mg/dL — ABNORMAL HIGH (ref 0.40–1.50)
GFR: 36.49 mL/min — ABNORMAL LOW (ref >60.00)
Glucose, Bld: 107 mg/dL — ABNORMAL HIGH (ref 70–99)
Potassium: 4.7 mEq/L (ref 3.5–5.1)
Sodium: 139 mEq/L (ref 135–145)
Total Bilirubin: 0.5 mg/dL (ref 0.2–1.2)
Total Protein: 7.6 g/dL (ref 6.0–8.3)

## 2019-06-30 LAB — PSA: PSA: 1.07 ng/mL (ref 0.10–4.00)

## 2019-06-30 LAB — HEMOGLOBIN A1C: Hgb A1c MFr Bld: 6.2 % (ref 4.6–6.5)

## 2019-06-30 LAB — TSH: TSH: 0.91 u[IU]/mL (ref 0.35–4.50)

## 2019-06-30 MED ORDER — LISINOPRIL 10 MG PO TABS: 20.0000 mg | ORAL_TABLET | Freq: Every day | ORAL | 3 refills | Status: DC

## 2019-06-30 MED ORDER — LABETALOL HCL 200 MG PO TABS: 400.0000 mg | ORAL_TABLET | Freq: Two times a day (BID) | ORAL | 3 refills | Status: DC

## 2019-06-30 NOTE — Progress Notes (Signed)
Subjective:    Patient ID: Jared [name], male    DOB: [birth year], 73 y.o.   MRN: [id number]  HPI  Patient presents for yearly preventative medicine examination. He is a pleasant 73 year old male who  has a past medical history of Childhood asthma, CHRONIC GOUTY ARTHROPATHY WITH TOPHUS (11/08/2009), GERD (gastroesophageal reflux disease), Gout (07/26/2007), HYPERCHOLESTEROLEMIA, BORDERLINE (01/16/2010), HYPERTENSION (07/26/2007), OSA (obstructive sleep apnea), OSTEOARTHRITIS (09/28/2007), PLANTAR FASCIITIS, BILATERAL (04/19/2010), PROSTATITIS, RECURRENT (11/08/2009), RENAL INSUFFICIENCY (07/26/2007), Stroke ([place]) (01/21/2013), Type II diabetes mellitus ([place]), Unspecified disorder of lipoid metabolism (11/08/2009), and URINARY FREQUENCY, CHRONIC (03/10/2008).   DM- Currently prescribed Metformin 500 mg BID. He does not check his blood sugars at home. He stays active but does not formally exercise. He has been trying to eat healthy.  Lab Results  Component Value Date   HGBA1C 5.8 02/22/2019   Essential Hypertension -prescribed Norvasc 10 mg daily, BiDil 20-37.5 mg 3 times daily, hydralazine 25 mg 3 times daily, labetalol 400 mg twice daily, and lisinopril 10 mg twice daily. He did not take his medications this morning. Denies chest pain, SOB, dizziness, or lightheadedness BP Readings from Last 3 Encounters:  06/30/19 (!) 150/90  02/22/19 122/82  12/28/18 140/80   CKD III-is followed by nephrology at [facility] every 6 months.  Was last seen in February 2020. Kidney function has been stable.   Hyperlipidemia - Takes Simvastatin 20 mg and ASA 325 mg  Lab Results  Component Value Date   CHOL 153 08/05/2017   HDL 31.50 (L) 08/05/2017   LDLCALC 82 08/05/2017   LDLDIRECT 85.1 06/03/2013   TRIG 199.0 (H) 08/05/2017   CHOLHDL 5 08/05/2017   H/o Chronic Gout - takes Allopurinol 200 mg daily. Has not had any recent gout flares.   GERD - controlled with Protonix 40 mg   All immunizations and  health maintenance protocols were reviewed with the patient and needed orders were placed.  Appropriate screening laboratory values were ordered for the patient including screening of hyperlipidemia, renal function and hepatic function. If indicated by BPH, a PSA was ordered.  Medication reconciliation,  past medical history, social history, problem list and allergies were reviewed in detail with the patient  Goals were established with regard to weight loss, exercise, and  diet in compliance with medications  Wt Readings from Last 3 Encounters:  06/30/19 224 lb 9.6 oz (101.9 kg)  02/22/19 221 lb (100.2 kg)  12/28/18 223 lb (101.2 kg)   He is up to date on his routine screening colonoscopy. He has not had his eye exam this year  Review of Systems  Constitutional: Negative.   HENT: Negative.   Eyes: Negative.   Respiratory: Negative.   Cardiovascular: Negative.   Gastrointestinal: Negative.   Endocrine: Negative.   Genitourinary: Negative.   Musculoskeletal: Negative.   Skin: Negative.   Allergic/Immunologic: Negative.   Neurological: Negative.   Hematological: Negative.   Psychiatric/Behavioral: Negative.   All other systems reviewed and are negative.  Past Medical History:  Diagnosis Date  . Childhood asthma   . 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  . GERD (gastroesophageal reflux disease)   . Gout 07/26/2007   "on daily RX"  (12/15/2018)  . HYPERCHOLESTEROLEMIA, BORDERLINE 01/16/2010  . HYPERTENSION 07/26/2007  . OSA (obstructive sleep apnea)    "haven't used mask in quite awhile" (12/15/2018)  . OSTEOARTHRITIS 09/28/2007  . PLANTAR FASCIITIS, BILATERAL 04/19/2010  . PROSTATITIS, RECURRENT 11/08/2009  . RENAL INSUFFICIENCY 07/26/2007  . Stroke ([place]) 01/21/2013   "  fully recovered" (12/15/2018)  . Type II diabetes mellitus ([place])   . 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 URINARY FREQUENCY, CHRONIC 03/10/2008    Social History    Socioeconomic History  . Marital status: Married    Spouse name: Jared [name]  . Number of children: 5  . Years of education: 59  . Highest education level: Not on file  Occupational History  . Occupation: TESTOR    Employer: [employer]: retired  Scientific laboratory technician  . Financial resource strain: Not on file  . Food insecurity    Worry: Not on file    Inability: Not on file  . Transportation needs    Medical: Not on file    Non-medical: Not on file  Tobacco Use  . Smoking status: Never Smoker  . Smokeless tobacco: Never Used  Substance and Sexual Activity  . Alcohol use: Not Currently  . Drug use: Never  . Sexual activity: Not Currently  Lifestyle  . Physical activity    Days per week: Not on file    Minutes per session: Not on file  . Stress: Not on file  Relationships  . Social Herbalist on phone: Not on file    Gets together: Not on file    Attends religious service: Not on file    Active member of club or organization: Not on file    Attends meetings of clubs or organizations: Not on file    Relationship status: Not on file  . Intimate partner violence    Fear of current or ex partner: Not on file    Emotionally abused: Not on file    Physically abused: Not on file    Forced sexual activity: Not on file  Other Topics Concern  . Not on file  Social History Narrative   He has a part time job for delivering car parts   Married    4 children - All in [place]      He likes to go to car shows and shoot pool.     Past Surgical History:  Procedure Laterality Date  . COLONOSCOPY    . CYST EXCISION Left    "leg"  . TONSILLECTOMY    . WRIST SURGERY Right    "cut bone out; for gout""    Family History  Problem Relation Age of Onset  . Heart disease Mother   . Heart disease Father   . CVA Father   . Colon polyps Father   . Diabetes Sister   . Colon cancer Neg Hx   . Esophageal cancer Neg Hx   . Stomach cancer Neg Hx   . Pancreatic cancer Neg Hx   .  Rectal cancer Neg Hx     Allergies  Allergen Reactions  . Strawberry Extract Shortness Of Breath    Current Outpatient Medications on File Prior to Visit  Medication Sig Dispense Refill  . allopurinol (ZYLOPRIM) 100 MG tablet Take 100 mg by mouth 2 (two) times daily.     Marland Kitchen amLODipine (NORVASC) 10 MG tablet TAKE 1 TABLET BY MOUTH EVERY DAY (Patient taking differently: Take 10 mg by mouth daily. ) 90 tablet 1  . aspirin EC 325 MG tablet Take 1 tablet (325 mg total) by mouth daily.  0  . BIDIL 20-37.5 MG tablet TAKE 1 TABLET BY MOUTH THREE TIMES A DAY 90 tablet 3  . hydrALAZINE (APRESOLINE) 25 MG tablet TAKE 1 TABLET BY MOUTH THREE TIMES A  DAY 90 tablet 1  . labetalol (NORMODYNE) 200 MG tablet Take 2 tablets (400 mg total) by mouth 2 (two) times daily. 360 tablet 1  . lisinopril (PRINIVIL,ZESTRIL) 10 MG tablet TAKE 2 TABLETS BY MOUTH EVERY DAY 180 tablet 0  . metFORMIN (GLUCOPHAGE) 500 MG tablet TAKE 1 TABLET BY MOUTH TWICE A DAY WITH A MEAL. 180 tablet 1  . pantoprazole (PROTONIX) 40 MG tablet Take 1 tablet (40 mg total) by mouth daily. 30 tablet 2  . simvastatin (ZOCOR) 20 MG tablet TAKE 1 TABLET (20 MG TOTAL) BY MOUTH DAILY AT 6 PM. 90 tablet 1   No current facility-administered medications on file prior to visit.     BP (!) 150/90 (BP Location: Left Arm, Patient Position: Sitting, Cuff Size: Normal)   Pulse 67   Wt 224 lb 9.6 oz (101.9 kg)   SpO2 97%   BMI 34.15 kg/m       Objective:   Physical Exam Vitals signs and nursing note reviewed.  Constitutional:      General: He is not in acute distress.    Appearance: He is obese. He is not diaphoretic.  HENT:     Head: Normocephalic and atraumatic.     Right Ear: External ear normal.     Left Ear: External ear normal.     Nose: Nose normal.     Mouth/Throat:     Mouth: Mucous membranes are moist.     Pharynx: Oropharynx is clear. No oropharyngeal exudate or posterior oropharyngeal erythema.  Eyes:     General: No scleral  icterus.       Right eye: No discharge.        Left eye: No discharge.     Conjunctiva/sclera: Conjunctivae normal.     Pupils: Pupils are equal, round, and reactive to light.  Neck:     Musculoskeletal: Normal range of motion and neck supple.     Thyroid: No thyromegaly.     Vascular: No JVD.     Trachea: No tracheal deviation.  Cardiovascular:     Rate and Rhythm: Normal rate and regular rhythm.     Pulses: Normal pulses.     Heart sounds: Normal heart sounds. No murmur. No friction rub. No gallop.   Pulmonary:     Effort: Pulmonary effort is normal. No respiratory distress.     Breath sounds: Normal breath sounds. No stridor. No wheezing or rales.  Chest:     Chest wall: No tenderness.  Abdominal:     General: Abdomen is flat. Bowel sounds are normal. There is no distension.     Palpations: Abdomen is soft. There is no mass.     Tenderness: There is no abdominal tenderness. There is no guarding or rebound.  Musculoskeletal: Normal range of motion.        General: No swelling, tenderness, deformity or signs of injury.     Right lower leg: No edema.     Left lower leg: No edema.  Lymphadenopathy:     Cervical: No cervical adenopathy.  Skin:    General: Skin is warm and dry.     Capillary Refill: Capillary refill takes less than 2 seconds.     Coloration: Skin is not jaundiced or pale.     Findings: No bruising, erythema, lesion or rash.  Neurological:     General: No focal deficit present.     Mental Status: He is alert and oriented to person, place, and time.     Cranial  Nerves: No cranial nerve deficit.     Sensory: No sensory deficit.     Motor: No weakness or abnormal muscle tone.     Coordination: Coordination normal.     Gait: Gait normal.     Deep Tendon Reflexes: Reflexes are normal and symmetric. Reflexes normal.  Psychiatric:        Mood and Affect: Mood normal.        Behavior: Behavior normal.        Thought Content: Thought content normal.        Judgment:  Judgment normal.       Assessment & Plan:  1. Routine general medical examination at a health care facility  - CBC with Differential/Platelet - Comprehensive metabolic panel - Hemoglobin A1c - Lipid panel - TSH  2. Essential hypertension - BP controlled in the past. Did not take his medication this morning due to fasting.  - CBC with Differential/Platelet - Comprehensive metabolic panel - Hemoglobin A1c - Lipid panel - TSH - labetalol (NORMODYNE) 200 MG tablet; Take 2 tablets (400 mg total) by mouth 2 (two) times daily.  Dispense: 360 tablet; Refill: 3 - lisinopril (ZESTRIL) 10 MG tablet; Take 2 tablets (20 mg total) by mouth daily.  Dispense: 180 tablet; Refill: 3  3. CHRONIC GOUTY ARTHROPATHY WITH TOPHUS - Continue with Allopurinol   4. Mixed hyperlipidemia - Consider increase in statin  - CBC with Differential/Platelet - Comprehensive metabolic panel - Hemoglobin A1c - Lipid panel - TSH  5. Type 2 diabetes mellitus with diabetic nephropathy, without long-term current use of insulin (HCC) - Consider increase in medication  - Likely 6 month follow up - Follow up for diabetic eye exam.  - Encouraged weight loss through diet and exercise  - CBC with Differential/Platelet - Comprehensive metabolic panel - Hemoglobin A1c - Lipid panel - TSH  6. Gastroesophageal reflux disease without esophagitis - Continue with Protonix   7. Prostate cancer screening  - PSA  Dorothyann Peng, NP

## 2019-07-15 ENCOUNTER — Other Ambulatory Visit: Payer: Self-pay

## 2019-07-15 MED ORDER — PANTOPRAZOLE SODIUM 40 MG PO TBEC: 40.0000 mg | DELAYED_RELEASE_TABLET | Freq: Every day | ORAL | 3 refills | Status: DC

## 2019-08-03 ENCOUNTER — Other Ambulatory Visit: Payer: Self-pay | Admitting: Adult Health

## 2019-08-03 NOTE — Telephone Encounter (Signed)
Sent to the pharmacy by e-scribe. 

## 2019-08-28 ENCOUNTER — Other Ambulatory Visit: Payer: Self-pay | Admitting: Adult Health

## 2019-08-30 NOTE — Telephone Encounter (Signed)
Sent to the pharmacy by e-scribe. 

## 2019-09-13 ENCOUNTER — Other Ambulatory Visit: Payer: Self-pay | Admitting: Adult Health

## 2019-09-14 NOTE — Telephone Encounter (Signed)
Sent to the pharmacy by e-scribe. 

## 2019-09-21 ENCOUNTER — Telehealth: Payer: Self-pay | Admitting: Adult Health

## 2019-09-21 NOTE — Telephone Encounter (Signed)
Copied from [place] [phone]. Topic: Appointment Scheduling - Scheduling Inquiry for Clinic >> Sep 13, 2019 10:40 AM Lennox Solders wrote: Reason for CRM:pt is calling to schedule AWV   Can Tommi Rumps do his AWV or do we need to wait till we get a health coach?

## 2019-11-05 ENCOUNTER — Other Ambulatory Visit: Payer: Self-pay | Admitting: Adult Health

## 2019-11-09 NOTE — Telephone Encounter (Signed)
Sent to the pharmacy by e-scribe. 

## 2019-12-08 ENCOUNTER — Other Ambulatory Visit: Payer: Self-pay | Admitting: Adult Health

## 2019-12-08 DIAGNOSIS — I10 Essential (primary) hypertension: Secondary | ICD-10-CM

## 2020-01-17 ENCOUNTER — Ambulatory Visit (AMBULATORY_SURGERY_CENTER): Payer: Self-pay | Admitting: *Deleted

## 2020-01-17 ENCOUNTER — Other Ambulatory Visit: Payer: Self-pay

## 2020-01-17 VITALS — Temp 97.1°F | Ht 68.5 in | Wt 220.0 lb

## 2020-01-17 DIAGNOSIS — Z01818 Encounter for other preprocedural examination: Secondary | ICD-10-CM

## 2020-01-17 DIAGNOSIS — K219 Gastro-esophageal reflux disease without esophagitis: Secondary | ICD-10-CM

## 2020-01-17 NOTE — Progress Notes (Signed)

## 2020-01-23 ENCOUNTER — Encounter: Payer: Self-pay | Admitting: Internal Medicine

## 2020-01-27 ENCOUNTER — Ambulatory Visit (INDEPENDENT_AMBULATORY_CARE_PROVIDER_SITE_OTHER): Payer: Medicare Other

## 2020-01-27 ENCOUNTER — Other Ambulatory Visit: Payer: Self-pay | Admitting: Internal Medicine

## 2020-01-27 DIAGNOSIS — Z1159 Encounter for screening for other viral diseases: Secondary | ICD-10-CM | POA: Diagnosis not present

## 2020-01-28 LAB — SARS CORONAVIRUS 2 (TAT 6-24 HRS): SARS Coronavirus 2: NEGATIVE

## 2020-01-31 ENCOUNTER — Ambulatory Visit (AMBULATORY_SURGERY_CENTER): Payer: Medicare Other | Admitting: Internal Medicine

## 2020-01-31 ENCOUNTER — Other Ambulatory Visit: Payer: Self-pay

## 2020-01-31 ENCOUNTER — Encounter: Payer: Self-pay | Admitting: Internal Medicine

## 2020-01-31 VITALS — BP 124/75 | HR 62 | Temp 96.8°F | Resp 16 | Ht 68.5 in | Wt 220.0 lb

## 2020-01-31 DIAGNOSIS — K222 Esophageal obstruction: Secondary | ICD-10-CM | POA: Diagnosis not present

## 2020-01-31 DIAGNOSIS — K2101 Gastro-esophageal reflux disease with esophagitis, with bleeding: Secondary | ICD-10-CM

## 2020-01-31 DIAGNOSIS — R1310 Dysphagia, unspecified: Secondary | ICD-10-CM

## 2020-01-31 DIAGNOSIS — K219 Gastro-esophageal reflux disease without esophagitis: Secondary | ICD-10-CM | POA: Diagnosis not present

## 2020-01-31 DIAGNOSIS — K2970 Gastritis, unspecified, without bleeding: Secondary | ICD-10-CM

## 2020-01-31 DIAGNOSIS — K2990 Gastroduodenitis, unspecified, without bleeding: Secondary | ICD-10-CM

## 2020-01-31 MED ORDER — SODIUM CHLORIDE 0.9 % IV SOLN: 500.0000 mL | Freq: Once | INTRAVENOUS | Status: DC

## 2020-01-31 MED ORDER — PANTOPRAZOLE SODIUM 40 MG PO TBEC: 40.0000 mg | DELAYED_RELEASE_TABLET | Freq: Two times a day (BID) | ORAL | 11 refills | Status: DC

## 2020-01-31 NOTE — Progress Notes (Signed)
PT taken to PACU. Monitors in place. VSS. Report given to RN. 

## 2020-01-31 NOTE — Op Note (Signed)
[name] Procedure Date: 01/31/2020 8:03 AM MRN: [id number] Endoscopist: Docia Chuck. Jared [name] , MD Age: 74 Referring MD:  Date of Birth: [birth year] Gender: Male Account #: [id number] Procedure:                Upper GI endoscopy with biopsy; with balloon                            dilation?"18 mm Indications:              Therapeutic procedure, Dysphagia Medicines:                Monitored Anesthesia Care Procedure:                Pre-Anesthesia Assessment:                           - Prior to the procedure, a History and Physical                            was performed, and patient medications and                            allergies were reviewed. The patient's tolerance of                            previous anesthesia was also reviewed. The risks                            and benefits of the procedure and the sedation                            options and risks were discussed with the patient.                            All questions were answered, and informed consent                            was obtained. Prior Anticoagulants: The patient has                            taken no previous anticoagulant or antiplatelet                            agents. ASA Grade Assessment: II - A patient with                            mild systemic disease. After reviewing the risks                            and benefits, the patient was deemed in                            satisfactory condition to undergo the procedure.  After obtaining informed consent, the endoscope was                            passed under direct vision. Throughout the                            procedure, the patient's blood pressure, pulse, and                            oxygen saturations were monitored continuously. The                            Endoscope was introduced through the mouth, and                            advanced to the second part of  duodenum. The upper                            GI endoscopy was accomplished without difficulty.                            The patient tolerated the procedure well. Scope In: Scope Out: Findings:                 One benign-appearing, intrinsic moderate stenosis                            was found 38 cm from the incisors. This stenosis                            measured 1.2 cm (inner diameter) and was associated                            with erosive esophagitis. The stenosis was                            traversed. A TTS dilator was passed through the                            scope. Dilation with a 15-16.5-18 mm balloon                            dilator was performed to 18 mm.                           The stomach revealed multiple small antral                            erosions. Biopsies were taken with a cold forceps                            for histology. Biopsies were taken with a cold  forceps for Helicobacter pylori testing using                            CLOtest. A moderate sized hiatal hernia was present                           The examined duodenum revealed bulbar erosions but                            was otherwise normal.                           The cardia and gastric fundus were normal on                            retroflexion. Complications:            No immediate complications. Estimated Blood Loss:     Estimated blood loss: none. Impression:               1. Erosive esophagitis and peptic stricture status                            post dilation to 18 mm                           2. Gastric and duodenal erosions status post CLO                            biopsy. Recommendation:           1. Post dilation diet                           2. Prescribe pantoprazole 40 mg twice daily; #60;                            11 refills                           3. Follow-up CLO biopsy                           4. Office follow-up with Dr.  Henrene [name] in 6 to 8 weeks. Docia Chuck. Jared Pastor, MD 01/31/2020 8:30:18 AM This report has been signed electronically.

## 2020-01-31 NOTE — Patient Instructions (Signed)
YOU HAD AN ENDOSCOPIC PROCEDURE TODAY AT [facility]:   Refer to the procedure report that was given to you for any specific questions about what was found during the examination.  If the procedure report does not answer your questions, please call your gastroenterologist to clarify.  If you requested that your care partner not be given the details of your procedure findings, then the procedure report has been included in a sealed envelope for you to review at your convenience later.  YOU SHOULD EXPECT: Some feelings of bloating in the abdomen. Passage of more gas than usual.  Walking can help get rid of the air that was put into your GI tract during the procedure and reduce the bloating. If you had a lower endoscopy (such as a colonoscopy or flexible sigmoidoscopy) you may notice spotting of blood in your stool or on the toilet paper. If you underwent a bowel prep for your procedure, you may not have a normal bowel movement for a few days.  Please Note:  You might notice some irritation and congestion in your nose or some drainage.  This is from the oxygen used during your procedure.  There is no need for concern and it should clear up in a day or so.  SYMPTOMS TO REPORT IMMEDIATELY:    Following upper endoscopy (EGD)  Vomiting of blood or coffee ground material  New chest pain or pain under the shoulder blades  Painful or persistently difficult swallowing  New shortness of breath  Fever of 100F or higher  Black, tarry-looking stools  For urgent or emergent issues, a gastroenterologist can be reached at any hour by calling [phone].   DIET:   FOLLOW DILATION HANDOUT.  ACTIVITY:  You should plan to take it easy for the rest of today and you should NOT DRIVE or use heavy machinery until tomorrow (because of the sedation medicines used during the test).    FOLLOW UP: Our staff will call the number listed on your records 48-72 hours following your procedure to check on  you and address any questions or concerns that you may have regarding the information given to you following your procedure. If we do not reach you, we will leave a message.  We will attempt to reach you two times.  During this call, we will ask if you have developed any symptoms of COVID 19. If you develop any symptoms (ie: fever, flu-like symptoms, shortness of breath, cough etc.) before then, please call [phone].  If you test positive for Covid 19 in the 2 weeks post procedure, please call and report this information to [place].    If any biopsies were taken you will be contacted by phone or by letter within the next 1-3 weeks.  Please call us at [phone] if you have not heard about the biopsies in 3 weeks.    SIGNATURES/CONFIDENTIALITY: You and/or your care partner have signed paperwork which will be entered into your electronic medical record.  These signatures attest to the fact that that the information above on your After Visit Summary has been reviewed and is understood.  Full responsibility of the confidentiality of this discharge information lies with you and/or your care-partner.   Start pantoprazole 40 mg twice daily. [facility] Resume remainder of medication. Follow up with Dr. [name] in office in 6 to 8 weeks. Follow dilation handout. Information given on esophagitis,gastritis and dilation diet.

## 2020-01-31 NOTE — Progress Notes (Signed)
Called to room to assist during endoscopic procedure.  Patient ID and intended procedure confirmed with present staff. Received instructions for my participation in the procedure from the performing physician.  

## 2020-01-31 NOTE — Progress Notes (Signed)
Temp by JB and vitals by DT 

## 2020-02-01 LAB — HELICOBACTER PYLORI SCREEN-BIOPSY: UREASE: NEGATIVE

## 2020-02-02 ENCOUNTER — Telehealth: Payer: Self-pay

## 2020-02-02 NOTE — Telephone Encounter (Signed)
  Follow up Call-  Call back number 01/31/2020 05/27/2018 01/26/2018 12/03/2017 10/27/2017  Post procedure Call Back phone  # [phone]  Permission to leave phone message Yes Yes Yes Yes Yes  Some recent data might be hidden     Patient questions:  Do you have a fever, pain , or abdominal swelling? No. Pain Score  0 *  Have you tolerated food without any problems? Yes.    Have you been able to return to your normal activities? Yes.    Do you have any questions about your discharge instructions: Diet   No. Medications  No. Follow up visit  No.  Do you have questions or concerns about your Care? No.  Actions: * If pain score is 4 or above: No action needed, pain <4. 1. Have you developed a fever since your procedure? no  2.   Have you had an respiratory symptoms (SOB or cough) since your procedure? no  3.   Have you tested positive for COVID 19 since your procedure no  4.   Have you had any family members/close contacts diagnosed with the COVID 19 since your procedure?  no   If yes to any of these questions please route to Joylene John, RN and Alphonsa Gin, Therapist, sports.

## 2020-03-21 ENCOUNTER — Ambulatory Visit: Payer: Medicare Other | Admitting: Internal Medicine

## 2020-03-21 ENCOUNTER — Encounter: Payer: Self-pay | Admitting: Internal Medicine

## 2020-03-21 VITALS — BP 156/94 | HR 64 | Temp 98.1°F | Ht 68.0 in | Wt 228.0 lb

## 2020-03-21 DIAGNOSIS — K2990 Gastroduodenitis, unspecified, without bleeding: Secondary | ICD-10-CM

## 2020-03-21 DIAGNOSIS — K2101 Gastro-esophageal reflux disease with esophagitis, with bleeding: Secondary | ICD-10-CM

## 2020-03-21 DIAGNOSIS — K219 Gastro-esophageal reflux disease without esophagitis: Secondary | ICD-10-CM

## 2020-03-21 DIAGNOSIS — K2970 Gastritis, unspecified, without bleeding: Secondary | ICD-10-CM | POA: Diagnosis not present

## 2020-03-21 DIAGNOSIS — K222 Esophageal obstruction: Secondary | ICD-10-CM

## 2020-03-21 MED ORDER — PANTOPRAZOLE SODIUM 40 MG PO TBEC: 40.0000 mg | DELAYED_RELEASE_TABLET | Freq: Two times a day (BID) | ORAL | 3 refills | Status: DC

## 2020-03-21 NOTE — Patient Instructions (Signed)
We have sent the following medications to your pharmacy for you to pick up at your convenience:  Pantoprazole  Please follow up in one year  

## 2020-03-21 NOTE — Progress Notes (Signed)
HISTORY OF PRESENT ILLNESS:  Jared [name] is a 74 y.o. male, employee of [name], with multiple significant medical problems with a history of GERD complicated by erosive esophagitis and peptic stricture formation.  Last seen in this office May 2019.  Last screening colonoscopy June 2019 with large hyperplastic polyp and diverticulosis.  Recently underwent upper endoscopy for recurrent dysphagia and active reflux symptoms on once daily PPI.  This was performed January 31, 2020.  He was found to have distal esophageal stricture with associated erosive esophagitis and antral erosions.  Testing for Helicobacter pylori was negative.  He was dilated to a maximal diameter of 18 mm.  Placed on pantoprazole 40 mg twice daily.  Presents for follow-up at this time.  He is pleased to report Jared [name] no reflux symptoms.  No further problems with dysphagia.  He has multiple questions.  He does invite his wife via his cell phone (speaker phone) to join [place].  He has questions regarding his condition.  He has questions regarding his treatment.  REVIEW OF SYSTEMS:  All non-GI ROS negative unless otherwise stated in the HPI except for gout  Past Medical History:  Diagnosis Date  . Childhood asthma   . 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  . GERD (gastroesophageal reflux disease)   . Gout 07/26/2007   "on daily RX"  (12/15/2018)  . HYPERCHOLESTEROLEMIA, BORDERLINE 01/16/2010  . HYPERTENSION 07/26/2007  . OSA (obstructive sleep apnea)    "haven't used mask in quite awhile" (12/15/2018)  . OSTEOARTHRITIS 09/28/2007  . PLANTAR FASCIITIS, BILATERAL 04/19/2010  . PROSTATITIS, RECURRENT 11/08/2009  . RENAL INSUFFICIENCY 07/26/2007  . Sleep apnea    off Cpap   . Stroke ([place]) 01/21/2013   "fully recovered" (12/15/2018)  . Type II diabetes mellitus ([place])   . 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 URINARY FREQUENCY, CHRONIC 03/10/2008    Past Surgical History:  Procedure Laterality  Date  . COLONOSCOPY    . CYST EXCISION Left    "leg"  . TONSILLECTOMY    . UPPER GASTROINTESTINAL ENDOSCOPY    . WRIST SURGERY Right    "cut bone out; for gout""    Social History Jared [name]  reports that he has never smoked. He has never used smokeless tobacco. He reports previous alcohol use. He reports that he does not use drugs.  family history includes CVA in his father; Colon polyps in his father; Diabetes in his sister; Heart disease in his father and mother.  Allergies  Allergen Reactions  . Strawberry Extract Shortness Of Breath       PHYSICAL EXAMINATION: Vital signs: BP (!) 156/94   Pulse 64   Temp 98.1 F (36.7 C)   Ht 5\' 8"  (1.727 m)   Wt 228 lb (103.4 kg)   BMI 34.67 kg/m   Constitutional: generally well-appearing, no acute distress Psychiatric: alert and oriented x3, cooperative Eyes: extraocular movements intact, anicteric, conjunctiva pink Abdomen: Not reexamined Neuro: No focal deficits.    ASSESSMENT:  1.  GERD complicated by erosive esophagitis and peptic stricture.  Currently asymptomatic post dilation on twice daily PPI 2.  Screening colonoscopy 2019 with large hyperplastic polyp   PLAN:  1.  Reflux precautions 2.  Continue pantoprazole 40 mg twice daily 3.  Prescription refilled.  Medication risks reviewed 4.  Routine office follow-up 1 year.  Sooner for questions or problems 30 minutes total time was spent preparing to see the patient, reviewing endoscopy report, laboratories, obtaining history, performing  relevant exam, educating the patient and his wife regarding his condition, ordering medications and reviewing the risks, and documenting clinical information in the health record.

## 2020-05-13 ENCOUNTER — Other Ambulatory Visit: Payer: Self-pay | Admitting: Adult Health

## 2020-05-13 DIAGNOSIS — I10 Essential (primary) hypertension: Secondary | ICD-10-CM

## 2020-07-22 ENCOUNTER — Other Ambulatory Visit: Payer: Self-pay | Admitting: Adult Health

## 2020-07-22 DIAGNOSIS — I10 Essential (primary) hypertension: Secondary | ICD-10-CM

## 2020-07-23 NOTE — Telephone Encounter (Signed)
The patient is also taking this medication 2 in the morning and 2 at night.  That is not what the instructions say. The patient might need to be made aware on how to take this medication.

## 2020-07-23 NOTE — Telephone Encounter (Signed)
The patient is wanting to know if we can call the pharmacy for the patient to get a few tablets to hold him over till we get the PA approved for lisinopril (ZESTRIL) 10 MG tablet    He needs this medication so he don't have another stroke.  [id number] [address], [place] Phone:  [phone]  Fax:  [phone]

## 2020-07-24 NOTE — Telephone Encounter (Signed)
[place] TO THE PHARMACY.  PT IS PAST DUE FOR CPX.

## 2020-08-07 ENCOUNTER — Other Ambulatory Visit: Payer: Self-pay | Admitting: Adult Health

## 2020-08-07 DIAGNOSIS — I10 Essential (primary) hypertension: Secondary | ICD-10-CM

## 2020-08-09 NOTE — Telephone Encounter (Signed)
Patient need to schedule an ov for more refills. 

## 2020-08-17 ENCOUNTER — Other Ambulatory Visit: Payer: Self-pay | Admitting: Adult Health

## 2020-08-17 DIAGNOSIS — I10 Essential (primary) hypertension: Secondary | ICD-10-CM

## 2020-08-17 NOTE — Telephone Encounter (Signed)
Patient need to schedule an ov for more refills. 

## 2020-08-21 ENCOUNTER — Other Ambulatory Visit: Payer: Self-pay | Admitting: Adult Health

## 2020-08-21 NOTE — Telephone Encounter (Signed)
Pt has been scheduled for a CPE 08/23/2020 at 2:00 p.m.

## 2020-08-22 ENCOUNTER — Ambulatory Visit: Payer: Medicare Other | Admitting: Adult Health

## 2020-08-22 MED ORDER — LABETALOL HCL 200 MG PO TABS: 400.0000 mg | ORAL_TABLET | Freq: Two times a day (BID) | ORAL | 0 refills | Status: DC

## 2020-08-22 NOTE — Addendum Note (Signed)
Addended by: Nathanial Millman E on: 08/22/2020 11:24 AM   Modules accepted: Orders

## 2020-08-23 ENCOUNTER — Ambulatory Visit (INDEPENDENT_AMBULATORY_CARE_PROVIDER_SITE_OTHER): Payer: Medicare Other | Admitting: Adult Health

## 2020-08-23 ENCOUNTER — Encounter: Payer: Self-pay | Admitting: Adult Health

## 2020-08-23 ENCOUNTER — Other Ambulatory Visit: Payer: Self-pay

## 2020-08-23 VITALS — BP 132/88 | HR 73 | Temp 98.3°F | Ht 68.0 in | Wt 228.0 lb

## 2020-08-23 DIAGNOSIS — N1832 Chronic kidney disease, stage 3b: Secondary | ICD-10-CM | POA: Diagnosis not present

## 2020-08-23 DIAGNOSIS — Z0000 Encounter for general adult medical examination without abnormal findings: Secondary | ICD-10-CM

## 2020-08-23 DIAGNOSIS — E782 Mixed hyperlipidemia: Secondary | ICD-10-CM | POA: Diagnosis not present

## 2020-08-23 DIAGNOSIS — E1121 Type 2 diabetes mellitus with diabetic nephropathy: Secondary | ICD-10-CM

## 2020-08-23 DIAGNOSIS — Z125 Encounter for screening for malignant neoplasm of prostate: Secondary | ICD-10-CM

## 2020-08-23 DIAGNOSIS — I10 Essential (primary) hypertension: Secondary | ICD-10-CM | POA: Diagnosis not present

## 2020-08-23 DIAGNOSIS — M1A00X1 Idiopathic chronic gout, unspecified site, with tophus (tophi): Secondary | ICD-10-CM | POA: Diagnosis not present

## 2020-08-23 DIAGNOSIS — K219 Gastro-esophageal reflux disease without esophagitis: Secondary | ICD-10-CM

## 2020-08-23 MED ORDER — LISINOPRIL 10 MG PO TABS: 20.0000 mg | ORAL_TABLET | Freq: Every day | ORAL | 3 refills | Status: DC

## 2020-08-23 MED ORDER — LABETALOL HCL 200 MG PO TABS: 400.0000 mg | ORAL_TABLET | Freq: Two times a day (BID) | ORAL | 0 refills | Status: DC

## 2020-08-23 MED ORDER — ALLOPURINOL 100 MG PO TABS: 100.0000 mg | ORAL_TABLET | Freq: Two times a day (BID) | ORAL | 3 refills | Status: DC

## 2020-08-23 MED ORDER — SIMVASTATIN 20 MG PO TABS: ORAL_TABLET | ORAL | 3 refills | Status: DC

## 2020-08-23 NOTE — Progress Notes (Addendum)
Subjective:    Patient ID: Jared [name], male    DOB: 1946-01-08, 74 y.o.   MRN: [id number]  HPI Patient presents for yearly preventative medicine examination. 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  DM-is currently prescribed Metformin 500 mg twice daily.  He does not check his blood sugars at home.  He does stay active but does not formally exercise.  He does try to eat healthy Lab Results  Component Value Date   HGBA1C 6.2 06/30/2019   Essential Hypertension -currently prescribed Norvasc 10 mg daily, BiDil 20-37.5 mg 3 times daily, hydralazine 25 mg 3 times daily, labetalol 400 mg twice daily, and lisinopril 10 mg twice daily.  He denies chest pain, shortness of breath, dizziness, or lightheadedness. BP Readings from Last 3 Encounters:  08/23/20 132/88  03/21/20 (!) 156/94  01/31/20 124/75     CKD stage III-followed by nephrology at [facility].  He is seen every 6 months.  Was last seen in November 2020 at which time there was some mild progression of his disease but no medication changes.  Hyperlipidemia-takes simvastatin 20 mg daily and aspirin 325 mg daily.  He denies myalgia or fatigue Lab Results  Component Value Date   CHOL 120 06/30/2019   HDL 32.60 (L) 06/30/2019   LDLCALC 61 06/30/2019   LDLDIRECT 85.1 06/03/2013   TRIG 131.0 06/30/2019   CHOLHDL 4 06/30/2019   History of gout-takes allopurinol 200  mg daily.  He has not had any recent gout flares  GERD-controlled with Protonix 40 mg  All immunizations and health maintenance protocols were reviewed with the patient and needed orders were placed. He is up to date on routine vaccinations   Appropriate screening laboratory values were ordered for the patient including screening of hyperlipidemia, renal function and hepatic function. If indicated by BPH, a PSA was ordered.  Medication reconciliation,  past medical history, social history, problem list and allergies were reviewed in detail with the patient  Goals were established with regard to weight loss, exercise, and  diet in compliance with medications. He is walking daily. Tries to eat healthy.   Wt Readings from Last 3 Encounters:  08/23/20 228 lb (103.4 kg)  03/21/20 228 lb (103.4 kg)  01/31/20 220 lb (99.8 kg)   He is up-to-date on routine colon cancer screening  He has no acute complaints today   Review of Systems  Constitutional: Negative.   HENT: Negative.   Eyes: Negative.   Respiratory: Negative.   Cardiovascular: Negative.   Gastrointestinal: Negative.   Endocrine: Negative.   Genitourinary: Negative.   Musculoskeletal: Negative.   Skin: Negative.   Allergic/Immunologic: Negative.   Neurological: Negative.   Hematological: Negative.   Psychiatric/Behavioral: Negative.   All other systems reviewed and are negative.  Past Medical History:  Diagnosis Date  . Childhood asthma   . 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  . GERD (gastroesophageal reflux disease)   . Gout 07/26/2007   "on daily RX"  (12/15/2018)  . HYPERCHOLESTEROLEMIA, BORDERLINE 01/16/2010  . HYPERTENSION 07/26/2007  . OSA (obstructive sleep apnea)    "haven't used mask  in quite awhile" (12/15/2018)  . OSTEOARTHRITIS 09/28/2007  . PLANTAR FASCIITIS, BILATERAL 04/19/2010  . PROSTATITIS, RECURRENT 11/08/2009  . RENAL INSUFFICIENCY 07/26/2007  . Sleep apnea    off Cpap   . Stroke ([place])  01/21/2013   "fully recovered" (12/15/2018)  . Type II diabetes mellitus ([place])   . 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 URINARY FREQUENCY, CHRONIC 03/10/2008    Social History   Socioeconomic History  . Marital status: Married    Spouse name: [name]  . Number of children: 5  . Years of education: 67  . Highest education level: Not on file  Occupational History  . Occupation: TESTOR    Employer: [employer]: retired  Tobacco Use  . Smoking status: Never Smoker  . Smokeless tobacco: Never Used  Vaping Use  . Vaping Use: Never used  Substance and Sexual Activity  . Alcohol use: Not Currently  . Drug use: Never  . Sexual activity: Not Currently  Other Topics Concern  . Not on file  Social History Narrative   He has a part time job for delivering car parts   Married    4 children - All in [place]      He likes to go to car shows and shoot pool.    Social Determinants of Health   Financial Resource Strain:   . Difficulty of Paying Living Expenses: Not on file  Food Insecurity:   . Worried About Charity fundraiser in the Last Year: Not on file  . Ran Out of Food in the Last Year: Not on file  Transportation Needs:   . Lack of Transportation (Medical): Not on file  . Lack of Transportation (Non-Medical): Not on file  Physical Activity:   . Days of Exercise per Week: Not on file  . Minutes of Exercise per Session: Not on file  Stress:   . Feeling of Stress : Not on file  Social Connections:   . Frequency of Communication with Friends and Family: Not on file  . Frequency of Social Gatherings with Friends and Family: Not on file  . Attends Religious Services: Not on file  . Active Member of Clubs or Organizations: Not on file  . Attends Archivist Meetings: Not on file  . Marital Status: Not on file  Intimate Partner Violence:   . Fear of Current or Ex-Partner: Not on file  . Emotionally Abused: Not on file  . Physically Abused: Not on  file  . Sexually Abused: Not on file    Past Surgical History:  Procedure Laterality Date  . COLONOSCOPY    . CYST EXCISION Left    "leg"  . TONSILLECTOMY    . UPPER GASTROINTESTINAL ENDOSCOPY    . WRIST SURGERY Right    "cut bone out; for gout""    Family History  Problem Relation Age of Onset  . Heart disease Mother   . Heart disease Father   . CVA Father   . Colon polyps Father   . Diabetes Sister   . Colon cancer Neg Hx   . Esophageal cancer Neg Hx   . Stomach cancer Neg Hx   . Pancreatic cancer Neg Hx   . Rectal cancer Neg Hx     Allergies  Allergen Reactions  . Strawberry Extract Shortness Of Breath    Current Outpatient Medications on File Prior to Visit  Medication Sig Dispense Refill  . amLODipine (NORVASC) 10 MG tablet TAKE 1 TABLET  BY MOUTH EVERY DAY 90 tablet 0  . aspirin EC 325 MG tablet Take 1 tablet (325 mg total) by mouth daily.  0  . hydrALAZINE (APRESOLINE) 25 MG tablet TAKE 1 TABLET BY MOUTH THREE TIMES A DAY 90 tablet 1  . metFORMIN (GLUCOPHAGE) 500 MG tablet TAKE 1 TABLET BY MOUTH TWICE A DAY WITH MEALS 180 tablet 0  . pantoprazole (PROTONIX) 40 MG tablet Take 1 tablet (40 mg total) by mouth 2 (two) times daily. 180 tablet 3   No current facility-administered medications on file prior to visit.    BP 132/88   Pulse 73   Temp 98.3 F (36.8 C) (Oral)   Ht '5\' 8"'  (1.727 m)   Wt 228 lb (103.4 kg)   SpO2 99%   BMI 34.67 kg/m       Objective:   Physical Exam Vitals and nursing note reviewed.  Constitutional:      General: He is not in acute distress.    Appearance: Normal appearance. He is well-developed. He is obese.  HENT:     Head: Normocephalic and atraumatic.     Right Ear: Tympanic membrane, ear canal and external ear normal. There is no impacted cerumen.     Left Ear: Tympanic membrane, ear canal and external ear normal. There is no impacted cerumen.     Nose: Nose normal. No congestion or rhinorrhea.     Mouth/Throat:      Mouth: Mucous membranes are moist.     Pharynx: Oropharynx is clear. No oropharyngeal exudate or posterior oropharyngeal erythema.  Eyes:     General:        Right eye: No discharge.        Left eye: No discharge.     Extraocular Movements: Extraocular movements intact.     Conjunctiva/sclera: Conjunctivae normal.     Pupils: Pupils are equal, round, and reactive to light.  Neck:     Vascular: No carotid bruit.     Trachea: No tracheal deviation.  Cardiovascular:     Rate and Rhythm: Normal rate and regular rhythm.     Pulses: Normal pulses.     Heart sounds: Normal heart sounds. No murmur heard.  No friction rub. No gallop.   Pulmonary:     Effort: Pulmonary effort is normal. No respiratory distress.     Breath sounds: Normal breath sounds. No stridor. No wheezing, rhonchi or rales.  Chest:     Chest wall: No tenderness.  Abdominal:     General: Bowel sounds are normal. There is no distension.     Palpations: Abdomen is soft. There is no mass.     Tenderness: There is no abdominal tenderness. There is no right CVA tenderness, left CVA tenderness, guarding or rebound.     Hernia: No hernia is present.  Musculoskeletal:        General: No swelling, tenderness, deformity or signs of injury. Normal range of motion.     Right lower leg: No edema.     Left lower leg: No edema.  Lymphadenopathy:     Cervical: No cervical adenopathy.  Skin:    General: Skin is warm and dry.     Capillary Refill: Capillary refill takes less than 2 seconds.     Coloration: Skin is not jaundiced or pale.     Findings: No bruising, erythema, lesion or rash.  Neurological:     General: No focal deficit present.     Mental Status: He is alert and oriented  to person, place, and time.     Cranial Nerves: No cranial nerve deficit.     Sensory: No sensory deficit.     Motor: No weakness.     Coordination: Coordination normal.     Gait: Gait normal.     Deep Tendon Reflexes: Reflexes normal.    Psychiatric:        Mood and Affect: Mood normal.        Behavior: Behavior normal.        Thought Content: Thought content normal.        Judgment: Judgment normal.       Assessment & Plan:  1. Routine general medical examination at a health care facility - Needs to work on weight loss. Weight is up 8 pounds.  - Follow up in one year or sooner if needed - CBC with Differential/Platelet; Future - Hemoglobin A1c; Future - Lipid panel; Future - TSH; Future - CMP with eGFR([facility]); Future - CMP with eGFR([facility]) - TSH - Lipid panel - Hemoglobin A1c - CBC with Differential/Platelet  2. Essential hypertension  - CBC with Differential/Platelet; Future - Hemoglobin A1c; Future - Lipid panel; Future - TSH; Future - CMP with eGFR([facility]); Future - labetalol (NORMODYNE) 200 MG tablet; Take 2 tablets (400 mg total) by mouth 2 (two) times daily. **DUE FOR YEARLY PHYSICAL**  Dispense: 120 tablet; Refill: 0 - lisinopril (ZESTRIL) 10 MG tablet; Take 2 tablets (20 mg total) by mouth daily.  Dispense: 180 tablet; Refill: 3 - CMP with eGFR([facility]) - TSH - Lipid panel - Hemoglobin A1c - CBC with Differential/Platelet  3. CHRONIC GOUTY ARTHROPATHY WITH TOPHUS  - allopurinol (ZYLOPRIM) 100 MG tablet; Take 1 tablet (100 mg total) by mouth 2 (two) times daily.  Dispense: 180 tablet; Refill: 3  4. Mixed hyperlipidemia - Consider increase in statin  - CBC with Differential/Platelet; Future - Hemoglobin A1c; Future - Lipid panel; Future - TSH; Future - CMP with eGFR([facility]); Future - CMP with eGFR([facility]) - TSH - Lipid panel - Hemoglobin A1c - CBC with Differential/Platelet  5. Type 2 diabetes mellitus with diabetic nephropathy, without long-term current use of insulin (HCC) - Consider increase in metformin  - Follow up in 6 months  - CBC with Differential/Platelet; Future - Hemoglobin A1c; Future - Lipid panel; Future - TSH; Future - CMP with eGFR([facility]); Future - CMP with  eGFR([facility]) - TSH - Lipid panel - Hemoglobin A1c - CBC with Differential/Platelet  6. Gastroesophageal reflux disease without esophagitis - Continue Protonix   7. Prostate cancer screening  - PSA; Future - PSA  8. Stage 3b chronic kidney disease -Follow-up with nephrology as directed - CBC with Differential/Platelet; Future - Hemoglobin A1c; Future - Lipid panel; Future - TSH; Future - CMP with eGFR([facility]); Future - CMP with eGFR([facility]) - TSH - Lipid panel - Hemoglobin A1c - CBC with Differential/Platelet  Dorothyann Peng, NP

## 2020-08-23 NOTE — Patient Instructions (Addendum)
It was great seeing you today   We will follow up with you regarding your blood work   Continue to walk and eat healthy

## 2020-08-24 LAB — CBC WITH DIFFERENTIAL/PLATELET
Absolute Monocytes: 713 cells/uL (ref 200–950)
Basophils Absolute: 81 cells/uL (ref 0–200)
Basophils Relative: 1.4 %
Eosinophils Absolute: 412 cells/uL (ref 15–500)
Eosinophils Relative: 7.1 %
HCT: 36.8 % — ABNORMAL LOW (ref 38.5–50.0)
Hemoglobin: 12.7 g/dL — ABNORMAL LOW (ref 13.2–17.1)
Lymphs Abs: 1862 cells/uL (ref 850–3900)
MCH: 30.7 pg (ref 27.0–33.0)
MCHC: 34.5 g/dL (ref 32.0–36.0)
MCV: 88.9 fL (ref 80.0–100.0)
MPV: 11 fL (ref 7.5–12.5)
Monocytes Relative: 12.3 %
Neutro Abs: 2732 cells/uL (ref 1500–7800)
Neutrophils Relative %: 47.1 %
Platelets: 229 10*3/uL (ref 140–400)
RBC: 4.14 10*6/uL — ABNORMAL LOW (ref 4.20–5.80)
RDW: 13.5 % (ref 11.0–15.0)
Total Lymphocyte: 32.1 %
WBC: 5.8 10*3/uL (ref 3.8–10.8)

## 2020-08-24 LAB — LIPID PANEL
Cholesterol: 154 mg/dL (ref <200)
HDL: 31 mg/dL — ABNORMAL LOW (ref >=40)
LDL Cholesterol (Calc): 91 mg/dL (calc)
Non-HDL Cholesterol (Calc): 123 mg/dL (calc) (ref <130)
Total CHOL/HDL Ratio: 5 (calc) — ABNORMAL HIGH (ref <5.0)
Triglycerides: 231 mg/dL — ABNORMAL HIGH (ref <150)

## 2020-08-24 LAB — COMPLETE METABOLIC PANEL WITH GFR
AG Ratio: 1.4 (calc) (ref 1.0–2.5)
ALT: 15 U/L (ref 9–46)
AST: 21 U/L (ref 10–35)
Albumin: 4.2 g/dL (ref 3.6–5.1)
Alkaline phosphatase (APISO): 66 U/L (ref 35–144)
BUN/Creatinine Ratio: 10 (calc) (ref 6–22)
BUN: 29 mg/dL — ABNORMAL HIGH (ref 7–25)
CO2: 24 mmol/L (ref 20–32)
Calcium: 9.1 mg/dL (ref 8.6–10.3)
Chloride: 108 mmol/L (ref 98–110)
Creat: 3 mg/dL — ABNORMAL HIGH (ref 0.70–1.18)
GFR, Est African American: 23 mL/min/{1.73_m2} — ABNORMAL LOW (ref >=60)
GFR, Est Non African American: 20 mL/min/{1.73_m2} — ABNORMAL LOW (ref >=60)
Globulin: 2.9 g/dL (calc) (ref 1.9–3.7)
Glucose, Bld: 74 mg/dL (ref 65–99)
Potassium: 4 mmol/L (ref 3.5–5.3)
Sodium: 142 mmol/L (ref 135–146)
Total Bilirubin: 0.5 mg/dL (ref 0.2–1.2)
Total Protein: 7.1 g/dL (ref 6.1–8.1)

## 2020-08-24 LAB — PSA: PSA: 1 ng/mL (ref <=4.0)

## 2020-08-24 LAB — HEMOGLOBIN A1C
Hgb A1c MFr Bld: 5.7 % of total Hgb — ABNORMAL HIGH (ref <5.7)
Mean Plasma Glucose: 117 (calc)
eAG (mmol/L): 6.5 (calc)

## 2020-08-24 LAB — TSH: TSH: 0.96 mIU/L (ref 0.40–4.50)

## 2020-09-12 ENCOUNTER — Telehealth: Payer: Self-pay | Admitting: Adult Health

## 2020-09-12 NOTE — Telephone Encounter (Signed)
Reviewed lab results and recommendations with pt verbalized understanding

## 2020-09-12 NOTE — Telephone Encounter (Signed)
Pt is calling about his lab results. Stated no one has followed up with him about them.  Please call him at [phone]

## 2020-10-10 DIAGNOSIS — N189 Chronic kidney disease, unspecified: Secondary | ICD-10-CM | POA: Diagnosis not present

## 2020-10-10 DIAGNOSIS — N182 Chronic kidney disease, stage 2 (mild): Secondary | ICD-10-CM | POA: Diagnosis not present

## 2020-10-15 DIAGNOSIS — D631 Anemia in chronic kidney disease: Secondary | ICD-10-CM | POA: Diagnosis not present

## 2020-10-15 DIAGNOSIS — N2581 Secondary hyperparathyroidism of renal origin: Secondary | ICD-10-CM | POA: Diagnosis not present

## 2020-10-15 DIAGNOSIS — I129 Hypertensive chronic kidney disease with stage 1 through stage 4 chronic kidney disease, or unspecified chronic kidney disease: Secondary | ICD-10-CM | POA: Diagnosis not present

## 2020-10-15 DIAGNOSIS — N1832 Chronic kidney disease, stage 3b: Secondary | ICD-10-CM | POA: Diagnosis not present

## 2020-10-20 ENCOUNTER — Other Ambulatory Visit: Payer: Self-pay | Admitting: Adult Health

## 2020-10-20 DIAGNOSIS — I10 Essential (primary) hypertension: Secondary | ICD-10-CM

## 2020-10-23 ENCOUNTER — Other Ambulatory Visit: Payer: Self-pay | Admitting: Adult Health

## 2020-10-23 DIAGNOSIS — I10 Essential (primary) hypertension: Secondary | ICD-10-CM

## 2020-12-06 ENCOUNTER — Telehealth: Payer: Self-pay | Admitting: Adult Health

## 2020-12-06 NOTE — Progress Notes (Signed)
  Chronic Care Management   Outreach Note  12/06/2020 Name: Jared [name] MRN: [id number] DOB: 04-22-1946  Referred by: Dorothyann Peng, NP Reason for referral : No chief complaint on file.   An unsuccessful telephone outreach was attempted today. The patient was referred to the pharmacist for assistance with care management and care coordination.   Follow Up Plan:   Carley Perdue [employer] Scheduler

## 2020-12-13 IMAGING — CT CT HEAD W/O CM
4 series · 17 of 47 positions shown, 19 images · non-contrast
Comparison: MRI head 01/21/2013

CLINICAL DATA: Acute headache.

EXAM:
CT HEAD WITHOUT CONTRAST
TECHNIQUE: Contiguous axial images were obtained from the base of the skull
through the vertex without intravenous contrast.

[Series 3: head without · axial · non-contrast · 0.45mm/px · z∈[-73,+52]mm · 7 of 35 slices shown, 9 images]
[im 5/35  brain]
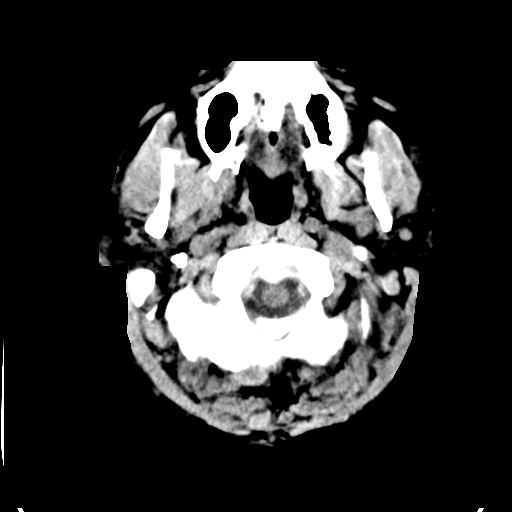
[im 5/35  bone]
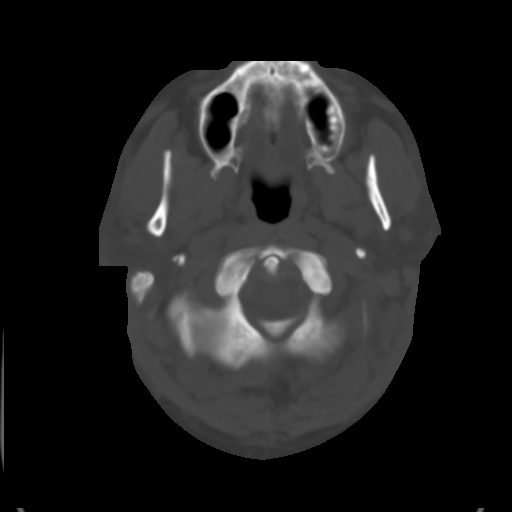
[im 9/35  brain]
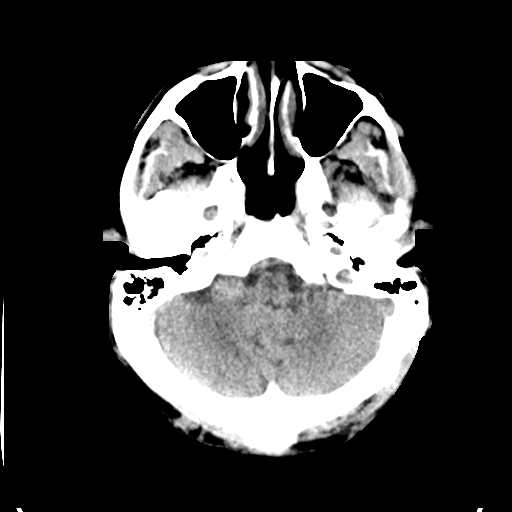
[im 13/35  brain]
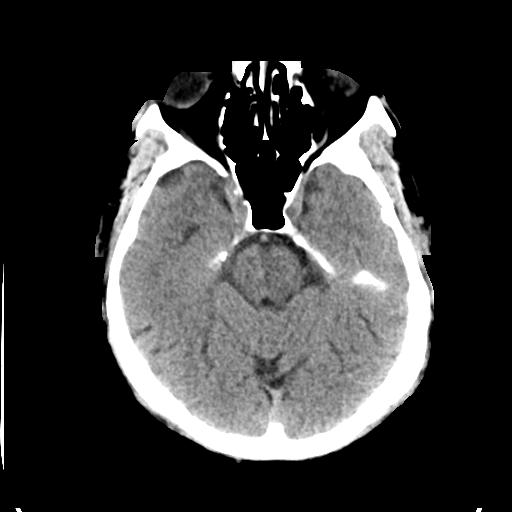
[im 18/35  brain]
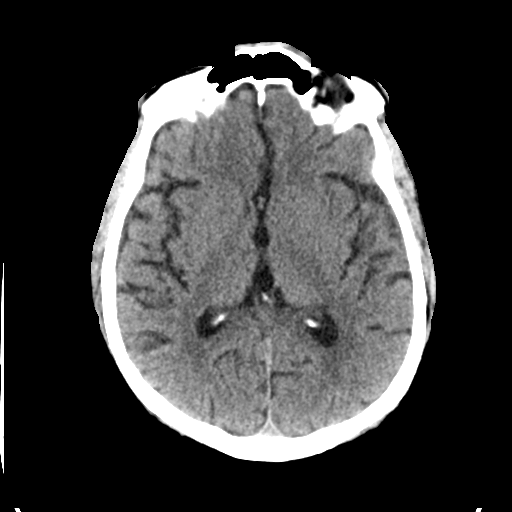
[im 22/35  brain]
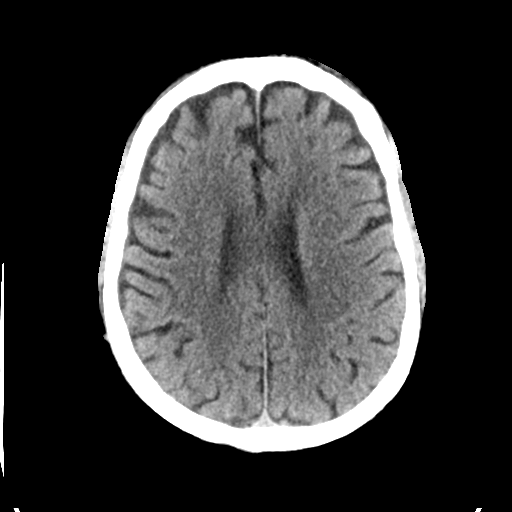
[im 22/35  bone]
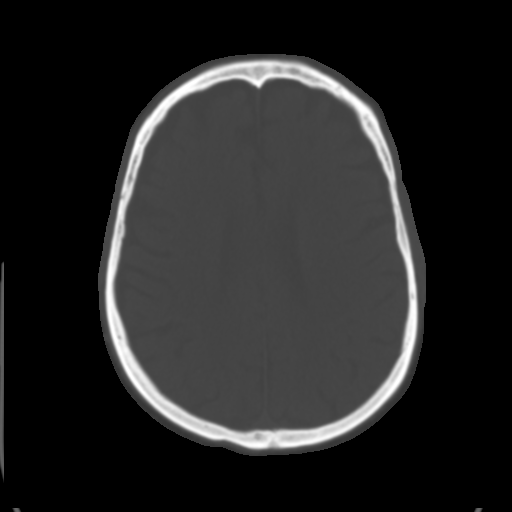
[im 26/35  brain]
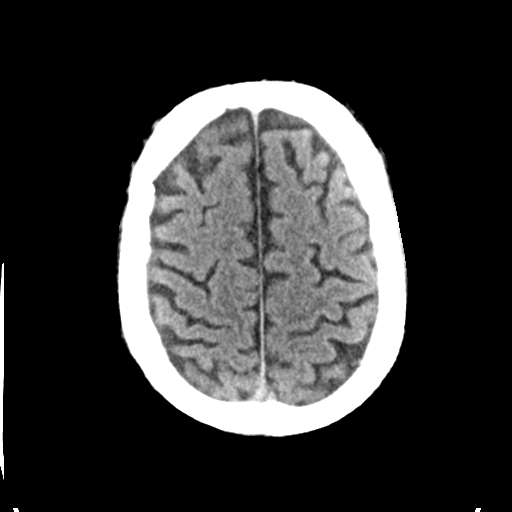
[im 30/35  brain]
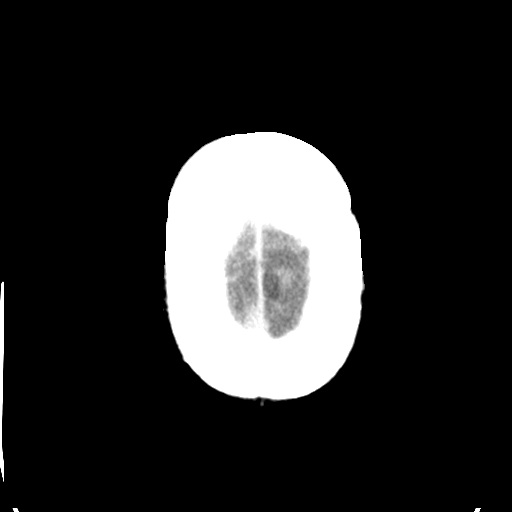

[Series 4: head bone · axial · 0.45mm/px · z∈[-77,-17]mm · 4 of 86 slices shown]
[im 9/86  bone]
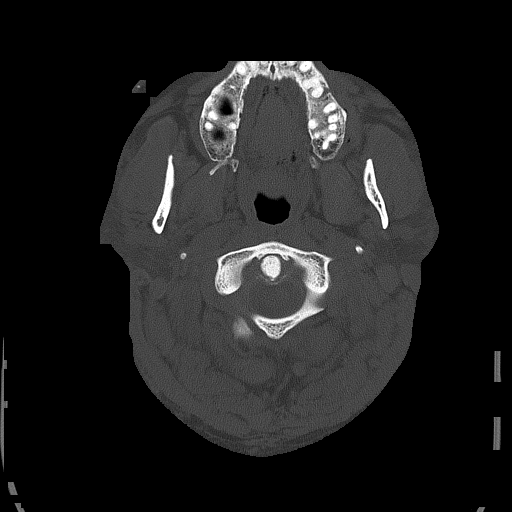
[im 18/86  bone]
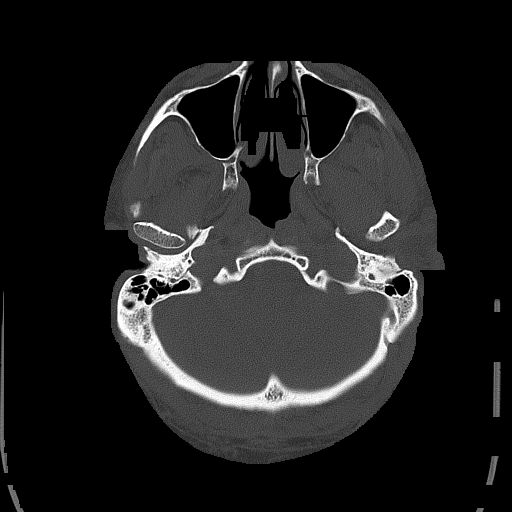
[im 26/86  bone]
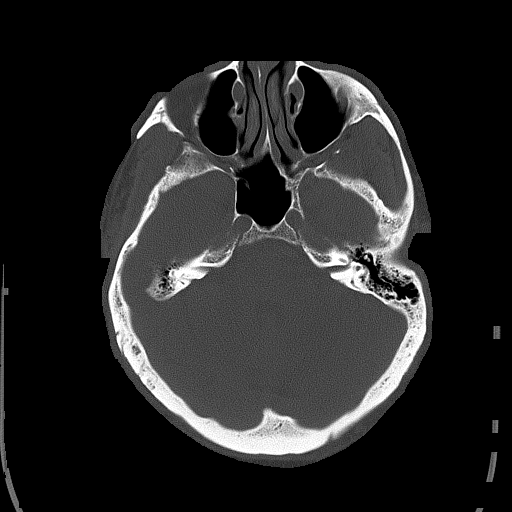
[im 39/86  bone]
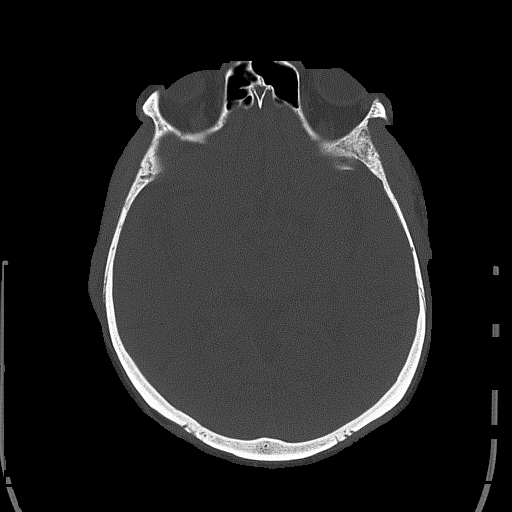

[Series 5: head without cor · coronal · non-contrast · 0.33mm/px · 3 of 69 slices shown]
[im 23/69  brain]
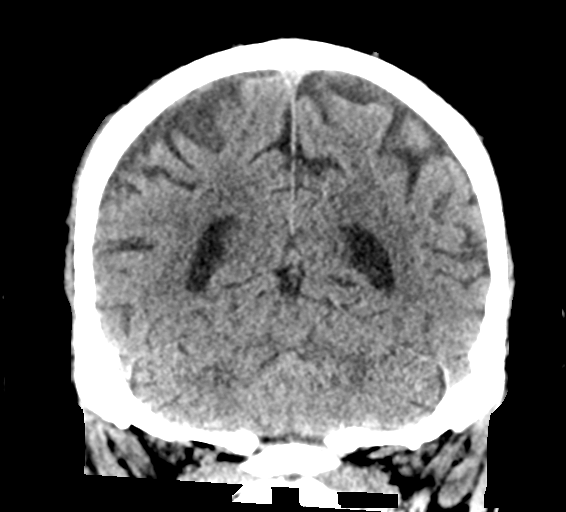
[im 31/69  brain]
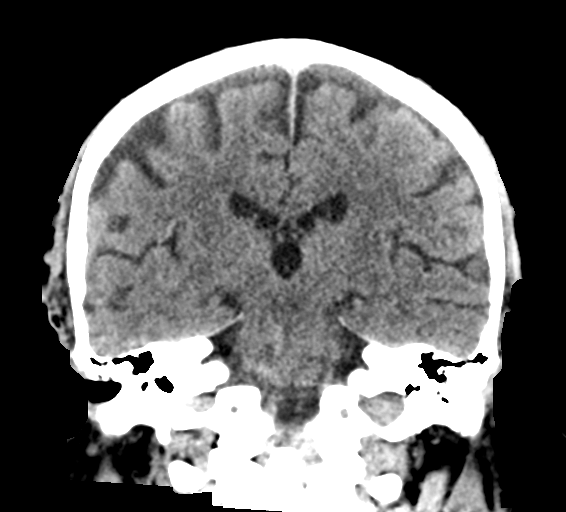
[im 38/69  brain]
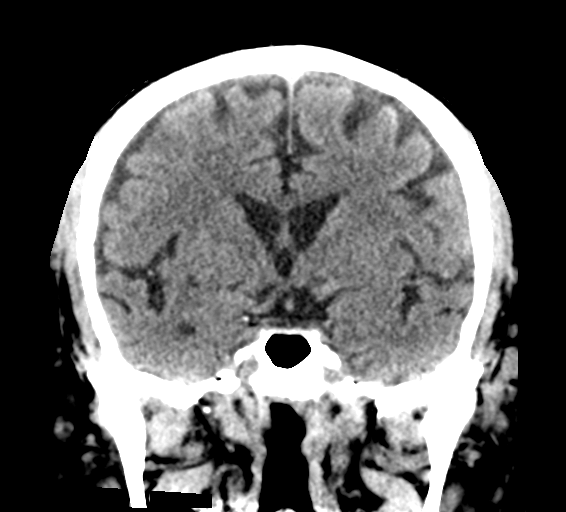

[Series 6: head without sag · sagittal · non-contrast · 0.33mm/px · 3 of 60 slices shown]
[im 20/60  brain]
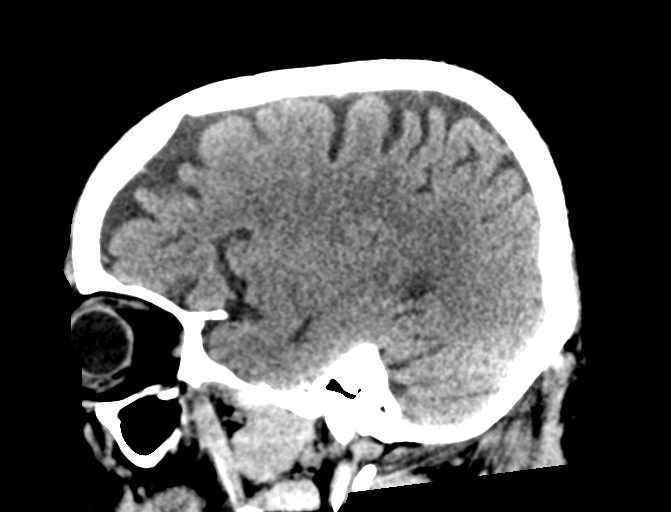
[im 30/60  brain]
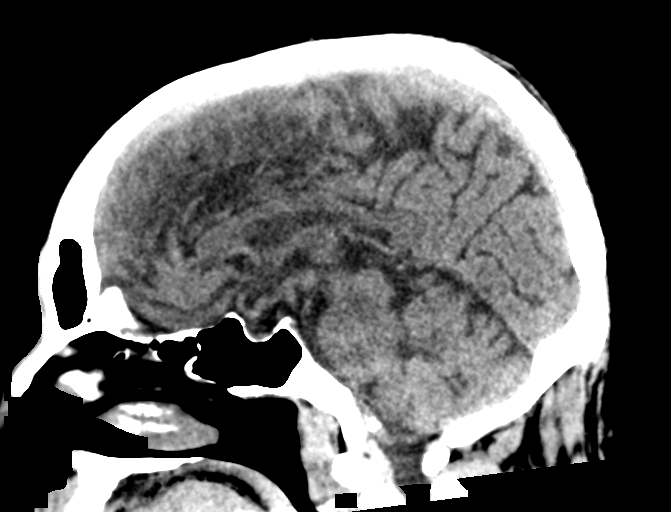
[im 40/60  brain]
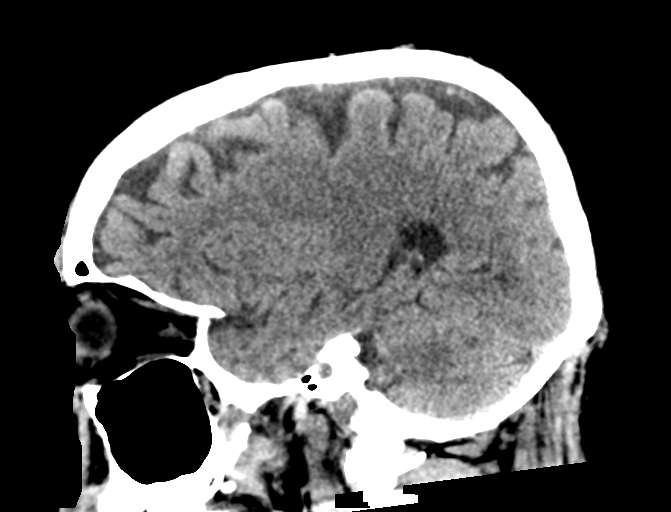

[17 of 47 positions shown; findings below may reference images not displayed]

FINDINGS: Brain: Mild atrophy. Negative for hydrocephalus. Negative for acute
infarct, hemorrhage, mass

Vascular: Negative for hyperdense vessel

Skull: Negative

Sinuses/Orbits: Negative

Other: None
IMPRESSION: Mild atrophy.  No acute abnormality.

## 2020-12-22 ENCOUNTER — Other Ambulatory Visit: Payer: Self-pay | Admitting: Adult Health

## 2020-12-25 ENCOUNTER — Telehealth: Payer: Self-pay | Admitting: Adult Health

## 2020-12-25 NOTE — Telephone Encounter (Signed)
Tried calling patient to  schedule Medicare Annual Wellness Visit (AWV) either virtually or in office. No answer  Last AWV no information  please schedule at anytime with [facility] Nurse Health Advisor 1 or 2   This should be a 45 minute visit. 

## 2021-01-23 ENCOUNTER — Telehealth: Payer: Self-pay | Admitting: Adult Health

## 2021-01-23 NOTE — Progress Notes (Signed)
  Chronic Care Management   Note  01/23/2021 Name: Jared [name] MRN: [id number] DOB: 07/29/1946  Jared [name] is a 75 y.o. year old male who is a primary care patient of Dorothyann Peng, NP. I reached out to [place] by phone today in response to a referral sent by [name]'s PCP, Dorothyann Peng, NP.   [name] was given information about Chronic Care Management services today including:  1. CCM service includes personalized support from designated clinical staff supervised by his physician, including individualized plan of care and coordination with other care providers 2. 24/7 contact phone numbers for assistance for urgent and routine care needs. 3. Service will only be billed when office clinical staff spend 20 minutes or more in a month to coordinate care. 4. Only one practitioner may furnish and bill the service in a calendar month. 5. The patient may stop CCM services at any time (effective at the end of the month) by phone call to the office staff.   Patient agreed to services and verbal consent obtained.   Follow up plan:   Carley Perdue [employer] Scheduler

## 2021-02-23 ENCOUNTER — Other Ambulatory Visit: Payer: Self-pay | Admitting: Adult Health

## 2021-02-26 ENCOUNTER — Other Ambulatory Visit: Payer: Self-pay | Admitting: Adult Health

## 2021-02-26 DIAGNOSIS — I10 Essential (primary) hypertension: Secondary | ICD-10-CM

## 2021-03-07 ENCOUNTER — Telehealth: Payer: Self-pay | Admitting: Adult Health

## 2021-03-07 NOTE — Telephone Encounter (Signed)
Pt call and stated he is having a gout attack and can't put any presser on it he stated he need something call in for the gout at  [id number] [place] [place] Phone:  [phone]  Fax:  [phone]

## 2021-03-08 ENCOUNTER — Other Ambulatory Visit: Payer: Self-pay | Admitting: Adult Health

## 2021-03-08 MED ORDER — PREDNISONE 20 MG PO TABS: 20.0000 mg | ORAL_TABLET | Freq: Every day | ORAL | 0 refills | Status: DC

## 2021-03-08 NOTE — Telephone Encounter (Signed)
Will send in prednisone. In the future he needs to follow up in the office for these types of issues

## 2021-03-08 NOTE — Telephone Encounter (Signed)
Patient informed that the Prednisone will be sent in and is aware that a follow up appointment should be made in the future for these issues.

## 2021-03-13 ENCOUNTER — Telehealth: Payer: Self-pay | Admitting: Pharmacist

## 2021-03-13 NOTE — Chronic Care Management (AMB) (Signed)
    Chronic Care Management Pharmacy Assistant   Name: Jared [name]  MRN: [id number] DOB: [birth year]  Reason for Encounter: Initial Questions for Pharmacist visit on 03-13-2021 Patient Questions:   1. Have you seen any other providers since your last visit? No 2. Any changes in your medications or health?  3. Any side effects from any medications? No 4. Do you have an symptoms or problems not managed by your medications? No 5. Any concerns about your health right now? No 6. Has your provider asked that you check blood pressure, blood sugar, or follow special diet at home? No 7. Do you get any type of exercise on a regular basis? No a. Part time job 8. Can you think of a goal you would like to reach for your health?  a. Go back to Gym  9. Do you have any problems getting your medications? No 10. Is there anything that you would like to discuss during the appointment? No  The patient was asked to please bring medications, blood pressure/ blood sugar log, and supplements to his appointment.        Medications: Outpatient Encounter Medications as of 03/13/2021  Medication Sig  . allopurinol (ZYLOPRIM) 100 MG tablet Take 1 tablet (100 mg total) by mouth 2 (two) times daily.  Marland Kitchen amLODipine (NORVASC) 10 MG tablet TAKE 1 TABLET BY MOUTH EVERY DAY  . aspirin EC 325 MG tablet Take 1 tablet (325 mg total) by mouth daily.  . hydrALAZINE (APRESOLINE) 25 MG tablet TAKE 1 TABLET BY MOUTH THREE TIMES A DAY  . labetalol (NORMODYNE) 200 MG tablet TAKE 2 TABLETS (400 MG TOTAL) BY MOUTH 2 (TWO) TIMES DAILY. **DUE FOR YEARLY PHYSICAL**  . lisinopril (ZESTRIL) 10 MG tablet Take 2 tablets (20 mg total) by mouth daily.  . metFORMIN (GLUCOPHAGE) 500 MG tablet TAKE 1 TABLET BY MOUTH TWICE A DAY WITH MEALS  . pantoprazole (PROTONIX) 40 MG tablet Take 1 tablet (40 mg total) by mouth 2 (two) times daily.  . predniSONE (DELTASONE) 20 MG tablet Take 1 tablet (20 mg total) by mouth daily with breakfast.  .  simvastatin (ZOCOR) 20 MG tablet TAKE 1 TABLET BY MOUTH EVERY DAY AT 6 PM   No facility-administered encounter medications on file as of 03/13/2021.    Star Rating Drugs:  Dispensed Quantity Pharmacy  L[facility]opril 10 mg [facility]22 [facility]  Simvastatin 20 mg [facility]31[facility]21 [facility]  Metformin 500 mg  [facility]08.[facility]22 [facility]    Amilia Revonda Standard, [place] Assistant [phone]

## 2021-03-14 ENCOUNTER — Other Ambulatory Visit: Payer: Self-pay

## 2021-03-14 ENCOUNTER — Ambulatory Visit (INDEPENDENT_AMBULATORY_CARE_PROVIDER_SITE_OTHER): Payer: Medicare Other | Admitting: Pharmacist

## 2021-03-14 ENCOUNTER — Other Ambulatory Visit: Payer: Self-pay | Admitting: Adult Health

## 2021-03-14 DIAGNOSIS — I10 Essential (primary) hypertension: Secondary | ICD-10-CM | POA: Diagnosis not present

## 2021-03-14 DIAGNOSIS — E782 Mixed hyperlipidemia: Secondary | ICD-10-CM

## 2021-03-14 DIAGNOSIS — E1121 Type 2 diabetes mellitus with diabetic nephropathy: Secondary | ICD-10-CM

## 2021-03-14 NOTE — Progress Notes (Signed)
Chronic Care Management Pharmacy Note  03/19/2021 Name:  Jared [name] MRN:  [id number] DOB:  1946-12-15  Subjective: Jared [name] is an 75 y.o. year old male who is a primary patient of Dorothyann Peng, NP.  The CCM team was consulted for assistance with disease management and care coordination needs.    Engaged with patient face to face for initial visit in response to provider referral for pharmacy case management and/or care coordination services.   Consent to Services:  The patient was given the following information about Chronic Care Management services today, agreed to services, and gave verbal consent: 1. CCM service includes personalized support from designated clinical staff supervised by the primary care provider, including individualized plan of care and coordination with other care providers 2. 24/7 contact phone numbers for assistance for urgent and routine care needs. 3. Service will only be billed when office clinical staff spend 20 minutes or more in a month to coordinate care. 4. Only one practitioner may furnish and bill the service in a calendar month. 5.The patient may stop CCM services at any time (effective at the end of the month) by phone call to the office staff. 6. The patient will be responsible for cost sharing (co-pay) of up to 20% of the service fee (after annual deductible is met). Patient agreed to services and consent obtained.  Patient Care Team: Dorothyann Peng, NP as PCP - General (Family Medicine) Lenis Dickinson, MD (Inactive) as Referring Physician (Neurology) Ralene Bathe, MD as Consulting Physician (Ophthalmology) Elmarie Shiley, MD as Consulting Physician (Nephrology) Viona Gilmore, [facility] as Pharmacist (Pharmacist)  Recent office visits: 03/07/21 Telephone note: Patient was having a gout attack and requested an rx. PCP sent in prednisone.  08/23/20 Dorothyann Peng, NP: Patient presented for annual exam. No medication changes made.  Recent consult  visits: 10/15/20 Elmarie Shiley, MD (nephrology): Patient presented for follow up. Unable to access notes.  03/21/20 Scarlette Shorts, MD (gastro): Patient presented for dysphagia and GERD follow up. No changes made. Continued pantoprazole 40 mg BID.  Hospital visits: None in previous 6 months  Objective:  Lab Results  Component Value Date   CREATININE 3.00 (H) 08/23/2020   BUN 29 (H) 08/23/2020   GFR 36.49 (L) 06/30/2019   GFRNONAA 20 (L) 08/23/2020   GFRAA 23 (L) 08/23/2020   NA 142 08/23/2020   K 4.0 08/23/2020   CALCIUM 9.1 08/23/2020   CO2 24 08/23/2020   GLUCOSE 74 08/23/2020    Lab Results  Component Value Date/Time   HGBA1C 5.7 (H) 08/23/2020 02:21 PM   HGBA1C 6.2 06/30/2019 10:29 AM   HGBA1C 5.8 02/22/2019 03:31 PM   HGBA1C 5.5 08/24/2018 02:43 PM   GFR 36.49 (L) 06/30/2019 10:29 AM   GFR 34.24 (L) 12/28/2018 01:57 PM   MICROALBUR 0.8 06/27/2014 04:05 PM    Last diabetic Eye exam:  Lab Results  Component Value Date/Time   HMDIABEYEEXA No Retinopathy 03/17/2018 12:00 AM    Last diabetic Foot exam: No results found for: HMDIABFOOTEX   Lab Results  Component Value Date   CHOL 154 08/23/2020   HDL 31 (L) 08/23/2020   LDLCALC 91 08/23/2020   LDLDIRECT 85.1 06/03/2013   TRIG 231 (H) 08/23/2020   CHOLHDL 5.0 (H) 08/23/2020    Hepatic Function Latest Ref Rng & Units 08/23/2020 06/30/2019 08/05/2017  Total Protein 6.1 - 8.1 g/dL 7.1 7.6 6.7  Albumin 3.5 - 5.2 g/dL - 4.6 4.5  AST 10 - 35 U/L 21  20 35  ALT 9 - 46 U/L 15 13 40  Alk Phosphatase 39 - 117 U/L - 95 54  Total Bilirubin 0.2 - 1.2 mg/dL 0.5 0.5 0.6  Bilirubin, Direct 0.0 - 0.3 mg/dL - - 0.1    Lab Results  Component Value Date/Time   TSH 0.96 08/23/2020 02:21 PM   TSH 0.91 06/30/2019 10:29 AM    CBC Latest Ref Rng & Units 08/23/2020 06/30/2019 12/16/2018  WBC 3.8 - 10.8 Thousand/uL 5.8 6.5 7.1  Hemoglobin 13.2 - 17.1 g/dL 12.7(L) 12.4(L) 10.6(L)  Hematocrit 38.5 - 50.0 % 36.8(L) 37.1(L) 32.7(L)  Platelets 140  - 400 Thousand/uL 229 243.0 246    Lab Results  Component Value Date/Time   VD25OH 29.5 10/05/2018 12:00 AM   VD25OH 27.4 09/13/2018 12:00 AM    Clinical ASCVD: Yes  The 10-year ASCVD risk score Mikey Bussing DC Jr., et al., 2013) is: 38.6%   Values used to calculate the score:     Age: 77 years     Sex: Male     Is Non-Hispanic African American: Yes     Diabetic: Yes     Tobacco smoker: No     Systolic Blood Pressure: 939 mmHg     Is BP treated: Yes     HDL Cholesterol: 31 mg/dL     Total Cholesterol: 154 mg/dL    Depression screen [facility] 2/9 08/23/2020 12/14/2018 10/23/2014  Decreased Interest 2 0 0  Down, Depressed, Hopeless 0 1 0  PHQ - 2 Score 2 1 0  Some recent data might be hidden      Social History   Tobacco Use  Smoking Status Never Smoker  Smokeless Tobacco Never Used   BP Readings from Last 3 Encounters:  08/23/20 132/88  03/21/20 (!) 156/94  01/31/20 124/75   Pulse Readings from Last 3 Encounters:  08/23/20 73  03/21/20 64  01/31/20 62   Wt Readings from Last 3 Encounters:  08/23/20 228 lb (103.4 kg)  03/21/20 228 lb (103.4 kg)  01/31/20 220 lb (99.8 kg)   BMI Readings from Last 3 Encounters:  08/23/20 34.67 kg/m  03/21/20 34.67 kg/m  01/31/20 32.96 kg/m    Assessment/Interventions: Review of patient past medical history, allergies, medications, health status, including review of consultants reports, laboratory and other test data, was performed as part of comprehensive evaluation and provision of chronic care management services.   SDOH:  (Social Determinants of Health) assessments and interventions performed: Yes  SDOH Interventions   Flowsheet Row Most Recent Value  SDOH Interventions   Financial Strain Interventions --  [working on patient assistance for Jardiance]  Transportation Interventions Intervention Not Indicated     SDOH Screenings   Alcohol Screen: Not on file  Depression (PHQ2-9): Low Risk   . PHQ-2 Score: 2  Financial Resource  Strain: Medium Risk  . Difficulty of Paying Living Expenses: Somewhat hard  Food Insecurity: Not on file  Housing: Not on file  Physical Activity: Not on file  Social Connections: Not on file  Stress: Not on file  Tobacco Use: Low Risk   . Smoking Tobacco Use: Never Smoker  . Smokeless Tobacco Use: Never Used  Transportation Needs: No Transportation Needs  . Lack of Transportation (Medical): No  . 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 He currently lives at home with his wife and they have 4 children. One daughter lives in the area  and works at [employer]. Another daughter is 45 years old and she lives in [place] and works in oncology.   Patient stays away from red meat due to gout flare up and is also avoiding pinto beans and hot dogs for the same reason. He does eat out for some meals. He tries to eat fish and salad for lunch and enjoys broccoli and carrots. He does eat desserts every now and then and drinks a lot of water. He also drinks coffee every morning and drinks a coke or sprite every now and then.  Patient wants to go back to the gym but hasn't been. He does some walking with delivering for his job but no structured exercise. He denies any trouble sleeping at night but does take 1.5 hour naps almost every day.  He denies any current issues with his medications other than Jardiance as he believes this may have caused his gout and stopped it after one day. He also reports the higher dose of lisinopril bothered him and that he needs to take his medications with food otherwise they hurt his stomach.  CCM Care Plan  Allergies  Allergen Reactions  . Strawberry Extract Shortness Of Breath    Medications Reviewed Today    Reviewed by Viona Gilmore, [facility] (Pharmacist) on 03/14/21 at 1500  Med List Status: <None>  Medication Order Taking? Sig Documenting Provider Last Dose Status  Informant  allopurinol (ZYLOPRIM) 100 MG tablet [id number] Yes Take 1 tablet (100 mg total) by mouth 2 (two) times daily. Dorothyann Peng, NP Taking Expired 11/21/20 2359   amLODipine (NORVASC) 10 MG tablet [id number] Yes TAKE 1 TABLET BY MOUTH EVERY DAY Nafziger, Tommi Rumps, NP Taking Active   aspirin EC 81 MG tablet [id number] Yes Take 81 mg by mouth daily. Swallow whole. [provider] Taking Active   isosorbide-hydrALAZINE (BIDIL) 20-37.5 MG tablet [id number] Yes Take 1 tablet by mouth 3 (three) times daily. [provider] Taking Active   labetalol (NORMODYNE) 200 MG tablet [id number] Yes TAKE 2 TABLETS (400 MG TOTAL) BY MOUTH 2 (TWO) TIMES DAILY. **DUE FOR YEARLY PHYSICAL** Nafziger, Tommi Rumps, NP Taking Active   lisinopril (ZESTRIL) 10 MG tablet [id number] Yes Take 2 tablets (20 mg total) by mouth daily. Nafziger, Tommi Rumps, NP Taking Active   metFORMIN (GLUCOPHAGE) 500 MG tablet [id number] Yes TAKE 1 TABLET BY MOUTH TWICE A DAY WITH MEALS  Patient taking differently: Take 500 mg by mouth daily with breakfast. TAKE 1 TABLET BY MOUTH TWICE A DAY WITH MEALS   Nafziger, Tommi Rumps, NP Taking Active   pantoprazole (PROTONIX) 40 MG tablet [id number] Yes Take 1 tablet (40 mg total) by mouth 2 (two) times daily. Irene Shipper, MD Taking Active   simvastatin (ZOCOR) 20 MG tablet [id number] Yes TAKE 1 TABLET BY MOUTH EVERY DAY AT 6 PM Nafziger, Tommi Rumps, NP Taking Active           Patient Active Problem List   Diagnosis Date Noted  . Stage 3b chronic kidney disease ([place]) 08/23/2020  . Hypertensive emergency 12/14/2018  . AKI (acute kidney injury) ([place])   . Hyperlipidemia 05/26/2015  . OSA on CPAP 11/13/2013  . CVA (cerebral infarction) 01/21/2013  . Diabetes mellitus ([place]) 01/21/2013  . Knee pain, bilateral 02/19/2011  . PLANTAR FASCIITIS, BILATERAL 04/19/2010  . UNSPECIFIED DISORDER OF LIPOID METABOLISM 11/08/2009  . 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  . PROSTATITIS, RECURRENT 11/08/2009   . URINARY FREQUENCY, CHRONIC 03/10/2008  . OSTEOARTHRITIS 09/28/2007  . GOUT 07/26/2007  .  Essential hypertension 07/26/2007  . Disorder resulting from impaired renal function 07/26/2007    Immunization History  Administered Date(s) Administered  . Fluad Quad(high Dose 65+) 09/16/2019, 09/16/2019, 10/20/2020  . Influenza Whole 12/22/2005, 10/02/2008, 11/08/2009  . Influenza, High Dose Seasonal PF 01/25/2018, 12/01/2018  . Influenza,inj,Quad PF,6+ Mos 09/28/2013  . Moderna Sars-Covid-2 Vaccination 02/03/2020, 03/02/2020  . PFIZER(Purple Top)SARS-COV-2 Vaccination 12/06/2020  . Pneumococcal Conjugate-13 10/23/2014  . Pneumococcal Polysaccharide-23 06/02/2016  . Td 12/22/2005    Conditions to be addressed/monitored:  Hypertension, Hyperlipidemia, Diabetes, GERD, Chronic Kidney Disease, Osteoarthritis and Gout  Care Plan : [place]  Updates made by Viona Gilmore, [place] since 03/19/2021 12:00 AM    Problem: Problem: Hypertension, Hyperlipidemia, Diabetes, GERD, Chronic Kidney Disease, Osteoarthritis and Gout     Long-Range Goal: Patient-Specific Goal   Start Date: 03/14/2021  Expected End Date: 03/14/2022  This Visit's Progress: On track  Priority: High  Note:   Current Barriers:  . Unable to independently monitor therapeutic efficacy . Unable to achieve control of cholesterol  . Suboptimal therapeutic regimen for cholesterol   Pharmacist Clinical Goal(s):  Marland Kitchen Patient will verbalize ability to afford treatment regimen . achieve adherence to monitoring guidelines and medication adherence to achieve therapeutic efficacy . achieve control of cholesterol as evidenced by next lipid panel  through collaboration with PharmD and provider.   Interventions: . 1:1 collaboration with Dorothyann Peng, NP regarding development and update of comprehensive plan of care as evidenced by provider attestation and co-signature . Inter-disciplinary care team collaboration (see  longitudinal plan of care) . Comprehensive medication review performed; medication list updated in electronic medical record  Hypertension (BP goal <130/80) -Not ideally controlled -Current treatment: . Lisinopril 10 mg 1 tablet twice daily . Labetalol 200 mg 2 tablets twice daily . Isosorbide-hydralazine 20-37.5 mg 1 tablet three times daily . Amlodipine 10 mg 1 tablet daily -Medications previously tried: n/a  -Current home readings: does not check at home -Current dietary habits: doesn't add salt but eats out some; has some bacon and ham occasionally -Current exercise habits: no structured exercise -Denies hypotensive/hypertensive symptoms -Educated on Daily salt intake goal < 2300 mg; Exercise goal of 150 minutes per week; Importance of home blood pressure monitoring; Proper BP monitoring technique; -Counseled to monitor BP at home weekly, document, and provide log at future appointments -Counseled on diet and exercise extensively Recommended to continue current medication Assessed patient finances. Recommend switching Bidil to 2 separate medications due to cost  Hyperlipidemia: (LDL goal < 70) -Uncontrolled -Current treatment: . Simvastatin 20 mg 1 tablet daily at 6 pm -Medications previously tried: none -Current dietary patterns: eats vegetables 2-3 times a week and a bananas every day but has cut out junk food; does admit to eating fried foods -Current exercise habits: no structured exercise -Educated on Cholesterol goals;  Benefits of statin for ASCVD risk reduction; Importance of limiting foods high in cholesterol; Exercise goal of 150 minutes per week; -Recommended increase statin intensity   Diabetes (A1c goal <7%) -Controlled -Current medications: Marland Kitchen Metformin 500 mg 1 tablet once daily with breakfast . Jardiance 10 mg 1 tablet daily - not currently taking -Medications previously tried: [place] (cost)  -Current home glucose readings - does not have a meter at  home . fasting glucose: n/a . post prandial glucose: n/a -Denies hypoglycemic/hyperglycemic symptoms -Current meal patterns:  . breakfast: n/a  . lunch: n/a  . dinner: n/a . snacks: n/a . drinks: n/a -Current exercise: no structured exercise -Educated on  A1c and blood sugar goals; Complications of diabetes including kidney damage, retinal damage, and cardiovascular disease; Exercise goal of 150 minutes per week; Prevention and management of hypoglycemic episodes; Carbohydrate counting and/or plate method -Counseled to check feet daily and get yearly eye exams -Counseled on diet and exercise extensively Collaborated with PCP on stopping metformin and taking Jardiance only   History of stroke (Goal: prevent future strokes) -Controlled -Current treatment  . Simvastatin 20 mg 1 tablet daily at bedtime . Aspirin 81 mg 1 tablet daily -Medications previously tried: none  -Recommended to continue current medication Counseled on monitoring for bleeding with use of aspirin  Gout (Goal: uric acid < 6 and prevent flare ups) -Uncontrolled -Current treatment  . Allopurinol 100 mg 1 tablet twice daily -Medications previously tried: prednisone (flare up), colchicine (rx ran out)  -Recommended to continue current medication Counseled on low purine diet foods and drinks  CKD (Goal: protect kidneys) -Uncontrolled -Current treatment  . Jardiance 10 mg daily - not taking -Medications previously tried: [place] (cost)  -Recommended to continue current medication Counseled on side effects and patient assistance opportunities  Dysphagia (Goal: minimize symptoms) -Controlled -Current treatment  . pantoprazole 40 mg 1 tablet twice daily -Medications previously tried: none  -Counseled on non-pharmacologic management of symptoms such as elevating the head of your bed, avoiding eating 2-3 hours before bed, avoiding triggering foods such as acidic, spicy, or fatty foods, eating smaller meals, and  wearing clothes that are loose around the waist    Health Maintenance -Vaccine gaps: tetanus, shingles -Current therapy:  . Multivitamin 1 tablet daily -Educated on Cost vs benefit of each product must be carefully weighed by individual consumer -Patient is satisfied with current therapy and denies issues -Recommended to continue current medication  Patient Goals/Self-Care Activities . Patient will:  - take medications as prescribed check glucose as needed, document, and provide at future appointments check blood pressure weekly, document, and provide at future appointments target a minimum of 150 minutes of moderate intensity exercise weekly  Follow Up Plan: Face to Face appointment with care management team member scheduled for:  4 months      Medication Assistance: Will work on [employer] patient assistance.  Patient's preferred pharmacy is:  [facility], [address], [address], [place] [address] Phone: [phone] Fax: [phone]  [facility], [address] [place] [address] Phone: [phone] Fax: [phone]  [id number] [id number]: [phone]: [phone] Uses pill box? No - lines up medications Pt endorses 99% compliance (misses once a month)  We discussed: Benefits of medication synchronization, packaging and delivery as well as enhanced pharmacist oversight with [facility]. Patient decided to: Utilize [facility] pharmacy for medication synchronization, packaging and delivery  Care Plan and Follow Up Patient Decision:  Patient agrees to Care Plan and Follow-up.  Plan: Face to Face appointment with care management team member scheduled for: 4 months  MJeni Salles  PharmD BBeaconPharmacist [phone]

## 2021-03-19 ENCOUNTER — Other Ambulatory Visit: Payer: Self-pay | Admitting: Adult Health

## 2021-03-19 MED ORDER — HYDRALAZINE HCL 25 MG PO TABS
25.0000 mg | ORAL_TABLET | Freq: Three times a day (TID) | ORAL | 3 refills | Status: DC
Start: 2021-03-19 — End: 2022-12-02

## 2021-03-19 MED ORDER — BLOOD GLUCOSE MONITOR KIT: PACK | 0 refills | Status: AC

## 2021-03-19 MED ORDER — COLCHICINE 0.6 MG PO TABS: 0.6000 mg | ORAL_TABLET | Freq: Every day | ORAL | 0 refills | Status: DC

## 2021-03-19 MED ORDER — ISOSORBIDE DINITRATE 20 MG PO TABS: 20.0000 mg | ORAL_TABLET | Freq: Three times a day (TID) | ORAL | 3 refills | Status: DC

## 2021-03-19 NOTE — Patient Instructions (Addendum)
Hi Jared [name],  It was great to get to meet you in person! Below is a summary of some of the topics we discussed.  As a reminder, I think it would be very beneficial for you to check your blood pressure at home while taking blood pressure medications and to have a blood sugar machine to be able to check if you need to.  Please reach out to me if you have any questions or need anything before our follow up!  Best, Maddie  [name], PharmD, [facility] [phone]  Visit Information  Goals Addressed            This Visit's Progress   . Manage My Medicine       Timeframe:  Long-Range Goal Priority:  High Start Date:                             Expected End Date:                       Follow Up Date 03/04/22    - call for medicine refill 2 or 3 days before it runs out - keep a list of all the medicines I take; vitamins and herbals too - use a pillbox to sort medicine    Why is this important?   . These steps will help you keep on track with your medicines.   Notes:       Patient Care Plan: CCM Pharmacy Care Plan    Problem Identified: Problem: Hypertension, Hyperlipidemia, Diabetes, GERD, Chronic Kidney Disease, Osteoarthritis and Gout     Long-Range Goal: Patient-Specific Goal   Start Date: 03/14/2021  Expected End Date: 03/14/2022  This Visit's Progress: On track  Priority: High  Note:   Current Barriers:  . Unable to independently monitor therapeutic efficacy . Unable to achieve control of cholesterol  . Suboptimal therapeutic regimen for cholesterol   Pharmacist Clinical Goal(s):  Marland Kitchen Patient will verbalize ability to afford treatment regimen . achieve adherence to monitoring guidelines and medication adherence to achieve therapeutic efficacy . achieve control of cholesterol as evidenced by next lipid panel  through collaboration with PharmD and provider.   Interventions: . 1:1 collaboration with Dorothyann Peng,  NP regarding development and update of comprehensive plan of care as evidenced by provider attestation and co-signature . Inter-disciplinary care team collaboration (see longitudinal plan of care) . Comprehensive medication review performed; medication list updated in electronic medical record  Hypertension (BP goal <130/80) -Not ideally controlled -Current treatment: . Lisinopril 10 mg 1 tablet twice daily . Labetalol 200 mg 2 tablets twice daily . Isosorbide-hydralazine 20-37.5 mg 1 tablet three times daily . Amlodipine 10 mg 1 tablet daily -Medications previously tried: n/a  -Current home readings: does not check at home -Current dietary habits: doesn't add salt but eats out some; has some bacon and ham occasionally -Current exercise habits: no structured exercise -Denies hypotensive/hypertensive symptoms -Educated on Daily salt intake goal < 2300 mg; Exercise goal of 150 minutes per week; Importance of home blood pressure monitoring; Proper BP monitoring technique; -Counseled to monitor BP at home weekly, document, and provide log at future appointments -Counseled on diet and exercise extensively Recommended to continue current medication Assessed patient finances. Recommend switching Bidil to 2 separate medications due to cost  Hyperlipidemia: (LDL goal < 70) -Uncontrolled -Current treatment: . Simvastatin 20 mg 1 tablet daily at 6 pm -Medications  previously tried: none -Current dietary patterns: eats vegetables 2-3 times a week and a bananas every day but has cut out junk food; does admit to eating fried foods -Current exercise habits: no structured exercise -Educated on Cholesterol goals;  Benefits of statin for ASCVD risk reduction; Importance of limiting foods high in cholesterol; Exercise goal of 150 minutes per week; -Recommended increase statin intensity   Diabetes (A1c goal <7%) -Controlled -Current medications: Marland Kitchen Metformin 500 mg 1 tablet once daily with  breakfast . Jardiance 10 mg 1 tablet daily - not currently taking -Medications previously tried: [place] (cost)  -Current home glucose readings - does not have a meter at home . fasting glucose: n/a . post prandial glucose: n/a -Denies hypoglycemic/hyperglycemic symptoms -Current meal patterns:  . breakfast: n/a  . lunch: n/a  . dinner: n/a . snacks: n/a . drinks: n/a -Current exercise: no structured exercise -Educated on A1c and blood sugar goals; Complications of diabetes including kidney damage, retinal damage, and cardiovascular disease; Exercise goal of 150 minutes per week; Prevention and management of hypoglycemic episodes; Carbohydrate counting and/or plate method -Counseled to check feet daily and get yearly eye exams -Counseled on diet and exercise extensively Collaborated with PCP on stopping metformin and taking Jardiance only   History of stroke (Goal: prevent future strokes) -Controlled -Current treatment  . Simvastatin 20 mg 1 tablet daily at bedtime . Aspirin 81 mg 1 tablet daily -Medications previously tried: none  -Recommended to continue current medication Counseled on monitoring for bleeding with use of aspirin  Gout (Goal: uric acid < 6 and prevent flare ups) -Uncontrolled -Current treatment  . Allopurinol 100 mg 1 tablet twice daily -Medications previously tried: prednisone (flare up), colchicine (rx ran out)  -Recommended to continue current medication Counseled on low purine diet foods and drinks  CKD (Goal: protect kidneys) -Uncontrolled -Current treatment  . Jardiance 10 mg daily - not taking -Medications previously tried: [place] (cost)  -Recommended to continue current medication Counseled on side effects and patient assistance opportunities  Dysphagia (Goal: minimize symptoms) -Controlled -Current treatment  . pantoprazole 40 mg 1 tablet twice daily -Medications previously tried: none  -Counseled on non-pharmacologic management of  symptoms such as elevating the head of your bed, avoiding eating 2-3 hours before bed, avoiding triggering foods such as acidic, spicy, or fatty foods, eating smaller meals, and wearing clothes that are loose around the waist    Health Maintenance -Vaccine gaps: tetanus, shingles -Current therapy:  . Multivitamin 1 tablet daily -Educated on Cost vs benefit of each product must be carefully weighed by individual consumer -Patient is satisfied with current therapy and denies issues -Recommended to continue current medication  Patient Goals/Self-Care Activities . Patient will:  - take medications as prescribed check glucose as needed, document, and provide at future appointments check blood pressure weekly, document, and provide at future appointments target a minimum of 150 minutes of moderate intensity exercise weekly  Follow Up Plan: Face to Face appointment with care management team member scheduled for:  4 months     Jared [name] was given information about Chronic Care Management services today including:  1. CCM service includes personalized support from designated clinical staff supervised by his physician, including individualized plan of care and coordination with other care providers 2. 24/7 contact phone numbers for assistance for urgent and routine care needs. 3. Standard insurance, coinsurance, copays and deductibles apply for chronic care management only during months in which we provide at least 20 minutes of these services. Most  insurances cover these services at 100%, however patients may be responsible for any copay, coinsurance and/or deductible if applicable. This service may help you avoid the need for more expensive face-to-face services. 4. Only one practitioner may furnish and bill the service in a calendar month. 5. The patient may stop CCM services at any time (effective at the end of the month) by phone call to the office staff.  Patient agreed to services and  verbal consent obtained.   The patient verbalized understanding of instructions, educational materials, and care plan provided today and declined offer to receive copy of patient instructions, educational materials, and care plan.  Face to Face appointment with pharmacist scheduled for:  4 months  Viona Gilmore, [facility]  How to Take Your Blood Pressure Blood pressure is a measurement of how strongly your blood is pressing against the walls of your arteries. Arteries are blood vessels that carry blood from your heart throughout your body. Your health care provider takes your blood pressure at each office visit. You can also take your own blood pressure at home with a blood pressure monitor. You may need to take your own blood pressure to:  Confirm a diagnosis of high blood pressure (hypertension).  Monitor your blood pressure over time.  Make sure your blood pressure medicine is working. Supplies needed:  Blood pressure monitor.  Dining room chair to sit in.  Table or desk.  Small notebook and pencil or pen. How to prepare To get the most accurate reading, avoid the following for 30 minutes before you check your blood pressure:  Drinking caffeine.  Drinking alcohol.  Eating.  Smoking.  Exercising. Five minutes before you check your blood pressure:  Use the bathroom and urinate so that you have an empty bladder.  Sit quietly in a dining room chair. Do not sit in a soft couch or an armchair. Do not talk. How to take your blood pressure To check your blood pressure, follow the instructions in the manual that came with your blood pressure monitor. If you have a digital blood pressure monitor, the instructions may be as follows: 1. Sit up straight in a chair. 2. Place your feet on the floor. Do not cross your ankles or legs. 3. Rest your left arm at the level of your heart on a table or desk or on the arm of a chair. 4. Pull up your shirt sleeve. 5. Wrap the blood pressure  cuff around the upper part of your left arm, 1 inch (2.5 cm) above your elbow. It is best to wrap the cuff around bare skin. 6. Fit the cuff snugly around your arm. You should be able to place only one finger between the cuff and your arm. 7. Position the cord so that it rests in the bend of your elbow. 8. Press the power button. 9. Sit quietly while the cuff inflates and deflates. 10. Read the digital reading on the monitor screen and write the numbers down (record them) in a notebook. 11. Wait 2-3 minutes, then repeat the steps, starting at step 1.   What does my blood pressure reading mean? A blood pressure reading consists of a higher number over a lower number. Ideally, your blood pressure should be below 120/80. The first ("top") number is called the systolic pressure. It is a measure of the pressure in your arteries as your heart beats. The second ("bottom") number is called the diastolic pressure. It is a measure of the pressure in your arteries as  the heart relaxes. Blood pressure is classified into five stages. The following are the stages for adults who do not have a short-term serious illness or a chronic condition. Systolic pressure and diastolic pressure are measured in a unit called mm Hg (millimeters of mercury).  Normal  Systolic pressure: below 389.  Diastolic pressure: below 80. Elevated  Systolic pressure: 373-428.  Diastolic pressure: below 80. Hypertension stage 1  Systolic pressure: 768-115.  Diastolic pressure: 72-62. Hypertension stage 2  Systolic pressure: 035 or above.  Diastolic pressure: 90 or above. You can have elevated blood pressure or hypertension even if only the systolic or only the diastolic number in your reading is higher than normal. Follow these instructions at home:  Check your blood pressure as often as recommended by your health care provider.  Check your blood pressure at the same time every day.  Take your monitor to the next  appointment with your health care provider to make sure that: ? You are using it correctly. ? It provides accurate readings.  Be sure you understand what your goal blood pressure numbers are.  Tell your health care provider if you are having any side effects from blood pressure medicine.  Keep all follow-up visits as told by your health care provider. This is important. General tips  Your health care provider can suggest a reliable monitor that will meet your needs. There are several types of home blood pressure monitors.  Choose a monitor that has an arm cuff. Do not choose a monitor that measures your blood pressure from your wrist or finger.  Choose a cuff that wraps snugly around your upper arm. You should be able to fit only one finger between your arm and the cuff.  You can buy a blood pressure monitor at most drugstores or online. Where to find more information American Heart Association: www.heart.org Contact a health care provider if:  Your blood pressure is consistently high. Get help right away if:  Your systolic blood pressure is higher than 180.  Your diastolic blood pressure is higher than 120. Summary  Blood pressure is a measurement of how strongly your blood is pressing against the walls of your arteries.  A blood pressure reading consists of a higher number over a lower number. Ideally, your blood pressure should be below 120/80.  Check your blood pressure at the same time every day.  Avoid caffeine, alcohol, smoking, and exercise for 30 minutes prior to checking your blood pressure. These agents can affect the accuracy of the blood pressure reading. This information is not intended to replace advice given to you by your health care provider. Make sure you discuss any questions you have with your health care provider. Document Revised: 12/02/2019 Document Reviewed: 12/02/2019 Elsevier Patient Education  2021 [employer].

## 2021-03-22 ENCOUNTER — Telehealth: Payer: Self-pay | Admitting: Pharmacist

## 2021-03-22 ENCOUNTER — Other Ambulatory Visit: Payer: Self-pay

## 2021-03-22 DIAGNOSIS — I10 Essential (primary) hypertension: Secondary | ICD-10-CM

## 2021-03-22 DIAGNOSIS — K2970 Gastritis, unspecified, without bleeding: Secondary | ICD-10-CM

## 2021-03-22 DIAGNOSIS — M1A00X1 Idiopathic chronic gout, unspecified site, with tophus (tophi): Secondary | ICD-10-CM

## 2021-03-22 DIAGNOSIS — K2101 Gastro-esophageal reflux disease with esophagitis, with bleeding: Secondary | ICD-10-CM

## 2021-03-22 DIAGNOSIS — K219 Gastro-esophageal reflux disease without esophagitis: Secondary | ICD-10-CM

## 2021-03-22 DIAGNOSIS — K222 Esophageal obstruction: Secondary | ICD-10-CM

## 2021-03-22 MED ORDER — LISINOPRIL 10 MG PO TABS
20.0000 mg | ORAL_TABLET | Freq: Every day | ORAL | 3 refills | Status: DC
Start: 2021-03-22 — End: 2021-06-04

## 2021-03-22 MED ORDER — AMLODIPINE BESYLATE 10 MG PO TABS: 10.0000 mg | ORAL_TABLET | Freq: Every day | ORAL | 1 refills | Status: DC

## 2021-03-22 MED ORDER — PANTOPRAZOLE SODIUM 40 MG PO TBEC: 40.0000 mg | DELAYED_RELEASE_TABLET | Freq: Two times a day (BID) | ORAL | 3 refills | Status: DC

## 2021-03-22 MED ORDER — ALLOPURINOL 100 MG PO TABS: 100.0000 mg | ORAL_TABLET | Freq: Two times a day (BID) | ORAL | 3 refills | Status: DC

## 2021-03-22 MED ORDER — LABETALOL HCL 200 MG PO TABS: 400.0000 mg | ORAL_TABLET | Freq: Two times a day (BID) | ORAL | 5 refills | Status: DC

## 2021-03-22 MED ORDER — SIMVASTATIN 20 MG PO TABS: ORAL_TABLET | ORAL | 3 refills | Status: DC

## 2021-03-22 NOTE — Telephone Encounter (Signed)
Called patient about recommendations from PCP after CCM appointment.   Patient is aware to stop metformin and restart taking the Jardiance daily. Plan to follow up with patient in a few weeks to see how he is tolerating.  Follow up PCP appointment scheduled in 3 months for repeat A1c.

## 2021-03-23 ENCOUNTER — Other Ambulatory Visit: Payer: Self-pay | Admitting: Adult Health

## 2021-03-23 DIAGNOSIS — I10 Essential (primary) hypertension: Secondary | ICD-10-CM

## 2021-03-27 ENCOUNTER — Other Ambulatory Visit: Payer: Self-pay | Admitting: Internal Medicine

## 2021-03-27 DIAGNOSIS — K219 Gastro-esophageal reflux disease without esophagitis: Secondary | ICD-10-CM

## 2021-03-27 DIAGNOSIS — K2970 Gastritis, unspecified, without bleeding: Secondary | ICD-10-CM

## 2021-03-27 DIAGNOSIS — K2101 Gastro-esophageal reflux disease with esophagitis, with bleeding: Secondary | ICD-10-CM

## 2021-03-27 DIAGNOSIS — K222 Esophageal obstruction: Secondary | ICD-10-CM

## 2021-03-28 ENCOUNTER — Other Ambulatory Visit: Payer: Self-pay | Admitting: Adult Health

## 2021-03-28 DIAGNOSIS — I10 Essential (primary) hypertension: Secondary | ICD-10-CM

## 2021-04-08 ENCOUNTER — Telehealth: Payer: Self-pay | Admitting: Pharmacist

## 2021-04-08 NOTE — Telephone Encounter (Signed)
Spoke with patient to confirm that he is still taking Jardiance. Patient reports that he is taking the Jardiance and stopped taking the metformin per previous discussion with pharmacist.  Requested a new prescription of Jardiance to be sent to [facility] to be filled alongside his other medications per patient request.

## 2021-04-09 ENCOUNTER — Other Ambulatory Visit: Payer: Self-pay | Admitting: Adult Health

## 2021-04-09 MED ORDER — ISOSORBIDE MONONITRATE ER 30 MG PO TB24: 30.0000 mg | ORAL_TABLET | Freq: Every day | ORAL | 3 refills | Status: DC

## 2021-04-10 ENCOUNTER — Other Ambulatory Visit: Payer: Self-pay | Admitting: Internal Medicine

## 2021-04-10 DIAGNOSIS — K222 Esophageal obstruction: Secondary | ICD-10-CM

## 2021-04-10 DIAGNOSIS — K2990 Gastroduodenitis, unspecified, without bleeding: Secondary | ICD-10-CM

## 2021-04-10 DIAGNOSIS — K2970 Gastritis, unspecified, without bleeding: Secondary | ICD-10-CM

## 2021-04-10 DIAGNOSIS — K2101 Gastro-esophageal reflux disease with esophagitis, with bleeding: Secondary | ICD-10-CM

## 2021-04-10 DIAGNOSIS — K219 Gastro-esophageal reflux disease without esophagitis: Secondary | ICD-10-CM

## 2021-04-11 ENCOUNTER — Ambulatory Visit (INDEPENDENT_AMBULATORY_CARE_PROVIDER_SITE_OTHER): Payer: Medicare Other | Admitting: Pharmacist

## 2021-04-11 ENCOUNTER — Telehealth: Payer: Self-pay | Admitting: *Deleted

## 2021-04-11 ENCOUNTER — Other Ambulatory Visit: Payer: Self-pay

## 2021-04-11 DIAGNOSIS — E1121 Type 2 diabetes mellitus with diabetic nephropathy: Secondary | ICD-10-CM | POA: Diagnosis not present

## 2021-04-11 DIAGNOSIS — N1832 Chronic kidney disease, stage 3b: Secondary | ICD-10-CM

## 2021-04-11 MED ORDER — EMPAGLIFLOZIN 10 MG PO TABS: 10.0000 mg | ORAL_TABLET | Freq: Every day | ORAL | 0 refills | Status: DC

## 2021-04-11 NOTE — Telephone Encounter (Signed)
-----   Message from Viona Gilmore, [facility] sent at 04/11/2021  4:01 PM EDT ----- Regarding: Jardiance 10 mg sample Hi,  Can you put in a sample for Mr. Weddington? I gave him 2 boxes (14 tabs total) of Jardiance 10 mg with lot: [id number] and exp: Dec 2023.  Thank you! Maddie

## 2021-04-18 NOTE — Progress Notes (Signed)
Patient has [employer] and reports copay for Vania Rea is cost prohibitive at this time.  Reviewed application process for [employer] patient assistance program. Patient meets income/out of pocket spend criteria for the program. Patient will provide proof of income, out of pocket spend report, and will sign application. Will collaborate with prescriber Dr. Posey Pronto for the provider portion of application. Once completed, application will be submitted via Fax   Provided 2 weeks worth of samples for the patient while he tries to get all of his medications aligned with [facility].  Viona Gilmore, [facility]

## 2021-04-25 ENCOUNTER — Telehealth: Payer: Self-pay | Admitting: Pharmacist

## 2021-04-25 NOTE — Chronic Care Management (AMB) (Signed)
04/25/2021- Called [facility] regarding follow up on Jardiance medication in which patient needs refills of. Spoke with Jon Gills at [facility], they have down they have tried to deliver medications to patient a few times but unable to get in touch with patient.  Patient followed up with [facility]PP requesting medication delivery for tomorrow. Pharmacy technician Silvano Bilis was able to get in touch with patient and will send a 89 DS to sync up to his other meds and his copay was $94 and he [facility]. [name] will contact patient to give 6-7 day supply samples of Jardiance to help with cost. [facility] will send Jardiance out 30 day supply 04/26/2021 and patient is aware.   Pattricia Boss, [place]

## 2021-05-07 ENCOUNTER — Telehealth: Payer: Self-pay | Admitting: Adult Health

## 2021-05-07 NOTE — Telephone Encounter (Signed)
Tried calling patient to  schedule Medicare Annual Wellness Visit (AWV) either virtually or in office.   AWV-I per [place] 09/21/14  please schedule at anytime with [facility] Nurse Health Advisor 1 or 2   This should be a 45 minute visit.

## 2021-05-08 ENCOUNTER — Telehealth: Payer: Self-pay | Admitting: Pharmacist

## 2021-05-08 NOTE — Chronic Care Management (AMB) (Signed)
Error

## 2021-05-08 NOTE — Chronic Care Management (AMB) (Signed)
Chronic Care Management Pharmacy Assistant   Name: Jared [name]  MRN: [id number] DOB: 1946-03-22   Reason for Encounter: Disease State/ General Assessment Call.    Conditions to be addressed/monitored: HTN, HLD, DMII and CKD Stage stage 3.   Recent office visits:  None.  Recent consult visits:  None.   Hospital visits:  None in previous 6 months  Medications: Outpatient Encounter Medications as of 05/08/2021  Medication Sig  . allopurinol (ZYLOPRIM) 100 MG tablet Take 1 tablet (100 mg total) by mouth 2 (two) times daily.  Marland Kitchen amLODipine (NORVASC) 10 MG tablet Take 1 tablet (10 mg total) by mouth daily.  Marland Kitchen aspirin EC 81 MG tablet Take 81 mg by mouth daily. Swallow whole.  . blood glucose meter kit and supplies KIT Dispense based on patient and insurance preference. Use up to three times daily as directed.  . colchicine 0.6 MG tablet Take 1 tablet (0.6 mg total) by mouth daily.  . empagliflozin (JARDIANCE) 10 MG TABS tablet Take 1 tablet (10 mg total) by mouth daily before breakfast.  . hydrALAZINE (APRESOLINE) 25 MG tablet Take 1 tablet (25 mg total) by mouth 3 (three) times daily.  . isosorbide mononitrate (IMDUR) 30 MG 24 hr tablet Take 1 tablet (30 mg total) by mouth daily.  Marland Kitchen labetalol (NORMODYNE) 200 MG tablet TAKE 2 TABLETS (400 MG TOTAL) BY MOUTH 2 (TWO) TIMES DAILY. **DUE FOR YEARLY PHYSICAL**  . lisinopril (ZESTRIL) 10 MG tablet Take 2 tablets (20 mg total) by mouth daily.  . pantoprazole (PROTONIX) 40 MG tablet TAKE 1 TABLET BY MOUTH TWICE A DAY  . simvastatin (ZOCOR) 20 MG tablet TAKE 1 TABLET BY MOUTH EVERY DAY AT 6 PM   No facility-administered encounter medications on file as of 05/08/2021.    Reviewed chart prior to disease state call. Spoke with patient regarding BP  Recent Office Vitals: BP Readings from Last 3 Encounters:  08/23/20 132/88  03/21/20 (!) 156/94  01/31/20 124/75   Pulse Readings from Last 3 Encounters:  08/23/20 73  03/21/20 64   01/31/20 62    Wt Readings from Last 3 Encounters:  08/23/20 228 lb (103.4 kg)  03/21/20 228 lb (103.4 kg)  01/31/20 220 lb (99.8 kg)     Kidney Function Lab Results  Component Value Date/Time   CREATININE 3.00 (H) 08/23/2020 02:21 PM   CREATININE 2.17 (H) 06/30/2019 10:29 AM   CREATININE 2.42 (H) 12/28/2018 01:57 PM   CREATININE 2.11 (H) 05/24/2015 11:38 AM   GFR 36.49 (L) 06/30/2019 10:29 AM   GFRNONAA 20 (L) 08/23/2020 02:21 PM   GFRAA 23 (L) 08/23/2020 02:21 PM    BMP Latest Ref Rng & Units 08/23/2020 06/30/2019 12/28/2018  Glucose 65 - 99 mg/dL 74 107(H) 95  BUN 7 - 25 mg/dL 29(H) 23 28(H)  Creatinine 0.70 - 1.18 mg/dL 3.00(H) 2.17(H) 2.42(H)  BUN/Creat Ratio 6 - 22 (calc) 10 - -  Sodium 135 - 146 mmol/L 142 139 140  Potassium 3.5 - 5.3 mmol/L 4.0 4.7 4.8  Chloride 98 - 110 mmol/L 108 105 106  CO2 20 - 32 mmol/L _0 Calcium 8.6 - 10.3 mg/dL 9.1 9.3 9.6    . Current antihypertensive regimen:   Lisinopril 10 mg 1 tablet twice daily  Labetalol 200 mg 2 tablets twice daily  Isosorbide 75m 1 times daily  Amlodipine 10 mg 1 tablet daily  Hydralazine 230m1 tablet daily  . How often are you checking your Blood Pressure?   .Marland Kitchen  Current home BP readings:   . What recent interventions/DTPs have been made by any provider to improve Blood Pressure control since last CPP Visit: None.   . Any recent hospitalizations or ED visits since last visit with CPP? No   . What diet changes have been made to improve Blood Pressure Control?  o   . What exercise is being done to improve your Blood Pressure Control?  o   Adherence Review: Is the patient currently on ACE/ARB medication? Yes Does the patient have >5 day gap between last estimated fill dates? Yes   Comprehensive medication review performed; Spoke to patient regarding cholesterol  Lipid Panel    Component Value Date/Time   CHOL 154 08/23/2020 1421   CHOL 121 10/17/2013 1013   TRIG 231 (H) 08/23/2020 1421    TRIG 128 10/17/2013 1013   HDL 31 (L) 08/23/2020 1421   HDL 34 (L) 10/17/2013 1013   LDLCALC 91 08/23/2020 1421   LDLCALC 61 10/17/2013 1013   LDLDIRECT 85.1 06/03/2013 1212    10-year ASCVD risk score: The 10-year ASCVD risk score Mikey Bussing DC Brooke Bonito., et al., 2013) is: 38.6%   Values used to calculate the score:     Age: 5 years     Sex: Male     Is Non-Hispanic African American: Yes     Diabetic: Yes     Tobacco smoker: No     Systolic Blood Pressure: 301 mmHg     Is BP treated: Yes     HDL Cholesterol: 31 mg/dL     Total Cholesterol: 154 mg/dL  . Current antihyperlipidemic regimen:   Simvastatin 20 mg 1 tablet daily at 6 pm  . Previous antihyperlipidemic medications tried: None.   . ASCVD risk enhancing conditions: age >6, DM, HTN and CKD  . What recent interventions/DTPs have been made by any provider to improve Cholesterol control since last CPP Visit: None.   . Any recent hospitalizations or ED visits since last visit with CPP? No  . What diet changes have been made to improve Cholesterol?  o   . What exercise is being done to improve Cholesterol?  o   Adherence Review: Does the patient have >5 day gap between last estimated fill dates? Yes  Multiple unsuccessful attempts to reach patient by phone.   Star Rating Drugs:   Empagliflozin 10 mg - [facility]  L[facility]opril 58m - [facility]  [facility]vastatin 218m- [phone]

## 2021-05-10 NOTE — Telephone Encounter (Cosign Needed)
2 more attempts at calling both numbers.

## 2021-05-23 ENCOUNTER — Telehealth: Payer: Self-pay | Admitting: Pharmacist

## 2021-05-23 NOTE — Chronic Care Management (AMB) (Addendum)
    Chronic Care Management Pharmacy Assistant   Name: RUBIN DAIS  MRN: [id number] DOB: 04-04-1946  Reason for Encounter: Medication Review/ Medication Coordination Call.    Recent office visits:  None  Recent consult visits:  None  Hospital visits:  None in last 6 months  Medications: Outpatient Encounter Medications as of 05/23/2021  Medication Sig   allopurinol (ZYLOPRIM) 100 MG tablet Take 1 tablet (100 mg total) by mouth 2 (two) times daily.   amLODipine (NORVASC) 10 MG tablet Take 1 tablet (10 mg total) by mouth daily.   aspirin EC 81 MG tablet Take 81 mg by mouth daily. Swallow whole.   blood glucose meter kit and supplies KIT Dispense based on patient and insurance preference. Use up to three times daily as directed.   colchicine 0.6 MG tablet Take 1 tablet (0.6 mg total) by mouth daily.   empagliflozin (JARDIANCE) 10 MG TABS tablet Take 1 tablet (10 mg total) by mouth daily before breakfast.   hydrALAZINE (APRESOLINE) 25 MG tablet Take 1 tablet (25 mg total) by mouth 3 (three) times daily.   isosorbide mononitrate (IMDUR) 30 MG 24 hr tablet Take 1 tablet (30 mg total) by mouth daily.   labetalol (NORMODYNE) 200 MG tablet TAKE 2 TABLETS (400 MG TOTAL) BY MOUTH 2 (TWO) TIMES DAILY. **DUE FOR YEARLY PHYSICAL**   lisinopril (ZESTRIL) 10 MG tablet Take 2 tablets (20 mg total) by mouth daily.   pantoprazole (PROTONIX) 40 MG tablet TAKE 1 TABLET BY MOUTH TWICE A DAY   simvastatin (ZOCOR) 20 MG tablet TAKE 1 TABLET BY MOUTH EVERY DAY AT 6 PM   No facility-administered encounter medications on file as of 05/23/2021.    Reviewed chart for medication changes ahead of medication coordination call.  No OVs, Consults, or hospital visits since last care coordination call/Pharmacist visit.   No medication changes indicated OR if recent visit, treatment plan here.  BP Readings from Last 3 Encounters:  08/23/20 132/88  03/21/20 (!) 156/94  01/31/20 124/75    Lab Results   Component Value Date   HGBA1C 5.7 (H) 08/23/2020     Patient obtains medications through Adherence Packaging  30 Days   Last adherence delivery included: this is the first adherence delivery   Multiple unsuccessful attempts to reach patient by phone   Star Rating Drugs:  [place] Pharmacist Assistant [phone]

## 2021-05-28 NOTE — Telephone Encounter (Cosign Needed)
2nd attempt

## 2021-05-29 NOTE — Telephone Encounter (Cosign Needed)
3rd attempt

## 2021-06-04 ENCOUNTER — Other Ambulatory Visit: Payer: Self-pay | Admitting: Adult Health

## 2021-06-04 DIAGNOSIS — I10 Essential (primary) hypertension: Secondary | ICD-10-CM

## 2021-06-25 ENCOUNTER — Other Ambulatory Visit: Payer: Self-pay

## 2021-06-25 ENCOUNTER — Ambulatory Visit (INDEPENDENT_AMBULATORY_CARE_PROVIDER_SITE_OTHER): Payer: Medicare Other | Admitting: Adult Health

## 2021-06-25 ENCOUNTER — Encounter: Payer: Self-pay | Admitting: Adult Health

## 2021-06-25 VITALS — BP 150/100 | HR 71 | Temp 98.5°F | Ht 68.0 in | Wt 223.0 lb

## 2021-06-25 DIAGNOSIS — E1121 Type 2 diabetes mellitus with diabetic nephropathy: Secondary | ICD-10-CM

## 2021-06-25 DIAGNOSIS — N1832 Chronic kidney disease, stage 3b: Secondary | ICD-10-CM

## 2021-06-25 DIAGNOSIS — I10 Essential (primary) hypertension: Secondary | ICD-10-CM

## 2021-06-25 LAB — POCT GLYCOSYLATED HEMOGLOBIN (HGB A1C): Hemoglobin A1C: 5.6 % (ref 4.0–5.6)

## 2021-06-25 MED ORDER — EMPAGLIFLOZIN 10 MG PO TABS: 10.0000 mg | ORAL_TABLET | Freq: Every day | ORAL | 0 refills | Status: AC

## 2021-06-25 NOTE — Progress Notes (Signed)
Subjective:    Patient ID: Jared [name], male    DOB: [birth year], 75 y.o.   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  He presents to the office today for three month follow up regarding DM and HTN.   DM -   In March 2022 metformin was discontinued and he was started on Jardiance 10 mg due to history of chronic kidney disease.  He does not monitor his blood sugars at home.  He reports that he could not afford the medication through [facility] as it was going to be close to $300. He went back to taking Metformin. [facility].  Lab Results  Component Value Date   HGBA1C 5.7 (H) 08/23/2020   HTN -prescribed low-dose lisinopril 10 mg twice daily, amlodipine 10 mg daily, labetalol 400 mg twice daily, isosorbide 30 mg daily, and hydralazine 25 mg twice daily. BP Readings from Last 3 Encounters:  06/25/21 (!) 150/100  08/23/20 132/88  03/21/20 (!) 156/94    Review of Systems See HPI   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   GERD (gastroesophageal reflux disease)    Gout 07/26/2007   "on daily RX"  (12/15/2018)   HYPERCHOLESTEROLEMIA, BORDERLINE 01/16/2010   HYPERTENSION 07/26/2007   OSA (obstructive sleep apnea)    "haven't used mask in quite awhile" (12/15/2018)   OSTEOARTHRITIS 09/28/2007   PLANTAR FASCIITIS, BILATERAL  04/19/2010   PROSTATITIS, RECURRENT 11/08/2009   RENAL INSUFFICIENCY 07/26/2007   Sleep apnea    off Cpap    Stroke ([place]) 01/21/2013   "fully recovered" (12/15/2018)   Type II diabetes mellitus ([place])    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   URINARY FREQUENCY, CHRONIC 03/10/2008    Social History   Socioeconomic History   Marital status: Married    Spouse name: [place]   Number of children: 5   Years of education: 12   Highest education level: Not on file  Occupational History   Occupation: TESTOR    Employer: [employer]    Comment: retired  Tobacco Use   Smoking status: Never   Smokeless tobacco: Never  Vaping Use   Vaping Use: Never used  Substance and Sexual Activity   Alcohol use: Not Currently   Drug use: Never   Sexual activity: Not Currently  Other Topics Concern   Not on file  Social History Narrative   He has a part time job for delivering car parts   Married    4 children - All in [place]      He likes to go to car shows and shoot pool.    Social Determinants of Health   Financial Resource Strain: Medium Risk   Difficulty of Paying Living Expenses: Somewhat hard  Food Insecurity: Not on file  Transportation Needs: No Transportation Needs   Lack of Transportation (Medical): No   Lack of Transportation (Non-Medical): No  Physical Activity: Not on file  Stress: Not on file  Social Connections: Not on file  Intimate Partner Violence: Not on  file    Past Surgical History:  Procedure Laterality Date   COLONOSCOPY     CYST EXCISION Left    "leg"   TONSILLECTOMY     UPPER GASTROINTESTINAL ENDOSCOPY     WRIST SURGERY Right    "cut bone out; for gout""    Family History  Problem Relation Age of Onset   Heart disease Mother    Heart disease Father    CVA Father    Colon polyps Father    Diabetes Sister    Colon cancer Neg Hx    Esophageal cancer Neg Hx    Stomach cancer Neg Hx    Pancreatic cancer Neg Hx    Rectal cancer Neg Hx      Allergies  Allergen Reactions   Strawberry Extract Shortness Of Breath    Current Outpatient Medications on File Prior to Visit  Medication Sig Dispense Refill   amLODipine (NORVASC) 10 MG tablet Take 1 tablet (10 mg total) by mouth daily. 90 tablet 1   aspirin EC 81 MG tablet Take 81 mg by mouth daily. Swallow whole.     blood glucose meter kit and supplies KIT Dispense based on patient and insurance preference. Use up to three times daily as directed. 1 each 0   colchicine 0.6 MG tablet Take 1 tablet (0.6 mg total) by mouth daily. 30 tablet 0   empagliflozin (JARDIANCE) 10 MG TABS tablet Take 1 tablet (10 mg total) by mouth daily before breakfast. 14 tablet 0   isosorbide mononitrate (IMDUR) 30 MG 24 hr tablet Take 1 tablet (30 mg total) by mouth daily. 90 tablet 3   labetalol (NORMODYNE) 200 MG tablet TAKE 2 TABLETS (400 MG TOTAL) BY MOUTH 2 (TWO) TIMES DAILY. **DUE FOR YEARLY PHYSICAL** 120 tablet 2   lisinopril (ZESTRIL) 10 MG tablet TAKE 2 TABLETS BY MOUTH EVERY DAY 180 tablet 3   pantoprazole (PROTONIX) 40 MG tablet TAKE 1 TABLET BY MOUTH TWICE A DAY 180 tablet 3   simvastatin (ZOCOR) 20 MG tablet TAKE 1 TABLET BY MOUTH EVERY DAY AT 6 PM 90 tablet 3   allopurinol (ZYLOPRIM) 100 MG tablet Take 1 tablet (100 mg total) by mouth 2 (two) times daily. 180 tablet 3   hydrALAZINE (APRESOLINE) 25 MG tablet Take 1 tablet (25 mg total) by mouth 3 (three) times daily. 270 tablet 3   isosorbide dinitrate (ISORDIL) 20 MG tablet Take 20 mg by mouth 3 (three) times daily.     metFORMIN (GLUCOPHAGE) 500 MG tablet Take 500 mg by mouth 2 (two) times daily.     No current facility-administered medications on file prior to visit.    BP (!) 150/100   Pulse 71   Temp 98.5 F (36.9 C) (Oral)   Ht _0  (1.727 m)   Wt 223 lb (101.2 kg)   SpO2 99%   BMI 33.91 kg/m       Objective:   Physical Exam Vitals and nursing note reviewed.  Constitutional:      Appearance: Normal appearance.   Cardiovascular:     Rate and Rhythm: Normal rate and regular rhythm.     Pulses: Normal pulses.     Heart sounds: Normal heart sounds.  Skin:    General: Skin is warm and dry.     Capillary Refill: Capillary refill takes less than 2 seconds.  Neurological:     General: No focal deficit present.     Mental Status: He is alert and oriented to person, place,  and time.  Psychiatric:        Mood and Affect: Mood normal.        Behavior: Behavior normal.        Thought Content: Thought content normal.        Judgment: Judgment normal.      Assessment & Plan:   1. Type 2 diabetes mellitus with diabetic nephropathy, without long-term current use of insulin (HCC)  - POC HgB A1c - 5.7 - stayed the same  - Will send Jardiance to [facility]  - empagliflozin (JARDIANCE) 10 MG TABS tablet; Take 1 tablet (10 mg total) by mouth daily before breakfast.  Dispense: 90 tablet; Refill: 0  2. Essential hypertension  - empagliflozin (JARDIANCE) 10 MG TABS tablet; Take 1 tablet (10 mg total) by mouth daily before breakfast.  Dispense: 90 tablet; Refill: 0  3. Stage 3b chronic kidney disease (HCC)  - empagliflozin (JARDIANCE) 10 MG TABS tablet; Take 1 tablet (10 mg total) by mouth daily before breakfast.  Dispense: 90 tablet; Refill: 0   Dorothyann Peng, NP

## 2021-06-27 ENCOUNTER — Telehealth: Payer: Self-pay | Admitting: Adult Health

## 2021-06-27 NOTE — Telephone Encounter (Signed)
Pt  came into the office complaining of cost of Jardiance after [facility]. [facility]. Spoke to pharmacist Maddy for further informaiton. According to Maddy, pt switching to [facility] had nothing to do with the Jardiance cost change. Maddy stated that someone has tried to call pt to advised that he is in a "donut hole" with medications. Called pt to advise of update pt stated that this is not accurate. Pt was given the number to insurance for more information.

## 2021-06-27 NOTE — Telephone Encounter (Signed)
Pt call and stated he need a refill on  empagliflozin (JARDIANCE) 10 MG TABS tablet [facility]  [id number] [address], [place] Phone:  [phone]  Fax:  [phone]

## 2021-07-02 ENCOUNTER — Telehealth: Payer: Self-pay | Admitting: Adult Health

## 2021-07-02 NOTE — Telephone Encounter (Signed)
Pt call and stated he have gout in both knees and want something call in [facility]  [id number] [place] [place] Phone:  [phone]  Fax:  [phone]

## 2021-07-03 ENCOUNTER — Encounter: Payer: Self-pay | Admitting: Adult Health

## 2021-07-03 ENCOUNTER — Other Ambulatory Visit: Payer: Self-pay

## 2021-07-03 ENCOUNTER — Ambulatory Visit (INDEPENDENT_AMBULATORY_CARE_PROVIDER_SITE_OTHER): Payer: Medicare Other | Admitting: Adult Health

## 2021-07-03 VITALS — BP 122/84 | HR 77 | Temp 98.8°F | Ht 68.0 in

## 2021-07-03 DIAGNOSIS — M109 Gout, unspecified: Secondary | ICD-10-CM | POA: Diagnosis not present

## 2021-07-03 MED ORDER — PREDNISONE 10 MG PO TABS
ORAL_TABLET | ORAL | 0 refills | Status: DC
Start: 2021-07-03 — End: 2021-09-19

## 2021-07-03 NOTE — Progress Notes (Signed)
Subjective:    Patient ID: JOANTHAN HLAVACEK, male    DOB: [birth year], 75 y.o.   MRN: [id number]  HPI 75 year old male who presents to the office today for concern of an acute gout flare.  He is taking allopurinol as directed, has not had an acute flare in quite some time.  Reports over the last 2 days he has had significant and worsening pain in bilateral knees, swelling, and redness noted as well.  His knees are usually where he gets an acute gout flare.  Reports no changes in diet and does not remember eating or drinking anything that may have caused his acute flare.  At home he has been drinking tart cherry juice without improvement.   Review of Systems See HPI   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   GERD (gastroesophageal reflux disease)    Gout 07/26/2007   "on daily RX"  (12/15/2018)   HYPERCHOLESTEROLEMIA, BORDERLINE 01/16/2010   HYPERTENSION 07/26/2007   OSA (obstructive sleep apnea)    "haven't used mask in quite awhile" (12/15/2018)   OSTEOARTHRITIS 09/28/2007   PLANTAR FASCIITIS, BILATERAL 04/19/2010   PROSTATITIS, RECURRENT 11/08/2009   RENAL INSUFFICIENCY 07/26/2007   Sleep apnea    off Cpap    Stroke ([place]) 01/21/2013   "fully recovered" (12/15/2018)   Type II diabetes mellitus ([place])    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   URINARY FREQUENCY, CHRONIC 03/10/2008    Social History   Socioeconomic History   Marital status: Married    Spouse name: [place]   Number of children: 5   Years of education: 12   Highest education level: Not on file  Occupational History   Occupation: TESTOR    Employer: [employer]    Comment: retired  Tobacco Use   Smoking status: Never   Smokeless tobacco: Never  Vaping Use   Vaping Use: Never used  Substance and Sexual Activity   Alcohol use: Not Currently   Drug use: Never   Sexual activity: Not Currently  Other Topics Concern   Not on file   Social History Narrative   He has a part time job for delivering car parts   Married    4 children - All in [place]      He likes to go to car shows and shoot pool.    Social Determinants of Health   Financial Resource Strain: Medium Risk   Difficulty of Paying Living Expenses: Somewhat hard  Food Insecurity: Not on file  Transportation Needs: No Transportation Needs   Lack of Transportation (Medical): No   Lack of Transportation (Non-Medical): No  Physical Activity: Not on file  Stress: Not on file  Social Connections: Not on file  Intimate Partner Violence: Not on file    Past Surgical History:  Procedure Laterality Date   COLONOSCOPY     CYST EXCISION Left    "leg"   TONSILLECTOMY     UPPER GASTROINTESTINAL ENDOSCOPY     WRIST SURGERY Right    "cut bone out; for gout""    Family History  Problem Relation Age of Onset   Heart disease Mother    Heart disease Father    CVA Father    Colon polyps Father    Diabetes Sister    Colon cancer Neg Hx    Esophageal cancer Neg Hx    Stomach cancer Neg Hx    Pancreatic cancer Neg Hx  Rectal cancer Neg Hx     Allergies  Allergen Reactions   Strawberry Extract Shortness Of Breath    Current Outpatient Medications on File Prior to Visit  Medication Sig Dispense Refill   amLODipine (NORVASC) 10 MG tablet Take 1 tablet (10 mg total) by mouth daily. 90 tablet 1   aspirin EC 81 MG tablet Take 81 mg by mouth daily. Swallow whole.     blood glucose meter kit and supplies KIT Dispense based on patient and insurance preference. Use up to three times daily as directed. 1 each 0   colchicine 0.6 MG tablet Take 1 tablet (0.6 mg total) by mouth daily. 30 tablet 0   empagliflozin (JARDIANCE) 10 MG TABS tablet Take 1 tablet (10 mg total) by mouth daily before breakfast. (Patient taking differently: Take by mouth daily before breakfast.) 90 tablet 0   isosorbide dinitrate (ISORDIL) 20 MG tablet Take 20 mg by mouth 3 (three) times daily.      isosorbide mononitrate (IMDUR) 30 MG 24 hr tablet Take 1 tablet (30 mg total) by mouth daily. 90 tablet 3   labetalol (NORMODYNE) 200 MG tablet TAKE 2 TABLETS (400 MG TOTAL) BY MOUTH 2 (TWO) TIMES DAILY. **DUE FOR YEARLY PHYSICAL** 120 tablet 2   lisinopril (ZESTRIL) 10 MG tablet TAKE 2 TABLETS BY MOUTH EVERY DAY 180 tablet 3   metFORMIN (GLUCOPHAGE) 500 MG tablet Take 500 mg by mouth 2 (two) times daily.     pantoprazole (PROTONIX) 40 MG tablet TAKE 1 TABLET BY MOUTH TWICE A DAY 180 tablet 3   simvastatin (ZOCOR) 20 MG tablet TAKE 1 TABLET BY MOUTH EVERY DAY AT 6 PM 90 tablet 3   allopurinol (ZYLOPRIM) 100 MG tablet Take 1 tablet (100 mg total) by mouth 2 (two) times daily. 180 tablet 3   hydrALAZINE (APRESOLINE) 25 MG tablet Take 1 tablet (25 mg total) by mouth 3 (three) times daily. 270 tablet 3   No current facility-administered medications on file prior to visit.    BP 122/84   Pulse 77   Temp 98.8 F (37.1 C) (Oral)   Ht _0  (1.727 m)   SpO2 97%   BMI 33.91 kg/m       Objective:   Physical Exam Vitals and nursing note reviewed.  Constitutional:      Appearance: Normal appearance.  Cardiovascular:     Rate and Rhythm: Normal rate and regular rhythm.     Pulses: Normal pulses.     Heart sounds: Normal heart sounds.  Pulmonary:     Effort: Pulmonary effort is normal.     Breath sounds: Normal breath sounds.  Musculoskeletal:        General: Swelling present.     Right knee: Swelling and bony tenderness present. Decreased range of motion. Tenderness present.     Left knee: Swelling and bony tenderness present. Decreased range of motion. Tenderness present.     Comments: Redness noted on lateral aspect of right knee  Skin:    General: Skin is warm.     Capillary Refill: Capillary refill takes less than 2 seconds.     Findings: Erythema present.  Neurological:     General: No focal deficit present.     Mental Status: He is alert and oriented to person, place, and  time.  Psychiatric:        Mood and Affect: Mood normal.        Behavior: Behavior normal.        Thought  Content: Thought content normal.        Judgment: Judgment normal.      Assessment & Plan:  1. Acute gout of multiple sites, unspecified cause  - Uric Acid; Future - predniSONE (DELTASONE) 10 MG tablet; 40 mg x 3 days, 20 mg x 3 days, 10 mg x 3 days  Dispense: 21 tablet; Refill: 0 - Follow up if no improvement in the next 2-3 days    Dorothyann Peng, NP

## 2021-07-03 NOTE — Addendum Note (Signed)
Addended by: Tessie Fass D on: 07/03/2021 04:29 PM   Modules accepted: Orders

## 2021-07-03 NOTE — Telephone Encounter (Signed)
Pt has been scheduled.  °

## 2021-07-04 LAB — URIC ACID: Uric Acid, Serum: 6.1 mg/dL (ref 4.0–7.8)

## 2021-07-15 DIAGNOSIS — N1832 Chronic kidney disease, stage 3b: Secondary | ICD-10-CM | POA: Diagnosis not present

## 2021-07-16 ENCOUNTER — Telehealth: Payer: Self-pay | Admitting: Adult Health

## 2021-07-16 NOTE — Telephone Encounter (Signed)
Spoke with patient to schedule Medicare Annual Wellness Visit (AWV) either virtually or in office.  Pt wanted me to call back in 69min  I called patient back no answer  AWV-I per [place] 09/21/14 please schedule at anytime with [facility] Nurse Health Advisor 1 or 2   This should be a 45 minute visit.

## 2021-07-22 ENCOUNTER — Telehealth: Payer: Self-pay | Admitting: Pharmacist

## 2021-07-22 DIAGNOSIS — N2581 Secondary hyperparathyroidism of renal origin: Secondary | ICD-10-CM | POA: Diagnosis not present

## 2021-07-22 DIAGNOSIS — D631 Anemia in chronic kidney disease: Secondary | ICD-10-CM | POA: Diagnosis not present

## 2021-07-22 DIAGNOSIS — N1832 Chronic kidney disease, stage 3b: Secondary | ICD-10-CM | POA: Diagnosis not present

## 2021-07-22 DIAGNOSIS — I129 Hypertensive chronic kidney disease with stage 1 through stage 4 chronic kidney disease, or unspecified chronic kidney disease: Secondary | ICD-10-CM | POA: Diagnosis not present

## 2021-07-22 NOTE — Chronic Care Management (AMB) (Signed)
    Chronic Care Management Pharmacy Assistant   Name: Jared [name]  MRN: [id number] DOB: 1946/09/25  07-22-2021- Patient called to remind of appointment with [name] Clinical Pharmacist on 07-23-2021 at 1pm via phone call   Patient aware of appointment date, time, and type of appointment (either telephone or in person). Patient aware to have/bring all medications, supplements, blood pressure and/or blood sugar logs to visit.  Care Gaps:  AWV - MSG sent to [name] CMA to schedule. Zoster Vaccine - Overdue TDAP- Overdue Eye Exam - Overdue COVID Booster #4 - Overdue Flu Vaccine - Overdue   Star Rating Drug:  Empagliflozin 10 mg - [facility] Lisinopril 55m - last filled [facility]23-[facility] Simvastatin 272m- last filled on 06-25-2021  [facility] Metformin 50031m last [facility]   Any gaps in medications fill history? None   Medications: Outpatient Encounter Medications as of 07/22/2021  Medication Sig   allopurinol (ZYLOPRIM) 100 MG tablet Take 1 tablet (100 mg total) by mouth 2 (two) times daily.   amLODipine (NORVASC) 10 MG tablet Take 1 tablet (10 mg total) by mouth daily.   aspirin EC 81 MG tablet Take 81 mg by mouth daily. Swallow whole.   blood glucose meter kit and supplies KIT Dispense based on patient and insurance preference. Use up to three times daily as directed.   colchicine 0.6 MG tablet Take 1 tablet (0.6 mg total) by mouth daily.   empagliflozin (JARDIANCE) 10 MG TABS tablet Take 1 tablet (10 mg total) by mouth daily before breakfast. (Patient taking differently: Take by mouth daily before breakfast.)   hydrALAZINE (APRESOLINE) 25 MG tablet Take 1 tablet (25 mg total) by mouth 3 (three) times daily.   isosorbide dinitrate (ISORDIL) 20 MG tablet Take 20 mg by mouth 3 (three) times daily.   isosorbide mononitrate (IMDUR) 30 MG 24 hr tablet Take 1 tablet (30 mg total) by mouth daily.   labetalol  (NORMODYNE) 200 MG tablet TAKE 2 TABLETS (400 MG TOTAL) BY MOUTH 2 (TWO) TIMES DAILY. **DUE FOR YEARLY PHYSICAL**   lisinopril (ZESTRIL) 10 MG tablet TAKE 2 TABLETS BY MOUTH EVERY DAY   metFORMIN (GLUCOPHAGE) 500 MG tablet Take 500 mg by mouth 2 (two) times daily.   pantoprazole (PROTONIX) 40 MG tablet TAKE 1 TABLET BY MOUTH TWICE A DAY   predniSONE (DELTASONE) 10 MG tablet 40 mg x 3 days, 20 mg x 3 days, 10 mg x 3 days   simvastatin (ZOCOR) 20 MG tablet TAKE 1 TABLET BY MOUTH EVERY DAY AT 6 PM   No facility-administered encounter medications on file as of 07/22/2021.   [facility] Pharmacist Assistant [phone]

## 2021-07-23 ENCOUNTER — Ambulatory Visit (INDEPENDENT_AMBULATORY_CARE_PROVIDER_SITE_OTHER): Payer: Medicare Other | Admitting: Pharmacist

## 2021-07-23 DIAGNOSIS — M109 Gout, unspecified: Secondary | ICD-10-CM

## 2021-07-23 DIAGNOSIS — E1121 Type 2 diabetes mellitus with diabetic nephropathy: Secondary | ICD-10-CM | POA: Diagnosis not present

## 2021-07-23 NOTE — Progress Notes (Signed)
Chronic Care Management Pharmacy Note  07/24/2021 Name:  Jared [name] MRN:  [id number] DOB:  [birth year]  Summary: Pt reports Jardiance is being held due to kidney function LDL not at goal < 70  Recommendations/Changes made from today's visit: -Recommend switching lisinopril to losartan due to uric acid lowering -Recommend repeat lipid panel and switching to high intensity statin therapy if LDL is not < 70 -Consider increasing allopurinol to target uric acid < 6 depending on kidney function  Plan: Follow up BP assessment in 3-4 weeks   Subjective: Jared [name] is an 75 y.o. year old male who is a primary patient of Dorothyann Peng, NP.  The CCM team was consulted for assistance with disease management and care coordination needs.    Engaged with patient by telephone for follow up visit in response to provider referral for pharmacy case management and/or care coordination services.   Consent to Services:  The patient was given information about Chronic Care Management services, agreed to services, and gave verbal consent prior to initiation of services.  Please see initial visit note for detailed documentation.   Patient Care Team: Dorothyann Peng, NP as PCP - General (Family Medicine) Lenis Dickinson, MD (Inactive) as Referring Physician (Neurology) Ralene Bathe, MD as Consulting Physician (Ophthalmology) Elmarie Shiley, MD as Consulting Physician (Nephrology) Viona Gilmore, [facility] as Pharmacist (Pharmacist)  Recent office visits: 07/03/21 Dorothyann Peng, NP: Patient presented for gout attack. Prescribed prednisone taper. Plan for repeat uric acid level.  03/07/21 Telephone note: Patient was having a gout attack and requested an rx. PCP sent in prednisone.  08/23/20 Dorothyann Peng, NP: Patient presented for annual exam. No medication changes made.  Recent consult visits: 10/15/20 Elmarie Shiley, MD (nephrology): Patient presented for follow up. Unable to access notes.  03/21/20 Scarlette Shorts, MD (gastro): Patient presented for dysphagia and GERD follow up. No changes made. Continued pantoprazole 40 mg BID.  Hospital visits: None in previous 6 months  Objective:  Lab Results  Component Value Date   CREATININE 3.00 (H) 08/23/2020   BUN 29 (H) 08/23/2020   GFR 36.49 (L) 06/30/2019   GFRNONAA 20 (L) 08/23/2020   GFRAA 23 (L) 08/23/2020   NA 142 08/23/2020   K 4.0 08/23/2020   CALCIUM 9.1 08/23/2020   CO2 24 08/23/2020   GLUCOSE 74 08/23/2020    Lab Results  Component Value Date/Time   HGBA1C 5.6 06/25/2021 01:09 PM   HGBA1C 5.7 (H) 08/23/2020 02:21 PM   HGBA1C 6.2 06/30/2019 10:29 AM   HGBA1C 5.8 02/22/2019 03:31 PM   HGBA1C 5.5 08/24/2018 02:43 PM   GFR 36.49 (L) 06/30/2019 10:29 AM   GFR 34.24 (L) 12/28/2018 01:57 PM   MICROALBUR 0.8 06/27/2014 04:05 PM    Last diabetic Eye exam:  Lab Results  Component Value Date/Time   HMDIABEYEEXA No Retinopathy 03/17/2018 12:00 AM    Last diabetic Foot exam: No results found for: HMDIABFOOTEX   Lab Results  Component Value Date   CHOL 154 08/23/2020   HDL 31 (L) 08/23/2020   LDLCALC 91 08/23/2020   LDLDIRECT 85.1 06/03/2013   TRIG 231 (H) 08/23/2020   CHOLHDL 5.0 (H) 08/23/2020    Hepatic Function Latest Ref Rng & Units 08/23/2020 06/30/2019 08/05/2017  Total Protein 6.1 - 8.1 g/dL 7.1 7.6 6.7  Albumin 3.5 - 5.2 g/dL - 4.6 4.5  AST 10 - 35 U/L 21 20 35  ALT 9 - 46 U/L 15 13 40  Alk Phosphatase 39 -  117 U/L - 95 54  Total Bilirubin 0.2 - 1.2 mg/dL 0.5 0.5 0.6  Bilirubin, Direct 0.0 - 0.3 mg/dL - - 0.1    Lab Results  Component Value Date/Time   TSH 0.96 08/23/2020 02:21 PM   TSH 0.91 06/30/2019 10:29 AM    CBC Latest Ref Rng & Units 08/23/2020 06/30/2019 12/16/2018  WBC 3.8 - 10.8 Thousand/uL 5.8 6.5 7.1  Hemoglobin 13.2 - 17.1 g/dL 12.7(L) 12.4(L) 10.6(L)  Hematocrit 38.5 - 50.0 % 36.8(L) 37.1(L) 32.7(L)  Platelets 140 - 400 Thousand/uL 229 243.0 246    Lab Results  Component Value Date/Time    VD25OH 29.5 10/05/2018 12:00 AM   VD25OH 27.4 09/13/2018 12:00 AM    Clinical ASCVD: Yes  The 10-year ASCVD risk score Mikey Bussing DC Jr., et al., 2013) is: 34.3%   Values used to calculate the score:     Age: 29 years     Sex: Male     Is Non-Hispanic African American: Yes     Diabetic: Yes     Tobacco smoker: No     Systolic Blood Pressure: 625 mmHg     Is BP treated: Yes     HDL Cholesterol: 31 mg/dL     Total Cholesterol: 154 mg/dL    Depression screen [facility] 2/9 08/23/2020 12/14/2018 10/23/2014  Decreased Interest 2 0 0  Down, Depressed, Hopeless 0 1 0  PHQ - 2 Score 2 1 0  Some recent data might be hidden      Social History   Tobacco Use  Smoking Status Never  Smokeless Tobacco Never   BP Readings from Last 3 Encounters:  07/03/21 122/84  06/25/21 (!) 150/100  08/23/20 132/88   Pulse Readings from Last 3 Encounters:  07/03/21 77  06/25/21 71  08/23/20 73   Wt Readings from Last 3 Encounters:  06/25/21 223 lb (101.2 kg)  08/23/20 228 lb (103.4 kg)  03/21/20 228 lb (103.4 kg)   BMI Readings from Last 3 Encounters:  07/03/21 33.91 kg/m  06/25/21 33.91 kg/m  08/23/20 34.67 kg/m    Assessment/Interventions: Review of patient past medical history, allergies, medications, health status, including review of consultants reports, laboratory and other test data, was performed as part of comprehensive evaluation and provision of chronic care management services.   SDOH:  (Social Determinants of Health) assessments and interventions performed: Yes    SDOH Screenings   Alcohol Screen: Not on file  Depression (PHQ2-9): Low Risk    PHQ-2 Score: 2  Financial Resource Strain: Medium Risk   Difficulty of Paying Living Expenses: Somewhat hard  Food Insecurity: Not on file  Housing: Not on file  Physical Activity: Not on file  Social Connections: Not on file  Stress: Not on file  Tobacco Use: Low Risk    Smoking Tobacco Use: Never   Smokeless Tobacco Use: Never   Transportation Needs: No Transportation Needs   Lack of Transportation (Medical): No   Lack of Transportation (Non-Medical): No   Patient is doing much better and is no longer having any symptoms of gout. He just went to his kidney doctor and they are holding the [place] for a few days due to his kidney function and plan to repeat in 1 week to consider resuming therapy.  CCM Care Plan  Allergies  Allergen Reactions   Strawberry Extract Shortness Of Breath    Medications Reviewed Today     Reviewed by Viona Gilmore, [facility] (Pharmacist) on 07/23/21 at 1522  Med List Status: <  None>   Medication Order Taking? Sig Documenting Provider Last Dose Status Informant  allopurinol (ZYLOPRIM) 100 MG tablet [id number] Yes Take 1 tablet (100 mg total) by mouth 2 (two) times daily. Nafziger, Tommi Rumps, NP Taking Active   amLODipine (NORVASC) 10 MG tablet [id number]  Take 1 tablet (10 mg total) by mouth daily. Nafziger, Tommi Rumps, NP  Active   aspirin EC 81 MG tablet [id number]  Take 81 mg by mouth daily. Swallow whole. [provider]  Active   blood glucose meter kit and supplies KIT [id number]  Dispense based on patient and insurance preference. Use up to three times daily as directed. Nafziger, Tommi Rumps, NP  Active   colchicine 0.6 MG tablet [id number] Yes Take 1 tablet (0.6 mg total) by mouth daily. Nafziger, Tommi Rumps, NP Taking Active   empagliflozin (JARDIANCE) 10 MG TABS tablet [id number] No Take 1 tablet (10 mg total) by mouth daily before breakfast.  Patient not taking: Reported on 07/23/2021   Dorothyann Peng, NP Not Taking Active   hydrALAZINE (APRESOLINE) 25 MG tablet [id number]  Take 1 tablet (25 mg total) by mouth 3 (three) times daily. Dorothyann Peng, NP  Expired 06/17/21 2359   isosorbide dinitrate (ISORDIL) 20 MG tablet [id number]  Take 20 mg by mouth 3 (three) times daily. [provider]  Active     Discontinued 36/14/43 1540 (Duplicate)   labetalol (NORMODYNE) 200 MG tablet [id number]  TAKE  2 TABLETS (400 MG TOTAL) BY MOUTH 2 (TWO) TIMES DAILY. **DUE FOR YEARLY PHYSICAL** Nafziger, Tommi Rumps, NP  Active   lisinopril (ZESTRIL) 10 MG tablet [id number]  TAKE 2 TABLETS BY MOUTH EVERY DAY Nafziger, Tommi Rumps, NP  Active   metFORMIN (GLUCOPHAGE) 500 MG tablet [id number]  Take 500 mg by mouth 2 (two) times daily. [provider]  Active   pantoprazole (PROTONIX) 40 MG tablet [id number]  TAKE 1 TABLET BY MOUTH TWICE A DAY Irene Shipper, MD  Active   predniSONE (DELTASONE) 10 MG tablet [id number]  40 mg x 3 days, 20 mg x 3 days, 10 mg x 3 days Dorothyann Peng, NP  Active   simvastatin (ZOCOR) 20 MG tablet [id number]  TAKE 1 TABLET BY MOUTH EVERY DAY AT 6 PM Nafziger, Tommi Rumps, NP  Active             Patient Active Problem List   Diagnosis Date Noted   Stage 3b chronic kidney disease ([place]) 08/23/2020   Hypertensive emergency 12/14/2018   AKI (acute kidney injury) ([place])    Hyperlipidemia 05/26/2015   OSA on CPAP 11/13/2013   CVA (cerebral infarction) 01/21/2013   Diabetes mellitus ([place]) 01/21/2013   Knee pain, bilateral 02/19/2011   PLANTAR FASCIITIS, BILATERAL 04/19/2010   UNSPECIFIED DISORDER OF LIPOID METABOLISM 11/08/2009   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   PROSTATITIS, RECURRENT 11/08/2009   URINARY FREQUENCY, CHRONIC 03/10/2008   OSTEOARTHRITIS 09/28/2007   GOUT 07/26/2007   Essential hypertension 07/26/2007   Disorder resulting from impaired renal function 07/26/2007    Immunization History  Administered Date(s) Administered   Fluad Quad(high Dose 65+) 09/16/2019, 09/16/2019, 10/20/2020   Influenza Whole 12/22/2005, 10/02/2008, 11/08/2009   Influenza, High Dose Seasonal PF 01/25/2018, 12/01/2018   Influenza,inj,Quad PF,6+ Mos 09/28/2013   Moderna Sars-Covid-2 Vaccination 02/03/2020, 03/02/2020   PFIZER(Purple Top)SARS-COV-2 Vaccination 12/06/2020   Pneumococcal Conjugate-13 10/23/2014   Pneumococcal Polysaccharide-23 06/02/2016   Td 12/22/2005     Conditions to be addressed/monitored:  Hypertension, Hyperlipidemia, Diabetes, GERD, Chronic Kidney Disease, Osteoarthritis and Gout  Conditions addressed this encounter:  Hyperlipidemia, gout, hypertension  Care Plan : [place]  Updates made by Viona Gilmore, [place] since 07/24/2021 12:00 AM     Problem: Problem: Hypertension, Hyperlipidemia, Diabetes, GERD, Chronic Kidney Disease, Osteoarthritis and Gout      Long-Range Goal: Patient-Specific Goal   Start Date: 03/14/2021  Expected End Date: 03/14/2022  Recent Progress: On track  Priority: High  Note:   Current Barriers:  Unable to independently monitor therapeutic efficacy Unable to achieve control of cholesterol  Suboptimal therapeutic regimen for cholesterol   Pharmacist Clinical Goal(s):  Patient will verbalize ability to afford treatment regimen achieve adherence to monitoring guidelines and medication adherence to achieve therapeutic efficacy achieve control of cholesterol as evidenced by next lipid panel  through collaboration with PharmD and provider.   Interventions: 1:1 collaboration with Dorothyann Peng, NP regarding development and update of comprehensive plan of care as evidenced by provider attestation and co-signature Inter-disciplinary care team collaboration (see longitudinal plan of care) Comprehensive medication review performed; medication list updated in electronic medical record  Hypertension (BP goal <130/80) -Not ideally controlled -Current treatment: Lisinopril 10 mg 1 tablet twice daily Labetalol 200 mg 2 tablets twice daily Isosorbide 20 mg 1 tablet three times daily Hydralazine 25 mg 1 tablet three times daily Amlodipine 10 mg 1 tablet daily -Medications previously tried: n/a  -Current home readings: 120/80; does not check at home -Current dietary habits: doesn't add salt but eats out some; has some bacon and ham occasionally -Current exercise habits: no structured  exercise -Denies hypotensive/hypertensive symptoms -Educated on Daily salt intake goal < 2300 mg; Exercise goal of 150 minutes per week; Importance of home blood pressure monitoring; Proper BP monitoring technique; -Counseled to monitor BP at home weekly, document, and provide log at future appointments -Counseled on diet and exercise extensively Recommended to continue current medication  Hyperlipidemia: (LDL goal < 70) -Uncontrolled -Current treatment: Simvastatin 20 mg 1 tablet daily at 6 pm -Medications previously tried: none -Current dietary patterns: eats vegetables 2-3 times a week and a bananas every day but has cut out junk food; does admit to eating fried foods -Current exercise habits: no structured exercise -Educated on Cholesterol goals;  Benefits of statin for ASCVD risk reduction; Importance of limiting foods high in cholesterol; Exercise goal of 150 minutes per week; -Recommended increase statin intensity   Diabetes (A1c goal <7%) -Controlled -Current medications: Jardiance 10 mg 1 tablet daily - not currently taking -Medications previously tried: [place] (cost, metformin (no longer needed) -Current home glucose readings - does not have a meter at home fasting glucose: n/a post prandial glucose: n/a -Denies hypoglycemic/hyperglycemic symptoms -Current meal patterns:  breakfast: n/a  lunch: n/a  dinner: n/a snacks: n/a drinks: n/a -Current exercise: no structured exercise -Educated on A1c and blood sugar goals; Complications of diabetes including kidney damage, retinal damage, and cardiovascular disease; Exercise goal of 150 minutes per week; Prevention and management of hypoglycemic episodes; Carbohydrate counting and/or plate method -Counseled to check feet daily and get yearly eye exams -Counseled on diet and exercise extensively Recommended to continue current medication  History of stroke (Goal: prevent future strokes) -Controlled -Current  treatment  Simvastatin 20 mg 1 tablet daily at bedtime Aspirin 81 mg 1 tablet daily -Medications previously tried: none  -Recommended to continue current medication Counseled on monitoring for bleeding with use of aspirin  Gout (Goal: uric acid < 6 and prevent flare ups) -Uncontrolled -Current treatment  Allopurinol 100 mg 1 tablet twice daily -Medications previously tried: prednisone (  flare up), colchicine (rx ran out)  -Recommended to continue current medication Counseled on low purine diet foods and drinks Collaborated with PCP to switch lisinopril to losartan due to uric acid lowering and consider increasing allopurinol due to uric acid > 6  CKD (Goal: protect kidneys) -Uncontrolled -Current treatment  Jardiance 10 mg daily - on hold due to kidney function -Medications previously tried: [place] (cost)  -Recommended to continue current medication Counseled on side effects and patient assistance opportunities  Dysphagia (Goal: minimize symptoms) -Controlled -Current treatment  pantoprazole 40 mg 1 tablet twice daily -Medications previously tried: none  -Counseled on non-pharmacologic management of symptoms such as elevating the head of your bed, avoiding eating 2-3 hours before bed, avoiding triggering foods such as acidic, spicy, or fatty foods, eating smaller meals, and wearing clothes that are loose around the waist  Health Maintenance -Vaccine gaps: tetanus, shingles -Current therapy:  Multivitamin 1 tablet daily -Educated on Cost vs benefit of each product must be carefully weighed by individual consumer -Patient is satisfied with current therapy and denies issues -Recommended to continue current medication  Patient Goals/Self-Care Activities Patient will:  - take medications as prescribed check glucose as needed, document, and provide at future appointments check blood pressure weekly, document, and provide at future appointments target a minimum of 150 minutes of  moderate intensity exercise weekly  Follow Up Plan: Telephone follow up appointment with care management team member scheduled for: 4 months       Medication Assistance: None required.  Patient affirms current coverage meets needs.  Patient's preferred pharmacy is:  [id number] [id number]: [phone]: [phone] Uses pill box? No - lines up medications Pt endorses 99% compliance (misses once a month)  We discussed: Current pharmacy is preferred with insurance plan and patient is satisfied with pharmacy services Patient decided to: Continue current medication management strategy  Care Plan and Follow Up Patient Decision:  Patient agrees to Care Plan and Follow-up.  Plan: Telephone follow up appointment with care management team member scheduled for:  4 months  MJeni Salles PharmD BMaysvillePharmacist [place]Wilkes[phone]

## 2021-07-24 NOTE — Patient Instructions (Signed)
Hi Jared [name],  It was great catching up! I'll reach out to you once I hear back from [place].  Please reach out to me if you have any questions or need anything before our follow up!  Best, Maddie  [name], PharmD, [facility]   Visit Information   Goals Addressed   None    Patient Care Plan: CCM Pharmacy Care Plan     Problem Identified: Problem: Hypertension, Hyperlipidemia, Diabetes, GERD, Chronic Kidney Disease, Osteoarthritis and Gout      Long-Range Goal: Patient-Specific Goal   Start Date: 03/14/2021  Expected End Date: 03/14/2022  Recent Progress: On track  Priority: High  Note:   Current Barriers:  Unable to independently monitor therapeutic efficacy Unable to achieve control of cholesterol  Suboptimal therapeutic regimen for cholesterol   Pharmacist Clinical Goal(s):  Patient will verbalize ability to afford treatment regimen achieve adherence to monitoring guidelines and medication adherence to achieve therapeutic efficacy achieve control of cholesterol as evidenced by next lipid panel  through collaboration with PharmD and provider.   Interventions: 1:1 collaboration with Dorothyann Peng, NP regarding development and update of comprehensive plan of care as evidenced by provider attestation and co-signature Inter-disciplinary care team collaboration (see longitudinal plan of care) Comprehensive medication review performed; medication list updated in electronic medical record  Hypertension (BP goal <130/80) -Not ideally controlled -Current treatment: Lisinopril 10 mg 1 tablet twice daily Labetalol 200 mg 2 tablets twice daily Isosorbide 20 mg 1 tablet three times daily Hydralazine 25 mg 1 tablet three times daily Amlodipine 10 mg 1 tablet daily -Medications previously tried: n/a  -Current home readings: 120/80; does not check at home -Current dietary habits: doesn't add salt but eats out some;  has some bacon and ham occasionally -Current exercise habits: no structured exercise -Denies hypotensive/hypertensive symptoms -Educated on Daily salt intake goal < 2300 mg; Exercise goal of 150 minutes per week; Importance of home blood pressure monitoring; Proper BP monitoring technique; -Counseled to monitor BP at home weekly, document, and provide log at future appointments -Counseled on diet and exercise extensively Recommended to continue current medication  Hyperlipidemia: (LDL goal < 70) -Uncontrolled -Current treatment: Simvastatin 20 mg 1 tablet daily at 6 pm -Medications previously tried: none -Current dietary patterns: eats vegetables 2-3 times a week and a bananas every day but has cut out junk food; does admit to eating fried foods -Current exercise habits: no structured exercise -Educated on Cholesterol goals;  Benefits of statin for ASCVD risk reduction; Importance of limiting foods high in cholesterol; Exercise goal of 150 minutes per week; -Recommended increase statin intensity   Diabetes (A1c goal <7%) -Controlled -Current medications: Jardiance 10 mg 1 tablet daily - not currently taking -Medications previously tried: [place] (cost, metformin (no longer needed) -Current home glucose readings - does not have a meter at home fasting glucose: n/a post prandial glucose: n/a -Denies hypoglycemic/hyperglycemic symptoms -Current meal patterns:  breakfast: n/a  lunch: n/a  dinner: n/a snacks: n/a drinks: n/a -Current exercise: no structured exercise -Educated on A1c and blood sugar goals; Complications of diabetes including kidney damage, retinal damage, and cardiovascular disease; Exercise goal of 150 minutes per week; Prevention and management of hypoglycemic episodes; Carbohydrate counting and/or plate method -Counseled to check feet daily and get yearly eye exams -Counseled on diet and exercise extensively Recommended to continue current  medication  History of stroke (Goal: prevent future strokes) -Controlled -Current treatment  Simvastatin 20 mg 1 tablet daily at bedtime  Aspirin 81 mg 1 tablet daily -Medications previously tried: none  -Recommended to continue current medication Counseled on monitoring for bleeding with use of aspirin  Gout (Goal: uric acid < 6 and prevent flare ups) -Uncontrolled -Current treatment  Allopurinol 100 mg 1 tablet twice daily -Medications previously tried: prednisone (flare up), colchicine (rx ran out)  -Recommended to continue current medication Counseled on low purine diet foods and drinks Collaborated with PCP to switch lisinopril to losartan due to uric acid lowering and consider increasing allopurinol due to uric acid > 6  CKD (Goal: protect kidneys) -Uncontrolled -Current treatment  Jardiance 10 mg daily - on hold due to kidney function -Medications previously tried: [place] (cost)  -Recommended to continue current medication Counseled on side effects and patient assistance opportunities  Dysphagia (Goal: minimize symptoms) -Controlled -Current treatment  pantoprazole 40 mg 1 tablet twice daily -Medications previously tried: none  -Counseled on non-pharmacologic management of symptoms such as elevating the head of your bed, avoiding eating 2-3 hours before bed, avoiding triggering foods such as acidic, spicy, or fatty foods, eating smaller meals, and wearing clothes that are loose around the waist  Health Maintenance -Vaccine gaps: tetanus, shingles -Current therapy:  Multivitamin 1 tablet daily -Educated on Cost vs benefit of each product must be carefully weighed by individual consumer -Patient is satisfied with current therapy and denies issues -Recommended to continue current medication  Patient Goals/Self-Care Activities Patient will:  - take medications as prescribed check glucose as needed, document, and provide at future appointments check blood pressure  weekly, document, and provide at future appointments target a minimum of 150 minutes of moderate intensity exercise weekly  Follow Up Plan: Telephone follow up appointment with care management team member scheduled for: 4 months      Patient verbalizes understanding of instructions provided today and agrees to view in [place].  The pharmacy team will reach out to the patient again over the next 30 days.   Viona Gilmore, [facility]

## 2021-07-30 DIAGNOSIS — N1832 Chronic kidney disease, stage 3b: Secondary | ICD-10-CM | POA: Diagnosis not present

## 2021-08-06 ENCOUNTER — Other Ambulatory Visit: Payer: Self-pay | Admitting: Adult Health

## 2021-08-06 ENCOUNTER — Telehealth: Payer: Self-pay | Admitting: Pharmacist

## 2021-08-06 MED ORDER — LOSARTAN POTASSIUM 25 MG PO TABS
25.0000 mg | ORAL_TABLET | Freq: Every day | ORAL | 0 refills | Status: DC
Start: 2021-08-06 — End: 2021-09-19

## 2021-08-06 NOTE — Telephone Encounter (Signed)
Called patient to make him aware of the medication change from lisinopril to losartan for uric acid lowering benefits. Patient is aware to stop taking the lisinopril and start taking the losartan as soon as he can get it from the pharmacy.  Changed his appt with his PCP in September to a CPE as he is overdue for this and routine blood work. Patient is aware and in agreement of the plan.

## 2021-08-12 ENCOUNTER — Telehealth: Payer: Self-pay | Admitting: Adult Health

## 2021-08-12 NOTE — Telephone Encounter (Signed)
Pt wanted to inform the doctor he has covid and wanted to know if Georgina Snell could call him in something for his symptoms.

## 2021-08-13 ENCOUNTER — Encounter: Payer: Self-pay | Admitting: Family Medicine

## 2021-08-13 ENCOUNTER — Other Ambulatory Visit: Payer: Self-pay | Admitting: Adult Health

## 2021-08-13 ENCOUNTER — Telehealth (INDEPENDENT_AMBULATORY_CARE_PROVIDER_SITE_OTHER): Payer: Medicare Other | Admitting: Family Medicine

## 2021-08-13 VITALS — Ht 68.0 in | Wt 223.0 lb

## 2021-08-13 DIAGNOSIS — U071 COVID-19: Secondary | ICD-10-CM | POA: Diagnosis not present

## 2021-08-13 MED ORDER — BENZONATATE 100 MG PO CAPS: 100.0000 mg | ORAL_CAPSULE | Freq: Three times a day (TID) | ORAL | 0 refills | Status: DC | PRN

## 2021-08-13 MED ORDER — MOLNUPIRAVIR EUA 200MG CAPSULE: 4.0000 | ORAL_CAPSULE | Freq: Two times a day (BID) | ORAL | 0 refills | Status: AC

## 2021-08-13 NOTE — Patient Instructions (Addendum)
---------------------------------------------------------------------------------------------------------------------------    WORK SLIP:  Patient Jared [name],  [birth year], was seen for a medical visit today, 08/13/21 . Please excuse from work for a COVID like illness. We advise 10 days minimum from the onset of symptoms (08/10/21) PLUS 1 day of no fever and improved symptoms. Will defer to employer for a sooner return to work if symptoms have resolved, it is greater than 5 days since the positive test and the patient can wear a high-quality, tight fitting mask such as N95 or KN95 at all times for an additional 5 days. Would also suggest COVID19 antigen testing is negative prior to return.  Sincerely: E-signature: Dr. Colin Benton, DO [place] Ph: [phone]   ------------------------------------------------------------------------------------------------------------------------------    HOME CARE TIPS:  -[place] testing information: [email or web address]-[email or web address]/ [phone] Most pharmacies also offer testing and home test kits. If the Covid19 test is positive, please make a prompt follow up visit with your primary care office or with [facility] to discuss treatment options. Treatments for Covid19 are best given early in the course of the illness.   -I sent the medication(s) we discussed to your pharmacy: Meds ordered this encounter  Medications   molnupiravir EUA 200 mg CAPS    Sig: Take 4 capsules (800 mg total) by mouth 2 (two) times daily for 5 days.    Dispense:  40 capsule    Refill:  0   benzonatate (TESSALON PERLES) 100 MG capsule    Sig: Take 1 capsule (100 mg total) by mouth 3 (three) times daily as needed.    Dispense:  20 capsule    Refill:  0     -I sent in the [place] treatment or referral you requested per our discussion. Please see the information provided below and discuss further with  the pharmacist/treatment team.   -If taking an antiviral, there is a chance of rebound illness after finishing your treatment. If you become sick again please isolate for an additional 5 days.    -can use tylenol if needed for fevers, aches and pains per instructions  -can use nasal saline a few times per day if you have nasal congestion  -stay hydrated, drink plenty of fluids and eat small healthy meals - avoid dairy  -follow up with your doctor in 2-3 days unless improving and feeling better  -stay home while sick, except to seek medical care. If you have COVID19, ideally it would be best to stay home for a full 10 days since the onset of symptoms PLUS one day of no fever and feeling better. Wear a good mask that fits snugly (such as N95 or KN95) if around others to reduce the risk of transmission.  It was nice to meet you today, and I really hope you are feeling better soon. I help [facility] out with telemedicine visits on Tuesdays and Thursdays and am available for visits on those days. If you have any concerns or questions following this visit please schedule a follow up visit with your Primary Care doctor or seek care at a local urgent care clinic to avoid delays in care.    Seek in person care or schedule a follow up video visit promptly if your symptoms worsen, new concerns arise or you are not improving with treatment. Call 911 and/or seek emergency care if your symptoms are severe or life threatening.    Fact Sheet for Patients And Caregivers Emergency Use Authorization (EUA) Of LAGEVRIOT (molnupiravir) capsules For Coronavirus Disease  2019 (COVID-19)  What is the most important information I should know about LAGEVRIO? LAGEVRIO may cause serious side effects, including: ? LAGEVRIO may cause harm to your unborn baby. It is not known if LAGEVRIO will harm your baby if you take LAGEVRIO during pregnancy. o LAGEVRIO is not recommended for use in pregnancy. o LAGEVRIO has not  been studied in pregnancy. LAGEVRIO was studied in pregnant animals only. When LAGEVRIO was given to pregnant animals, LAGEVRIO caused harm to their unborn babies. o You and your healthcare provider may decide that you should take LAGEVRIO during pregnancy if there are no other COVID-19 treatment options approved or authorized by the FDA that are accessible or clinically appropriate for you. o If you and your healthcare provider decide that you should take LAGEVRIO during pregnancy, you and your healthcare provider should discuss the known and potential benefits and the potential risks of taking LAGEVRIO during pregnancy. For individuals who are able to become pregnant: ? You should use a reliable method of birth control (contraception) consistently and correctly during treatment with LAGEVRIO and for 4 days after the last dose of LAGEVRIO. Talk to your healthcare provider about reliable birth control methods. ? Before starting treatment with [facility] your healthcare provider may do a pregnancy test to see if you are pregnant before starting treatment with LAGEVRIO. ? Tell your healthcare provider right away if you become pregnant or think you may be pregnant during treatment with LAGEVRIO. Pregnancy Surveillance Program: ? There is a pregnancy surveillance program for individuals who take LAGEVRIO during pregnancy. The purpose of this program is to collect information about the health of you and your baby. Talk to your healthcare provider about how to take part in this program. ? If you take LAGEVRIO during pregnancy and you agree to participate in the pregnancy surveillance program and allow your healthcare provider to share your information with [place], then your healthcare provider will report your use of [place] during pregnancy to [place]. by calling [phone] or [email or web address]. For individuals who are sexually active with partners who  are able to become pregnant: ? It is not known if LAGEVRIO can affect sperm. While the risk is regarded as low, animal studies to fully assess the potential for LAGEVRIO to affect the babies of males treated with LAGEVRIO have not been completed. A reliable method of birth control (contraception) should be used consistently and correctly during treatment with LAGEVRIO and for at least 3 months after the last dose. The risk to sperm beyond 3 months is not known. Studies to understand the risk to sperm beyond 3 months are ongoing. Talk to your healthcare provider about reliable birth control methods. Talk to your healthcare provider if you have questions or concerns about how LAGEVRIO may affect sperm. You are being given this fact sheet because your healthcare provider believes it is necessary to provide you with LAGEVRIO for the treatment of adults with mild-to-moderate coronavirus disease 2019 (COVID-19) with positive results of direct SARS-CoV-2 viral testing, and who are at high risk for progression to severe COVID-19 including hospitalization or death, and for whom other COVID-19 treatment options approved or authorized by the FDA are not accessible or clinically appropriate. The U.S. Food and Drug Administration (FDA) has issued an Emergency Use Authorization (EUA) to make LAGEVRIO available during the COVID-19 pandemic (for more details about an EUA please see "What is an Emergency Use Authorization?" at the end of this document). LAGEVRIO is not an  FDA-approved medicine in the [place]. Read this Fact Sheet for information about LAGEVRIO. Talk to your healthcare provider about your options if you have any questions. It is your choice to take LAGEVRIO.  What is COVID-19? COVID-19 is caused by a virus called a coronavirus. You can get COVID-19 through close contact with another person who has the virus. COVID-19 illnesses have ranged from very mild-to-severe, including illness  resulting in death. While information so far suggests that most COVID-19 illness is mild, serious illness can happen and may cause some of your other medical conditions to become worse. Older people and people of all ages with severe, long lasting (chronic) medical conditions like heart disease, lung disease and diabetes, for example seem to be at higher risk of being hospitalized for COVID-19.  What is LAGEVRIO? LAGEVRIO is an investigational medicine used to treat mild-to-moderate COVID-19 in adults: ? with positive results of direct SARS-CoV-2 viral testing, and ? who are at high risk for progression to severe COVID-19 including hospitalization or death, and for whom other COVID-19 treatment options approved or authorized by the FDA are not accessible or clinically appropriate. The FDA has authorized the emergency use of LAGEVRIO for the treatment of mild-tomoderate COVID-19 in adults under an EUA. For more information on EUA, see the "What is an Emergency Use Authorization (EUA)?" section at the end of this Fact Sheet. LAGEVRIO is not authorized: ? for use in people less than 20 years of age. ? for prevention of COVID-19. ? for people needing hospitalization for COVID-19. ? for use for longer than 5 consecutive days.  What should I tell my healthcare provider before I take LAGEVRIO? Tell your healthcare provider if you: ? Have any allergies ? Are breastfeeding or plan to breastfeed ? Have any serious illnesses ? Are taking any medicines (prescription, over-the-counter, vitamins, or herbal products).  How do I take LAGEVRIO? ? Take LAGEVRIO exactly as your healthcare provider tells you to take it. ? Take 4 capsules of LAGEVRIO every 12 hours (for example, at 8 am and at 8 pm) ? Take LAGEVRIO for 5 days. It is important that you complete the full 5 days of treatment with LAGEVRIO. Do not stop taking LAGEVRIO before you complete the full 5 days of treatment, even if you feel  better. ? Take LAGEVRIO with or without food. ? You should stay in isolation for as long as your healthcare provider tells you to. Talk to your healthcare provider if you are not sure about how to properly isolate while you have COVID-19. ? Swallow LAGEVRIO capsules whole. Do not open, break, or crush the capsules. If you cannot swallow capsules whole, tell your healthcare provider. ? What to do if you miss a dose: o If it has been less than 10 hours since the missed dose, take it as soon as you remember o If it has been more than 10 hours since the missed dose, skip the missed dose and take your dose at the next scheduled time. ? Do not double the dose of LAGEVRIO to make up for a missed dose.  What are the important possible side effects of LAGEVRIO? ? See, "What is the most important information I should know about LAGEVRIO?" ? Allergic Reactions. Allergic reactions can happen in people taking LAGEVRIO, even after only 1 dose. Stop taking LAGEVRIO and call your healthcare provider right away if you get any of the following symptoms of an allergic reaction: o hives o rapid heartbeat o trouble swallowing or  breathing o swelling of the mouth, lips, or face o throat tightness o hoarseness o skin rash The most common side effects of LAGEVRIO are: ? diarrhea ? nausea ? dizziness These are not all the possible side effects of LAGEVRIO. Not many people have taken LAGEVRIO. Serious and unexpected side effects may happen. This medicine is still being studied, so it is possible that all of the risks are not known at this time.  What other treatment choices are there?  Veklury (remdesivir) is FDA-approved as an intravenous (IV) infusion for the treatment of mildto-moderate CZYSA-63 in certain adults and children. Talk with your doctor to see if Marijean Heath is appropriate for you. Like LAGEVRIO, FDA may also allow for the emergency use of other medicines to treat people with COVID-19. Go to  [email or web address].si for more information. It is your choice to be treated or not to be treated with LAGEVRIO. Should you decide not to take it, it will not change your standard medical care.  What if I am breastfeeding? Breastfeeding is not recommended during treatment with LAGEVRIO and for 4 days after the last dose of LAGEVRIO. If you are breastfeeding or plan to breastfeed, talk to your healthcare provider about your options and specific situation before taking LAGEVRIO.  How do I report side effects with LAGEVRIO? Contact your healthcare provider if you have any side effects that bother you or do not go away. Report side effects to FDA MedWatch at SmoothHits.hu or call 1-800-FDA-1088 (1- 401-164-3870).  How should I store [place]? ? Store LAGEVRIO capsules at room temperature between 81F to 71F (20C to 25C). ? Keep LAGEVRIO and all medicines out of the reach of children and pets. How can I learn more about COVID-19? ? Ask your healthcare provider. ? Visit SeekRooms.co.uk ? Contact your local or state public health department. ? Call [place] at [phone] (toll free in the [place] ? Visit [email or web address].com  What Is an Emergency Use Authorization (EUA)? The [place] FDA has made [place] available under an emergency access mechanism called an Emergency Use Authorization (EUA) The EUA is supported by a Presenter, broadcasting Health and Human Service (HHS) declaration that circumstances exist to justify emergency use of drugs and biological products during the COVID-19 pandemic. LAGEVRIO for the treatment of mild-to-moderate COVID-19 in adults with positive results of direct SARS-CoV-2 viral testing, who are at high risk for progression to severe COVID-19, including hospitalization or death, and for whom alternative COVID-19 treatment options approved or  authorized by FDA are not accessible or clinically appropriate, has not undergone the same type of review as an FDA-approved product. In issuing an EUA under the TDDUK-02 public health emergency, the FDA has determined, among other things, that based on the total amount of scientific evidence available including data from adequate and well-controlled clinical trials, if available, it is reasonable to believe that the product may be effective for diagnosing, treating, or preventing COVID-19, or a serious or life-threatening disease or condition caused by COVID-19; that the known and potential benefits of the product, when used to diagnose, treat, or prevent such disease or condition, outweigh the known and potential risks of such product; and that there are no adequate, approved, and available alternatives.  All of these criteria must be met to allow for the product to be used in the treatment of patients during the COVID-19 pandemic. The EUA for LAGEVRIO is in effect for the duration of the COVID-19 declaration justifying emergency use of LAGEVRIO, unless terminated or revoked (  after which LAGEVRIO may no longer be used under the EUA). For patent information: http://rogers.info/ Copyright  2021-2022 [place] [place], [place] and its affiliates. All rights reserved. [id number] Revised: March 2022

## 2021-08-13 NOTE — Progress Notes (Signed)
Virtual Visit via Telephone Note  I connected with Jared [name] on 08/13/21 at 12:55 PM EDT by telephone and verified that I am speaking with the correct person using two identifiers.   I discussed the limitations, risks, security and privacy concerns of performing an evaluation and management service by telephone and the availability of in person appointments. I also discussed with the patient that there may be a patient responsible charge related to this service. The patient expressed understanding and agreed to proceed.  Location patient: home, [place] Location provider: work or home office Participants present for the call: patient, provider, patient's wife Patient did not have a visit with me in the prior 7 days to address this/these issue(s).   History of Present Illness:  Acute telemedicine visit for Covid19: -Onset: 3 days ago -Symptoms include: nasal congestion, cough, sweating, sneezing, fever, headache -feeling a little better today -Denies:NVD, CP, SOB, inability to eat/drink/get out of bed -Pertinent past medical history:see below -Pertinent medication allergies: Allergies  Allergen Reactions   Strawberry Extract Shortness Of Breath  -COVID-19 vaccine status: had 2 doses and a booster -had recent labs with his kidney doctor and renal function not good so they are recheck 0 from what I can find in media seems GFR around low 30s last check  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   GERD (gastroesophageal reflux disease)    Gout 07/26/2007   "on daily RX"  (12/15/2018)   HYPERCHOLESTEROLEMIA, BORDERLINE 01/16/2010   HYPERTENSION 07/26/2007   OSA (obstructive sleep apnea)    "haven't used mask in quite awhile" (12/15/2018)   OSTEOARTHRITIS 09/28/2007   PLANTAR FASCIITIS, BILATERAL 04/19/2010   PROSTATITIS, RECURRENT 11/08/2009   RENAL INSUFFICIENCY 07/26/2007   Sleep apnea    off Cpap    Stroke ([place])  01/21/2013   "fully recovered" (12/15/2018)   Type II diabetes mellitus ([place])    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 I do not appreciate any SOB. Speech and thought processing are grossly intact. Patient reported vitals:  Assessment and Plan:  COVID-19   Discussed treatment options (infusions and oral options and risk of drug interactions), ideal treatment window, potential complications, isolation and precautions for COVID-19.  Discussed possibility of rebound with antivirals and the need to reisolate if it should occur for 5 days. Checked for/reviewed any labs done in the last 90 days with GFR listed in HPI if available. After lengthy discussion, the patient opted for treatment with molnupiraivir due to being higher risk for complications of covid or severe disease and other factors. Discussed EUA status of this drug and the fact that there is preliminary limited knowledge of risks/interactions/side effects per EUA document vs possible benefits and precautions. This information was shared with patient during the visit and also was provided in patient instructions. Also, advised that patient discuss risks/interactions and use with pharmacist/treatment team as well. The patient did want a prescription for cough, Tessalon Rx sent.  Other symptomatic care measures summarized in patient instructions. Work/School slipped offered: provided in patient instructions   Advised to seek prompt video visit follow up with PCP or [facility] or in person care if worsening, new symptoms arise, or if is not improving with treatment. Advised of options for inperson care in case PCP office not available. Did let the patient know that I  only do telemedicine shifts for [facility] on Tuesdays and Thursdays and advised a follow up visit with PCP or at an [facility] if has further questions or  concerns.   Follow Up Instructions:  I did not refer this patient for an OV with me in the next 24 hours for this/these issue(s).  I discussed the assessment and treatment plan with the patient. The patient was provided an opportunity to ask questions and all were answered. The patient agreed with the plan and demonstrated an understanding of the instructions.   I spent 19 minutes on the date of this visit in the care of this patient. See summary of tasks completed to properly care for this patient in the detailed notes above which also included counseling of above, review of PMH, medications, allergies, evaluation of the patient and ordering and/or  instructing patient on testing and care options.     Lucretia Kern, DO

## 2021-08-13 NOTE — Telephone Encounter (Signed)
Pt has been scheduled virtual for today

## 2021-08-14 ENCOUNTER — Other Ambulatory Visit: Payer: Self-pay | Admitting: Adult Health

## 2021-08-14 DIAGNOSIS — I10 Essential (primary) hypertension: Secondary | ICD-10-CM

## 2021-08-16 ENCOUNTER — Telehealth: Payer: Self-pay | Admitting: Adult Health

## 2021-08-16 NOTE — Telephone Encounter (Signed)
Pt call and stated he want Tommi Rumps to call him a vit in to help with covid had covid for one wk.pt want a call back.

## 2021-08-16 NOTE — Telephone Encounter (Signed)
Called pt no answer °

## 2021-08-18 ENCOUNTER — Other Ambulatory Visit: Payer: Self-pay | Admitting: Adult Health

## 2021-08-18 DIAGNOSIS — I10 Essential (primary) hypertension: Secondary | ICD-10-CM

## 2021-08-20 ENCOUNTER — Telehealth: Payer: Self-pay | Admitting: Adult Health

## 2021-08-20 NOTE — Telephone Encounter (Signed)
Tried calling patient to schedule Medicare Annual Wellness Visit (AWV) either virtually or in office. Left  my Herbie Drape number [phone]  No answer   AWV-I per [place] 09/21/14   please schedule at anytime with [facility] Nurse Health Advisor 1 or 2   This should be a 45 minute visit.

## 2021-08-20 NOTE — Telephone Encounter (Signed)
Spoke to pt and he stated he does not need anything. Pt stated he feels better. Closing note.

## 2021-08-25 ENCOUNTER — Other Ambulatory Visit: Payer: Self-pay | Admitting: Adult Health

## 2021-08-25 DIAGNOSIS — M1A00X1 Idiopathic chronic gout, unspecified site, with tophus (tophi): Secondary | ICD-10-CM

## 2021-08-27 ENCOUNTER — Ambulatory Visit: Payer: Medicare Other | Admitting: Adult Health

## 2021-09-04 ENCOUNTER — Telehealth: Payer: Self-pay | Admitting: Pharmacist

## 2021-09-04 NOTE — Progress Notes (Signed)
Chronic Care Management Pharmacy Assistant   Name: ROALD LUKACS  MRN: [id number] DOB: 06/23/1946  Reason for Encounter: Disease State/ Hypertension and Diabetes Assessment Call   Conditions to be addressed/monitored: HTN, HLD, DMII, and Osteoarthritis, CKD and Gout  Recent office visits:  08/13/2021 Colin Benton DO (PCP) - Patient was seen via video visit for positive Covid. Started on Benzonate 163m three times daily prn and Molnupiavir 8034mtwice daily. No follow up.  Recent consult visits:  None  Hospital visits:  None in previous 6 months  Medications: Outpatient Encounter Medications as of 09/04/2021  Medication Sig   allopurinol (ZYLOPRIM) 100 MG tablet TAKE 1 TABLET BY MOUTH TWICE A DAY   amLODipine (NORVASC) 10 MG tablet TAKE 1 TABLET BY MOUTH EVERY DAY   aspirin EC 81 MG tablet Take 81 mg by mouth daily. Swallow whole.   benzonatate (TESSALON PERLES) 100 MG capsule Take 1 capsule (100 mg total) by mouth 3 (three) times daily as needed.   blood glucose meter kit and supplies KIT Dispense based on patient and insurance preference. Use up to three times daily as directed.   Cholecalciferol (VITAMIN D-3) 125 MCG (5000 UT) TABS Take 1 tablet by mouth daily.   colchicine 0.6 MG tablet Take 1 tablet (0.6 mg total) by mouth daily.   empagliflozin (JARDIANCE) 10 MG TABS tablet Take 1 tablet (10 mg total) by mouth daily before breakfast.   hydrALAZINE (APRESOLINE) 25 MG tablet Take 1 tablet (25 mg total) by mouth 3 (three) times daily.   isosorbide dinitrate (ISORDIL) 20 MG tablet Take 20 mg by mouth 3 (three) times daily.   labetalol (NORMODYNE) 200 MG tablet TAKE 2 TABLETS (400 MG TOTAL) BY MOUTH 2 (TWO) TIMES DAILY. **DUE FOR YEARLY PHYSICAL**   losartan (COZAAR) 25 MG tablet Take 1 tablet (25 mg total) by mouth daily.   metFORMIN (GLUCOPHAGE) 500 MG tablet TAKE 1 TABLET BY MOUTH TWICE A DAY WITH MEALS   pantoprazole (PROTONIX) 40 MG tablet TAKE 1 TABLET BY MOUTH TWICE A DAY    predniSONE (DELTASONE) 10 MG tablet 40 mg x 3 days, 20 mg x 3 days, 10 mg x 3 days   simvastatin (ZOCOR) 20 MG tablet TAKE 1 TABLET BY MOUTH EVERY DAY AT 6 PM   No facility-administered encounter medications on file as of 09/04/2021.   Fill History:  ALLOPURINOL 100 MG TABLET 08/27/2021 90   JARDIANCE 10 MG TABLET 08/14/2021 30   LABETALOL HCL 200 MG TABLET 08/30/2021 90   LOSARTAN POTASSIUM 25 MG TAB 08/06/2021 90   LAGEVRIO 200 MG CAP (EUA) 08/13/2021 5   PANTOPRAZOLE SOD DR 40 MG TAB 07/30/2021 90   SIMVASTATIN 20 MG TABLET 06/25/2021 90   AMLODIPINE BESYLATE 10 MG TAB 06/30/2021 90   hydralazine 25 mg tablet 03/26/2021 65   METFORMIN HCL 500 MG TABLET 06/01/2021 90   Recent Relevant Labs: Lab Results  Component Value Date/Time   HGBA1C 5.6 06/25/2021 01:09 PM   HGBA1C 5.7 (H) 08/23/2020 02:21 PM   HGBA1C 6.2 06/30/2019 10:29 AM   HGBA1C 5.8 02/22/2019 03:31 PM   HGBA1C 5.5 08/24/2018 02:43 PM   MICROALBUR 0.8 06/27/2014 04:05 PM    Kidney Function Lab Results  Component Value Date/Time   CREATININE 3.00 (H) 08/23/2020 02:21 PM   CREATININE 2.17 (H) 06/30/2019 10:29 AM   CREATININE 2.42 (H) 12/28/2018 01:57 PM   CREATININE 2.11 (H) 05/24/2015 11:38 AM   GFR 36.49 (L) 06/30/2019 10:29 AM  GFRNONAA 20 (L) 08/23/2020 02:21 PM   GFRAA 23 (L) 08/23/2020 02:21 PM    Recent Office Vitals: BP Readings from Last 3 Encounters:  07/03/21 122/84  06/25/21 (!) 150/100  08/23/20 132/88   Pulse Readings from Last 3 Encounters:  07/03/21 77  06/25/21 71  08/23/20 73    Wt Readings from Last 3 Encounters:  08/13/21 223 lb (101.2 kg)  06/25/21 223 lb (101.2 kg)  08/23/20 228 lb (103.4 kg)     Kidney Function Lab Results  Component Value Date/Time   CREATININE 3.00 (H) 08/23/2020 02:21 PM   CREATININE 2.17 (H) 06/30/2019 10:29 AM   CREATININE 2.42 (H) 12/28/2018 01:57 PM   CREATININE 2.11 (H) 05/24/2015 11:38 AM   GFR 36.49 (L) 06/30/2019 10:29 AM    GFRNONAA 20 (L) 08/23/2020 02:21 PM   GFRAA 23 (L) 08/23/2020 02:21 PM    BMP Latest Ref Rng & Units 08/23/2020 06/30/2019 12/28/2018  Glucose 65 - 99 mg/dL 74 107(H) 95  BUN 7 - 25 mg/dL 29(H) 23 28(H)  Creatinine 0.70 - 1.18 mg/dL 3.00(H) 2.17(H) 2.42(H)  BUN/Creat Ratio 6 - 22 (calc) 10 - -  Sodium 135 - 146 mmol/L 142 139 140  Potassium 3.5 - 5.3 mmol/L 4.0 4.7 4.8  Chloride 98 - 110 mmol/L 108 105 106  CO2 20 - 32 mmol/L '24 26 25  ' Calcium 8.6 - 10.3 mg/dL 9.1 9.3 9.6    Current antihypertensive regimen:  Amlodipine 19m - 1 tablet daily. Losartan 240m- 1 tablet daily. Labetalol 20056m Take 2 tablets twice daily'  How often are you checking your Blood Pressure? daily  Current home BP readings: Patient states his reading today was 140/86 this is his normal reading with occasional readings around 155-160/80's  What recent interventions/DTPs have been made by any provider to improve Blood Pressure control since last CPP Visit: None  Any recent hospitalizations or ED visits since last visit with CPP? No  What diet changes have been made to improve Blood Pressure Control?  Patient states he does not eat red meat, pork and doesn't add salt. Patients diet is generally eggs and bacon for breakfast and maybe cereal. Lunch and dinner are a meat and vegetable or he will eat out. What exercise is being done to improve your Blood Pressure Control?  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  Adherence Review: Is the patient currently on ACE/ARB medication? Yes Does the patient have >5 day gap between last estimated fill dates? No  Recent Relevant Labs: Lab Results  Component Value Date/Time   HGBA1C 5.6 06/25/2021 01:09 PM   HGBA1C 5.7 (H) 08/23/2020 02:21 PM   HGBA1C 6.2 06/30/2019 10:29 AM   HGBA1C 5.8 02/22/2019 03:31 PM   HGBA1C 5.5 08/24/2018 02:43 PM   MICROALBUR 0.8 06/27/2014 04:05 PM    Kidney  Function Lab Results  Component Value Date/Time   CREATININE 3.00 (H) 08/23/2020 02:21 PM   CREATININE 2.17 (H) 06/30/2019 10:29 AM   CREATININE 2.42 (H) 12/28/2018 01:57 PM   CREATININE 2.11 (H) 05/24/2015 11:38 AM   GFR 36.49 (L) 06/30/2019 10:29 AM   GFRNONAA 20 (L) 08/23/2020 02:21 PM   GFRAA 23 (L) 08/23/2020 02:21 PM    Current antihyperglycemic regimen:  Jardiance 61m60mtake 1 tablet before breakfast. Metformin 500mg25make 1 tablet twice daily (Patient was not at home and he believes this medication was  discontinued)  What recent interventions/DTPs have been made to improve glycemic control:  None  Have there been any recent hospitalizations or ED visits since last visit with CPP? No  Patient denies hypoglycemic symptoms, including None  Patient denies hyperglycemic symptoms, including none  How often are you checking your blood sugar?  Patient is not checking his blood sugars  Are you checking your feet daily/regularly? Yes  Adherence Review: Is the patient currently on a STATIN medication? Yes  Is the patient currently on ACE/ARB medication? Yes  Does the patient have >5 day gap between last estimated fill dates? No   Care Gaps:  AWV - message sent to [name] to schedule. Zoster Vaccine - never done Tetanus/TDAP - overdue 12/23/2015 Covid 19 vaccine booster 4 - overdue 04/06/2021 Flu vaccine - due  Star Rating Drugs:  Jardiance 4m - [facility] Losartan 2107m- [facility]21 [facility]atin 2051m [phone]

## 2021-09-05 ENCOUNTER — Encounter: Payer: Medicare Other | Admitting: Adult Health

## 2021-09-10 ENCOUNTER — Telehealth: Payer: Self-pay

## 2021-09-10 NOTE — Progress Notes (Deleted)
Subjective:   Jared [name] is a 75 y.o. male who presents for an Initial Medicare Annual Wellness Visit.   I connected with Akul Shiner today by telephone and verified that I am speaking with the correct person using two identifiers. Location patient: home Location provider: work Persons participating in the virtual visit: patient, provider.   I discussed the limitations, risks, security and privacy concerns of performing an evaluation and management service by telephone and the availability of in person appointments. I also discussed with the patient that there may be a patient responsible charge related to this service. The patient expressed understanding and verbally consented to this telephonic visit.    Interactive audio and video telecommunications were attempted between this provider and patient, however failed, due to patient having technical difficulties OR patient did not have access to video capability.  We continued and completed visit with audio only.    Review of Systems    N/A       Objective:    There were no vitals filed for this visit. There is no height or weight on file to calculate BMI.  Advanced Directives 12/14/2018 05/27/2018 06/09/2015 01/21/2013  Does Patient Have a Medical Advance Directive? No No No Patient does not have advance directive  Would patient like information on creating a medical advance directive? No - Patient declined - - -  Pre-existing out of facility DNR order (yellow form or pink MOST form) - - - No    Current Medications (verified) Outpatient Encounter Medications as of 09/10/2021  Medication Sig   allopurinol (ZYLOPRIM) 100 MG tablet TAKE 1 TABLET BY MOUTH TWICE A DAY   amLODipine (NORVASC) 10 MG tablet TAKE 1 TABLET BY MOUTH EVERY DAY   aspirin EC 81 MG tablet Take 81 mg by mouth daily. Swallow whole.   benzonatate (TESSALON PERLES) 100 MG capsule Take 1 capsule (100 mg total) by mouth 3 (three) times daily as needed.   blood  glucose meter kit and supplies KIT Dispense based on patient and insurance preference. Use up to three times daily as directed.   Cholecalciferol (VITAMIN D-3) 125 MCG (5000 UT) TABS Take 1 tablet by mouth daily.   colchicine 0.6 MG tablet Take 1 tablet (0.6 mg total) by mouth daily.   empagliflozin (JARDIANCE) 10 MG TABS tablet Take 1 tablet (10 mg total) by mouth daily before breakfast.   hydrALAZINE (APRESOLINE) 25 MG tablet Take 1 tablet (25 mg total) by mouth 3 (three) times daily.   isosorbide dinitrate (ISORDIL) 20 MG tablet Take 20 mg by mouth 3 (three) times daily.   labetalol (NORMODYNE) 200 MG tablet TAKE 2 TABLETS (400 MG TOTAL) BY MOUTH 2 (TWO) TIMES DAILY. **DUE FOR YEARLY PHYSICAL**   losartan (COZAAR) 25 MG tablet Take 1 tablet (25 mg total) by mouth daily.   metFORMIN (GLUCOPHAGE) 500 MG tablet TAKE 1 TABLET BY MOUTH TWICE A DAY WITH MEALS   pantoprazole (PROTONIX) 40 MG tablet TAKE 1 TABLET BY MOUTH TWICE A DAY   predniSONE (DELTASONE) 10 MG tablet 40 mg x 3 days, 20 mg x 3 days, 10 mg x 3 days   simvastatin (ZOCOR) 20 MG tablet TAKE 1 TABLET BY MOUTH EVERY DAY AT 6 PM   No facility-administered encounter medications on file as of 09/10/2021.    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   PROSTATITIS, RECURRENT 11/08/2009   RENAL INSUFFICIENCY 07/26/2007   Sleep apnea    off Cpap    Stroke ([place]) 01/21/2013   "fully recovered" (12/15/2018)   Type II diabetes mellitus ([place])    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   URINARY  FREQUENCY, CHRONIC 03/10/2008   Past Surgical History:  Procedure Laterality Date   COLONOSCOPY     CYST EXCISION Left    "leg"   TONSILLECTOMY     UPPER GASTROINTESTINAL ENDOSCOPY     WRIST SURGERY Right    "cut bone out; for gout""   Family History  Problem Relation Age of Onset   Heart disease Mother    Heart disease Father    CVA Father    Colon polyps Father    Diabetes Sister    Colon cancer Neg Hx    Esophageal cancer Neg Hx    Stomach cancer Neg Hx    Pancreatic cancer Neg Hx    Rectal cancer Neg Hx    Social History   Socioeconomic History   Marital status: Married    Spouse name: Arcadia   Number of children: 5   Years of education: 12   Highest education level: Not on file  Occupational History   Occupation: TESTOR    Employer: [employer]    Comment: retired  Tobacco Use   Smoking status: Never   Smokeless tobacco: Never  Vaping Use   Vaping Use: Never used  Substance and Sexual Activity   Alcohol use: Not Currently   Drug use: Never   Sexual activity: Not Currently  Other Topics Concern   Not on file  Social History Narrative   He has a part time job for delivering car parts   Married    4 children - All in [name]      He likes to go to car shows and shoot pool.    Social Determinants of Health   Financial Resource Strain: Medium Risk   Difficulty of Paying Living Expenses: Somewhat hard  Food Insecurity: Not on file  Transportation Needs: No Transportation Needs   Lack of Transportation (Medical): No   Lack of Transportation (Non-Medical): No  Physical Activity: Not on file  Stress: Not on file  Social Connections: Not on file    Tobacco Counseling Counseling given: Not Answered   Clinical Intake:                 Diabetic?yes Nutrition Risk Assessment:  Has the patient had any N/V/D within the last 2 months?  {YES/NO:21197} Does the patient have any non-healing wounds?  {YES/NO:21197} Has the patient had any unintentional  weight loss or weight gain?  {YES/NO:21197}  Diabetes:  Is the patient diabetic?  {YES/NO:21197} If diabetic, was a CBG obtained today?  {YES/NO:21197} Did the patient bring in their glucometer from home?  {YES/NO:21197} How often do you monitor your CBG's? ***.   Financial Strains and Diabetes Management:  Are you having any financial strains with the device, your supplies or your medication? {YES/NO:21197}.  Does the patient want to be seen by Chronic Care Management for management of their diabetes?  {YES/NO:21197} Would the patient like to be referred to a Nutritionist or  for Diabetic Management?  {YES/NO:21197}  Diabetic Exams:  {Diabetic Eye Exam:2101801} {Diabetic Foot Exam:2101802}          Activities of Daily Living No flowsheet data found.  Patient Care Team: Dorothyann Peng, NP as PCP - General (Family Medicine) Lenis Dickinson, MD (Inactive) as Referring Physician (Neurology) Ralene Bathe, MD as Consulting Physician (Ophthalmology) Elmarie Shiley, MD as Consulting Physician (Nephrology) Viona Gilmore, [facility] as Pharmacist (Pharmacist)  Indicate any recent Medical Services you may have received from other than [facility] providers in the past year (date may be approximate).     Assessment:   This is a routine wellness examination for Harinder.  Hearing/Vision screen No results found.  Dietary issues and exercise activities discussed:     Goals Addressed   None    Depression Screen PHQ 2/9 Scores 08/23/2020 12/14/2018 10/23/2014  PHQ - 2 Score 2 1 0    Fall Risk Fall Risk  08/23/2020 12/14/2018 10/23/2014  Falls in the past year? 0 0 No  Number falls in past yr: 0 0 -  Injury with Fall? 0 0 -    FALL RISK PREVENTION PERTAINING TO THE HOME:  Any stairs in or around the home? {YES/NO:21197} If so, are there any without handrails? {YES/NO:21197} Home free of loose throw rugs in walkways, pet beds, electrical cords, etc? {YES/NO:21197} Adequate lighting in  your home to reduce risk of falls? {YES/NO:21197}  ASSISTIVE DEVICES UTILIZED TO PREVENT FALLS:  Life alert? {YES/NO:21197} Use of a cane, walker or w/c? {YES/NO:21197} Grab bars in the bathroom? {YES/NO:21197} Shower chair or bench in shower? {YES/NO:21197} Elevated toilet seat or a handicapped toilet? {YES/NO:21197}  TIMED UP AND GO:  Was the test performed? {YES/NO:21197}.  Length of time to ambulate 10 feet: *** sec.   {Appearance of YJEH:6314970}  Cognitive Function:        Immunizations Immunization History  Administered Date(s) Administered   Fluad Quad(high Dose 65+) 09/16/2019, 09/16/2019, 10/20/2020   Influenza Whole 12/22/2005, 10/02/2008, 11/08/2009   Influenza, High Dose Seasonal PF 01/25/2018, 12/01/2018   Influenza,inj,Quad PF,6+ Mos 09/28/2013   Moderna Sars-Covid-2 Vaccination 02/03/2020, 03/02/2020   PFIZER(Purple Top)SARS-COV-2 Vaccination 12/06/2020   Pneumococcal Conjugate-13 10/23/2014   Pneumococcal Polysaccharide-23 06/02/2016   Td 12/22/2005    {TDAP status:2101805}  {Flu Vaccine status:2101806}  {Pneumococcal vaccine status:2101807}  {Covid-19 vaccine status:2101808}  Qualifies for Shingles Vaccine? {YES/NO:21197}  Zostavax completed {YES/NO:21197}  {Shingrix Completed?:2101804}  Screening Tests Health Maintenance  Topic Date Due   Zoster Vaccines- Shingrix (1 of 2) Never done   TETANUS/TDAP  12/23/2015   COVID-19 Vaccine (4 - Booster for Moderna series) 04/06/2021   INFLUENZA VACCINE  07/22/2021   HEMOGLOBIN A1C  12/26/2021   FOOT EXAM  06/25/2022   OPHTHALMOLOGY EXAM  08/06/2022   COLONOSCOPY (Pts 45-2yr Insurance coverage will need to be confirmed)  05/28/2023   Hepatitis C Screening  Completed   HPV VACCINES  Aged Out    Health Maintenance  Health Maintenance Due  Topic Date Due   Zoster Vaccines- Shingrix (1 of 2) Never done   TETANUS/TDAP  12/23/2015   COVID-19 Vaccine (4 - Booster for Moderna series) 04/06/2021    INFLUENZA VACCINE  07/22/2021    {Colorectal cancer screening:2101809}  Lung Cancer Screening: (Low Dose CT Chest recommended if Age 73-80years, 30 pack-year currently smoking OR have quit w/in 15years.) {DOES NOT does:27190::"does not"} qualify.   Lung Cancer Screening Referral: ***  Additional Screening:  Hepatitis C Screening: {DOES NOT does:27190::"does not"} qualify; Completed ***  Vision Screening: Recommended annual ophthalmology exams for early detection of glaucoma and other disorders of the eye. Is the patient up to date with their annual eye exam?  {YES/NO:21197} Who is the provider or what is the name of the office in which the patient attends annual eye exams? *** If pt is not established with a provider, would they like to be referred to a provider to establish care? {YES/NO:21197}.   Dental Screening: Recommended annual dental exams for proper oral hygiene  Community Resource Referral / Chronic Care Management: CRR required this visit?  {YES/NO:21197}  CCM required this visit?  {YES/NO:21197}     Plan:     I have personally reviewed and noted the following in the patient's chart:   Medical and social history Use of alcohol, tobacco or illicit drugs  Current medications and supplements including opioid prescriptions. {Opioid Prescriptions:[phone]} Functional ability and status Nutritional status Physical activity Advanced directives List of other physicians Hospitalizations, surgeries, and ER visits in previous 12 months Vitals Screenings to include cognitive, depression, and falls Referrals and appointments  In addition, I have reviewed and discussed with patient certain preventive protocols, quality metrics, and best practice recommendations. A written personalized care plan for preventive services as well as general preventive health recommendations were provided to patient.     Randel Pigg, LPN   9/89/2119   Nurse Notes: ***

## 2021-09-10 NOTE — Telephone Encounter (Signed)
Called patient x3 for 1030  [place] no answer no VM .  Patient may reschedule next available appointment.   L.Tuesday Terlecki,LPN

## 2021-09-18 ENCOUNTER — Other Ambulatory Visit: Payer: Self-pay | Admitting: Adult Health

## 2021-09-18 ENCOUNTER — Other Ambulatory Visit: Payer: Self-pay

## 2021-09-18 DIAGNOSIS — I10 Essential (primary) hypertension: Secondary | ICD-10-CM

## 2021-09-19 ENCOUNTER — Telehealth: Payer: Self-pay | Admitting: Adult Health

## 2021-09-19 ENCOUNTER — Encounter: Payer: Self-pay | Admitting: Adult Health

## 2021-09-19 ENCOUNTER — Ambulatory Visit (INDEPENDENT_AMBULATORY_CARE_PROVIDER_SITE_OTHER): Payer: Medicare Other | Admitting: Adult Health

## 2021-09-19 VITALS — BP 142/88 | HR 62 | Temp 98.5°F | Ht 67.5 in | Wt 222.0 lb

## 2021-09-19 DIAGNOSIS — E782 Mixed hyperlipidemia: Secondary | ICD-10-CM

## 2021-09-19 DIAGNOSIS — I10 Essential (primary) hypertension: Secondary | ICD-10-CM

## 2021-09-19 DIAGNOSIS — Z0000 Encounter for general adult medical examination without abnormal findings: Secondary | ICD-10-CM | POA: Diagnosis not present

## 2021-09-19 DIAGNOSIS — E1121 Type 2 diabetes mellitus with diabetic nephropathy: Secondary | ICD-10-CM | POA: Diagnosis not present

## 2021-09-19 DIAGNOSIS — M1A09X0 Idiopathic chronic gout, multiple sites, without tophus (tophi): Secondary | ICD-10-CM

## 2021-09-19 DIAGNOSIS — Z23 Encounter for immunization: Secondary | ICD-10-CM | POA: Diagnosis not present

## 2021-09-19 DIAGNOSIS — N1832 Chronic kidney disease, stage 3b: Secondary | ICD-10-CM

## 2021-09-19 LAB — CBC WITH DIFFERENTIAL/PLATELET
Basophils Absolute: 0.1 10*3/uL (ref 0.0–0.1)
Basophils Relative: 1.2 % (ref 0.0–3.0)
Eosinophils Absolute: 0.3 10*3/uL (ref 0.0–0.7)
Eosinophils Relative: 6.3 % — ABNORMAL HIGH (ref 0.0–5.0)
HCT: 40.1 % (ref 39.0–52.0)
Hemoglobin: 13.3 g/dL (ref 13.0–17.0)
Lymphocytes Relative: 23.7 % (ref 12.0–46.0)
Lymphs Abs: 1.2 10*3/uL (ref 0.7–4.0)
MCHC: 33.2 g/dL (ref 30.0–36.0)
MCV: 90.4 fl (ref 78.0–100.0)
Monocytes Absolute: 0.6 10*3/uL (ref 0.1–1.0)
Monocytes Relative: 12 % (ref 3.0–12.0)
Neutro Abs: 2.8 10*3/uL (ref 1.4–7.7)
Neutrophils Relative %: 56.8 % (ref 43.0–77.0)
Platelets: 196 10*3/uL (ref 150.0–400.0)
RBC: 4.44 Mil/uL (ref 4.22–5.81)
RDW: 14.8 % (ref 11.5–15.5)
WBC: 5 10*3/uL (ref 4.0–10.5)

## 2021-09-19 LAB — COMPREHENSIVE METABOLIC PANEL
ALT: 16 U/L (ref 0–53)
AST: 21 U/L (ref 0–37)
Albumin: 4.4 g/dL (ref 3.5–5.2)
Alkaline Phosphatase: 72 U/L (ref 39–117)
BUN: 26 mg/dL — ABNORMAL HIGH (ref 6–23)
CO2: 25 mEq/L (ref 19–32)
Calcium: 9.6 mg/dL (ref 8.4–10.5)
Chloride: 107 mEq/L (ref 96–112)
Creatinine, Ser: 2.48 mg/dL — ABNORMAL HIGH (ref 0.40–1.50)
GFR: 25.21 mL/min — ABNORMAL LOW (ref >60.00)
Glucose, Bld: 108 mg/dL — ABNORMAL HIGH (ref 70–99)
Potassium: 4.4 mEq/L (ref 3.5–5.1)
Sodium: 142 mEq/L (ref 135–145)
Total Bilirubin: 0.5 mg/dL (ref 0.2–1.2)
Total Protein: 7.5 g/dL (ref 6.0–8.3)

## 2021-09-19 LAB — LIPID PANEL
Cholesterol: 158 mg/dL (ref 0–200)
HDL: 37.5 mg/dL — ABNORMAL LOW (ref >39.00)
LDL Cholesterol: 96 mg/dL (ref 0–99)
NonHDL: 120.75
Total CHOL/HDL Ratio: 4
Triglycerides: 124 mg/dL (ref 0.0–149.0)
VLDL: 24.8 mg/dL (ref 0.0–40.0)

## 2021-09-19 LAB — PSA: PSA: 1.61 ng/mL (ref 0.10–4.00)

## 2021-09-19 LAB — URIC ACID: Uric Acid, Serum: 9.7 mg/dL — ABNORMAL HIGH (ref 4.0–7.8)

## 2021-09-19 LAB — HEMOGLOBIN A1C: Hgb A1c MFr Bld: 6.2 % (ref 4.6–6.5)

## 2021-09-19 LAB — TSH: TSH: 0.95 u[IU]/mL (ref 0.35–5.50)

## 2021-09-19 MED ORDER — ISOSORBIDE DINITRATE 20 MG PO TABS: 20.0000 mg | ORAL_TABLET | Freq: Three times a day (TID) | ORAL | 3 refills | Status: DC

## 2021-09-19 NOTE — Patient Instructions (Signed)
It was great seeing you today   We will follow up with you regarding your labs   I will see you back in three months or sooner if needed  Please work on weight loss through diet and exercise

## 2021-09-19 NOTE — Telephone Encounter (Signed)
Please advise 

## 2021-09-19 NOTE — Progress Notes (Addendum)
Subjective:    Patient ID: Jared [name], male    DOB: 1945/12/31, 75 y.o.   MRN: [id number]  HPI Patient presents for yearly preventative medicine examination. 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  DM II- currently prescribed Jardiance 10 mg. This was switched from Metformin for kidney protection. He reports that when he went to get the Jardiance refilled it was going to cost him $110 dollars. He cannot afford it and has not been taking for the last 2-3 days.   HTN-prescribed losartan 25 mg,amlodipine 10 mg daily, labetalol 400 mg twice daily, isosorbide 30 mg daily, and hydralazine 25 mg twice daily.  He denies dizziness, lightheadedness, chest pain, or shortness of breath. He has not taken his blood pressure medications today. He has been checking his blood pressure at home and reports readings in the 120-140's.  BP Readings from Last 3 Encounters:  09/19/21 (!) 142/88  07/03/21 122/84  06/25/21 (!) 150/100   GOUT -takes allopurinol 100 mg twice daily.  Last gout flare in July 2022  Hyperlipidemia -prescribed Zocor 20 mg nightly.  He denies myalgia or fatigue Lab Results  Component Value Date   CHOL 154 08/23/2020   HDL 31 (L) 08/23/2020   LDLCALC 91 08/23/2020   LDLDIRECT 85.1 06/03/2013   TRIG 231 (H) 08/23/2020   CHOLHDL 5.0 (H) 08/23/2020   GERD- takes Protonix 40 mg daily. Feels controlled.   ED -ongoing issue.   Has trouble getting and maintaining an erection.  Does not feel as though desire is waning.  He is interested in having his testosterone checked  CKD stage III -is seen at [facility], last seen July 25, 2021.  At this time his renal function was significantly lower with creatinine up to 2.8 from his baseline that is usually around 2.2.  At this time nephrology suspected that this might be hemodynamically mediated in the setting of ongoing ACE inhibitor and SLG 2 inhibitor use with the prevalent he of his job demands.  He was advised to hold Jardiance for 3 days and make a conscious effort of increasing fluid to greater than 64 ounces a day and then restart his medications.  All immunizations and health maintenance protocols were reviewed with the patient and needed orders were placed.  Appropriate screening laboratory values were ordered for the patient including screening of hyperlipidemia, renal function and hepatic function. If indicated by BPH, a PSA was ordered.  Medication reconciliation,  past medical history, social history, problem list and allergies were reviewed in detail with the patient  Goals were established with regard to weight loss, exercise, and  diet in compliance with medications.  Does not exercise much but tries to stay active with a part-time job.  Tries to eat healthy  Wt Readings from Last 3 Encounters:  09/19/21 222 lb (100.7 kg)  08/13/21 223 lb (101.2 kg)  06/25/21 223 lb (101.2 kg)    Review of Systems  Constitutional: Negative.   HENT: Negative.    Eyes: Negative.   Respiratory: Negative.    Cardiovascular: Negative.   Gastrointestinal: Negative.   Endocrine: Negative.  Genitourinary: Negative.   Musculoskeletal: Negative.   Skin: Negative.   Allergic/Immunologic: Negative.   Neurological: Negative.   Hematological: Negative.   Psychiatric/Behavioral: Negative.    All other systems reviewed and are negative.  Past Medical History:   Diagnosis Date   Childhood asthma    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   GERD (gastroesophageal reflux disease)    Gout 07/26/2007   "on daily RX"  (12/15/2018)   HYPERCHOLESTEROLEMIA, BORDERLINE 01/16/2010   HYPERTENSION 07/26/2007   OSA (obstructive sleep apnea)    "haven't used mask in quite awhile" (12/15/2018)   OSTEOARTHRITIS 09/28/2007   PLANTAR FASCIITIS, BILATERAL 04/19/2010   PROSTATITIS, RECURRENT 11/08/2009   RENAL INSUFFICIENCY 07/26/2007   Sleep apnea    off Cpap    Stroke ([place]) 01/21/2013   "fully recovered" (12/15/2018)   Type II diabetes mellitus ([place])    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   URINARY FREQUENCY, CHRONIC 03/10/2008    Social History   Socioeconomic History   Marital status: Married    Spouse name: [place]   Number of children: 5   Years of education: 12   Highest education level: Not on file  Occupational History   Occupation: TESTOR    Employer: [employer]    Comment: retired  Tobacco Use   Smoking status: Never   Smokeless tobacco: Never  Vaping Use   Vaping Use: Never used  Substance and Sexual Activity   Alcohol use: Not Currently   Drug use: Never   Sexual activity: Not Currently  Other Topics Concern   Not on file  Social History Narrative   He has a part time job for delivering car parts   Married    4 children - All in [place]      He likes to go to car shows and shoot pool.    Social Determinants of Health   Financial Resource Strain: Medium Risk   Difficulty of Paying Living Expenses: Somewhat hard  Food Insecurity: Not on file  Transportation Needs: No Transportation Needs   Lack of Transportation (Medical): No   Lack of Transportation (Non-Medical): No  Physical Activity: Not on file  Stress: Not on file  Social Connections: Not on file  Intimate Partner Violence: Not on file    Past Surgical History:  Procedure Laterality Date   COLONOSCOPY     CYST EXCISION Left    "leg"    TONSILLECTOMY     UPPER GASTROINTESTINAL ENDOSCOPY     WRIST SURGERY Right    "cut bone out; for gout""    Family History  Problem Relation Age of Onset   Heart disease Mother    Heart disease Father    CVA Father    Colon polyps Father    Diabetes Sister    Colon cancer Neg Hx    Esophageal cancer Neg Hx    Stomach cancer Neg Hx    Pancreatic cancer Neg Hx    Rectal cancer Neg Hx     Allergies  Allergen Reactions   Strawberry Extract Shortness Of Breath    Current Outpatient Medications on File Prior to Visit  Medication Sig Dispense Refill   allopurinol (ZYLOPRIM) 100 MG tablet TAKE 1 TABLET BY MOUTH TWICE A DAY 180 tablet 3   amLODipine (NORVASC) 10 MG tablet TAKE 1 TABLET BY MOUTH EVERY DAY 90 tablet 1   aspirin EC 81 MG tablet Take 81 mg by mouth daily. Swallow whole.     benzonatate (TESSALON PERLES) 100  MG capsule Take 1 capsule (100 mg total) by mouth 3 (three) times daily as needed. 20 capsule 0   blood glucose meter kit and supplies KIT Dispense based on patient and insurance preference. Use up to three times daily as directed. 1 each 0   Cholecalciferol (VITAMIN D-3) 125 MCG (5000 UT) TABS Take 1 tablet by mouth daily.     colchicine 0.6 MG tablet Take 1 tablet (0.6 mg total) by mouth daily. 30 tablet 0   empagliflozin (JARDIANCE) 10 MG TABS tablet Take 1 tablet (10 mg total) by mouth daily before breakfast. 90 tablet 0   hydrALAZINE (APRESOLINE) 25 MG tablet Take 1 tablet (25 mg total) by mouth 3 (three) times daily. 270 tablet 3   isosorbide dinitrate (ISORDIL) 20 MG tablet Take 20 mg by mouth 3 (three) times daily.     labetalol (NORMODYNE) 200 MG tablet TAKE 2 TABLETS (400 MG TOTAL) BY MOUTH 2 (TWO) TIMES DAILY. **DUE FOR YEARLY PHYSICAL** 360 tablet 0   losartan (COZAAR) 25 MG tablet TAKE 1 TABLET (25 MG TOTAL) BY MOUTH DAILY. 90 tablet 0   metFORMIN (GLUCOPHAGE) 500 MG tablet TAKE 1 TABLET BY MOUTH TWICE A DAY WITH MEALS 180 tablet 1   pantoprazole (PROTONIX)  40 MG tablet TAKE 1 TABLET BY MOUTH TWICE A DAY 180 tablet 3   predniSONE (DELTASONE) 10 MG tablet 40 mg x 3 days, 20 mg x 3 days, 10 mg x 3 days 21 tablet 0   simvastatin (ZOCOR) 20 MG tablet TAKE 1 TABLET BY MOUTH EVERY DAY AT 6 PM 90 tablet 3   No current facility-administered medications on file prior to visit.    BP (!) 142/88   Pulse 62   Temp 98.5 F (36.9 C) (Oral)   Ht 5' 7.5" (1.715 m)   Wt 222 lb (100.7 kg)   SpO2 100%   BMI 34.26 kg/m        Objective:   Physical Exam Vitals and nursing note reviewed.  Constitutional:      General: He is not in acute distress.    Appearance: Normal appearance. He is well-developed and normal weight.  HENT:     Head: Normocephalic and atraumatic.     Right Ear: Tympanic membrane, ear canal and external ear normal. There is no impacted cerumen.     Left Ear: Tympanic membrane, ear canal and external ear normal. There is no impacted cerumen.     Nose: Nose normal. No congestion or rhinorrhea.     Mouth/Throat:     Mouth: Mucous membranes are moist.     Pharynx: Oropharynx is clear. No oropharyngeal exudate or posterior oropharyngeal erythema.  Eyes:     General:        Right eye: No discharge.        Left eye: No discharge.     Extraocular Movements: Extraocular movements intact.     Conjunctiva/sclera: Conjunctivae normal.     Pupils: Pupils are equal, round, and reactive to light.  Neck:     Vascular: No carotid bruit.     Trachea: No tracheal deviation.  Cardiovascular:     Rate and Rhythm: Normal rate and regular rhythm.     Pulses: Normal pulses.     Heart sounds: Normal heart sounds. No murmur heard.   No friction rub. No gallop.  Pulmonary:     Effort: Pulmonary effort is normal. No respiratory distress.     Breath sounds: Normal breath sounds. No stridor. No wheezing,  rhonchi or rales.  Chest:     Chest wall: No tenderness.  Abdominal:     General: Bowel sounds are normal. There is no distension.      Palpations: Abdomen is soft. There is no mass.     Tenderness: There is no abdominal tenderness. There is no right CVA tenderness, left CVA tenderness, guarding or rebound.     Hernia: No hernia is present.  Musculoskeletal:        General: No swelling, tenderness, deformity or signs of injury. Normal range of motion.     Right lower leg: No edema.     Left lower leg: No edema.  Lymphadenopathy:     Cervical: No cervical adenopathy.  Skin:    General: Skin is warm and dry.     Capillary Refill: Capillary refill takes less than 2 seconds.     Coloration: Skin is not jaundiced or pale.     Findings: No bruising, erythema, lesion or rash.  Neurological:     General: No focal deficit present.     Mental Status: He is alert and oriented to person, place, and time.     Cranial Nerves: No cranial nerve deficit.     Sensory: No sensory deficit.     Motor: No weakness.     Coordination: Coordination normal.     Gait: Gait normal.     Deep Tendon Reflexes: Reflexes normal.  Psychiatric:        Mood and Affect: Mood normal.        Behavior: Behavior normal.        Thought Content: Thought content normal.        Judgment: Judgment normal.      Assessment & Plan:   1. Routine general medical examination at a health care facility - Encouraged to exercise more to help with weight loss - Follow up in one year or sooner if needed - CBC with Differential/Platelet; Future - Comprehensive metabolic panel; Future - Hemoglobin A1c; Future - Lipid panel; Future - TSH; Future - PSA; Future - Uric Acid; Future - Testosterone Total,Free,Bio, Males; Future - Testosterone Total,Free,Bio, Males - Uric Acid - PSA - TSH - Lipid panel - Hemoglobin A1c - Comprehensive metabolic panel - CBC with Differential/Platelet  2. Mixed hyperlipidemia - Consider increase in statin  - CBC with Differential/Platelet; Future - Comprehensive metabolic panel; Future - Hemoglobin A1c; Future - Lipid panel;  Future - TSH; Future - PSA; Future - Uric Acid; Future - Uric Acid - PSA - TSH - Lipid panel - Hemoglobin A1c - Comprehensive metabolic panel - CBC with Differential/Platelet  3. Stage 3b chronic kidney disease ([place]) - Advoid nephrotic agents - CBC with Differential/Platelet; Future - Comprehensive metabolic panel; Future - Hemoglobin A1c; Future - Lipid panel; Future - TSH; Future - PSA; Future - Uric Acid; Future - Uric Acid - PSA - TSH - Lipid panel - Hemoglobin A1c - Comprehensive metabolic panel - CBC with Differential/Platelet  4. Essential hypertension - Not at goal today but did not take his medications prior to appointment  - isosorbide dinitrate (ISORDIL) 20 MG tablet; Take 1 tablet (20 mg total) by mouth 3 (three) times daily.  Dispense: 270 tablet; Refill: 3 - CBC with Differential/Platelet; Future - Comprehensive metabolic panel; Future - Hemoglobin A1c; Future - Lipid panel; Future - TSH; Future - PSA; Future - Uric Acid; Future - Uric Acid - PSA - TSH - Lipid panel - Hemoglobin A1c - Comprehensive metabolic panel - CBC with  Differential/Platelet  5. Type 2 diabetes mellitus with diabetic nephropathy, without long-term current use of insulin ([place]) - Likely placed back on Metformin. Will look at other options as well  - CBC with Differential/Platelet; Future - Comprehensive metabolic panel; Future - Hemoglobin A1c; Future - Lipid panel; Future - TSH; Future - PSA; Future - Uric Acid; Future - Uric Acid - PSA - TSH - Lipid panel - Hemoglobin A1c - Comprehensive metabolic panel - CBC with Differential/Platelet  6. Chronic gout of multiple sites, unspecified cause - Consider increasing Allopurinol to 300 mg, depending on kidney function  - CBC with Differential/Platelet; Future - Comprehensive metabolic panel; Future - Hemoglobin A1c; Future - Lipid panel; Future - TSH; Future - PSA; Future - Uric Acid; Future - Uric Acid - PSA -  TSH - Lipid panel - Hemoglobin A1c - Comprehensive metabolic panel - CBC with Differential/Platelet  7. Need for immunization against influenza  - Flu Vaccine QUAD High Dose(Fluad)   Dorothyann Peng, NP

## 2021-09-19 NOTE — Telephone Encounter (Signed)
Patient stated that he was supposed to get a refill/sample of empagliflozin (JARDIANCE) 10 MG TABS tablet [[id number]] .   Patient could be contacted at [phone].  Please advise.

## 2021-09-20 ENCOUNTER — Other Ambulatory Visit: Payer: Self-pay | Admitting: Adult Health

## 2021-09-20 ENCOUNTER — Telehealth: Payer: Self-pay | Admitting: Adult Health

## 2021-09-20 DIAGNOSIS — N529 Male erectile dysfunction, unspecified: Secondary | ICD-10-CM

## 2021-09-20 LAB — TESTOSTERONE TOTAL,FREE,BIO, MALES
Albumin: 4.4 g/dL (ref 3.6–5.1)
Sex Hormone Binding: 33 nmol/L (ref 22–77)
Testosterone, Bioavailable: 128.4 ng/dL (ref 15.0–150.0)
Testosterone, Free: 63.8 pg/mL (ref 6.0–73.0)
Testosterone: 473 ng/dL (ref 250–827)

## 2021-09-20 NOTE — Progress Notes (Signed)
error 

## 2021-09-20 NOTE — Telephone Encounter (Signed)
Poke to patient informed him of his labs.  Unfortunately, he is in the donut hole with Jardiance and cannot afford it.  We will leave samples at the front desk while we will work on patient assistance for him.  Is interested in seeing urology to discuss possible placement of a penile pump for erectile dysfunction so I will refer him over there.

## 2021-09-21 ENCOUNTER — Telehealth: Payer: Self-pay | Admitting: Family Medicine

## 2021-09-21 NOTE — Telephone Encounter (Signed)
Call received from access nurse. The patient picked up a medication from the pharmacy that he thought was his allopurinol. He took a dose and subsequently felt nauseous and had bilateral back pain in his kidney area. Her then looked at the prescription and realized it was Isordil. The nurse called the pharmacy and they noted this was a new prescription for the patient. On review it appears this may have been sent in for his BP and he may have been prescribed this previously. Given his nausea and back pain possibly related to the medication I advised that the patient hold the medication and monitor his BP until his PCP is able to clarify this medication for him. Discussed that if his BP trends up over 160/100 he should contact us again for guidance. The patient also wanted to know about the possible increased allopurinol dose and I advised I would defer that to his PCP.

## 2021-09-26 NOTE — Telephone Encounter (Signed)
Pt states he feels better; believes that he was having previous symptoms due to taking meds on an empty stomach. Pt states he is still holding BP medications; BP on 09/25/21 was 144/80s. Patient unsure what medications he's taking at this point. Will call patient back when he returns home to review medications & decide plan of care at that time.

## 2021-09-26 NOTE — Telephone Encounter (Signed)
See PCP phone encounter to patient on 09/20/21.

## 2021-09-27 ENCOUNTER — Other Ambulatory Visit: Payer: Self-pay | Admitting: Adult Health

## 2021-09-27 MED ORDER — DAPAGLIFLOZIN PROPANEDIOL 5 MG PO TABS: 5.0000 mg | ORAL_TABLET | Freq: Every day | ORAL | 0 refills | Status: DC

## 2021-10-03 ENCOUNTER — Other Ambulatory Visit: Payer: Self-pay

## 2021-10-03 ENCOUNTER — Ambulatory Visit (INDEPENDENT_AMBULATORY_CARE_PROVIDER_SITE_OTHER): Payer: Medicare Other | Admitting: Adult Health

## 2021-10-03 ENCOUNTER — Encounter: Payer: Self-pay | Admitting: Adult Health

## 2021-10-03 VITALS — BP 150/90 | HR 78 | Temp 98.6°F | Ht 67.5 in | Wt 227.0 lb

## 2021-10-03 DIAGNOSIS — M545 Low back pain, unspecified: Secondary | ICD-10-CM | POA: Diagnosis not present

## 2021-10-03 LAB — POCT URINALYSIS DIPSTICK
Bilirubin, UA: NEGATIVE
Blood, UA: NEGATIVE
Glucose, UA: NEGATIVE
Ketones, UA: NEGATIVE
Leukocytes, UA: NEGATIVE
Nitrite, UA: NEGATIVE
Protein, UA: POSITIVE — AB
Spec Grav, UA: 1.015 (ref 1.010–1.025)
Urobilinogen, UA: 0.2 E.U./dL
pH, UA: 6 (ref 5.0–8.0)

## 2021-10-03 MED ORDER — CYCLOBENZAPRINE HCL 10 MG PO TABS
10.0000 mg | ORAL_TABLET | Freq: Three times a day (TID) | ORAL | 0 refills | Status: DC | PRN
Start: 2021-10-03 — End: 2022-01-09

## 2021-10-03 NOTE — Progress Notes (Signed)
Subjective:    Patient ID: Jared [name], male    DOB: 12-12-1946, 75 y.o.   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  He presents to the office today for right sided low back pain with intermittent pain to the left lower back. His pain has been present for three days. At home he has been using Tylenol/Motrin and a heating pad. His pain has improved.   Believes he got out of bed wrong as his pain started after getting out of bed.   Pain is worse with changes in position and twisting turning motions.   Denies UTI like symptoms. Has not noticed any blood in his urine   Review of Systems See HPI   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   GERD (gastroesophageal reflux disease)    Gout 07/26/2007   "on daily RX"  (12/15/2018)   HYPERCHOLESTEROLEMIA, BORDERLINE 01/16/2010   HYPERTENSION 07/26/2007   OSA (obstructive sleep apnea)    "haven't used mask in quite awhile" (12/15/2018)   OSTEOARTHRITIS 09/28/2007   PLANTAR FASCIITIS, BILATERAL 04/19/2010   PROSTATITIS, RECURRENT 11/08/2009   RENAL INSUFFICIENCY 07/26/2007   Sleep apnea    off Cpap    Stroke ([place]) 01/21/2013   "fully recovered" (12/15/2018)   Type II diabetes mellitus ([place])    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   URINARY FREQUENCY, CHRONIC 03/10/2008    Social History    Socioeconomic History   Marital status: Married    Spouse name: [place]   Number of children: 5   Years of education: 12   Highest education level: Not on file  Occupational History   Occupation: TESTOR    Employer: [employer]    Comment: retired  Tobacco Use   Smoking status: Never   Smokeless tobacco: Never  Vaping Use   Vaping Use: Never used  Substance and Sexual Activity   Alcohol use: Not Currently   Drug use: Never   Sexual activity: Not Currently  Other Topics Concern   Not on file  Social History Narrative   He has a part time job for delivering car parts   Married    4 children - All in [place]      He likes to go to car shows and shoot pool.    Social Determinants of Health   Financial Resource Strain: Medium Risk   Difficulty of Paying Living Expenses: Somewhat hard  Food Insecurity: Not on file  Transportation Needs: No Transportation Needs   Lack of Transportation (Medical): No   Lack of Transportation (Non-Medical): No  Physical Activity: Not on file  Stress: Not on file  Social Connections: Not on file  Intimate Partner Violence: Not on file    Past Surgical History:  Procedure Laterality Date   COLONOSCOPY     CYST EXCISION Left    "leg"   TONSILLECTOMY     UPPER GASTROINTESTINAL ENDOSCOPY     WRIST SURGERY Right    "cut bone out; for gout""    Family History  Problem Relation Age of  Onset   Heart disease Mother    Heart disease Father    CVA Father    Colon polyps Father    Diabetes Sister    Colon cancer Neg Hx    Esophageal cancer Neg Hx    Stomach cancer Neg Hx    Pancreatic cancer Neg Hx    Rectal cancer Neg Hx     Allergies  Allergen Reactions   Strawberry Extract Shortness Of Breath    Current Outpatient Medications on File Prior to Visit  Medication Sig Dispense Refill   allopurinol (ZYLOPRIM) 100 MG tablet TAKE 1 TABLET BY MOUTH TWICE A DAY 180 tablet 3   amLODipine (NORVASC) 10 MG tablet TAKE 1 TABLET BY MOUTH EVERY DAY  90 tablet 1   aspirin EC 81 MG tablet Take 81 mg by mouth daily. Swallow whole.     benzonatate (TESSALON PERLES) 100 MG capsule Take 1 capsule (100 mg total) by mouth 3 (three) times daily as needed. 20 capsule 0   blood glucose meter kit and supplies KIT Dispense based on patient and insurance preference. Use up to three times daily as directed. 1 each 0   Cholecalciferol (VITAMIN D-3) 125 MCG (5000 UT) TABS Take 1 tablet by mouth daily.     dapagliflozin propanediol (FARXIGA) 5 MG TABS tablet Take 1 tablet (5 mg total) by mouth daily before breakfast. 90 tablet 0   isosorbide dinitrate (ISORDIL) 20 MG tablet Take 1 tablet (20 mg total) by mouth 3 (three) times daily. 270 tablet 3   labetalol (NORMODYNE) 200 MG tablet TAKE 2 TABLETS (400 MG TOTAL) BY MOUTH 2 (TWO) TIMES DAILY. **DUE FOR YEARLY PHYSICAL** 360 tablet 0   losartan (COZAAR) 25 MG tablet TAKE 1 TABLET (25 MG TOTAL) BY MOUTH DAILY. 90 tablet 0   pantoprazole (PROTONIX) 40 MG tablet TAKE 1 TABLET BY MOUTH TWICE A DAY 180 tablet 3   simvastatin (ZOCOR) 20 MG tablet TAKE 1 TABLET BY MOUTH EVERY DAY AT 6 PM 90 tablet 3   hydrALAZINE (APRESOLINE) 25 MG tablet Take 1 tablet (25 mg total) by mouth 3 (three) times daily. 270 tablet 3   No current facility-administered medications on file prior to visit.    BP (!) 150/90   Pulse 78   Temp 98.6 F (37 C) (Oral)   Ht 5' 7.5" (1.715 m)   Wt 227 lb (103 kg)   SpO2 97%   BMI 35.03 kg/m       Objective:   Physical Exam Vitals and nursing note reviewed.  Constitutional:      Appearance: Normal appearance. He is obese.  Cardiovascular:     Rate and Rhythm: Normal rate and regular rhythm.     Pulses: Normal pulses.     Heart sounds: Normal heart sounds.  Pulmonary:     Effort: Pulmonary effort is normal.     Breath sounds: Normal breath sounds.  Abdominal:     General: Abdomen is flat. Bowel sounds are normal.     Tenderness: There is no abdominal tenderness. There is no right  CVA tenderness or left CVA tenderness.  Musculoskeletal:       Back:  Skin:    Capillary Refill: Capillary refill takes less than 2 seconds.  Neurological:     General: No focal deficit present.     Mental Status: He is alert and oriented to person, place, and time.  Psychiatric:        Mood and Affect: Mood normal.  Behavior: Behavior normal.        Thought Content: Thought content normal.        Judgment: Judgment normal.      Assessment & Plan:  1. Acute right-sided low back pain without sciatica - Negative UA. Doubt UTI or kidney stones. Tender to low back with palpation. Likely muscle strain. Will prescribe flexeril and advised heating pad. Stay away from NSAIDS d/t CKD.  - Follow up as needed - cyclobenzaprine (FLEXERIL) 10 MG tablet; Take 1 tablet (10 mg total) by mouth 3 (three) times daily as needed for muscle spasms.  Dispense: 10 tablet; Refill: 0 - POC Urinalysis Dipstick  Dorothyann Peng, NP

## 2021-10-17 ENCOUNTER — Telehealth: Payer: Self-pay | Admitting: Pharmacist

## 2021-10-17 NOTE — Progress Notes (Signed)
    Chronic Care Management Pharmacy Assistant   Name: Jared [name]  MRN: [id number] DOB: [birth year]  Reason for Encounter: Follow up patient assistance for Jardiance.  On 09/05/2021 note Jardiance was on patients med list, as of today it is no longer on his med list.  Patient was started on Farxiga 09/27/2021.  Medications: Outpatient Encounter Medications as of 10/17/2021  Medication Sig   allopurinol (ZYLOPRIM) 100 MG tablet TAKE 1 TABLET BY MOUTH TWICE A DAY   amLODipine (NORVASC) 10 MG tablet TAKE 1 TABLET BY MOUTH EVERY DAY   aspirin EC 81 MG tablet Take 81 mg by mouth daily. Swallow whole.   benzonatate (TESSALON PERLES) 100 MG capsule Take 1 capsule (100 mg total) by mouth 3 (three) times daily as needed.   blood glucose meter kit and supplies KIT Dispense based on patient and insurance preference. Use up to three times daily as directed.   Cholecalciferol (VITAMIN D-3) 125 MCG (5000 UT) TABS Take 1 tablet by mouth daily.   cyclobenzaprine (FLEXERIL) 10 MG tablet Take 1 tablet (10 mg total) by mouth 3 (three) times daily as needed for muscle spasms.   dapagliflozin propanediol (FARXIGA) 5 MG TABS tablet Take 1 tablet (5 mg total) by mouth daily before breakfast.   hydrALAZINE (APRESOLINE) 25 MG tablet Take 1 tablet (25 mg total) by mouth 3 (three) times daily.   isosorbide dinitrate (ISORDIL) 20 MG tablet Take 1 tablet (20 mg total) by mouth 3 (three) times daily.   labetalol (NORMODYNE) 200 MG tablet TAKE 2 TABLETS (400 MG TOTAL) BY MOUTH 2 (TWO) TIMES DAILY. **DUE FOR YEARLY PHYSICAL**   losartan (COZAAR) 25 MG tablet TAKE 1 TABLET (25 MG TOTAL) BY MOUTH DAILY.   pantoprazole (PROTONIX) 40 MG tablet TAKE 1 TABLET BY MOUTH TWICE A DAY   simvastatin (ZOCOR) 20 MG tablet TAKE 1 TABLET BY MOUTH EVERY DAY AT 6 PM   No facility-administered encounter medications on file as of 10/17/2021.    Care Gaps: AWV - previous message sent to [name] to schedule. Zoster Vaccine -  never done Tetanus/TDAP - overdue 12/23/2015 Covid 19 vaccine booster 4 - overdue 04/06/2021 Flu vaccine - due  Star Rating Drugs: Losartan $RemoveBeforeD'25mg'[id number]$  - [facility] Simvastatin $RemoveBeforeD'20mg'[id number]$  - [facility] Farxiga 5 mg - last filled by Elmarie Shiley MD on 10/15/20 at [facility] Pharmacist Assistant [phone]

## 2021-10-21 ENCOUNTER — Telehealth: Payer: Self-pay | Admitting: Pharmacist

## 2021-10-21 NOTE — Chronic Care Management (AMB) (Signed)
    Chronic Care Management Pharmacy Assistant   Name: Jared [name]  MRN: [id number] DOB: May 22, 1946  Reason for Encounter: Follow up [place] patient assistance. Spoke with Ronnie at [facility] and Me.  Patient has been approved through Dec 2022 and through Dec 2023, he is enrolled for both 2022 and 2023. Shipping to patient.    Medications: Outpatient Encounter Medications as of 10/21/2021  Medication Sig   allopurinol (ZYLOPRIM) 100 MG tablet TAKE 1 TABLET BY MOUTH TWICE A DAY   amLODipine (NORVASC) 10 MG tablet TAKE 1 TABLET BY MOUTH EVERY DAY   aspirin EC 81 MG tablet Take 81 mg by mouth daily. Swallow whole.   benzonatate (TESSALON PERLES) 100 MG capsule Take 1 capsule (100 mg total) by mouth 3 (three) times daily as needed.   blood glucose meter kit and supplies KIT Dispense based on patient and insurance preference. Use up to three times daily as directed.   Cholecalciferol (VITAMIN D-3) 125 MCG (5000 UT) TABS Take 1 tablet by mouth daily.   cyclobenzaprine (FLEXERIL) 10 MG tablet Take 1 tablet (10 mg total) by mouth 3 (three) times daily as needed for muscle spasms.   dapagliflozin propanediol (FARXIGA) 5 MG TABS tablet Take 1 tablet (5 mg total) by mouth daily before breakfast.   hydrALAZINE (APRESOLINE) 25 MG tablet Take 1 tablet (25 mg total) by mouth 3 (three) times daily.   isosorbide dinitrate (ISORDIL) 20 MG tablet Take 1 tablet (20 mg total) by mouth 3 (three) times daily.   labetalol (NORMODYNE) 200 MG tablet TAKE 2 TABLETS (400 MG TOTAL) BY MOUTH 2 (TWO) TIMES DAILY. **DUE FOR YEARLY PHYSICAL**   losartan (COZAAR) 25 MG tablet TAKE 1 TABLET (25 MG TOTAL) BY MOUTH DAILY.   pantoprazole (PROTONIX) 40 MG tablet TAKE 1 TABLET BY MOUTH TWICE A DAY   simvastatin (ZOCOR) 20 MG tablet TAKE 1 TABLET BY MOUTH EVERY DAY AT 6 PM   No facility-administered encounter medications on file as of 10/21/2021.    Care Gaps: AWV - message sent to [name] to schedule. Tetanus/TDAP -  overdue 12/23/2015 Covid 19 vaccine booster 4 - overdue 04/06/2021  Star Rating Drugs: Losartan $RemoveBeforeD'25mg'[id number]$  - [facility] Simvastatin $RemoveBeforeD'20mg'[id number]$  - last filled 09/19/2021 at [facility] Pharmacist Assistant [phone]

## 2021-12-05 ENCOUNTER — Telehealth: Payer: Self-pay | Admitting: Pharmacist

## 2021-12-05 NOTE — Chronic Care Management (AMB) (Signed)
° ° °  Chronic Care Management Pharmacy Assistant   Name: Jared [name]  MRN: [id number] DOB: Sep 24, 1946  Reason for Encounter: Follow up [name] patient assistance, spoke with [name] at [place], [name] approved through Dec 2023. No current refill on file.  12/10/21 Message sent to [name] to send new prescription.    Care Gaps: AWV - message sent to [name] to schedule. Last BP - 150/90 on 10/03/2021 Tetanus/TDAP - overdue 12/23/2015 Covid 19 vaccine  - overdue Shingrix - overdue  Star Rating Drugs: Losartan 25mg  - [facility] Sim[facility] 20mg  - last filled 09/19/2021 at [place] Pharmacist Assistant [phone]

## 2021-12-10 ENCOUNTER — Other Ambulatory Visit: Payer: Self-pay

## 2021-12-10 MED ORDER — DAPAGLIFLOZIN PROPANEDIOL 5 MG PO TABS: 5.0000 mg | ORAL_TABLET | Freq: Every day | ORAL | 3 refills | Status: DC

## 2021-12-12 ENCOUNTER — Telehealth: Payer: Self-pay | Admitting: Pharmacist

## 2021-12-12 NOTE — Chronic Care Management (AMB) (Signed)
° ° °  Chronic Care Management Pharmacy Assistant   Name: Jared [name]  MRN: [id number] DOB: 1946-10-23  12/17/2021 APPOINTMENT REMINDER  Lennon E Mun was reminded to have all medications, supplements and any blood glucose and blood pressure readings available for review with [name], Pharm. D, at his telephone visit on 12/17/2021 at 1:00.   Questions: Have you had any recent office visit or specialist visit outside of [facility]? Patient denies any visit outside of [facility].  Are there any concerns you would like to discuss during your office visit? Patient denies any concerns at this time.  Are you having any problems obtaining your medications?  Patient denies any issues getting medications.  If patient has any PAP medications ask if they are having any problems getting their PAP medication or refill? Patient is getting Farxiga through PAP, he denies any issues.  Care Gaps: AWV - message sent to [name] to schedule. Last BP - 150/90 on 10/03/2021 Tetanus/TDAP - overdue 12/23/2015 Covid 19 vaccine  - overdue Shingrix - overdue  Star Rating Drug: Losartan 25mg  - [facility] 20[facility]  - last filled 09/19/2021 at [place] Pharmacist Assistant [phone]

## 2021-12-14 ENCOUNTER — Other Ambulatory Visit: Payer: Self-pay | Admitting: Adult Health

## 2021-12-14 DIAGNOSIS — I10 Essential (primary) hypertension: Secondary | ICD-10-CM

## 2021-12-16 NOTE — Progress Notes (Signed)
Chronic Care Management Pharmacy Note  12/17/2021 Name:  Jared [name] MRN:  [id number] DOB:  1946-11-22  Summary: Uric acid not at goal < 6 and pt reports recent gout flare ups LDL not at goal < 70  Recommendations/Changes made from today's visit: -Recommended for pt to take hydralazine TID as prescribed -Recommend increasing to high intensity atorvastatin 40 mg  -Recommend increasing allopurinol to target uric acid < 6   Plan: Follow up BP assessment in 4 weeks Scheduled PCP visit for itchy legs   Subjective: Jared [name] is an 75 y.o. year old male who is a primary patient of Dorothyann Peng, NP.  The CCM team was consulted for assistance with disease management and care coordination needs.    Engaged with patient by telephone for follow up visit in response to provider referral for pharmacy case management and/or care coordination services.   Consent to Services:  The patient was given information about Chronic Care Management services, agreed to services, and gave verbal consent prior to initiation of services.  Please see initial visit note for detailed documentation.   Patient Care Team: Dorothyann Peng, NP as PCP - General (Family Medicine) Lenis Dickinson, MD (Inactive) as Referring Physician (Neurology) Ralene Bathe, MD as Consulting Physician (Ophthalmology) Elmarie Shiley, MD as Consulting Physician (Nephrology) Viona Gilmore, [facility] as Pharmacist (Pharmacist)  Recent office visits: 10/03/21 Dorothyann Peng, NP: Patient presented for back pain. Prescribed cyclobenzaprine.  09/19/21 Dorothyann Peng, NP: Patient presented for annual exam. LDL elevated, uric acid elevated.  08/13/2021 Colin Benton DO (PCP) - Patient was seen via video visit for positive Covid. Started on Benzonate 179m three times daily prn and Molnupiavir 8046mtwice daily. No follow up.  07/03/21 CoDorothyann PengNP: Patient presented for gout attack. Prescribed prednisone taper. Plan for repeat uric  acid level.  Recent consult visits: 10/15/20 JaElmarie ShileyMD (nephrology): Patient presented for follow up. Unable to access notes.  03/21/20 JoScarlette ShortsMD (gastro): Patient presented for dysphagia and GERD follow up. No changes made. Continued pantoprazole 40 mg BID.  Hospital visits: None in previous 6 months  Objective:  Lab Results  Component Value Date   CREATININE 2.48 (H) 09/19/2021   BUN 26 (H) 09/19/2021   GFR 25.21 (L) 09/19/2021   GFRNONAA 20 (L) 08/23/2020   GFRAA 23 (L) 08/23/2020   NA 142 09/19/2021   K 4.4 09/19/2021   CALCIUM 9.6 09/19/2021   CO2 25 09/19/2021   GLUCOSE 108 (H) 09/19/2021    Lab Results  Component Value Date/Time   HGBA1C 6.2 09/19/2021 11:47 AM   HGBA1C 5.6 06/25/2021 01:09 PM   HGBA1C 5.7 (H) 08/23/2020 02:21 PM   HGBA1C 5.8 02/22/2019 03:31 PM   HGBA1C 5.5 08/24/2018 02:43 PM   GFR 25.21 (L) 09/19/2021 11:47 AM   GFR 36.49 (L) 06/30/2019 10:29 AM   MICROALBUR 0.8 06/27/2014 04:05 PM    Last diabetic Eye exam:  Lab Results  Component Value Date/Time   HMDIABEYEEXA No Retinopathy 03/17/2018 12:00 AM    Last diabetic Foot exam: No results found for: HMDIABFOOTEX   Lab Results  Component Value Date   CHOL 158 09/19/2021   HDL 37.50 (L) 09/19/2021   LDLCALC 96 09/19/2021   LDLDIRECT 85.1 06/03/2013   TRIG 124.0 09/19/2021   CHOLHDL 4 09/19/2021    Hepatic Function Latest Ref Rng & Units 09/19/2021 08/23/2020 06/30/2019  Total Protein 6.0 - 8.3 g/dL 7.5 7.1 7.6  Albumin 3.5 - 5.2 g/dL 4.4 -  4.6  AST 0 - 37 U/L '21 21 20  ' ALT 0 - 53 U/L '16 15 13  ' Alk Phosphatase 39 - 117 U/L 72 - 95  Total Bilirubin 0.2 - 1.2 mg/dL 0.5 0.5 0.5  Bilirubin, Direct 0.0 - 0.3 mg/dL - - -    Lab Results  Component Value Date/Time   TSH 0.95 09/19/2021 11:47 AM   TSH 0.96 08/23/2020 02:21 PM    CBC Latest Ref Rng & Units 09/19/2021 08/23/2020 06/30/2019  WBC 4.0 - 10.5 K/uL 5.0 5.8 6.5  Hemoglobin 13.0 - 17.0 g/dL 13.3 12.7(L) 12.4(L)  Hematocrit 39.0  - 52.0 % 40.1 36.8(L) 37.1(L)  Platelets 150.0 - 400.0 K/uL 196.0 229 243.0    Lab Results  Component Value Date/Time   VD25OH 29.5 10/05/2018 12:00 AM   VD25OH 27.4 09/13/2018 12:00 AM    Clinical ASCVD: Yes  The 10-year ASCVD risk score (Arnett DK, et al., 2019) is: 45.9%   Values used to calculate the score:     Age: 31 years     Sex: Male     Is Non-Hispanic African American: Yes     Diabetic: Yes     Tobacco smoker: No     Systolic Blood Pressure: 409 mmHg     Is BP treated: Yes     HDL Cholesterol: 37.5 mg/dL     Total Cholesterol: 158 mg/dL    Depression screen [facility] 2/9 09/19/2021 08/23/2020 12/14/2018  Decreased Interest 0 2 0  Down, Depressed, Hopeless 0 0 1  PHQ - 2 Score 0 2 1  Some recent data might be hidden      Social History   Tobacco Use  Smoking Status Never  Smokeless Tobacco Never   BP Readings from Last 3 Encounters:  10/03/21 (!) 150/90  09/19/21 (!) 142/88  07/03/21 122/84   Pulse Readings from Last 3 Encounters:  10/03/21 78  09/19/21 62  07/03/21 77   Wt Readings from Last 3 Encounters:  10/03/21 227 lb (103 kg)  09/19/21 222 lb (100.7 kg)  08/13/21 223 lb (101.2 kg)   BMI Readings from Last 3 Encounters:  10/03/21 35.03 kg/m  09/19/21 34.26 kg/m  08/13/21 33.91 kg/m    Assessment/Interventions: Review of patient past medical history, allergies, medications, health status, including review of consultants reports, laboratory and other test data, was performed as part of comprehensive evaluation and provision of chronic care management services.   SDOH:  (Social Determinants of Health) assessments and interventions performed: Yes    SDOH Screenings   Alcohol Screen: Not on file  Depression (PHQ2-9): Low Risk    PHQ-2 Score: 0  Financial Resource Strain: Medium Risk   Difficulty of Paying Living Expenses: Somewhat hard  Food Insecurity: Not on file  Housing: Not on file  Physical Activity: Not on file  Social Connections:  Not on file  Stress: Not on file  Tobacco Use: Low Risk    Smoking Tobacco Use: Never   Smokeless Tobacco Use: Never   Passive Exposure: Not on file  Transportation Needs: No Transportation Needs   Lack of Transportation (Medical): No   Lack of Transportation (Non-Medical): No    CCM Care Plan  Allergies  Allergen Reactions   Strawberry Extract Shortness Of Breath    Medications Reviewed Today     Reviewed by Franco Collet, CMA (Certified Medical Assistant) on 10/03/21 at [phone]  Med List Status: <None>   Medication Order Taking? Sig Documenting Provider Last Dose Status Informant  allopurinol (ZYLOPRIM) 100 MG tablet [id number] Yes TAKE 1 TABLET BY MOUTH TWICE A DAY Nafziger, Cory, NP Taking Active   amLODipine (NORVASC) 10 MG tablet [id number] Yes TAKE 1 TABLET BY MOUTH EVERY DAY Nafziger, Cory, NP Taking Active   aspirin EC 81 MG tablet [id number] Yes Take 81 mg by mouth daily. Swallow whole. [provider] Taking Active   benzonatate (TESSALON PERLES) 100 MG capsule [id number] Yes Take 1 capsule (100 mg total) by mouth 3 (three) times daily as needed. Lucretia Kern, DO Taking Active   blood glucose meter kit and supplies KIT [id number] Yes Dispense based on patient and insurance preference. Use up to three times daily as directed. Nafziger, Tommi Rumps, NP Taking Active   Cholecalciferol (VITAMIN D-3) 125 MCG (5000 UT) TABS [id number] Yes Take 1 tablet by mouth daily. [provider] Taking Active   dapagliflozin propanediol (FARXIGA) 5 MG TABS tablet [id number] Yes Take 1 tablet (5 mg total) by mouth daily before breakfast. Dorothyann Peng, NP Taking Active   hydrALAZINE (APRESOLINE) 25 MG tablet [id number]  Take 1 tablet (25 mg total) by mouth 3 (three) times daily. Dorothyann Peng, NP  Expired 06/17/21 2359   isosorbide dinitrate (ISORDIL) 20 MG tablet [id number] Yes Take 1 tablet (20 mg total) by mouth 3 (three) times daily. Nafziger, Tommi Rumps, NP Taking Active   labetalol  (NORMODYNE) 200 MG tablet [id number] Yes TAKE 2 TABLETS (400 MG TOTAL) BY MOUTH 2 (TWO) TIMES DAILY. **DUE FOR YEARLY PHYSICAL** Nafziger, Tommi Rumps, NP Taking Active   losartan (COZAAR) 25 MG tablet [id number] Yes TAKE 1 TABLET (25 MG TOTAL) BY MOUTH DAILY. Nafziger, Tommi Rumps, NP Taking Active   pantoprazole (PROTONIX) 40 MG tablet [id number] Yes TAKE 1 TABLET BY MOUTH TWICE A DAY Irene Shipper, MD Taking Active   simvastatin (ZOCOR) 20 MG tablet [id number] Yes TAKE 1 TABLET BY MOUTH EVERY DAY AT 6 PM Nafziger, Tommi Rumps, NP Taking Active             Patient Active Problem List   Diagnosis Date Noted   Stage 3b chronic kidney disease ([place]) 08/23/2020   Hypertensive emergency 12/14/2018   AKI (acute kidney injury) ([place])    Hyperlipidemia 05/26/2015   OSA on CPAP 11/13/2013   CVA (cerebral infarction) 01/21/2013   Diabetes mellitus ([place]) 01/21/2013   Knee pain, bilateral 02/19/2011   PLANTAR FASCIITIS, BILATERAL 04/19/2010   UNSPECIFIED DISORDER OF LIPOID METABOLISM 11/08/2009   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   PROSTATITIS, RECURRENT 11/08/2009   URINARY FREQUENCY, CHRONIC 03/10/2008   OSTEOARTHRITIS 09/28/2007   GOUT 07/26/2007   Essential hypertension 07/26/2007   Disorder resulting from impaired renal function 07/26/2007    Immunization History  Administered Date(s) Administered   Fluad Quad(high Dose 65+) 09/16/2019, 09/16/2019, 10/20/2020, 09/19/2021   Influenza Whole 12/22/2005, 10/02/2008, 11/08/2009   Influenza, High Dose Seasonal PF 01/25/2018, 12/01/2018   Influenza,inj,Quad PF,6+ Mos 09/28/2013   Moderna Sars-Covid-2 Vaccination 02/03/2020, 03/02/2020   PFIZER(Purple Top)SARS-COV-2 Vaccination 12/06/2020   Pneumococcal Conjugate-13 10/23/2014   Pneumococcal Polysaccharide-23 06/02/2016   Td 12/22/2005   Zoster Recombinat (Shingrix) 09/23/2021   Patient reports his biggest concern right now is with his legs being itchy. He said this started last week and isn't  sure what the cause is. He reported the only change with his medications have been that the isosorbide is now a green tablet and it was not prior to that. He denies any swelling or redness.  Patient reports he has also had some symptoms of  gout lately. Discussed how this could be related to increased uric acid level and educated on foods that can increase his risk.  Conditions to be addressed/monitored:  Hypertension, Hyperlipidemia, Diabetes, GERD, Chronic Kidney Disease, Osteoarthritis and Gout  Conditions addressed this encounter: Hyperlipidemia, gout, hypertension  Care Plan : CCM Pharmacy Care Plan  Updates made by Viona Gilmore, [place] since 12/17/2021 12:00 AM     Problem: Problem: Hypertension, Hyperlipidemia, Diabetes, GERD, Chronic Kidney Disease, Osteoarthritis and Gout      Long-Range Goal: Patient-Specific Goal   Start Date: 03/14/2021  Expected End Date: 03/14/2022  Recent Progress: On track  Priority: High  Note:   Current Barriers:  Unable to independently monitor therapeutic efficacy Unable to achieve control of cholesterol  Unable to maintain control of gout Suboptimal therapeutic regimen for cholesterol   Pharmacist Clinical Goal(s):  Patient will verbalize ability to afford treatment regimen achieve adherence to monitoring guidelines and medication adherence to achieve therapeutic efficacy achieve control of cholesterol as evidenced by next lipid panel  through collaboration with PharmD and provider.   Interventions: 1:1 collaboration with Dorothyann Peng, NP regarding development and update of comprehensive plan of care as evidenced by provider attestation and co-signature Inter-disciplinary care team collaboration (see longitudinal plan of care) Comprehensive medication review performed; medication list updated in electronic medical record  Hypertension (BP goal <130/80) -Not ideally controlled -Current treatment: Lisinopril 10 mg 1 tablet twice  daily Labetalol 200 mg 2 tablets twice daily Isosorbide 20 mg 1 tablet three times daily - taking twice daily Hydralazine 25 mg 1 tablet three times daily - taking twice daily Amlodipine 10 mg 1 tablet daily -Medications previously tried: n/a  -Current home readings: no recent readings; not checking consistently at home as arm cuff broke -Current dietary habits: doesn't add salt but eats out some; has some bacon and ham occasionally -Current exercise habits: no structured exercise -Denies hypotensive/hypertensive symptoms -Educated on Daily salt intake goal < 2300 mg; Exercise goal of 150 minutes per week; Importance of home blood pressure monitoring; Proper BP monitoring technique; -Counseled to monitor BP at home weekly, document, and provide log at future appointments -Counseled on diet and exercise extensively Recommended to continue current medication Recommended taking hydralazine three times daily as prescribed.  Hyperlipidemia: (LDL goal < 70) -Uncontrolled -Current treatment: Simvastatin 20 mg 1 tablet daily at 6 pm -Medications previously tried: none -Current dietary patterns: eats vegetables 2-3 times a week and a bananas every day but has cut out junk food; does admit to eating fried foods -Current exercise habits: no structured exercise -Educated on Cholesterol goals;  Benefits of statin for ASCVD risk reduction; Importance of limiting foods high in cholesterol; Exercise goal of 150 minutes per week; -Recommended increasing to atorvastatin 40 mg to lower LDL.  Diabetes (A1c goal <7%) -Controlled -Current medications: Farxiga 5 mg 1 tablet daily -Medications previously tried: Geneticist, molecular (cost), metformin (no longer needed) -Current home glucose readings - does not have a meter at home fasting glucose: n/a post prandial glucose: n/a -Denies hypoglycemic/hyperglycemic symptoms -Current meal patterns:  breakfast: n/a  lunch: n/a  dinner: n/a snacks: n/a drinks:  n/a -Current exercise: no structured exercise -Educated on A1c and blood sugar goals; Complications of diabetes including kidney damage, retinal damage, and cardiovascular disease; Exercise goal of 150 minutes per week; Prevention and management of hypoglycemic episodes; Carbohydrate counting and/or plate method -Counseled to check feet daily and get yearly eye exams -Counseled on diet and exercise extensively Recommended to continue current medication  History of stroke (Goal: prevent future strokes) -Controlled -Current treatment  Simvastatin 20 mg 1 tablet daily at bedtime Aspirin 81 mg 1 tablet daily -Medications previously tried: none  -Recommended to continue current medication Counseled on monitoring for bleeding with use of aspirin  Gout (Goal: uric acid < 6 and prevent flare ups) -Uncontrolled -Current treatment  Allopurinol 100 mg 1 tablet twice daily -Medications previously tried: prednisone (flare up), colchicine (rx ran out)  -Recommended to continue current medication Counseled on low purine diet foods and drinks Recommended increasing allopurinol or repeat uric acid level.  CKD (Goal: protect kidneys) -Controlled -Current treatment  Farxiga 5 mg daily -Medications previously tried: [place] (cost)  -Recommended to continue current medication Counseled on side effects and patient assistance opportunities  Dysphagia (Goal: minimize symptoms) -Controlled -Current treatment  pantoprazole 40 mg 1 tablet twice daily -Medications previously tried: none  -Counseled on non-pharmacologic management of symptoms such as elevating the head of your bed, avoiding eating 2-3 hours before bed, avoiding triggering foods such as acidic, spicy, or fatty foods, eating smaller meals, and wearing clothes that are loose around the waist  Health Maintenance -Vaccine gaps: tetanus, shingles -Current therapy:  Multivitamin 1 tablet daily -Educated on Cost vs benefit of each product  must be carefully weighed by individual consumer -Patient is satisfied with current therapy and denies issues -Recommended to continue current medication  Patient Goals/Self-Care Activities Patient will:  - take medications as prescribed check glucose as needed, document, and provide at future appointments check blood pressure weekly, document, and provide at future appointments target a minimum of 150 minutes of moderate intensity exercise weekly  Follow Up Plan: Telephone follow up appointment with care management team member scheduled for: 4 months      Medication Assistance: None required.  Patient affirms current coverage meets needs.  Compliance/Adherence/Medication fill history: Care Gaps: Shingrix, tetanus, COVID booster A1c: 6.2% (09/19/21) Last BP - 150/90 on 10/03/2021  Star-Rating Drugs: Losartan 54m - [facility] Simvastatin 283m- [facility]  [facility]  [id number] [place]one: [phone]: [phone] [place] [place]: [phone]: [phone] [place]: [phone]: [phone] pill box? No - lines up medications Pt endorses 99% compliance (misses once a month)  We discussed: Current pharmacy is preferred with insurance plan and patient is satisfied with pharmacy services Patient decided to: Continue current medication management strategy  Care Plan and Follow Up Patient Decision:  Patient agrees to Care Plan and Follow-up.  Plan: Telephone follow up appointment with care management team member scheduled for:  4 months  MadJeni SallesPharmD BCARidgelandarmacist [place]arcel[phone]

## 2021-12-17 ENCOUNTER — Ambulatory Visit (INDEPENDENT_AMBULATORY_CARE_PROVIDER_SITE_OTHER): Payer: Medicare Other | Admitting: Pharmacist

## 2021-12-17 DIAGNOSIS — M109 Gout, unspecified: Secondary | ICD-10-CM

## 2021-12-17 DIAGNOSIS — E782 Mixed hyperlipidemia: Secondary | ICD-10-CM

## 2021-12-17 DIAGNOSIS — I10 Essential (primary) hypertension: Secondary | ICD-10-CM

## 2021-12-21 DIAGNOSIS — I10 Essential (primary) hypertension: Secondary | ICD-10-CM

## 2021-12-21 DIAGNOSIS — E782 Mixed hyperlipidemia: Secondary | ICD-10-CM | POA: Diagnosis not present

## 2021-12-24 ENCOUNTER — Ambulatory Visit: Payer: Medicare Other | Admitting: Adult Health

## 2021-12-24 NOTE — Progress Notes (Deleted)
Subjective:    Patient ID: ERI PLATTEN, male    DOB: 1946/08/19, 76 y.o.   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  Presents to the office today for an acute issue of "itchy legs".  Reports that he started noticing itching on his legs suddenly over the last 2 weeks.  Has not had any medication changes except for the color of his isosorbide changed during his last refill.  Our clinical pharmacist also has some recommendations after evaluating the patient.  Recommend rechecking uric acid level and titrating allopurinol to a uric acid level less than 6 as well as switching his cholesterol medication from simvastatin to atorvastatin due to decreased kidney function and LDL not at goal.   Review of Systems See HPI   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   GERD (gastroesophageal reflux disease)    Gout 07/26/2007   "on daily RX"  (12/15/2018)   HYPERCHOLESTEROLEMIA, BORDERLINE 01/16/2010   HYPERTENSION 07/26/2007   OSA (obstructive sleep apnea)    "haven't used mask in quite awhile" (12/15/2018)   OSTEOARTHRITIS 09/28/2007   PLANTAR FASCIITIS, BILATERAL 04/19/2010   PROSTATITIS, RECURRENT 11/08/2009   RENAL INSUFFICIENCY 07/26/2007   Sleep apnea    off Cpap    Stroke ([place]) 01/21/2013   "fully recovered" (12/15/2018)   Type II diabetes mellitus ([place])     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   URINARY FREQUENCY, CHRONIC 03/10/2008    Social History   Socioeconomic History   Marital status: Married    Spouse name: [place]   Number of children: 5   Years of education: 12   Highest education level: Not on file  Occupational History   Occupation: TESTOR    Employer: [employer]    Comment: retired  Tobacco Use   Smoking status: Never   Smokeless tobacco: Never  Vaping Use   Vaping Use: Never used  Substance and Sexual Activity   Alcohol use: Not Currently   Drug use: Never   Sexual activity: Not Currently  Other Topics Concern   Not on file  Social History Narrative   He has a part time job for delivering car parts   Married    4 children - All in [place]      He likes to go to car shows and shoot pool.    Social Determinants of Health   Financial Resource Strain: Medium Risk   Difficulty of Paying Living Expenses: Somewhat hard  Food Insecurity: Not on file  Transportation Needs: No Transportation Needs   Lack of Transportation (Medical): No   Lack of Transportation (Non-Medical): No  Physical Activity: Not on file  Stress: Not on file  Social Connections: Not on file  Intimate Partner Violence: Not on file    Past Surgical History:  Procedure Laterality Date   COLONOSCOPY     CYST EXCISION Left    "leg"   TONSILLECTOMY     UPPER GASTROINTESTINAL ENDOSCOPY     WRIST SURGERY Right    "cut bone out; for gout""  Family History  Problem Relation Age of Onset   Heart disease Mother    Heart disease Father    CVA Father    Colon polyps Father    Diabetes Sister    Colon cancer Neg Hx    Esophageal cancer Neg Hx    Stomach cancer Neg Hx    Pancreatic cancer Neg Hx    Rectal cancer Neg Hx     Allergies  Allergen Reactions   Strawberry Extract Shortness Of Breath    Current Outpatient Medications on File Prior to Visit  Medication Sig Dispense Refill   allopurinol (ZYLOPRIM) 100 MG tablet TAKE  1 TABLET BY MOUTH TWICE A DAY 180 tablet 3   amLODipine (NORVASC) 10 MG tablet TAKE 1 TABLET BY MOUTH EVERY DAY 90 tablet 1   aspirin EC 81 MG tablet Take 81 mg by mouth daily. Swallow whole.     blood glucose meter kit and supplies KIT Dispense based on patient and insurance preference. Use up to three times daily as directed. 1 each 0   Cholecalciferol (VITAMIN D-3) 125 MCG (5000 UT) TABS Take 1 tablet by mouth daily.     cyclobenzaprine (FLEXERIL) 10 MG tablet Take 1 tablet (10 mg total) by mouth 3 (three) times daily as needed for muscle spasms. (Patient not taking: Reported on 12/17/2021) 10 tablet 0   dapagliflozin propanediol (FARXIGA) 5 MG TABS tablet Take 1 tablet (5 mg total) by mouth daily before breakfast. 90 tablet 3   hydrALAZINE (APRESOLINE) 25 MG tablet Take 1 tablet (25 mg total) by mouth 3 (three) times daily. (Patient not taking: Reported on 12/17/2021) 270 tablet 3   isosorbide dinitrate (ISORDIL) 20 MG tablet Take 1 tablet (20 mg total) by mouth 3 (three) times daily. 270 tablet 3   labetalol (NORMODYNE) 200 MG tablet Take 2 tablets (400 mg total) by mouth 2 (two) times daily. 360 tablet 2   losartan (COZAAR) 25 MG tablet TAKE 1 TABLET (25 MG TOTAL) BY MOUTH DAILY. 90 tablet 0   pantoprazole (PROTONIX) 40 MG tablet TAKE 1 TABLET BY MOUTH TWICE A DAY 180 tablet 3   simvastatin (ZOCOR) 20 MG tablet TAKE 1 TABLET BY MOUTH EVERY DAY AT 6 PM 90 tablet 3   No current facility-administered medications on file prior to visit.    There were no vitals taken for this visit.      Objective:   Physical Exam        Assessment & Plan:

## 2022-01-02 ENCOUNTER — Telehealth: Payer: Self-pay | Admitting: Adult Health

## 2022-01-02 NOTE — Telephone Encounter (Signed)
Please advise 

## 2022-01-02 NOTE — Telephone Encounter (Signed)
Pt is calling and would like a work note .Pt had gout flare up on 12-29-2021 and went to work on 12-30-2021. Pt would like a work note for one day only 01-01-2022 and will return to work on 01-03-2022. Pt has a history of gout and would like to pick up work note today. Pt only works 3 days

## 2022-01-03 NOTE — Telephone Encounter (Signed)
Pt notified of message below and verbalized understanding. Letter printed for per pt request and will be placed in front office filing cabinet.

## 2022-01-09 ENCOUNTER — Ambulatory Visit (INDEPENDENT_AMBULATORY_CARE_PROVIDER_SITE_OTHER): Payer: Medicare Other | Admitting: Adult Health

## 2022-01-09 VITALS — BP 132/82 | HR 69 | Temp 98.5°F | Ht 67.5 in | Wt 221.0 lb

## 2022-01-09 DIAGNOSIS — M109 Gout, unspecified: Secondary | ICD-10-CM

## 2022-01-09 DIAGNOSIS — I10 Essential (primary) hypertension: Secondary | ICD-10-CM | POA: Diagnosis not present

## 2022-01-09 DIAGNOSIS — E1121 Type 2 diabetes mellitus with diabetic nephropathy: Secondary | ICD-10-CM | POA: Diagnosis not present

## 2022-01-09 DIAGNOSIS — E782 Mixed hyperlipidemia: Secondary | ICD-10-CM

## 2022-01-09 LAB — URIC ACID: Uric Acid, Serum: 6.2 mg/dL (ref 4.0–7.8)

## 2022-01-09 LAB — COMPREHENSIVE METABOLIC PANEL
ALT: 16 U/L (ref 0–53)
AST: 18 U/L (ref 0–37)
Albumin: 4.2 g/dL (ref 3.5–5.2)
Alkaline Phosphatase: 78 U/L (ref 39–117)
BUN: 26 mg/dL — ABNORMAL HIGH (ref 6–23)
CO2: 25 mEq/L (ref 19–32)
Calcium: 9.1 mg/dL (ref 8.4–10.5)
Chloride: 106 mEq/L (ref 96–112)
Creatinine, Ser: 2.77 mg/dL — ABNORMAL HIGH (ref 0.40–1.50)
GFR: 22.03 mL/min — ABNORMAL LOW (ref >60.00)
Glucose, Bld: 144 mg/dL — ABNORMAL HIGH (ref 70–99)
Potassium: 3.9 mEq/L (ref 3.5–5.1)
Sodium: 141 mEq/L (ref 135–145)
Total Bilirubin: 0.5 mg/dL (ref 0.2–1.2)
Total Protein: 7.6 g/dL (ref 6.0–8.3)

## 2022-01-09 LAB — POCT GLYCOSYLATED HEMOGLOBIN (HGB A1C): Hemoglobin A1C: 5.7 % — AB (ref 4.0–5.6)

## 2022-01-09 MED ORDER — ATORVASTATIN CALCIUM 40 MG PO TABS
40.0000 mg | ORAL_TABLET | Freq: Every day | ORAL | 3 refills | Status: DC
Start: 2022-01-09 — End: 2023-02-03

## 2022-01-09 NOTE — Progress Notes (Signed)
Subjective:    Patient ID: Jared [name], male    DOB: Dec 10, 1946, 76 y.o.   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  He presents to the office today for follow up regarding DM/HTN/GOUT/HLD  DM Type II - managed with farxiga 5 mg daily. He has been on Metformin in the past but this was d/c due to controlled A1c. He does not monitor his BS at home. Denies episodes of hypoglycemia  Lab Results  Component Value Date   HGBA1C 5.7 (A) 01/09/2022   HTN - Managed with lisinopril 10 mg BID, Labetalol 200 mg BID, Isosorbide 20 mg TID, Hydralazine 25 mg TID and Norvasc 10 mg daily. He reports that on the three days a week he has been working he does not take the daytime dose of Hydralazine or isosorbide but has been more diligent on taking his medications as directed BP Readings from Last 3 Encounters:  01/09/22 132/82  10/03/21 (!) 150/90  09/19/21 (!) 142/88   Gout - Takes Allopurinol 100 mg BID. It was recommended by our clinical pharmacist to increase allopurinol to target uric acid level <6 due to frequent flares  HLD- also recommended by clinical pharmacy to change from simvastatin to Atorvastatin    Review of Systems See HPI   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   GERD (gastroesophageal reflux disease)    Gout 07/26/2007   "on daily RX"   (12/15/2018)   HYPERCHOLESTEROLEMIA, BORDERLINE 01/16/2010   HYPERTENSION 07/26/2007   OSA (obstructive sleep apnea)    "haven't used mask in quite awhile" (12/15/2018)   OSTEOARTHRITIS 09/28/2007   PLANTAR FASCIITIS, BILATERAL 04/19/2010   PROSTATITIS, RECURRENT 11/08/2009   RENAL INSUFFICIENCY 07/26/2007   Sleep apnea    off Cpap    Stroke ([place]) 01/21/2013   "fully recovered" (12/15/2018)   Type II diabetes mellitus ([place])    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   URINARY FREQUENCY, CHRONIC 03/10/2008    Social History   Socioeconomic History   Marital status: Married    Spouse name: [place]   Number of children: 5   Years of education: 12   Highest education level: Not on file  Occupational History   Occupation: TESTOR    Employer: [employer]    Comment: retired  Tobacco Use   Smoking status: Never   Smokeless tobacco: Never  Vaping Use   Vaping Use: Never used  Substance and Sexual Activity   Alcohol use: Not Currently   Drug use: Never   Sexual activity: Not Currently  Other Topics Concern   Not on file  Social History Narrative   He has a part time job for delivering car parts   Married    4 children - All in [place]      He likes to go to car shows and shoot pool.    Social Determinants of Health   Financial Resource Strain: Medium Risk   Difficulty of Paying Living Expenses: Somewhat hard  Food Insecurity: Not on file  Transportation Needs: No Transportation Needs  Lack of Transportation (Medical): No   Lack of Transportation (Non-Medical): No  Physical Activity: Not on file  Stress: Not on file  Social Connections: Not on file  Intimate Partner Violence: Not on file    Past Surgical History:  Procedure Laterality Date   COLONOSCOPY     CYST EXCISION Left    "leg"   TONSILLECTOMY     UPPER GASTROINTESTINAL ENDOSCOPY     WRIST SURGERY Right    "cut bone out; for gout""    Family History  Problem Relation Age of Onset   Heart disease  Mother    Heart disease Father    CVA Father    Colon polyps Father    Diabetes Sister    Colon cancer Neg Hx    Esophageal cancer Neg Hx    Stomach cancer Neg Hx    Pancreatic cancer Neg Hx    Rectal cancer Neg Hx     Allergies  Allergen Reactions   Strawberry Extract Shortness Of Breath    Current Outpatient Medications on File Prior to Visit  Medication Sig Dispense Refill   allopurinol (ZYLOPRIM) 100 MG tablet TAKE 1 TABLET BY MOUTH TWICE A DAY 180 tablet 3   amLODipine (NORVASC) 10 MG tablet TAKE 1 TABLET BY MOUTH EVERY DAY 90 tablet 1   aspirin EC 81 MG tablet Take 81 mg by mouth daily. Swallow whole.     blood glucose meter kit and supplies KIT Dispense based on patient and insurance preference. Use up to three times daily as directed. 1 each 0   Cholecalciferol (VITAMIN D-3) 125 MCG (5000 UT) TABS Take 1 tablet by mouth daily.     dapagliflozin propanediol (FARXIGA) 5 MG TABS tablet Take 1 tablet (5 mg total) by mouth daily before breakfast. 90 tablet 3   hydrALAZINE (APRESOLINE) 25 MG tablet Take 1 tablet (25 mg total) by mouth 3 (three) times daily. 270 tablet 3   labetalol (NORMODYNE) 200 MG tablet Take 2 tablets (400 mg total) by mouth 2 (two) times daily. 360 tablet 2   losartan (COZAAR) 25 MG tablet TAKE 1 TABLET (25 MG TOTAL) BY MOUTH DAILY. 90 tablet 0   pantoprazole (PROTONIX) 40 MG tablet TAKE 1 TABLET BY MOUTH TWICE A DAY 180 tablet 3   isosorbide dinitrate (ISORDIL) 20 MG tablet Take 1 tablet (20 mg total) by mouth 3 (three) times daily. 270 tablet 3   No current facility-administered medications on file prior to visit.    BP 132/82    Pulse 69    Temp 98.5 F (36.9 C) (Oral)    Ht 5' 7.5" (1.715 m)    Wt 221 lb (100.2 kg)    SpO2 99%    BMI 34.10 kg/m       Objective:   Physical Exam Vitals and nursing note reviewed.  Constitutional:      Appearance: Normal appearance.  Cardiovascular:     Rate and Rhythm: Normal rate and regular rhythm.     Pulses:  Normal pulses.     Heart sounds: Normal heart sounds.  Pulmonary:     Effort: Pulmonary effort is normal.     Breath sounds: Normal breath sounds.  Musculoskeletal:        General: Normal range of motion.  Skin:    General: Skin is warm and dry.     Capillary Refill: Capillary refill takes less than 2 seconds.  Neurological:     General: No focal deficit present.  Mental Status: He is alert and oriented to person, place, and time.  Psychiatric:        Mood and Affect: Mood normal.        Behavior: Behavior normal.        Thought Content: Thought content normal.        Judgment: Judgment normal.      Assessment & Plan:   1. Type 2 diabetes mellitus with diabetic nephropathy, without long-term current use of insulin (HCC) - Well controlled.  - No change in medication - POC HgB A1c- 5.7  - Comprehensive metabolic panel; Future - Comprehensive metabolic panel  2. Mixed hyperlipidemia - D/c simvastatin  - atorvastatin (LIPITOR) 40 MG tablet; Take 1 tablet (40 mg total) by mouth daily.  Dispense: 90 tablet; Refill: 3  3. Essential hypertension - Under better control since taking his medications as directed  4. Acute gout of multiple sites, unspecified cause - Consider increase of allopurinol depending on kidney function  - Uric Acid; Future - Uric Acid   Dorothyann Peng, NP

## 2022-01-09 NOTE — Patient Instructions (Signed)
I am going to have you stop Simvastatin and start Atorvastatin for cholesterol   I am going to check your uric acid level today

## 2022-01-14 ENCOUNTER — Telehealth: Payer: Self-pay | Admitting: Pharmacist

## 2022-01-14 NOTE — Chronic Care Management (AMB) (Signed)
Chronic Care Management Pharmacy Assistant   Name: Jared [name]  MRN: [id number] DOB: 12/15/1946  Reason for Encounter: Disease State / Hypertension Assessment Call   Conditions to be addressed/monitored: HTN  Recent office visits:  01/09/2022 Dorothyann Peng NP - Patient was seen for Type 2 diabetes mellitus with diabetic nephropathy, without long-term current use of insulin and additional issues. Started Atorvastatin 40 mg daily. Discontinued Flexeril and Simvastatin. No follow up noted.   Recent consult visits:  None  Hospital visits:  None  Medications: Outpatient Encounter Medications as of 01/14/2022  Medication Sig   allopurinol (ZYLOPRIM) 100 MG tablet TAKE 1 TABLET BY MOUTH TWICE A DAY   amLODipine (NORVASC) 10 MG tablet TAKE 1 TABLET BY MOUTH EVERY DAY   aspirin EC 81 MG tablet Take 81 mg by mouth daily. Swallow whole.   atorvastatin (LIPITOR) 40 MG tablet Take 1 tablet (40 mg total) by mouth daily.   blood glucose meter kit and supplies KIT Dispense based on patient and insurance preference. Use up to three times daily as directed.   Cholecalciferol (VITAMIN D-3) 125 MCG (5000 UT) TABS Take 1 tablet by mouth daily.   dapagliflozin propanediol (FARXIGA) 5 MG TABS tablet Take 1 tablet (5 mg total) by mouth daily before breakfast.   hydrALAZINE (APRESOLINE) 25 MG tablet Take 1 tablet (25 mg total) by mouth 3 (three) times daily.   isosorbide dinitrate (ISORDIL) 20 MG tablet Take 1 tablet (20 mg total) by mouth 3 (three) times daily.   labetalol (NORMODYNE) 200 MG tablet Take 2 tablets (400 mg total) by mouth 2 (two) times daily.   losartan (COZAAR) 25 MG tablet TAKE 1 TABLET (25 MG TOTAL) BY MOUTH DAILY.   pantoprazole (PROTONIX) 40 MG tablet TAKE 1 TABLET BY MOUTH TWICE A DAY   No facility-administered encounter medications on file as of 01/14/2022.  Fill History: ALLOPURINOL 100 MG TABLET 11/26/2021 90   ATORVASTATIN CALCIUM  40 MG TABS 01/09/2022 90   ISOSORBIDE  DINITRATE 20 MG TAB 12/14/2021 90   LABETALOL HCL 200 MG TABLET 11/19/2021 90   LOSARTAN POTASSIUM 25 MG TAB 10/26/2021 90   PANTOPRAZOLE SOD DR 40 MG TAB 12/23/2021 90   AMLODIPINE BESYLATE 10 MG TAB 12/05/2021 90   Reviewed chart prior to disease state call. Spoke with patient regarding BP  Recent Office Vitals: BP Readings from Last 3 Encounters:  01/09/22 132/82  10/03/21 (!) 150/90  09/19/21 (!) 142/88   Pulse Readings from Last 3 Encounters:  01/09/22 69  10/03/21 78  09/19/21 62    Wt Readings from Last 3 Encounters:  01/09/22 221 lb (100.2 kg)  10/03/21 227 lb (103 kg)  09/19/21 222 lb (100.7 kg)     Kidney Function Lab Results  Component Value Date/Time   CREATININE 2.77 (H) 01/09/2022 11:23 AM   CREATININE 2.48 (H) 09/19/2021 11:47 AM   CREATININE 3.00 (H) 08/23/2020 02:21 PM   CREATININE 2.11 (H) 05/24/2015 11:38 AM   GFR 22.03 (L) 01/09/2022 11:23 AM   GFRNONAA 20 (L) 08/23/2020 02:21 PM   GFRAA 23 (L) 08/23/2020 02:21 PM    BMP Latest Ref Rng & Units 01/09/2022 09/19/2021 08/23/2020  Glucose 70 - 99 mg/dL 144(H) 108(H) 74  BUN 6 - 23 mg/dL 26(H) 26(H) 29(H)  Creatinine 0.40 - 1.50 mg/dL 2.77(H) 2.48(H) 3.00(H)  BUN/Creat Ratio 6 - 22 (calc) - - 10  Sodium 135 - 145 mEq/L 141 142 142  Potassium 3.5 - 5.1 mEq/L 3.9 4.4  4.0  Chloride 96 - 112 mEq/L 106 107 108  CO2 19 - 32 mEq/L '25 25 24  ' Calcium 8.4 - 10.5 mg/dL 9.1 9.6 9.1    Current antihypertensive regimen:  Amlodipine 10 mg daily Losartan 25 mg daily Hydralazine 25 mg three times daily - verified with patient, he states he has increased this medication to three times daily.   Reviewed all of patients medications and he voices a clear understanding of what he is to be taking.   How often are you checking your Blood Pressure? Patient has not purchased a blood pressure cuff yet, he states he will get one this payday.    Current home BP readings: No readings, patient will purchase blood pressure  cuff this payday.   What recent interventions/DTPs have been made by any provider to improve Blood Pressure control since last CPP Visit: Patient to purchase a blood pressure cuff and start checking blood pressures also patient to increase Hydralazine to three times daily.   Any recent hospitalizations or ED visits since last visit with CPP? No recent hospital visits.   What diet changes have been made to improve Blood Pressure Control?  Patient does not follow any type of diet Breakfast - Patient will have cereal or an egg/cheese biscuit Lunch - Patient doesn't always eat lunch, when he does he will generally get a chicken pot pie from [place]. Dinner - Patient will eat dinner only if he is [place], when he eats dinner it is generally fish or chicken and sometimes a vegetable.   What exercise is being done to improve your Blood Pressure Control?  Patient works a part time job and walk a lot with his job.   Adherence Review: Is the patient currently on ACE/ARB medication? Yes Does the patient have >5 day gap between last estimated fill dates? No  Care Gaps: AWV - completed 09/10/2021 Last BP - 132/82 on 01/09/2022 Tetanus/TDAP - overdue  Covid vaccine  - overdue Shingrix - overdue  Star Rating Drugs: Losartan 64m - [facility] Atorvastatin 40 mg - [phone]

## 2022-02-02 ENCOUNTER — Other Ambulatory Visit: Payer: Self-pay | Admitting: Adult Health

## 2022-02-04 DIAGNOSIS — N184 Chronic kidney disease, stage 4 (severe): Secondary | ICD-10-CM | POA: Diagnosis not present

## 2022-02-04 DIAGNOSIS — N2581 Secondary hyperparathyroidism of renal origin: Secondary | ICD-10-CM | POA: Diagnosis not present

## 2022-02-04 DIAGNOSIS — I129 Hypertensive chronic kidney disease with stage 1 through stage 4 chronic kidney disease, or unspecified chronic kidney disease: Secondary | ICD-10-CM | POA: Diagnosis not present

## 2022-02-04 DIAGNOSIS — D631 Anemia in chronic kidney disease: Secondary | ICD-10-CM | POA: Diagnosis not present

## 2022-02-04 DIAGNOSIS — N390 Urinary tract infection, site not specified: Secondary | ICD-10-CM | POA: Diagnosis not present

## 2022-02-26 ENCOUNTER — Telehealth: Payer: Self-pay | Admitting: Pharmacist

## 2022-02-26 NOTE — Chronic Care Management (AMB) (Signed)
? ? ?Chronic Care Management ?Pharmacy Assistant  ? ?Name: Jared [name]  MRN: [id number] DOB: 06/13/1946 ? ?Reason for Encounter: Disease State / Hypertension Assessment Call ?  ?Conditions to be addressed/monitored: ?HTN ? ? ?Recent office visits:  ?None ? ?Recent consult visits:  ?None ? ?Hospital visits:  ?None ? ?Medications: ?Outpatient Encounter Medications as of 02/26/2022  ?Medication Sig  ? allopurinol (ZYLOPRIM) 100 MG tablet TAKE 1 TABLET BY MOUTH TWICE A DAY  ? amLODipine (NORVASC) 10 MG tablet TAKE 1 TABLET BY MOUTH EVERY DAY  ? aspirin EC 81 MG tablet Take 81 mg by mouth daily. Swallow whole.  ? atorvastatin (LIPITOR) 40 MG tablet Take 1 tablet (40 mg total) by mouth daily.  ? blood glucose meter kit and supplies KIT Dispense based on patient and insurance preference. Use up to three times daily as directed.  ? Cholecalciferol (VITAMIN D-3) 125 MCG (5000 UT) TABS Take 1 tablet by mouth daily.  ? dapagliflozin propanediol (FARXIGA) 5 MG TABS tablet Take 1 tablet (5 mg total) by mouth daily before breakfast.  ? hydrALAZINE (APRESOLINE) 25 MG tablet Take 1 tablet (25 mg total) by mouth 3 (three) times daily.  ? isosorbide dinitrate (ISORDIL) 20 MG tablet Take 1 tablet (20 mg total) by mouth 3 (three) times daily.  ? labetalol (NORMODYNE) 200 MG tablet Take 2 tablets (400 mg total) by mouth 2 (two) times daily.  ? losartan (COZAAR) 25 MG tablet TAKE 1 TABLET (25 MG TOTAL) BY MOUTH DAILY.  ? pantoprazole (PROTONIX) 40 MG tablet TAKE 1 TABLET BY MOUTH TWICE A DAY  ? ?No facility-administered encounter medications on file as of 02/26/2022.  ?Fill History: ?ALLOPURINOL 100 MG TABLET 11/26/2021 90  ? ?ATORVASTATIN CALCIUM  40 MG TABS 01/09/2022 90  ? ?ISOSORBIDE DINITRATE 20 MG TAB 12/14/2021 90  ? ?LABETALOL HCL 200 MG TABLET 11/19/2021 90  ? ?LOSARTAN POTASSIUM 25 MG TAB 10/26/2021 90  ? ?PANTOPRAZOLE SOD DR 40 MG TAB 12/23/2021 90  ? ?AMLODIPINE BESYLATE 10 MG TAB 12/05/2021 90  ? ?Reviewed chart prior to  disease state call. Spoke with patient regarding BP ? ?Recent Office Vitals: ?BP Readings from Last 3 Encounters:  ?01/09/22 132/82  ?10/03/21 (!) 150/90  ?09/19/21 (!) 142/88  ? ?Pulse Readings from Last 3 Encounters:  ?01/09/22 69  ?10/03/21 78  ?09/19/21 62  ?  ?Wt Readings from Last 3 Encounters:  ?01/09/22 221 lb (100.2 kg)  ?10/03/21 227 lb (103 kg)  ?09/19/21 222 lb (100.7 kg)  ?  ? ?Kidney Function ?Lab Results  ?Component Value Date/Time  ? CREATININE 2.77 (H) 01/09/2022 11:23 AM  ? CREATININE 2.48 (H) 09/19/2021 11:47 AM  ? CREATININE 3.00 (H) 08/23/2020 02:21 PM  ? CREATININE 2.11 (H) 05/24/2015 11:38 AM  ? GFR 22.03 (L) 01/09/2022 11:23 AM  ? GFRNONAA 20 (L) 08/23/2020 02:21 PM  ? GFRAA 23 (L) 08/23/2020 02:21 PM  ? ? ?BMP Latest Ref Rng & Units 01/09/2022 09/19/2021 08/23/2020  ?Glucose 70 - 99 mg/dL 144(H) 108(H) 74  ?BUN 6 - 23 mg/dL 26(H) 26(H) 29(H)  ?Creatinine 0.40 - 1.50 mg/dL 2.77(H) 2.48(H) 3.00(H)  ?BUN/Creat Ratio 6 - 22 (calc) - - 10  ?Sodium 135 - 145 mEq/L 141 142 142  ?Potassium 3.5 - 5.1 mEq/L 3.9 4.4 4.0  ?Chloride 96 - 112 mEq/L 106 107 108  ?CO2 19 - 32 mEq/L _0 ?Calcium 8.4 - 10.5 mg/dL 9.1 9.6 9.1  ? ?Current antihypertensive regimen:  ?Amlodipine 10 mg  daily ?Losartan 25 mg daily ?Hydralazine 25 mg three times daily  ? ?How often are you checking your Blood Pressure? Patient states he is checking his blood pressure about 2 times per week.  ? ?Current home BP readings: Patient wasn't at home however, he states his readings have been around 125/75.  ? ?What recent interventions/DTPs have been made by any provider to improve Blood Pressure control since last CPP Visit: No recent interventions ? ?Any recent hospitalizations or ED visits since last visit with CPP? No recent hospital visits.  ? ?What diet changes have been made to improve Blood Pressure Control?  ?Patient does not follow any type of diet ?Breakfast - Patient will have cereal or an egg/cheese biscuit ?Lunch - Patient  doesn't always eat lunch, when he does he will generally get a chicken pot pie from [place]. ?Dinner - Patient will eat dinner only if he is [place], when he eats dinner it is generally fish or chicken and sometimes a vegetable.  ? ?What exercise is being done to improve your Blood Pressure Control?  ?Patient works a part time job and walk a lot with his job.  ? ?Adherence Review: ?Is the patient currently on ACE/ARB medication? Yes ?Does the patient have >5 day gap between last estimated fill dates? Yes ? ? ?Care Gaps: ?AWV - completed 09/10/2021 ?Last BP - 132/82 on 01/09/2022 ?Tetanus/TDAP - overdue  ?Covid vaccine  - overdue ?Shingrix - overdue ? ?Star Rating Drugs: ?Losartan 60m - [facility], [facility] was closed and patient stated he took his last pill today and is calling for a refill.  ?Atorvastatin 40 mg - [facility] ? ?JGennie AlmaCMA  ?Clinical Pharmacist Assistant ?[phone]? ?

## 2022-03-10 ENCOUNTER — Other Ambulatory Visit: Payer: Self-pay | Admitting: Adult Health

## 2022-03-18 ENCOUNTER — Ambulatory Visit: Payer: Medicare PPO

## 2022-03-18 ENCOUNTER — Telehealth: Payer: Self-pay

## 2022-03-18 NOTE — Telephone Encounter (Signed)
This nurse attempted to call patient three times for scheduled virtual AWV. Phone continuously rang with no voicemail to leave a message. ?

## 2022-04-03 ENCOUNTER — Other Ambulatory Visit: Payer: Self-pay | Admitting: Adult Health

## 2022-04-08 ENCOUNTER — Telehealth: Payer: Self-pay | Admitting: Adult Health

## 2022-04-08 NOTE — Telephone Encounter (Signed)
Tried calling patient to schedule Medicare Annual Wellness Visit (AWV) either virtually or in office. ? ?No answer ? ? AWV-I per [place] 09/21/14  ? please schedule at anytime with [facility] Nurse Health Advisor 1 or 2 ? ? ?This should be a 45 minute visit.  ?

## 2022-04-21 ENCOUNTER — Telehealth: Payer: Self-pay | Admitting: Pharmacist

## 2022-04-21 NOTE — Chronic Care Management (AMB) (Addendum)
    Chronic Care Management Pharmacy Assistant   Name: Jared [name]  MRN: [id number] DOB: 1946-09-26  Reason for Encounter: Reschedule 05/02/22 appointment with [name] clinical pharmacist.  Appointment rescheduled with patient to 07/25/2022.   [place] Pharmacist Assistant [phone]

## 2022-04-22 ENCOUNTER — Encounter: Payer: Self-pay | Admitting: Adult Health

## 2022-04-22 ENCOUNTER — Ambulatory Visit (INDEPENDENT_AMBULATORY_CARE_PROVIDER_SITE_OTHER): Payer: Medicare PPO | Admitting: Adult Health

## 2022-04-22 VITALS — BP 122/98 | HR 67 | Temp 99.0°F | Ht 67.5 in | Wt 233.0 lb

## 2022-04-22 DIAGNOSIS — E782 Mixed hyperlipidemia: Secondary | ICD-10-CM

## 2022-04-22 DIAGNOSIS — R600 Localized edema: Secondary | ICD-10-CM | POA: Diagnosis not present

## 2022-04-22 DIAGNOSIS — L819 Disorder of pigmentation, unspecified: Secondary | ICD-10-CM | POA: Diagnosis not present

## 2022-04-22 NOTE — Progress Notes (Signed)
? ?Subjective:  ? ? Patient ID: Jared [name], male    DOB: 08-Oct-1946, 76 y.o.   MRN: [id number] ? 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 ? ?He presents to the office today for an acute issue.  He reports that a few weeks ago he noticed dark rash on his feet, some mild swelling, and his legs felt heavy.  He stopped taking simvastatin and the symptoms resolved, he then restarted the simvastatin and the symptoms came back so he stopped the simvastatin again and again his symptoms have started to resolve. ? ?We discussed his medication, he was changed from simvastatin to Lipitor and reports that he was taking both statin medications.  He denies myalgia or fatigue while taking both of these medications. ? ?Review of Systems ?See HPI  ? ?Past Medical History:  ?Diagnosis Date  ? Childhood asthma   ? 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  ? GERD (gastroesophageal reflux disease)   ? Gout 07/26/2007  ? "on daily RX"  (12/15/2018)  ? HYPERCHOLESTEROLEMIA, BORDERLINE 01/16/2010  ? HYPERTENSION 07/26/2007  ? OSA (obstructive sleep apnea)   ? "haven't used mask in quite awhile" (12/15/2018)  ? OSTEOARTHRITIS 09/28/2007  ? PLANTAR FASCIITIS, BILATERAL 04/19/2010  ? PROSTATITIS, RECURRENT 11/08/2009  ? RENAL INSUFFICIENCY 07/26/2007  ? Sleep apnea   ? off Cpap   ? Stroke [facility]) 01/21/2013  ? "fully recovered" (12/15/2018)  ? Type II diabetes mellitus ([place])   ? Unspecified  disorder of lipoid metabolism 11/08/2009  ? URINARY FREQUENCY, CHRONIC 03/10/2008  ? ? ?Social History  ? ?Socioeconomic History  ? Marital status: Married  ?  Spouse name: Jared [name]  ? Number of children: 5  ? Years of education: 53  ? Highest education level: Not on file  ?Occupational History  ? Occupation: TESTOR  ?  Employer: [employer]  ?  Comment: retired  ?Tobacco Use  ? Smoking status: Never  ? Smokeless tobacco: Never  ?Vaping Use  ? Vaping Use: Never used  ?Substance and Sexual Activity  ? Alcohol use: Not Currently  ? Drug use: Never  ? Sexual activity: Not Currently  ?Other Topics Concern  ? Not on file  ?Social History Narrative  ? He has a part time job for delivering car parts  ? Married   ? 4 children - All in [place]  ?   ? He likes to go to car shows and shoot pool.   ? ?Social Determinants of Health  ? ?Financial Resource Strain: Not on file  ?Food Insecurity: Not on file  ?Transportation Needs: Not on file  ?Physical Activity: Not on file  ?Stress: Not on file  ?Social Connections: Not on file  ?Intimate Partner Violence: Not on file  ? ? ?Past Surgical History:  ?Procedure Laterality Date  ? COLONOSCOPY    ? CYST EXCISION Left   ? "leg"  ? TONSILLECTOMY    ? UPPER GASTROINTESTINAL ENDOSCOPY    ? WRIST SURGERY Right   ? "cut bone out; for gout""  ? ? ?Family History  ?Problem Relation Age of Onset  ? Heart disease Mother   ? Heart disease Father   ?  CVA Father   ? Colon polyps Father   ? Diabetes Sister   ? Colon cancer Neg Hx   ? Esophageal cancer Neg Hx   ? Stomach cancer Neg Hx   ? Pancreatic cancer Neg Hx   ? Rectal cancer Neg Hx   ? ? ?Allergies  ?Allergen Reactions  ? Strawberry Extract Shortness Of Breath  ? ? ?Current Outpatient Medications on File Prior to Visit  ?Medication Sig Dispense Refill  ? allopurinol (ZYLOPRIM) 100 MG tablet TAKE 1 TABLET BY MOUTH TWICE A DAY 180 tablet 3  ? amLODipine (NORVASC) 10 MG tablet TAKE 1 TABLET BY MOUTH EVERY DAY 90 tablet 1  ? aspirin EC 81 MG tablet Take 81 mg  by mouth daily. Swallow whole.    ? atorvastatin (LIPITOR) 40 MG tablet Take 1 tablet (40 mg total) by mouth daily. 90 tablet 3  ? blood glucose meter kit and supplies KIT Dispense based on patient and insurance preference. Use up to three times daily as directed. 1 each 0  ? Cholecalciferol (VITAMIN D-3) 125 MCG (5000 UT) TABS Take 1 tablet by mouth daily.    ? dapagliflozin propanediol (FARXIGA) 5 MG TABS tablet Take 1 tablet (5 mg total) by mouth daily before breakfast. 90 tablet 3  ? labetalol (NORMODYNE) 200 MG tablet Take 2 tablets (400 mg total) by mouth 2 (two) times daily. 360 tablet 2  ? losartan (COZAAR) 25 MG tablet TAKE 1 TABLET (25 MG TOTAL) BY MOUTH DAILY. 90 tablet 0  ? pantoprazole (PROTONIX) 40 MG tablet TAKE 1 TABLET BY MOUTH TWICE A DAY 180 tablet 3  ? hydrALAZINE (APRESOLINE) 25 MG tablet Take 1 tablet (25 mg total) by mouth 3 (three) times daily. 270 tablet 3  ? isosorbide dinitrate (ISORDIL) 20 MG tablet Take 1 tablet (20 mg total) by mouth 3 (three) times daily. 270 tablet 3  ? ?No current facility-administered medications on file prior to visit.  ? ? ?BP (!) 122/98   Pulse 67   Temp 99 ?F (37.2 ?C) (Oral)   Ht 5' 7.5" (1.715 m)   Wt 233 lb (105.7 kg)   SpO2 97%   BMI 35.95 kg/m?  ? ? ?   ?Objective:  ? Physical Exam ?Vitals and nursing note reviewed.  ?Constitutional:   ?   Appearance: Normal appearance.  ?Cardiovascular:  ?   Rate and Rhythm: Normal rate and regular rhythm.  ?   Pulses: Normal pulses.  ?   Heart sounds: Normal heart sounds.  ?Pulmonary:  ?   Effort: Pulmonary effort is normal.  ?   Breath sounds: Normal breath sounds.  ?Neurological:  ?   Mental Status: He is alert.  ? ?   ?Assessment & Plan:  ? ?His symptoms seem to be from taking simvastatin and atorvastatin together.  Advised to throw away his simvastatin or take it to a pharmacy for disposal.  Only take atorvastatin.  Follow-up as needed ?

## 2022-05-02 ENCOUNTER — Telehealth: Payer: Medicare Other

## 2022-05-06 ENCOUNTER — Ambulatory Visit (INDEPENDENT_AMBULATORY_CARE_PROVIDER_SITE_OTHER): Payer: Medicare PPO

## 2022-05-06 VITALS — BP 150/82 | HR 72 | Temp 97.8°F | Ht 68.0 in | Wt 236.8 lb

## 2022-05-06 DIAGNOSIS — Z0000 Encounter for general adult medical examination without abnormal findings: Secondary | ICD-10-CM

## 2022-05-06 NOTE — Progress Notes (Signed)
? ?Subjective:  ? Mattew E Vannice is a 76 y.o. male who presents for an Initial Medicare Annual Wellness Visit. ? ?Review of Systems    ? ?Cardiac Risk Factors include: advanced age (>54mn, >>41women);diabetes mellitus;dyslipidemia;hypertension;male gender;obesity (BMI >30kg/m2) ? ?   ?Objective:  ?  ?Today's Vitals  ? 05/06/22 1154 05/06/22 1237  ?BP: (!) 160/90 (!) 150/82  ?Pulse: 72   ?Temp: 97.8 ?F (36.6 ?C)   ?TempSrc: Oral   ?SpO2: 98%   ?Weight: 236 lb 12.8 oz (107.4 kg)   ?Height: '5\' 8"'  (1.727 m)   ? ?Body mass index is 36.01 kg/m?. ? ? ?  05/06/2022  ? 12:09 PM 12/14/2018  ?  8:54 PM 05/27/2018  ?  2:37 PM 06/09/2015  ?  6:56 PM 01/21/2013  ? 11:00 PM  ?Advanced Directives  ?Does Patient Have a Medical Advance Directive? No No No No Patient does not have advance directive  ?Would patient like information on creating a medical advance directive? Yes (MAU/Ambulatory/Procedural Areas - Information given) No - Patient declined     ?Pre-existing out of facility DNR order (yellow form or pink MOST form)     No  ? ? ?Current Medications (verified) ?Outpatient Encounter Medications as of 05/06/2022  ?Medication Sig  ? allopurinol (ZYLOPRIM) 100 MG tablet TAKE 1 TABLET BY MOUTH TWICE A DAY  ? amLODipine (NORVASC) 10 MG tablet TAKE 1 TABLET BY MOUTH EVERY DAY  ? aspirin EC 81 MG tablet Take 81 mg by mouth daily. Swallow whole.  ? atorvastatin (LIPITOR) 40 MG tablet Take 1 tablet (40 mg total) by mouth daily.  ? blood glucose meter kit and supplies KIT Dispense based on patient and insurance preference. Use up to three times daily as directed.  ? Cholecalciferol (VITAMIN D-3) 125 MCG (5000 UT) TABS Take 1 tablet by mouth daily.  ? labetalol (NORMODYNE) 200 MG tablet Take 2 tablets (400 mg total) by mouth 2 (two) times daily.  ? losartan (COZAAR) 25 MG tablet TAKE 1 TABLET (25 MG TOTAL) BY MOUTH DAILY.  ? pantoprazole (PROTONIX) 40 MG tablet TAKE 1 TABLET BY MOUTH TWICE A DAY  ? dapagliflozin propanediol (FARXIGA) 5 MG TABS  tablet Take 1 tablet (5 mg total) by mouth daily before breakfast. (Patient not taking: Reported on 05/06/2022)  ? hydrALAZINE (APRESOLINE) 25 MG tablet Take 1 tablet (25 mg total) by mouth 3 (three) times daily.  ? isosorbide dinitrate (ISORDIL) 20 MG tablet Take 1 tablet (20 mg total) by mouth 3 (three) times daily.  ? ?No facility-administered encounter medications on file as of 05/06/2022.  ? ? ?Allergies (verified) ?Strawberry extract  ? ?History: ?Past Medical History:  ?Diagnosis Date  ? Childhood asthma   ? 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  ? GERD (gastroesophageal reflux disease)   ? Gout 07/26/2007  ? "on daily RX"  (12/15/2018)  ? HYPERCHOLESTEROLEMIA, BORDERLINE 01/16/2010  ? HYPERTENSION 07/26/2007  ? OSA (obstructive sleep apnea)   ? "haven't used mask in quite awhile" (12/15/2018)  ? OSTEOARTHRITIS 09/28/2007  ? PLANTAR FASCIITIS, BILATERAL 04/19/2010  ? PROSTATITIS, RECURRENT 11/08/2009  ? RENAL INSUFFICIENCY 07/26/2007  ? Sleep apnea   ? off Cpap   ? Stroke ([facility] 01/21/2013  ? "fully recovered" (12/15/2018)  ? [place]   ? 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  ? URINARY FREQUENCY, CHRONIC 03/10/2008  ? ?Past Surgical History:  ?Procedure Laterality Date  ? COLONOSCOPY    ? CYST EXCISION Left   ? "  leg"  ? TONSILLECTOMY    ? UPPER GASTROINTESTINAL ENDOSCOPY    ? WRIST SURGERY Right   ? "cut bone out; for gout""  ? ?Family History  ?Problem Relation Age of Onset  ? Heart disease Mother   ? Heart disease Father   ? CVA Father   ? Colon polyps Father   ? Diabetes Sister   ? Colon cancer Neg Hx   ? Esophageal cancer Neg Hx   ? Stomach cancer Neg Hx   ? Pancreatic cancer Neg Hx   ? Rectal cancer Neg Hx   ? ?Social History  ? ?Socioeconomic History  ? Marital status: Married  ?  Spouse name: [name]  ? Number of children: 5  ? Years of education: 26  ? Highest education level: Not on file  ?Occupational History  ? Occupation: TESTOR  ?  Employer: [employer]  ?   Comment: retired  ?Tobacco Use  ? Smoking status: Never  ? Smokeless tobacco: Never  ?Vaping Use  ? Vaping Use: Never used  ?Substance and Sexual Activity  ? Alcohol use: Not Currently  ? Drug use: Never  ? Sexual activity: Not Currently  ?Other Topics Concern  ? Not on file  ?Social History Narrative  ? He has a part time job for delivering car parts  ? Married   ? 4 children - All in [place]  ?   ? He likes to go to car shows and shoot pool.   ? ?Social Determinants of Health  ? ?Financial Resource Strain: Low Risk   ? Difficulty of Paying Living Expenses: Not hard at all  ?Food Insecurity: No Food Insecurity  ? Worried About Charity fundraiser in the Last Year: Never true  ? Ran Out of Food in the Last Year: Never true  ?Transportation Needs: No Transportation Needs  ? Lack of Transportation (Medical): No  ? Lack of Transportation (Non-Medical): No  ?Physical Activity: Inactive  ? Days of Exercise per Week: 0 days  ? Minutes of Exercise per Session: 0 min  ?Stress: No Stress Concern Present  ? Feeling of Stress : Only a little  ?Social Connections: Not on file  ? ? ?Tobacco Counseling ?Counseling given: Not Answered ? ? ?Clinical Intake: ? ?Pre-visit preparation completed: Yes ? ?Pain : No/denies pain ? ?  ? ?Nutritional Status: BMI > 30  Obese ?Nutritional Risks: None ?Diabetes: Yes ? ?How often do you need to have someone help you when you read instructions, pamphlets, or other written materials from your doctor or pharmacy?: 1 - Never ?What is the last grade level you completed in school?: 12th grade ? ?Diabetic? Yes ?Nutrition Risk Assessment: ? ?Has the patient had any N/V/D within the last 2 months?  No  ?Does the patient have any non-healing wounds?  No  ?Has the patient had any unintentional weight loss or weight gain?  Yes  ? ?Diabetes: ? ?Is the patient diabetic?  Yes  ?If diabetic, was a CBG obtained today?  No  ?Did the patient bring in their glucometer from home?  No  ?How often do you monitor your  CBG's? Does not.  ? ?Financial Strains and Diabetes Management: ? ?Are you having any financial strains with the device, your supplies or your medication? No .  ?Does the patient want to be seen by Chronic Care Management for management of their diabetes?  No  ?Would the patient like to be referred to a Nutritionist or for Diabetic Management?  No  ? ?  Diabetic Exams: ? ?Diabetic Eye Exam: Completed 08/06/2021 ?Diabetic Foot Exam: Completed 06/25/2021 ? ? ?Interpreter Needed?: No ? ?Information entered by :: NAllen LPN ? ? ?Activities of Daily Living ? ?  05/06/2022  ? 12:14 PM 09/19/2021  ? 11:12 AM  ?In your present state of health, do you have any difficulty performing the following activities:  ?Hearing? 0 1  ?Comment  left ear  ?Vision? 1 0  ?Comment a little blurry even with glasses sometimes   ?Difficulty concentrating or making decisions? 1 0  ?Comment memory sometimes   ?Walking or climbing stairs? 1 1  ?Dressing or bathing? 0 0  ?Doing errands, shopping? 0 0  ?Preparing Food and eating ? N   ?Using the Toilet? N   ?In the past six months, have you accidently leaked urine? Y   ?Do you have problems with loss of bowel control? N   ?Managing your Medications? N   ?Managing your Finances? N   ?Housekeeping or managing your Housekeeping? N   ? ? ?Patient Care Team: ?Dorothyann Peng, NP as PCP - General (Family Medicine) ?Lenis Dickinson, MD (Inactive) as Referring Physician (Neurology) ?Ralene Bathe, MD as Consulting Physician (Ophthalmology) ?Elmarie Shiley, MD as Consulting Physician (Nephrology) ?Viona Gilmore, [facility] as Pharmacist (Pharmacist) ? ?Indicate any recent Medical Services you may have received from other than [facility] providers in the past year (date may be approximate). ? ?   ?Assessment:  ? This is a routine wellness examination for Yigit. ? ?Hearing/Vision screen ?Vision Screening - Comments:: No regular eye exams,  ? ?Dietary issues and exercise activities discussed: ?Current Exercise Habits: The  patient does not participate in regular exercise at present ? ? Goals Addressed   ? ?  ?  ?  ?  ? This Visit's Progress  ?  Patient Stated     ?  05/06/2022, wants to start going to the gym ?  ? ?  ? ?Depression Screen

## 2022-05-06 NOTE — Patient Instructions (Addendum)
Jared [name] , ?Thank you for taking time to come for your Medicare Wellness Visit. I appreciate your ongoing commitment to your health goals. Please review the following plan we discussed and let me know if I can assist you in the future.  ? ?Screening recommendations/referrals: ?Colonoscopy: completed 05/27/2018, due 05/28/2023 ?Recommended yearly ophthalmology/optometry visit for glaucoma screening and checkup ?Recommended yearly dental visit for hygiene and checkup ? ?Vaccinations: ?Influenza vaccine: due 07/22/2022 ?Pneumococcal vaccine: completed 06/02/2016 ?Tdap vaccine: due, can wait until injury ?Shingles vaccine: needs second shot get that at pharmacy ?Covid-19:  12/06/2020, 03/02/2020, 02/03/2020 ? ?Advanced directives: Advance directive discussed with you today. I have provided a copy for you to complete at home and have notarized. Once this is complete please bring a copy in to our office so we can scan it into your chart. ? ?Conditions/risks identified: none ? ?Next appointment: Follow up in one year for your annual wellness visit.  ? ? ?Preventive Care 3 Years and Older, Male ?Preventive care refers to lifestyle choices and visits with your health care provider that can promote health and wellness. ?What does preventive care include? ?A yearly physical exam. This is also called an annual well check. ?Dental exams once or twice a year. ?Routine eye exams. Ask your health care provider how often you should have your eyes checked. ?Personal lifestyle choices, including: ?Daily care of your teeth and gums. ?Regular physical activity. ?Eating a healthy diet. ?Avoiding tobacco and drug use. ?Limiting alcohol use. ?Practicing safe sex. ?Taking low doses of aspirin every day. ?Taking vitamin and mineral supplements as recommended by your health care provider. ?What happens during an annual well check? ?The services and screenings done by your health care provider during your annual well check will depend on your age,  overall health, lifestyle risk factors, and family history of disease. ?Counseling  ?Your health care provider may ask you questions about your: ?Alcohol use. ?Tobacco use. ?Drug use. ?Emotional well-being. ?Home and relationship well-being. ?Sexual activity. ?Eating habits. ?History of falls. ?Memory and ability to understand (cognition). ?Work and work Statistician. ?Screening  ?You may have the following tests or measurements: ?Height, weight, and BMI. ?Blood pressure. ?Lipid and cholesterol levels. These may be checked every 5 years, or more frequently if you are over 34 years old. ?Skin check. ?Lung cancer screening. You may have this screening every year starting at age 89 if you have a 30-pack-year history of smoking and currently smoke or have quit within the past 15 years. ?Fecal occult blood test (FOBT) of the stool. You may have this test every year starting at age 56. ?Flexible sigmoidoscopy or colonoscopy. You may have a sigmoidoscopy every 5 years or a colonoscopy every 10 years starting at age 60. ?Prostate cancer screening. Recommendations will vary depending on your family history and other risks. ?Hepatitis C blood test. ?Hepatitis B blood test. ?Sexually transmitted disease (STD) testing. ?Diabetes screening. This is done by checking your blood sugar (glucose) after you have not eaten for a while (fasting). You may have this done every 1-3 years. ?Abdominal aortic aneurysm (AAA) screening. You may need this if you are a current or former smoker. ?Osteoporosis. You may be screened starting at age 62 if you are at high risk. ?Talk with your health care provider about your test results, treatment options, and if necessary, the need for more tests. ?Vaccines  ?Your health care provider may recommend certain vaccines, such as: ?Influenza vaccine. This is recommended every year. ?Tetanus, diphtheria, and acellular pertussis (  Tdap, Td) vaccine. You may need a Td booster every 10 years. ?Zoster vaccine.  You may need this after age 18. ?Pneumococcal 13-valent conjugate (PCV13) vaccine. One dose is recommended after age 53. ?Pneumococcal polysaccharide (PPSV23) vaccine. One dose is recommended after age 87. ?Talk to your health care provider about which screenings and vaccines you need and how often you need them. ?This information is not intended to replace advice given to you by your health care provider. Make sure you discuss any questions you have with your health care provider. ?Document Released: 01/04/2016 Document Revised: 08/27/2016 Document Reviewed: 10/09/2015 ?Elsevier Interactive Patient Education ? 2017 [place]. ? ?Fall Prevention in the Home ?Falls can cause injuries. They can happen to people of all ages. There are many things you can do to make your home safe and to help prevent falls. ?What can I do on the outside of my home? ?Regularly fix the edges of walkways and driveways and fix any cracks. ?Remove anything that might make you trip as you walk through a door, such as a raised step or threshold. ?Trim any bushes or trees on the path to your home. ?Use bright outdoor lighting. ?Clear any walking paths of anything that might make someone trip, such as rocks or tools. ?Regularly check to see if handrails are loose or broken. Make sure that both sides of any steps have handrails. ?Any raised decks and porches should have guardrails on the edges. ?Have any leaves, snow, or ice cleared regularly. ?Use sand or salt on walking paths during winter. ?Clean up any spills in your garage right away. This includes oil or grease spills. ?What can I do in the bathroom? ?Use night lights. ?Install grab bars by the toilet and in the tub and shower. Do not use towel bars as grab bars. ?Use non-skid mats or decals in the tub or shower. ?If you need to sit down in the shower, use a plastic, non-slip stool. ?Keep the floor dry. Clean up any water that spills on the floor as soon as it happens. ?Remove soap  buildup in the tub or shower regularly. ?Attach bath mats securely with double-sided non-slip rug tape. ?Do not have throw rugs and other things on the floor that can make you trip. ?What can I do in the bedroom? ?Use night lights. ?Make sure that you have a light by your bed that is easy to reach. ?Do not use any sheets or blankets that are too big for your bed. They should not hang down onto the floor. ?Have a firm chair that has side arms. You can use this for support while you get dressed. ?Do not have throw rugs and other things on the floor that can make you trip. ?What can I do in the kitchen? ?Clean up any spills right away. ?Avoid walking on wet floors. ?Keep items that you use a lot in easy-to-reach places. ?If you need to reach something above you, use a strong step stool that has a grab bar. ?Keep electrical cords out of the way. ?Do not use floor polish or wax that makes floors slippery. If you must use wax, use non-skid floor wax. ?Do not have throw rugs and other things on the floor that can make you trip. ?What can I do with my stairs? ?Do not leave any items on the stairs. ?Make sure that there are handrails on both sides of the stairs and use them. Fix handrails that are broken or loose. Make sure that handrails are  as long as the stairways. ?Check any carpeting to make sure that it is firmly attached to the stairs. Fix any carpet that is loose or worn. ?Avoid having throw rugs at the top or bottom of the stairs. If you do have throw rugs, attach them to the floor with carpet tape. ?Make sure that you have a light switch at the top of the stairs and the bottom of the stairs. If you do not have them, ask someone to add them for you. ?What else can I do to help prevent falls? ?Wear shoes that: ?Do not have high heels. ?Have rubber bottoms. ?Are comfortable and fit you well. ?Are closed at the toe. Do not wear sandals. ?If you use a stepladder: ?Make sure that it is fully opened. Do not climb a closed  stepladder. ?Make sure that both sides of the stepladder are locked into place. ?Ask someone to hold it for you, if possible. ?Clearly mark and make sure that you can see: ?Any grab bars or handrails. ?First

## 2022-05-20 DIAGNOSIS — N184 Chronic kidney disease, stage 4 (severe): Secondary | ICD-10-CM | POA: Diagnosis not present

## 2022-05-29 DIAGNOSIS — N2581 Secondary hyperparathyroidism of renal origin: Secondary | ICD-10-CM | POA: Diagnosis not present

## 2022-05-29 DIAGNOSIS — I129 Hypertensive chronic kidney disease with stage 1 through stage 4 chronic kidney disease, or unspecified chronic kidney disease: Secondary | ICD-10-CM | POA: Diagnosis not present

## 2022-05-29 DIAGNOSIS — D631 Anemia in chronic kidney disease: Secondary | ICD-10-CM | POA: Diagnosis not present

## 2022-05-29 DIAGNOSIS — N184 Chronic kidney disease, stage 4 (severe): Secondary | ICD-10-CM | POA: Diagnosis not present

## 2022-07-03 ENCOUNTER — Other Ambulatory Visit: Payer: Self-pay | Admitting: Internal Medicine

## 2022-07-03 DIAGNOSIS — K222 Esophageal obstruction: Secondary | ICD-10-CM

## 2022-07-03 DIAGNOSIS — K219 Gastro-esophageal reflux disease without esophagitis: Secondary | ICD-10-CM

## 2022-07-03 DIAGNOSIS — K2101 Gastro-esophageal reflux disease with esophagitis, with bleeding: Secondary | ICD-10-CM

## 2022-07-03 DIAGNOSIS — K2970 Gastritis, unspecified, without bleeding: Secondary | ICD-10-CM

## 2022-07-24 ENCOUNTER — Telehealth: Payer: Self-pay | Admitting: Pharmacist

## 2022-07-24 NOTE — Chronic Care Management (AMB) (Signed)
    Chronic Care Management Pharmacy Assistant   Name: Jared [name]  MRN: [id number] DOB: July 03, 1946  07/28/2022 APPOINTMENT REMINDER  Jared [name] was reminded to have all medications, supplements and any blood glucose and blood pressure readings available for review with Jared [name], Pharm. D, at his telephone visit on 07/28/2022 at 11:30.  Care Gaps: AWV - complete 09/10/2021 Last BP - 122/98 on 04/22/2022 Last A1C - 5.7 on 01/09/2022 Tdap - overdue Covid booster - overdue Shingrix - overdue Foot exam  - overdue HGA1C - overdue Flu - due  Star Rating Drug: Atorvastatin 40 mg  - last filled 07/18/2022 [facility] Farxiga 5 mg - last filled 06/03/2022 [facility] at [facility] Losartan 25 mg - last filled [facility]/25/2022 [facility] at [facility]  Any gaps in medications fill history? No  Jared [name] [facility]  Catering manager [phone]

## 2022-07-25 NOTE — Progress Notes (Deleted)
Chronic Care Management Pharmacy Note  07/25/2022 Name:  Jared [name] MRN:  [id number] DOB:  1946-03-03  Summary: Uric acid not at goal < 6 and pt reports recent gout flare ups LDL not at goal < 70  Recommendations/Changes made from today's visit: -Recommended for pt to take hydralazine TID as prescribed -Recommend increasing to high intensity atorvastatin 40 mg  -Recommend increasing allopurinol to target uric acid < 6   Plan: Follow up BP assessment in 4 weeks Scheduled PCP visit for itchy legs   Subjective: Jared [name] is an 76 y.o. year old male who is a primary patient of Dorothyann Peng, NP.  The CCM team was consulted for assistance with disease management and care coordination needs.    Engaged with patient by telephone for follow up visit in response to provider referral for pharmacy case management and/or care coordination services.   Consent to Services:  The patient was given information about Chronic Care Management services, agreed to services, and gave verbal consent prior to initiation of services.  Please see initial visit note for detailed documentation.   Patient Care Team: Dorothyann Peng, NP as PCP - General (Family Medicine) Lenis Dickinson, MD (Inactive) as Referring Physician (Neurology) Ralene Bathe, MD as Consulting Physician (Ophthalmology) Elmarie Shiley, MD as Consulting Physician (Nephrology) Viona Gilmore, [facility] as Pharmacist (Pharmacist)  Recent office visits: 05/06/22 Glenna Durand, LPN: Patient presented for AWV.  04/22/22 Dorothyann Peng, NP: Patient presented for hyperlipidemia. Patient started experiencing a rash with simvastatin. Patient was taking both simvastatin and atorvastatin. Advised to only take atorvastatin.  Recent consult visits: 10/15/20 Elmarie Shiley, MD (nephrology): Patient presented for follow up. Unable to access notes.  Hospital visits: None in previous 6 months  Objective:  Lab Results  Component Value Date    CREATININE 2.77 (H) 01/09/2022   BUN 26 (H) 01/09/2022   GFR 22.03 (L) 01/09/2022   GFRNONAA 20 (L) 08/23/2020   GFRAA 23 (L) 08/23/2020   NA 141 01/09/2022   K 3.9 01/09/2022   CALCIUM 9.1 01/09/2022   CO2 25 01/09/2022   GLUCOSE 144 (H) 01/09/2022    Lab Results  Component Value Date/Time   HGBA1C 5.7 (A) 01/09/2022 11:00 AM   HGBA1C 6.2 09/19/2021 11:47 AM   HGBA1C 5.6 06/25/2021 01:09 PM   HGBA1C 5.7 (H) 08/23/2020 02:21 PM   HGBA1C 5.8 02/22/2019 03:31 PM   HGBA1C 5.5 08/24/2018 02:43 PM   GFR 22.03 (L) 01/09/2022 11:23 AM   GFR 25.21 (L) 09/19/2021 11:47 AM   MICROALBUR 0.8 06/27/2014 04:05 PM    Last diabetic Eye exam:  Lab Results  Component Value Date/Time   HMDIABEYEEXA No Retinopathy 03/17/2018 12:00 AM    Last diabetic Foot exam: No results found for: "HMDIABFOOTEX"   Lab Results  Component Value Date   CHOL 158 09/19/2021   HDL 37.50 (L) 09/19/2021   LDLCALC 96 09/19/2021   LDLDIRECT 85.1 06/03/2013   TRIG 124.0 09/19/2021   CHOLHDL 4 09/19/2021       Latest Ref Rng & Units 01/09/2022   11:23 AM 09/19/2021   11:47 AM 08/23/2020    2:21 PM  Hepatic Function  Total Protein 6.0 - 8.3 g/dL 7.6  7.5  7.1   Albumin 3.5 - 5.2 g/dL 4.2  4.4    AST 0 - 37 U/L '18  21  21   ' ALT 0 - 53 U/L '16  16  15   ' Alk Phosphatase 39 - 117 U/L 78  72    Total Bilirubin 0.2 - 1.2 mg/dL 0.5  0.5  0.5     Lab Results  Component Value Date/Time   TSH 0.95 09/19/2021 11:47 AM   TSH 0.96 08/23/2020 02:21 PM       Latest Ref Rng & Units 09/19/2021   11:47 AM 08/23/2020    2:21 PM 06/30/2019   10:29 AM  CBC  WBC 4.0 - 10.5 K/uL 5.0  5.8  6.5   Hemoglobin 13.0 - 17.0 g/dL 13.3  12.7  12.4   Hematocrit 39.0 - 52.0 % 40.1  36.8  37.1   Platelets 150.0 - 400.0 K/uL 196.0  229  243.0     Lab Results  Component Value Date/Time   VD25OH 29.5 10/05/2018 12:00 AM   VD25OH 27.4 09/13/2018 12:00 AM    Clinical ASCVD: Yes  The 10-year ASCVD risk score (Arnett DK, et al., 2019)  is: 45.9%   Values used to calculate the score:     Age: 30 years     Sex: Male     Is Non-Hispanic African American: Yes     Diabetic: Yes     Tobacco smoker: No     Systolic Blood Pressure: 601 mmHg     Is BP treated: Yes     HDL Cholesterol: 37.5 mg/dL     Total Cholesterol: 158 mg/dL       05/06/2022   12:13 PM 09/19/2021   11:07 AM 08/23/2020    1:55 PM  Depression screen PHQ 2/9  Decreased Interest 0 0 2  Down, Depressed, Hopeless 1 0 0  PHQ - 2 Score 1 0 2      Social History   Tobacco Use  Smoking Status Never  Smokeless Tobacco Never   BP Readings from Last 3 Encounters:  05/06/22 (!) 150/82  04/22/22 (!) 122/98  01/09/22 132/82   Pulse Readings from Last 3 Encounters:  05/06/22 72  04/22/22 67  01/09/22 69   Wt Readings from Last 3 Encounters:  05/06/22 236 lb 12.8 oz (107.4 kg)  04/22/22 233 lb (105.7 kg)  01/09/22 221 lb (100.2 kg)   BMI Readings from Last 3 Encounters:  05/06/22 36.01 kg/m  04/22/22 35.95 kg/m  01/09/22 34.10 kg/m    Assessment/Interventions: Review of patient past medical history, allergies, medications, health status, including review of consultants reports, laboratory and other test data, was performed as part of comprehensive evaluation and provision of chronic care management services.   SDOH:  (Social Determinants of Health) assessments and interventions performed: Yes    SDOH Screenings   Alcohol Screen: Not on file  Depression (PHQ2-9): Low Risk  (05/06/2022)   Depression (PHQ2-9)    PHQ-2 Score: 1  Financial Resource Strain: Low Risk  (05/06/2022)   Overall Financial Resource Strain (CARDIA)    Difficulty of Paying Living Expenses: Not hard at all  Food Insecurity: No Food Insecurity (05/06/2022)   Hunger Vital Sign    Worried About Running Out of Food in the Last Year: Never true    Ran Out of Food in the Last Year: Never true  Housing: Not on file  Physical Activity: Inactive (05/06/2022)   Exercise Vital Sign     Days of Exercise per Week: 0 days    Minutes of Exercise per Session: 0 min  Social Connections: Not on file  Stress: No Stress Concern Present (05/06/2022)   [place]    Feeling of Stress :  Only a little  Tobacco Use: Low Risk  (05/06/2022)   Patient History    Smoking Tobacco Use: Never    Smokeless Tobacco Use: Never    Passive Exposure: Not on file  Transportation Needs: No Transportation Needs (05/06/2022)   PRAPARE - Transportation    Lack of Transportation (Medical): No    Lack of Transportation (Non-Medical): No    CCM Care Plan  Allergies  Allergen Reactions   Strawberry Extract Shortness Of Breath    Medications Reviewed Today     Reviewed by Kellie Simmering, LPN (Licensed Practical Nurse) on 05/06/22 at 54  Med List Status: <None>   Medication Order Taking? Sig Documenting Provider Last Dose Status Informant  allopurinol (ZYLOPRIM) 100 MG tablet [id number] Yes TAKE 1 TABLET BY MOUTH TWICE A DAY Nafziger, Cory, NP Taking Active   amLODipine (NORVASC) 10 MG tablet [id number] Yes TAKE 1 TABLET BY MOUTH EVERY DAY Nafziger, Cory, NP Taking Active   aspirin EC 81 MG tablet [id number] Yes Take 81 mg by mouth daily. Swallow whole. [provider] Taking Active   atorvastatin (LIPITOR) 40 MG tablet [id number] Yes Take 1 tablet (40 mg total) by mouth daily. Nafziger, Tommi Rumps, NP Taking Active   blood glucose meter kit and supplies KIT [id number] Yes Dispense based on patient and insurance preference. Use up to three times daily as directed. Nafziger, Tommi Rumps, NP Taking Active   Cholecalciferol (VITAMIN D-3) 125 MCG (5000 UT) TABS [id number] Yes Take 1 tablet by mouth daily. [provider] Taking Active   dapagliflozin propanediol (FARXIGA) 5 MG TABS tablet [id number] No Take 1 tablet (5 mg total) by mouth daily before breakfast.  Patient not taking: Reported on 05/06/2022   Dorothyann Peng, NP Not Taking  Active   hydrALAZINE (APRESOLINE) 25 MG tablet [id number]  Take 1 tablet (25 mg total) by mouth 3 (three) times daily. Dorothyann Peng, NP  Expired 01/09/22 2359   isosorbide dinitrate (ISORDIL) 20 MG tablet [id number]  Take 1 tablet (20 mg total) by mouth 3 (three) times daily. Dorothyann Peng, NP  Expired 12/18/21 2359   labetalol (NORMODYNE) 200 MG tablet [id number] Yes Take 2 tablets (400 mg total) by mouth 2 (two) times daily. Nafziger, Tommi Rumps, NP Taking Active   losartan (COZAAR) 25 MG tablet [id number] Yes TAKE 1 TABLET (25 MG TOTAL) BY MOUTH DAILY. Nafziger, Tommi Rumps, NP Taking Active   pantoprazole (PROTONIX) 40 MG tablet [id number] Yes TAKE 1 TABLET BY MOUTH TWICE A DAY Irene Shipper, MD Taking Active             Patient Active Problem List   Diagnosis Date Noted   Stage 3b chronic kidney disease ([place]) 08/23/2020   Hypertensive emergency 12/14/2018   AKI (acute kidney injury) ([place])    Hyperlipidemia 05/26/2015   OSA on CPAP 11/13/2013   CVA (cerebral infarction) 01/21/2013   Diabetes mellitus ([place]) 01/21/2013   Knee pain, bilateral 02/19/2011   PLANTAR FASCIITIS, BILATERAL 04/19/2010   UNSPECIFIED DISORDER OF LIPOID METABOLISM 11/08/2009   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   PROSTATITIS, RECURRENT 11/08/2009   URINARY FREQUENCY, CHRONIC 03/10/2008   OSTEOARTHRITIS 09/28/2007   GOUT 07/26/2007   Essential hypertension 07/26/2007   Disorder resulting from impaired renal function 07/26/2007    Immunization History  Administered Date(s) Administered   Fluad Quad(high Dose 65+) 09/16/2019, 09/16/2019, 10/20/2020, 09/19/2021   Influenza Whole 12/22/2005, 10/02/2008, 11/08/2009   Influenza, High Dose Seasonal PF 01/25/2018, 12/01/2018   Influenza,inj,Quad PF,6+ Mos 09/28/2013   Moderna  Sars-Covid-2 Vaccination 02/03/2020, 03/02/2020   PFIZER(Purple Top)SARS-COV-2 Vaccination 12/06/2020   Pneumococcal Conjugate-13 10/23/2014   Pneumococcal Polysaccharide-23 06/02/2016    Td 12/22/2005   Zoster Recombinat (Shingrix) 09/23/2021   Patient reports his biggest concern right now is with his legs being itchy. He said this started last week and isn't sure what the cause is. He reported the only change with his medications have been that the isosorbide is now a green tablet and it was not prior to that. He denies any swelling or redness.  Patient reports he has also had some symptoms of gout lately. Discussed how this could be related to increased uric acid level and educated on foods that can increase his risk.  -what happened with pap for [name]? Getting jardiance too?  -need PCP visit? Due for annual exam after 9/29  -simvastatin also getting filled?   Conditions to be addressed/monitored:  Hypertension, Hyperlipidemia, Diabetes, GERD, Chronic Kidney Disease, Osteoarthritis and Gout  Conditions addressed this encounter: Hyperlipidemia, gout, hypertension  There are no care plans that you recently modified to display for this patient.     Medication Assistance: None required.  Patient affirms current coverage meets needs.  Compliance/Adherence/Medication fill history: Care Gaps: Shingrix, tetanus, COVID booster, foot exam, A1c Last BP - 1122/98 on 04/22/2022 Last A1C - 5.7 on 01/09/2022  Star-Rating Drugs: Atorvastatin 40 mg  - [facility] Jardiance 10 mg - last filled [facility]/03/2022 [facility] Losartan 25 mg - last filled [facility]/2022 [facility]  Patient's preferred pharmacy is:  [id number] [place], [place]Phone: [phone]: [phone] [facility] [place] [address] [place]: [phone]: [phone] [facility] [place]Phone:  [phone]: [phone]  Uses pill box? No - lines up medications Pt endorses 99% compliance (misses once a month)  We discussed: Current pharmacy is preferred with insurance plan and patient is satisfied with pharmacy services Patient decided to: Continue current medication management strategy  Care Plan and Follow Up Patient Decision:  Patient agrees to Care Plan and Follow-up.  Plan: Telephone follow up appointment with care management team member scheduled for:  4 months  MJeni Salles PharmD BNew StraitsvillePharmacist [phone]

## 2022-07-28 ENCOUNTER — Telehealth: Payer: Medicare Other

## 2022-07-28 ENCOUNTER — Telehealth: Payer: Self-pay | Admitting: Pharmacist

## 2022-07-28 NOTE — Telephone Encounter (Signed)
  Chronic Care Management   Outreach Note  07/28/2022 Name: Jared [name] MRN: [id number] DOB: 1946/10/01  Referred by: Dorothyann Peng, NP  Patient had a phone appointment scheduled with clinical pharmacist today.  An unsuccessful telephone outreach was attempted today. The patient was referred to the pharmacist for assistance with care management and care coordination.   If possible, a message was left to return call to: [phone] or to [facility] at [facility]: [place], PharmD, [place] Pharmacist [place] at [place]

## 2022-08-02 ENCOUNTER — Other Ambulatory Visit: Payer: Self-pay | Admitting: Adult Health

## 2022-08-07 ENCOUNTER — Encounter (HOSPITAL_COMMUNITY): Payer: Self-pay

## 2022-08-07 ENCOUNTER — Other Ambulatory Visit: Payer: Self-pay

## 2022-08-07 ENCOUNTER — Emergency Department (HOSPITAL_COMMUNITY)
Admission: EM | Admit: 2022-08-07 | Discharge: 2022-08-07 | Disposition: A | Discharge status: Home / Self Care | Payer: Medicare PPO | Attending: Emergency Medicine | Admitting: Emergency Medicine

## 2022-08-07 DIAGNOSIS — W57XXXA Bitten or stung by nonvenomous insect and other nonvenomous arthropods, initial encounter: Secondary | ICD-10-CM | POA: Diagnosis not present

## 2022-08-07 DIAGNOSIS — H9201 Otalgia, right ear: Secondary | ICD-10-CM | POA: Diagnosis not present

## 2022-08-07 DIAGNOSIS — Z79899 Other long term (current) drug therapy: Secondary | ICD-10-CM | POA: Insufficient documentation

## 2022-08-07 DIAGNOSIS — Z7982 Long term (current) use of aspirin: Secondary | ICD-10-CM | POA: Insufficient documentation

## 2022-08-07 DIAGNOSIS — S0006XA Insect bite (nonvenomous) of scalp, initial encounter: Secondary | ICD-10-CM | POA: Diagnosis not present

## 2022-08-07 MED ORDER — BACITRACIN ZINC 500 UNIT/GM EX OINT
1.0000 | TOPICAL_OINTMENT | Freq: Two times a day (BID) | CUTANEOUS | 0 refills | Status: DC
Start: 2022-08-07 — End: 2022-12-02

## 2022-08-07 MED ORDER — BACITRACIN ZINC 500 UNIT/GM EX OINT
TOPICAL_OINTMENT | Freq: Two times a day (BID) | CUTANEOUS | Status: DC
  Administered 2022-08-07: 1 via TOPICAL
  Filled 2022-08-07: qty 0.9

## 2022-08-07 NOTE — Discharge Instructions (Addendum)
Note your work-up today was over consistent with insect bite as well as buildup of earwax in your right ear.  We will treat the insect bite with topical antibiotics sent into local pharmacy.  I will also recommend an over-the-counter solution called Debrox to use in both your ears as instructed on the labeling.  He is instructed to read in the shower and wash her ears as indicated.  Avoid alcohol or vinegar on your wound to allow for proper healing.  Change bandages at least once a day.  I recommend reevaluation by your primary care provider in 3 to 5 days to make sure symptoms are getting better.  Please do not hesitate to return to the emergency department if the worrisome signs and symptoms we discussed become apparent.

## 2022-08-07 NOTE — ED Triage Notes (Signed)
Pt states he was bitten by a bug over a week ago on his scalp. Slight redness noted. Pt does reports some pain and soreness to right ear.

## 2022-08-07 NOTE — ED Provider Notes (Signed)
[facility] DEPT Provider Note   CSN: [id number] Arrival date & time: 08/07/22  1348     History  Chief Complaint  Patient presents with   Insect Bite    Jared [name] is a 76 y.o. male.  HPI   76 year old male presents emergency department with complaints of bug bite.  Patient states that he noticed a bug bite on the right side of his scalp approximately 1 week ago.  He has been trying at home therapies with vinegar as well as alcohol on affected area to "draw out pus."  He is unsure of what exact insect bit him.  Area slightly tender to the touch pain denies drainage from affected area.  Is now complaining of right-sided ear pain.  He has not noticed any rash in affected area.  Denies fever, chills, night sweats, congestion, nasal drainage, sore throat, chest pain, shortness of breath . Past medical history significant for hypertension, gout, diabetes mellitus type 2, CKD 3B,  Home Medications Prior to Admission medications   Medication Sig Start Date End Date Taking? Authorizing Provider  bacitracin ointment Apply 1 Application topically 2 (two) times daily. 08/07/22  Yes Dion Saucier A, PA  allopurinol (ZYLOPRIM) 100 MG tablet TAKE 1 TABLET BY MOUTH TWICE A DAY 08/27/21   Nafziger, Tommi Rumps, NP  amLODipine (NORVASC) 10 MG tablet TAKE 1 TABLET BY MOUTH EVERY DAY 03/10/22   Nafziger, Tommi Rumps, NP  aspirin EC 81 MG tablet Take 81 mg by mouth daily. Swallow whole.    [provider]  atorvastatin (LIPITOR) 40 MG tablet Take 1 tablet (40 mg total) by mouth daily. 01/09/22   Nafziger, Tommi Rumps, NP  blood glucose meter kit and supplies KIT Dispense based on patient and insurance preference. Use up to three times daily as directed. 03/19/21   Nafziger, Tommi Rumps, NP  Cholecalciferol (VITAMIN D-3) 125 MCG (5000 UT) TABS Take 1 tablet by mouth daily.    [provider]  dapagliflozin propanediol (FARXIGA) 5 MG TABS tablet Take 1 tablet (5 mg total) by mouth  daily before breakfast. Patient not taking: Reported on 05/06/2022 12/10/21   Dorothyann Peng, NP  hydrALAZINE (APRESOLINE) 25 MG tablet Take 1 tablet (25 mg total) by mouth 3 (three) times daily. 03/19/21 01/09/22  Nafziger, Tommi Rumps, NP  isosorbide dinitrate (ISORDIL) 20 MG tablet Take 1 tablet (20 mg total) by mouth 3 (three) times daily. 09/19/21 12/18/21  Nafziger, Tommi Rumps, NP  labetalol (NORMODYNE) 200 MG tablet Take 2 tablets (400 mg total) by mouth 2 (two) times daily. 12/17/21   Nafziger, Tommi Rumps, NP  losartan (COZAAR) 25 MG tablet TAKE 1 TABLET (25 MG TOTAL) BY MOUTH DAILY. 08/04/22   Nafziger, Tommi Rumps, NP  pantoprazole (PROTONIX) 40 MG tablet Take 1 tablet (40 mg total) by mouth 2 (two) times daily. Office visit for further refills 07/03/22   Irene Shipper, MD      Allergies    Strawberry extract    Review of Systems   Review of Systems  All other systems reviewed and are negative.   Physical Exam Updated Vital Signs BP (!) 156/94 (BP Location: Left Arm)   Pulse 62   Temp 98.5 F (36.9 C) (Oral)   Resp 18   Ht 5' 8" (1.727 m)   Wt 105.2 kg   SpO2 97%   BMI 35.28 kg/m  Physical Exam Vitals and nursing note reviewed.  Constitutional:      General: He is not in acute distress.    Appearance:  He is well-developed.  HENT:     Head: Normocephalic.     Comments: Small pinpoint scab noted on patient's right posterior scalp with mild surrounding erythema.  No obvious fluctuance or indurated tissue.  Area of erythema extends 5 mm away from pinpoint scab.  No obvious ecchymosis or necrotic tissue.  Patient has mild tenderness to palpation over affected area and no active drainage is noted.    Right Ear: Tympanic membrane, ear canal and external ear normal.     Left Ear: Tympanic membrane, ear canal and external ear normal.     Mouth/Throat:     Mouth: Mucous membranes are moist.     Pharynx: Oropharynx is clear.  Eyes:     Conjunctiva/sclera: Conjunctivae normal.  Cardiovascular:     Rate  and Rhythm: Normal rate and regular rhythm.     Heart sounds: No murmur heard. Pulmonary:     Effort: Pulmonary effort is normal. No respiratory distress.     Breath sounds: Normal breath sounds.  Abdominal:     Palpations: Abdomen is soft.     Tenderness: There is no abdominal tenderness.  Musculoskeletal:        General: No swelling.     Cervical back: Neck supple.  Skin:    General: Skin is warm and dry.     Capillary Refill: Capillary refill takes less than 2 seconds.  Neurological:     Mental Status: He is alert.  Psychiatric:        Mood and Affect: Mood normal.     ED Results / Procedures / Treatments   Labs (all labs ordered are listed, but only abnormal results are displayed) Labs Reviewed - No data to display  EKG None  Radiology No results found.  Procedures Procedures    Medications Ordered in ED Medications  bacitracin ointment (1 Application Topical Given 08/07/22 1530)    ED Course/ Medical Decision Making/ A&P                           Medical Decision Making Risk OTC drugs.   This patient presents to the ED for concern of insect bite, this involves an extensive number of treatment options, and is a complaint that carries with it a high risk of complications and morbidity.  The differential diagnosis includes therapy zoster, she may infection, otitis media, cellulitis, erysipelas, abscess, trigeminal neuralgia   Co morbidities that complicate the patient evaluation  See HPI   Additional history obtained:  Additional history obtained from EMR   Lab Tests:  N/a   Imaging Studies ordered:  N/a   Cardiac Monitoring: / EKG:  The patient was maintained on a cardiac monitor.  I personally viewed and interpreted the cardiac monitored which showed an underlying rhythm of: Sinus rhythm   Consultations Obtained:  N/a   Problem List / ED Course / Critical interventions / Medication management  Insect bite I ordered medication  including bacitracin for antibiotic therapy   Reevaluation of the patient after these medicines showed that the patient improved I have reviewed the patients home medicines and have made adjustments as needed   Social Determinants of Health:  Denies tobacco, illicit drug use   Test / Admission - Considered:  Insect bite Vitals signs significant for hypertension with a blood pressure 156/94.  Recommend close follow-up PCP regarding elevation of blood pressure.. Otherwise within normal range and stable throughout visit. Patient symptoms most likely secondary to insect bite  on patient's right scalp.  We will treat this with antibiotic ointment with daily dressing changes.  No concern at this time for cellulitic skin type changes.  Patient was advised to stop eating vinegar as well as rubbing alcohol on affected area to allow for proper healing.  Right-sided TM showed moderate cerumen buildup with no obvious deformities of TM on affected side.  Patient admitted at home Debrox therapy to help clear said cerumen.  Close follow-up with PCP recommended in 3 to 5 days for reevaluation patient's symptoms.  Treatment plan was discussed with the patient he knowledge understanding and was agreeable to said plan. Worrisome signs and symptoms were discussed with the patient, and the patient acknowledged understanding to return to the ED if noticed. Patient was stable upon discharge.         Final Clinical Impression(s) / ED Diagnoses Final diagnoses:  Insect bite of scalp, initial encounter    Rx / DC Orders ED Discharge Orders          Ordered    bacitracin ointment  2 times daily        08/07/22 1544              Wilnette Kales, [place] 08/07/22 [place], [place], DO 08/08/22 0018

## 2022-08-14 ENCOUNTER — Ambulatory Visit (INDEPENDENT_AMBULATORY_CARE_PROVIDER_SITE_OTHER): Payer: Medicare PPO | Admitting: Family Medicine

## 2022-08-14 ENCOUNTER — Encounter: Payer: Self-pay | Admitting: Family Medicine

## 2022-08-14 VITALS — BP 146/98 | HR 62 | Temp 98.6°F | Wt 230.4 lb

## 2022-08-14 DIAGNOSIS — W57XXXD Bitten or stung by nonvenomous insect and other nonvenomous arthropods, subsequent encounter: Secondary | ICD-10-CM

## 2022-08-14 DIAGNOSIS — I10 Essential (primary) hypertension: Secondary | ICD-10-CM | POA: Diagnosis not present

## 2022-08-14 DIAGNOSIS — S0006XD Insect bite (nonvenomous) of scalp, subsequent encounter: Secondary | ICD-10-CM

## 2022-08-14 DIAGNOSIS — L299 Pruritus, unspecified: Secondary | ICD-10-CM

## 2022-08-14 MED ORDER — FEXOFENADINE HCL 60 MG PO TABS: 60.0000 mg | ORAL_TABLET | Freq: Every day | ORAL | 0 refills | Status: DC | PRN

## 2022-08-14 MED ORDER — HYDROCORTISONE 1 % EX CREA: 1.0000 | TOPICAL_CREAM | Freq: Two times a day (BID) | CUTANEOUS | 0 refills | Status: DC | PRN

## 2022-08-14 NOTE — Progress Notes (Signed)
Subjective:    Patient ID: Jared [name], male    DOB: November 08, 1946, 76 y.o.   MRN: [id number]  Chief Complaint  Patient presents with   Follow-up    Spider bite on head. Swelling and redness have went away. Will have sharp pains in head in the early morning hours. Has been using alcohol and peroxide     HPI Patient was seen today for follow-up typically seen by Dorothyann Peng, NP.  Patient seen in ED on 8/17 for spider bite after walking into spiderweb.  Was given bacitracin ointment.  Patient states edema and erythema have decreased but area is still intermittently painful at night and pruritic.  Endorses dull headache.  Denies fever, chills, nausea, vomiting.  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   GERD (gastroesophageal reflux disease)    Gout 07/26/2007   "on daily RX"  (12/15/2018)   HYPERCHOLESTEROLEMIA, BORDERLINE 01/16/2010   HYPERTENSION 07/26/2007   OSA (obstructive sleep apnea)    "haven't used mask in quite awhile" (12/15/2018)   OSTEOARTHRITIS 09/28/2007   PLANTAR FASCIITIS, BILATERAL 04/19/2010   PROSTATITIS, RECURRENT 11/08/2009   RENAL INSUFFICIENCY 07/26/2007   Sleep apnea    off Cpap    Stroke ([place]) 01/21/2013   "fully recovered" (12/15/2018)   Type II diabetes mellitus ([place])    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   URINARY FREQUENCY, CHRONIC 03/10/2008    Allergies  Allergen Reactions   Strawberry Extract Shortness Of Breath    ROS General: Denies fever, chills, night sweats, changes in weight, changes in appetite   HEENT: Denies headaches, ear pain, changes in vision, rhinorrhea, sore throat +HA CV: Denies CP, palpitations, SOB, orthopnea Pulm: Denies SOB, cough, wheezing GI: Denies abdominal pain, nausea, vomiting, diarrhea, constipation GU: Denies dysuria, hematuria, frequency Msk: Denies muscle cramps, joint pains Neuro: Denies weakness, numbness, tingling Skin:  Denies rashes, bruising +spider bite on scalp, pruritis Psych: Denies depression, anxiety, hallucinations    Objective:    Blood pressure (!) 146/98, pulse 62, temperature 98.6 F (37 C), temperature source Oral, weight 230 lb 6.4 oz (104.5 kg), SpO2 98 %.  Gen. Pleasant, well-nourished, in no distress, normal affect   HEENT: [place]/AT, face symmetric, conjunctiva clear, no scleral icterus, PERRLA, EOMI, nares patent without drainage Lungs: no accessory muscle use Cardiovascular: RRR, no peripheral edema Neuro:  A&Ox3, CN II-XII intact, normal gait Skin:  Warm, dry, intact.  Faint erythema on parietal area of scalp, no punctum, pustules, papules noted.   Wt Readings from Last 3 Encounters:  08/14/22 230 lb 6.4 oz (104.5 kg)  08/07/22 232 lb (105.2 kg)  05/06/22 236 lb 12.8 oz (107.4 kg)    Lab Results  Component Value Date   WBC 5.0 09/19/2021   HGB 13.3 09/19/2021   HCT 40.1 09/19/2021   PLT 196.0 09/19/2021   GLUCOSE 144 (H) 01/09/2022   CHOL 158 09/19/2021   TRIG 124.0 09/19/2021   HDL 37.50 (L) 09/19/2021   LDLDIRECT 85.1 06/03/2013   LDLCALC 96 09/19/2021   ALT 16 01/09/2022   AST 18 01/09/2022   NA 141 01/09/2022   K 3.9 01/09/2022   CL 106 01/09/2022   CREATININE 2.77 (H) 01/09/2022   BUN 26 (H) 01/09/2022   CO2 25 01/09/2022   TSH 0.95 09/19/2021   PSA 1.61 09/19/2021   HGBA1C 5.7 (A) 01/09/2022   MICROALBUR 0.8 06/27/2014    Assessment/Plan:  Insect bite of scalp,  subsequent encounter  -Resolving -Continue supportive care -Hydrocortisone cream topically for pruritus.  Can also use Allegra if needed for pruritus. -Advised to keep area clean and dry. - Plan: hydrocortisone cream 1 %  Pruritus - Plan: hydrocortisone cream 1 %, fexofenadine (ALLEGRA ALLERGY) 60 MG tablet  Essential hypertension -Uncontrolled -Follow-up with PCP -Lifestyle modifications  F/u as needed for acute concern.  Grier Mitts, MD

## 2022-08-19 ENCOUNTER — Inpatient Hospital Stay: Payer: Medicare PPO | Admitting: Family Medicine

## 2022-09-26 ENCOUNTER — Other Ambulatory Visit: Payer: Self-pay | Admitting: Adult Health

## 2022-09-26 DIAGNOSIS — M1A00X1 Idiopathic chronic gout, unspecified site, with tophus (tophi): Secondary | ICD-10-CM

## 2022-09-26 DIAGNOSIS — N184 Chronic kidney disease, stage 4 (severe): Secondary | ICD-10-CM | POA: Diagnosis not present

## 2022-10-01 DIAGNOSIS — N184 Chronic kidney disease, stage 4 (severe): Secondary | ICD-10-CM | POA: Diagnosis not present

## 2022-10-01 DIAGNOSIS — D631 Anemia in chronic kidney disease: Secondary | ICD-10-CM | POA: Diagnosis not present

## 2022-10-01 DIAGNOSIS — N2581 Secondary hyperparathyroidism of renal origin: Secondary | ICD-10-CM | POA: Diagnosis not present

## 2022-10-01 DIAGNOSIS — I129 Hypertensive chronic kidney disease with stage 1 through stage 4 chronic kidney disease, or unspecified chronic kidney disease: Secondary | ICD-10-CM | POA: Diagnosis not present

## 2022-10-01 DIAGNOSIS — N401 Enlarged prostate with lower urinary tract symptoms: Secondary | ICD-10-CM | POA: Diagnosis not present

## 2022-10-01 DIAGNOSIS — R351 Nocturia: Secondary | ICD-10-CM | POA: Diagnosis not present

## 2022-10-04 ENCOUNTER — Other Ambulatory Visit: Payer: Self-pay | Admitting: Adult Health

## 2022-10-04 ENCOUNTER — Other Ambulatory Visit: Payer: Self-pay | Admitting: Internal Medicine

## 2022-10-04 DIAGNOSIS — K219 Gastro-esophageal reflux disease without esophagitis: Secondary | ICD-10-CM

## 2022-10-04 DIAGNOSIS — M1A00X1 Idiopathic chronic gout, unspecified site, with tophus (tophi): Secondary | ICD-10-CM

## 2022-10-04 DIAGNOSIS — K222 Esophageal obstruction: Secondary | ICD-10-CM

## 2022-10-04 DIAGNOSIS — K2970 Gastritis, unspecified, without bleeding: Secondary | ICD-10-CM

## 2022-10-04 DIAGNOSIS — M1A09X0 Idiopathic chronic gout, multiple sites, without tophus (tophi): Secondary | ICD-10-CM

## 2022-10-04 DIAGNOSIS — E1121 Type 2 diabetes mellitus with diabetic nephropathy: Secondary | ICD-10-CM

## 2022-10-04 DIAGNOSIS — E782 Mixed hyperlipidemia: Secondary | ICD-10-CM

## 2022-10-04 DIAGNOSIS — N1832 Chronic kidney disease, stage 3b: Secondary | ICD-10-CM

## 2022-10-04 DIAGNOSIS — Z0000 Encounter for general adult medical examination without abnormal findings: Secondary | ICD-10-CM

## 2022-10-04 DIAGNOSIS — I10 Essential (primary) hypertension: Secondary | ICD-10-CM

## 2022-10-04 DIAGNOSIS — K2101 Gastro-esophageal reflux disease with esophagitis, with bleeding: Secondary | ICD-10-CM

## 2022-10-06 NOTE — Telephone Encounter (Signed)
Patient needs an appointment for future refills. 

## 2022-10-09 ENCOUNTER — Telehealth: Payer: Self-pay | Admitting: Adult Health

## 2022-10-09 NOTE — Telephone Encounter (Signed)
Is pt suppose to take medication

## 2022-10-09 NOTE — Telephone Encounter (Signed)
Pt has questions as to why he is still taking isosorbide dinitrate (ISORDIL) 20 MG tablet atorvastatin (LIPITOR) 40 MG tablet. Please give patient a call

## 2022-10-10 NOTE — Telephone Encounter (Signed)
Patient notified of update  and verbalized understanding. 

## 2022-10-23 ENCOUNTER — Other Ambulatory Visit: Payer: Self-pay | Admitting: Internal Medicine

## 2022-10-23 DIAGNOSIS — K2970 Gastritis, unspecified, without bleeding: Secondary | ICD-10-CM

## 2022-10-23 DIAGNOSIS — K219 Gastro-esophageal reflux disease without esophagitis: Secondary | ICD-10-CM

## 2022-10-23 DIAGNOSIS — K2101 Gastro-esophageal reflux disease with esophagitis, with bleeding: Secondary | ICD-10-CM

## 2022-10-23 DIAGNOSIS — K222 Esophageal obstruction: Secondary | ICD-10-CM

## 2022-10-30 ENCOUNTER — Other Ambulatory Visit: Payer: Self-pay | Admitting: Internal Medicine

## 2022-10-30 DIAGNOSIS — K2970 Gastritis, unspecified, without bleeding: Secondary | ICD-10-CM

## 2022-10-30 DIAGNOSIS — K2101 Gastro-esophageal reflux disease with esophagitis, with bleeding: Secondary | ICD-10-CM

## 2022-10-30 DIAGNOSIS — K219 Gastro-esophageal reflux disease without esophagitis: Secondary | ICD-10-CM

## 2022-10-30 DIAGNOSIS — K222 Esophageal obstruction: Secondary | ICD-10-CM

## 2022-12-02 ENCOUNTER — Other Ambulatory Visit: Payer: Self-pay | Admitting: Adult Health

## 2022-12-02 ENCOUNTER — Ambulatory Visit (INDEPENDENT_AMBULATORY_CARE_PROVIDER_SITE_OTHER): Payer: Medicare PPO | Admitting: Adult Health

## 2022-12-02 ENCOUNTER — Encounter: Payer: Self-pay | Admitting: Adult Health

## 2022-12-02 ENCOUNTER — Telehealth: Payer: Self-pay | Admitting: Adult Health

## 2022-12-02 VITALS — BP 180/100 | Temp 97.9°F | Ht 67.25 in | Wt 232.0 lb

## 2022-12-02 DIAGNOSIS — N400 Enlarged prostate without lower urinary tract symptoms: Secondary | ICD-10-CM

## 2022-12-02 DIAGNOSIS — M1A09X0 Idiopathic chronic gout, multiple sites, without tophus (tophi): Secondary | ICD-10-CM

## 2022-12-02 DIAGNOSIS — Z0000 Encounter for general adult medical examination without abnormal findings: Secondary | ICD-10-CM | POA: Diagnosis not present

## 2022-12-02 DIAGNOSIS — I10 Essential (primary) hypertension: Secondary | ICD-10-CM

## 2022-12-02 DIAGNOSIS — N184 Chronic kidney disease, stage 4 (severe): Secondary | ICD-10-CM | POA: Diagnosis not present

## 2022-12-02 DIAGNOSIS — E1121 Type 2 diabetes mellitus with diabetic nephropathy: Secondary | ICD-10-CM

## 2022-12-02 DIAGNOSIS — E782 Mixed hyperlipidemia: Secondary | ICD-10-CM

## 2022-12-02 LAB — CBC WITH DIFFERENTIAL/PLATELET
Basophils Absolute: 0.1 10*3/uL (ref 0.0–0.1)
Basophils Relative: 1 % (ref 0.0–3.0)
Eosinophils Absolute: 0.4 10*3/uL (ref 0.0–0.7)
Eosinophils Relative: 6.9 % — ABNORMAL HIGH (ref 0.0–5.0)
HCT: 38.8 % — ABNORMAL LOW (ref 39.0–52.0)
Hemoglobin: 13.2 g/dL (ref 13.0–17.0)
Lymphocytes Relative: 24.4 % (ref 12.0–46.0)
Lymphs Abs: 1.4 10*3/uL (ref 0.7–4.0)
MCHC: 34.2 g/dL (ref 30.0–36.0)
MCV: 90.8 fl (ref 78.0–100.0)
Monocytes Absolute: 0.7 10*3/uL (ref 0.1–1.0)
Monocytes Relative: 12.2 % — ABNORMAL HIGH (ref 3.0–12.0)
Neutro Abs: 3.2 10*3/uL (ref 1.4–7.7)
Neutrophils Relative %: 55.5 % (ref 43.0–77.0)
Platelets: 196 10*3/uL (ref 150.0–400.0)
RBC: 4.27 Mil/uL (ref 4.22–5.81)
RDW: 14.4 % (ref 11.5–15.5)
WBC: 5.8 10*3/uL (ref 4.0–10.5)

## 2022-12-02 LAB — PSA: PSA: 1.43 ng/mL (ref 0.10–4.00)

## 2022-12-02 LAB — TSH: TSH: 1.22 u[IU]/mL (ref 0.35–5.50)

## 2022-12-02 LAB — HEMOGLOBIN A1C: Hgb A1c MFr Bld: 6.6 % — ABNORMAL HIGH (ref 4.6–6.5)

## 2022-12-02 LAB — COMPREHENSIVE METABOLIC PANEL
ALT: 22 U/L (ref 0–53)
AST: 21 U/L (ref 0–37)
Albumin: 4.4 g/dL (ref 3.5–5.2)
Alkaline Phosphatase: 94 U/L (ref 39–117)
BUN: 28 mg/dL — ABNORMAL HIGH (ref 6–23)
CO2: 26 mEq/L (ref 19–32)
Calcium: 9.3 mg/dL (ref 8.4–10.5)
Chloride: 105 mEq/L (ref 96–112)
Creatinine, Ser: 2.89 mg/dL — ABNORMAL HIGH (ref 0.40–1.50)
GFR: 20.81 mL/min — ABNORMAL LOW (ref >60.00)
Glucose, Bld: 125 mg/dL — ABNORMAL HIGH (ref 70–99)
Potassium: 4.4 mEq/L (ref 3.5–5.1)
Sodium: 140 mEq/L (ref 135–145)
Total Bilirubin: 0.5 mg/dL (ref 0.2–1.2)
Total Protein: 7.2 g/dL (ref 6.0–8.3)

## 2022-12-02 LAB — LIPID PANEL
Cholesterol: 120 mg/dL (ref 0–200)
HDL: 34.7 mg/dL — ABNORMAL LOW (ref >39.00)
LDL Cholesterol: 60 mg/dL (ref 0–99)
NonHDL: 85.47
Total CHOL/HDL Ratio: 3
Triglycerides: 128 mg/dL (ref 0.0–149.0)
VLDL: 25.6 mg/dL (ref 0.0–40.0)

## 2022-12-02 LAB — URIC ACID: Uric Acid, Serum: 7 mg/dL (ref 4.0–7.8)

## 2022-12-02 MED ORDER — EMPAGLIFLOZIN 10 MG PO TABS: 10.0000 mg | ORAL_TABLET | Freq: Every day | ORAL | 0 refills | Status: DC

## 2022-12-02 MED ORDER — DAPAGLIFLOZIN PROPANEDIOL 5 MG PO TABS: 5.0000 mg | ORAL_TABLET | Freq: Every day | ORAL | 1 refills | Status: DC

## 2022-12-02 MED ORDER — HYDRALAZINE HCL 25 MG PO TABS: 25.0000 mg | ORAL_TABLET | Freq: Three times a day (TID) | ORAL | 3 refills | Status: DC

## 2022-12-02 NOTE — Progress Notes (Signed)
Subjective:    Patient ID: Jared [name], male    DOB: Jan 15, 1946, 76 y.o.   MRN: [id number]  HPI Patient presents for yearly preventative medicine examination. He is a 76 year old male who  has a past medical history of Childhood asthma, CHRONIC GOUTY ARTHROPATHY WITH TOPHUS (11/08/2009), GERD (gastroesophageal reflux disease), Gout (07/26/2007), HYPERCHOLESTEROLEMIA, BORDERLINE (01/16/2010), HYPERTENSION (07/26/2007), OSA (obstructive sleep apnea), OSTEOARTHRITIS (09/28/2007), PLANTAR FASCIITIS, BILATERAL (04/19/2010), PROSTATITIS, RECURRENT (11/08/2009), RENAL INSUFFICIENCY (07/26/2007), Sleep apnea, Stroke ([place]) (01/21/2013), Type II diabetes mellitus ([place]), Unspecified disorder of lipoid metabolism (11/08/2009), and URINARY FREQUENCY, CHRONIC (03/10/2008).  DM Type 2 -currently managed with Farxiga 5 mg dail ( has not been taking, reports he ran out of medication and never came in to get it refilled).  In the past he was on metformin but in controlling his A1c.  He does not monitor his blood sugars at home.  Denies episodes of hypoglycemia Lab Results  Component Value Date   HGBA1C 5.7 (A) 01/09/2022   HTN -managed with lisinopril 10 mg twice daily, labetalol 200 mg twice daily, isosorbide 20 mg 3 times daily, hydralazine 25 mg 3 times daily, and Norvasc 10 mg daily.  He denies dizziness, lightheadedness, chest pain, or shortness of breath. He did not take his blood pressure medication this morning  BP Readings from Last 3 Encounters:  12/02/22 (!) 180/100  08/14/22 (!) 146/98  08/07/22 (!) 145/85   CKD4 -managed by nephrology.  He was last seen in October 2023 which time his BUN was 24, creatinine 2.54 and GFR 26.  Gout-managed with allopurinol 100 mg twice daily.  HLD-managed with atorvastatin 40 mg daily. He denies myalgia or fatigue  Lab Results  Component Value Date   CHOL 158 09/19/2021   HDL 37.50 (L) 09/19/2021   LDLCALC 96 09/19/2021   LDLDIRECT 85.1 06/03/2013   TRIG 124.0  09/19/2021   CHOLHDL 4 09/19/2021    BPH - controlled without medications   All immunizations and health maintenance protocols were reviewed with the patient and needed orders were placed.  Appropriate screening laboratory values were ordered for the patient including screening of hyperlipidemia, renal function and hepatic function. If indicated by BPH, a PSA was ordered.  Medication reconciliation,  past medical history, social history, problem list and allergies were reviewed in detail with the patient  Goals were established with regard to weight loss, exercise, and  diet in compliance with medications Wt Readings from Last 3 Encounters:  12/02/22 232 lb (105.2 kg)  08/14/22 230 lb 6.4 oz (104.5 kg)  08/07/22 232 lb (105.2 kg)    Review of Systems  Constitutional: Negative.   HENT: Negative.    Eyes: Negative.   Respiratory: Negative.    Cardiovascular: Negative.   Gastrointestinal: Negative.   Endocrine: Negative.   Genitourinary: Negative.   Musculoskeletal:  Positive for arthralgias.  Skin: Negative.   Allergic/Immunologic: Negative.   Neurological: Negative.   Hematological: Negative.   Psychiatric/Behavioral: Negative.    All other systems reviewed and are negative.  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   GERD (gastroesophageal reflux disease)    Gout 07/26/2007   "on daily RX"  (12/15/2018)   HYPERCHOLESTEROLEMIA, BORDERLINE 01/16/2010   HYPERTENSION 07/26/2007   OSA (obstructive sleep apnea)    "haven't used mask in quite awhile" (12/15/2018)   OSTEOARTHRITIS 09/28/2007   PLANTAR FASCIITIS, BILATERAL 04/19/2010   PROSTATITIS, RECURRENT 11/08/2009   RENAL  INSUFFICIENCY 07/26/2007   Sleep apnea    off Cpap    Stroke ([place]) 01/21/2013   "fully recovered" (12/15/2018)   Type II diabetes mellitus ([place])    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   URINARY FREQUENCY, CHRONIC  03/10/2008    Social History   Socioeconomic History   Marital status: Married    Spouse name: Callaway   Number of children: 5   Years of education: 12   Highest education level: Not on file  Occupational History   Occupation: TESTOR    Employer: [employer]    Comment: retired  Tobacco Use   Smoking status: Never   Smokeless tobacco: Never  Vaping Use   Vaping Use: Never used  Substance and Sexual Activity   Alcohol use: Not Currently   Drug use: Never   Sexual activity: Not Currently  Other Topics Concern   Not on file  Social History Narrative   He has a part time job for delivering car parts   Married    4 children - All in [place]      He likes to go to car shows and shoot pool.    Social Determinants of Health   Financial Resource Strain: Low Risk  (05/06/2022)   Overall Financial Resource Strain (CARDIA)    Difficulty of Paying Living Expenses: Not hard at all  Food Insecurity: No Food Insecurity (05/06/2022)   Hunger Vital Sign    Worried About Running Out of Food in the Last Year: Never true    Ran Out of Food in the Last Year: Never true  Transportation Needs: No Transportation Needs (05/06/2022)   PRAPARE - Hydrologist (Medical): No    Lack of Transportation (Non-Medical): No  Physical Activity: Inactive (05/06/2022)   Exercise Vital Sign    Days of Exercise per Week: 0 days    Minutes of Exercise per Session: 0 min  Stress: No Stress Concern Present (05/06/2022)   [place]    Feeling of Stress : Only a little  Social Connections: Not on file  Intimate Partner Violence: Not on file    Past Surgical History:  Procedure Laterality Date   COLONOSCOPY     CYST EXCISION Left    "leg"   TONSILLECTOMY     UPPER GASTROINTESTINAL ENDOSCOPY     WRIST SURGERY Right    "cut bone out; for gout""    Family History  Problem Relation Age of Onset   Heart disease Mother     Heart disease Father    CVA Father    Colon polyps Father    Diabetes Sister    Colon cancer Neg Hx    Esophageal cancer Neg Hx    Stomach cancer Neg Hx    Pancreatic cancer Neg Hx    Rectal cancer Neg Hx     Allergies  Allergen Reactions   Strawberry Extract Shortness Of Breath    Current Outpatient Medications on File Prior to Visit  Medication Sig Dispense Refill   allopurinol (ZYLOPRIM) 100 MG tablet TAKE 1 TABLET BY MOUTH TWICE A DAY 180 tablet 0   amLODipine (NORVASC) 10 MG tablet TAKE 1 TABLET BY MOUTH EVERY DAY 90 tablet 0   aspirin EC 81 MG tablet Take 81 mg by mouth daily. Swallow whole.     atorvastatin (LIPITOR) 40 MG tablet Take 1 tablet (40 mg total) by mouth daily. 90 tablet 3   blood  glucose meter kit and supplies KIT Dispense based on patient and insurance preference. Use up to three times daily as directed. 1 each 0   Cholecalciferol (VITAMIN D-3) 125 MCG (5000 UT) TABS Take 1 tablet by mouth daily.     dapagliflozin propanediol (FARXIGA) 5 MG TABS tablet Take 1 tablet (5 mg total) by mouth daily before breakfast. 90 tablet 3   hydrocortisone cream 1 % Apply 1 Application topically 2 (two) times daily as needed for itching. 14 g 0   isosorbide dinitrate (ISORDIL) 20 MG tablet TAKE 1 TABLET BY MOUTH THREE TIMES A DAY 270 tablet 3   labetalol (NORMODYNE) 200 MG tablet TAKE 2 TABLETS BY MOUTH 2 TIMES DAILY. 360 tablet 2   losartan (COZAAR) 25 MG tablet TAKE 1 TABLET (25 MG TOTAL) BY MOUTH DAILY. 90 tablet 0   pantoprazole (PROTONIX) 40 MG tablet TAKE 1 TABLET (40 MG TOTAL) BY MOUTH 2 (TWO) TIMES DAILY. OFFICE VISIT FOR FURTHER REFILLS 180 tablet 0   fexofenadine (ALLEGRA ALLERGY) 60 MG tablet Take 1 tablet (60 mg total) by mouth daily as needed (itching). (Patient not taking: Reported on 12/02/2022) 30 tablet 0   hydrALAZINE (APRESOLINE) 25 MG tablet Take 1 tablet (25 mg total) by mouth 3 (three) times daily. 270 tablet 3   No current facility-administered medications  on file prior to visit.    BP (!) 180/100   Temp 97.9 F (36.6 C) (Oral)   Ht 5' 7.25" (1.708 m)   Wt 232 lb (105.2 kg)   BMI 36.07 kg/m       Objective:   Physical Exam Vitals and nursing note reviewed.  Constitutional:      General: He is not in acute distress.    Appearance: Normal appearance. He is well-developed and normal weight.  HENT:     Head: Normocephalic and atraumatic.     Right Ear: Tympanic membrane, ear canal and external ear normal. There is no impacted cerumen.     Left Ear: Tympanic membrane, ear canal and external ear normal. There is no impacted cerumen.     Nose: Nose normal. No congestion or rhinorrhea.     Mouth/Throat:     Mouth: Mucous membranes are moist.     Pharynx: Oropharynx is clear. No oropharyngeal exudate or posterior oropharyngeal erythema.  Eyes:     General:        Right eye: No discharge.        Left eye: No discharge.     Extraocular Movements: Extraocular movements intact.     Conjunctiva/sclera: Conjunctivae normal.     Pupils: Pupils are equal, round, and reactive to light.  Neck:     Vascular: No carotid bruit.     Trachea: No tracheal deviation.  Cardiovascular:     Rate and Rhythm: Normal rate and regular rhythm.     Pulses: Normal pulses.     Heart sounds: Normal heart sounds. No murmur heard.    No friction rub. No gallop.  Pulmonary:     Effort: Pulmonary effort is normal. No respiratory distress.     Breath sounds: Normal breath sounds. No stridor. No wheezing, rhonchi or rales.  Chest:     Chest wall: No tenderness.  Abdominal:     General: Bowel sounds are normal. There is no distension.     Palpations: Abdomen is soft. There is no mass.     Tenderness: There is no abdominal tenderness. There is no right CVA tenderness, left CVA tenderness, guarding  or rebound.     Hernia: No hernia is present.  Musculoskeletal:        General: No swelling, tenderness, deformity or signs of injury. Normal range of motion.      Right lower leg: No edema.     Left lower leg: No edema.  Lymphadenopathy:     Cervical: No cervical adenopathy.  Skin:    General: Skin is warm and dry.     Capillary Refill: Capillary refill takes less than 2 seconds.     Coloration: Skin is not jaundiced or pale.     Findings: No bruising, erythema, lesion or rash.  Neurological:     General: No focal deficit present.     Mental Status: He is alert and oriented to person, place, and time.     Cranial Nerves: No cranial nerve deficit.     Sensory: No sensory deficit.     Motor: No weakness.     Coordination: Coordination normal.     Gait: Gait normal.     Deep Tendon Reflexes: Reflexes normal.  Psychiatric:        Mood and Affect: Mood normal.        Behavior: Behavior normal.        Thought Content: Thought content normal.        Judgment: Judgment normal.       Assessment & Plan:  1. Routine general medical examination at a health care facility - Encouraged to eat healthy and exercise - Follow up in one year or sooner if needed   2. Essential hypertension - Not at goal today d/t not taking medications  - CBC with Differential/Platelet; Future - Comprehensive metabolic panel; Future - Hemoglobin A1c; Future - Lipid panel; Future - TSH; Future - Microalbumin/Creatinine Ratio, Urine; Future  3. Mixed hyperlipidemia - Continue atorvastatin  - CBC with Differential/Platelet; Future - Comprehensive metabolic panel; Future - Hemoglobin A1c; Future - Lipid panel; Future - TSH; Future - Microalbumin/Creatinine Ratio, Urine; Future  4. Type 2 diabetes mellitus with diabetic nephropathy, without long-term current use of insulin ([place]) - Place back on Farxiga and have him follow up in three months  - CBC with Differential/Platelet; Future - Comprehensive metabolic panel; Future - Hemoglobin A1c; Future - Lipid panel; Future - TSH; Future  5. Chronic gout of multiple sites, unspecified cause  - Uric Acid;  Future  6. CKD (chronic kidney disease) stage 4, GFR 15-29 ml/min ([facility]) - Per nephrology  - CBC with Differential/Platelet; Future - Comprehensive metabolic panel; Future - Hemoglobin A1c; Future - Lipid panel; Future - TSH; Future  7. Benign prostatic hyperplasia without lower urinary tract symptoms  - PSA; Future  Dorothyann Peng, NP

## 2022-12-02 NOTE — Patient Instructions (Addendum)
It was great seeing you today   We will follow up with you regarding your lab work   Please let me know if you need anything   Follow up in 3 months for diabetic check

## 2022-12-02 NOTE — Telephone Encounter (Signed)
Pt is calling and dapagliflozin propanediol (FARXIGA) 5 MG TABS tablet  cost 500.00  for 90 day supply with his insurance and 30 day supply cost 350.00 . Pt can not afford the medication and would like something else sent to  [id number] [place], [place]: [phone] Fax: [phone]

## 2022-12-02 NOTE — Telephone Encounter (Signed)
Please advise 

## 2022-12-03 NOTE — Telephone Encounter (Signed)
Noted! Pt notified of update.

## 2022-12-04 NOTE — Addendum Note (Signed)
Addended by: Rosalyn Gess D on: 12/04/2022 04:31 PM   Modules accepted: Orders

## 2023-01-25 ENCOUNTER — Other Ambulatory Visit: Payer: Self-pay | Admitting: Internal Medicine

## 2023-01-25 DIAGNOSIS — K222 Esophageal obstruction: Secondary | ICD-10-CM

## 2023-01-25 DIAGNOSIS — K2101 Gastro-esophageal reflux disease with esophagitis, with bleeding: Secondary | ICD-10-CM

## 2023-01-25 DIAGNOSIS — K219 Gastro-esophageal reflux disease without esophagitis: Secondary | ICD-10-CM

## 2023-01-25 DIAGNOSIS — K2970 Gastritis, unspecified, without bleeding: Secondary | ICD-10-CM

## 2023-02-01 ENCOUNTER — Other Ambulatory Visit: Payer: Self-pay | Admitting: Adult Health

## 2023-02-02 ENCOUNTER — Other Ambulatory Visit: Payer: Self-pay | Admitting: Adult Health

## 2023-02-02 DIAGNOSIS — M1A00X1 Idiopathic chronic gout, unspecified site, with tophus (tophi): Secondary | ICD-10-CM

## 2023-02-02 DIAGNOSIS — E782 Mixed hyperlipidemia: Secondary | ICD-10-CM

## 2023-02-11 ENCOUNTER — Emergency Department (HOSPITAL_COMMUNITY): Payer: Medicare HMO

## 2023-02-11 ENCOUNTER — Emergency Department (HOSPITAL_COMMUNITY)
Admission: EM | Admit: 2023-02-11 | Discharge: 2023-02-11 | Disposition: A | Discharge status: Home / Self Care | Payer: Medicare HMO | Attending: Emergency Medicine | Admitting: Emergency Medicine

## 2023-02-11 DIAGNOSIS — R531 Weakness: Secondary | ICD-10-CM | POA: Diagnosis not present

## 2023-02-11 DIAGNOSIS — Y900 Blood alcohol level of less than 20 mg/100 ml: Secondary | ICD-10-CM | POA: Diagnosis not present

## 2023-02-11 DIAGNOSIS — N189 Chronic kidney disease, unspecified: Secondary | ICD-10-CM | POA: Insufficient documentation

## 2023-02-11 DIAGNOSIS — Z7984 Long term (current) use of oral hypoglycemic drugs: Secondary | ICD-10-CM | POA: Insufficient documentation

## 2023-02-11 DIAGNOSIS — M79605 Pain in left leg: Secondary | ICD-10-CM | POA: Diagnosis not present

## 2023-02-11 DIAGNOSIS — I129 Hypertensive chronic kidney disease with stage 1 through stage 4 chronic kidney disease, or unspecified chronic kidney disease: Secondary | ICD-10-CM | POA: Insufficient documentation

## 2023-02-11 DIAGNOSIS — Z79899 Other long term (current) drug therapy: Secondary | ICD-10-CM | POA: Insufficient documentation

## 2023-02-11 DIAGNOSIS — E1122 Type 2 diabetes mellitus with diabetic chronic kidney disease: Secondary | ICD-10-CM | POA: Insufficient documentation

## 2023-02-11 DIAGNOSIS — N179 Acute kidney failure, unspecified: Secondary | ICD-10-CM | POA: Insufficient documentation

## 2023-02-11 DIAGNOSIS — Z7982 Long term (current) use of aspirin: Secondary | ICD-10-CM | POA: Insufficient documentation

## 2023-02-11 LAB — I-STAT CHEM 8, ED
BUN: 30 mg/dL — ABNORMAL HIGH (ref 8–23)
Calcium, Ion: 1.17 mmol/L (ref 1.15–1.40)
Chloride: 107 mmol/L (ref 98–111)
Creatinine, Ser: 3.6 mg/dL — ABNORMAL HIGH (ref 0.61–1.24)
Glucose, Bld: 118 mg/dL — ABNORMAL HIGH (ref 70–99)
HCT: 38 % — ABNORMAL LOW (ref 39.0–52.0)
Hemoglobin: 12.9 g/dL — ABNORMAL LOW (ref 13.0–17.0)
Potassium: 4.3 mmol/L (ref 3.5–5.1)
Sodium: 143 mmol/L (ref 135–145)
TCO2: 23 mmol/L (ref 22–32)

## 2023-02-11 LAB — COMPREHENSIVE METABOLIC PANEL
ALT: 24 U/L (ref 0–44)
AST: 34 U/L (ref 15–41)
Albumin: 3.8 g/dL (ref 3.5–5.0)
Alkaline Phosphatase: 86 U/L (ref 38–126)
Anion gap: 11 (ref 5–15)
BUN: 27 mg/dL — ABNORMAL HIGH (ref 8–23)
CO2: 23 mmol/L (ref 22–32)
Calcium: 9 mg/dL (ref 8.9–10.3)
Chloride: 105 mmol/L (ref 98–111)
Creatinine, Ser: 3.35 mg/dL — ABNORMAL HIGH (ref 0.61–1.24)
GFR, Estimated: 19 mL/min — ABNORMAL LOW (ref >60)
Glucose, Bld: 121 mg/dL — ABNORMAL HIGH (ref 70–99)
Potassium: 4.3 mmol/L (ref 3.5–5.1)
Sodium: 139 mmol/L (ref 135–145)
Total Bilirubin: 0.7 mg/dL (ref 0.3–1.2)
Total Protein: 6.9 g/dL (ref 6.5–8.1)

## 2023-02-11 LAB — DIFFERENTIAL
Abs Immature Granulocytes: 0.02 10*3/uL (ref 0.00–0.07)
Basophils Absolute: 0.1 10*3/uL (ref 0.0–0.1)
Basophils Relative: 1 %
Eosinophils Absolute: 0.4 10*3/uL (ref 0.0–0.5)
Eosinophils Relative: 6 %
Immature Granulocytes: 0 %
Lymphocytes Relative: 25 %
Lymphs Abs: 1.6 10*3/uL (ref 0.7–4.0)
Monocytes Absolute: 0.7 10*3/uL (ref 0.1–1.0)
Monocytes Relative: 11 %
Neutro Abs: 3.6 10*3/uL (ref 1.7–7.7)
Neutrophils Relative %: 57 %

## 2023-02-11 LAB — CBC
HCT: 38.2 % — ABNORMAL LOW (ref 39.0–52.0)
Hemoglobin: 12.8 g/dL — ABNORMAL LOW (ref 13.0–17.0)
MCH: 31 pg (ref 26.0–34.0)
MCHC: 33.5 g/dL (ref 30.0–36.0)
MCV: 92.5 fL (ref 80.0–100.0)
Platelets: 192 10*3/uL (ref 150–400)
RBC: 4.13 MIL/uL — ABNORMAL LOW (ref 4.22–5.81)
RDW: 14.3 % (ref 11.5–15.5)
WBC: 6.3 10*3/uL (ref 4.0–10.5)
nRBC: 0 % (ref 0.0–0.2)

## 2023-02-11 LAB — PROTIME-INR
INR: 1 (ref 0.8–1.2)
Prothrombin Time: 13.4 seconds (ref 11.4–15.2)

## 2023-02-11 LAB — APTT: aPTT: 28 seconds (ref 24–36)

## 2023-02-11 LAB — ETHANOL: Alcohol, Ethyl (B): <10 mg/dL (ref <10)

## 2023-02-11 NOTE — ED Triage Notes (Signed)
Patient here for evaluation of sudden onset of left leg weakness and pain in lateral left knee starting at approximately 1330 today.

## 2023-02-11 NOTE — Discharge Instructions (Signed)
You were seen in the emergency department for your leg weakness.  Your workup showed no signs of stroke and your symptoms improved while you are in the emergency department.  This may be due to sciatica or nerve type of pain in your leg and you can follow-up with your primary doctor to have further workup done.  You can take Tylenol or Motrin as needed for pain.  Your kidney function was also slightly increased from her normal here and you can follow-up with your primary doctor to have your creatinine rechecked.  You should return to the emergency department if you are having worsening weakness or pain and unable to walk, you have numbness and weakness in your arm and leg, you have any difficulty speaking or if you have any other new or concerning symptoms.

## 2023-02-11 NOTE — ED Provider Triage Note (Signed)
Emergency Medicine Provider Triage Evaluation Note  Jared [name] , a 77 y.o. male  was evaluated in triage.  Pt complains of left lower extremity weakness and numbness beginning 1 hour prior to arrival.  Patient reports that he had stroke patient, had stroke in 2014.  Patient reports he has left-sided deficits secondary to his stroke in 2014.  Patient denies currently taking blood thinners.  Patient reports that 1 hour ago, when he got to work, he noticed that he had difficulty moving his left leg and as well as subjective numbness to his left leg.  Patient reports that this left leg numbness and weakness is beyond his baseline, new from previous stroke.  Patient reports that he also has some pain in his left leg however states that the weakness and numbness is more apparent.  Patient denies any slurred speech, facial droop, chest pain, shortness of breath.  Patient has 5/5 strength upper extremities, weakness to left lower extremity.  Patient evaluated by Dr. Maylon Peppers who agreed with calling code stroke.  Code stroke was activated at this time  Review of Systems  Positive:  Negative:   Physical Exam  There were no vitals taken for this visit. Gen:   Awake, no distress   Resp:  Normal effort  MSK:   Moves extremities without difficulty  Other:    Medical Decision Making  Medically screening exam initiated at 2:43 PM.  Appropriate orders placed.  [name] was informed that the remainder of the evaluation will be completed by another provider, this initial triage assessment does not replace that evaluation, and the importance of remaining in the ED until their evaluation is complete.     [name], PA-C 02/11/23 1444

## 2023-02-11 NOTE — ED Notes (Signed)
Patient currently in CT, this writer present. Patient last known normal @1230$  today. Patient able to ambulate WNL with assistance. C/O pain in left leg. Denies and numbness in left leg at this time. Mild drift to left leg. Will continue to monitor

## 2023-02-11 NOTE — ED Provider Notes (Signed)
[facility] Provider Note   CSN: [id number] Arrival date & time: 02/11/23  1429     History  Chief Complaint  Patient presents with   Weakness    Jared [name] is a 77 y.o. male.  Patient is a 77 year old male with a past medical history of hypertension, diabetes, CKD, prior CVA presenting to the emergency department with left leg weakness.  Patient states that he was on his way to work around 1330 and when he tried to get out of the car he felt like his left leg was weak and heavy and he is unable to move it.  He states that when he tried to lift up his leg he noticed that it felt numb.  He states that he also had a pain radiating up his left lateral shin and knee.  He denies any numbness or weakness in his left arm or face.  He states on his previous stroke he did have some left-sided weakness and issues with balance and coordination but states that those symptoms had resolved.  The history is provided by the patient.  Weakness      Home Medications Prior to Admission medications   Medication Sig Start Date End Date Taking? Authorizing Provider  allopurinol (ZYLOPRIM) 100 MG tablet TAKE 1 TABLET BY MOUTH TWICE A DAY 02/03/23   Nafziger, Tommi Rumps, NP  amLODipine (NORVASC) 10 MG tablet TAKE 1 TABLET BY MOUTH EVERY DAY 02/03/23   Nafziger, Tommi Rumps, NP  aspirin EC 81 MG tablet Take 81 mg by mouth daily. Swallow whole.    [provider]  atorvastatin (LIPITOR) 40 MG tablet TAKE 1 TABLET BY MOUTH EVERY DAY 02/03/23   Nafziger, Tommi Rumps, NP  blood glucose meter kit and supplies KIT Dispense based on patient and insurance preference. Use up to three times daily as directed. 03/19/21   Nafziger, Tommi Rumps, NP  Cholecalciferol (VITAMIN D-3) 125 MCG (5000 UT) TABS Take 1 tablet by mouth daily.    [provider]  dapagliflozin propanediol (FARXIGA) 5 MG TABS tablet Take 1 tablet (5 mg total) by mouth daily before breakfast. 12/02/22   Nafziger,  Tommi Rumps, NP  empagliflozin (JARDIANCE) 10 MG TABS tablet Take 1 tablet (10 mg total) by mouth daily before breakfast. 12/02/22   Nafziger, Tommi Rumps, NP  fexofenadine (ALLEGRA ALLERGY) 60 MG tablet Take 1 tablet (60 mg total) by mouth daily as needed (itching). Patient not taking: Reported on 12/02/2022 08/14/22   Billie Ruddy, MD  hydrALAZINE (APRESOLINE) 25 MG tablet Take 1 tablet (25 mg total) by mouth 3 (three) times daily. 12/02/22 03/02/23  Nafziger, Tommi Rumps, NP  hydrocortisone cream 1 % Apply 1 Application topically 2 (two) times daily as needed for itching. 08/14/22   Billie Ruddy, MD  isosorbide dinitrate (ISORDIL) 20 MG tablet TAKE 1 TABLET BY MOUTH THREE TIMES A DAY 10/07/22   Nafziger, Tommi Rumps, NP  labetalol (NORMODYNE) 200 MG tablet TAKE 2 TABLETS BY MOUTH 2 TIMES DAILY. 10/07/22   Nafziger, Tommi Rumps, NP  losartan (COZAAR) 25 MG tablet TAKE 1 TABLET (25 MG TOTAL) BY MOUTH DAILY. 02/03/23   Nafziger, Tommi Rumps, NP  pantoprazole (PROTONIX) 40 MG tablet TAKE 1 TABLET (40 MG TOTAL) BY MOUTH 2 (TWO) TIMES DAILY. OFFICE VISIT FOR FURTHER REFILLS 10/30/22   Irene Shipper, MD      Allergies    Strawberry extract    Review of Systems   Review of Systems  Neurological:  Positive for weakness.  Physical Exam Updated Vital Signs BP (!) 158/97   Pulse 73   Temp 97.9 F (36.6 C)   Resp 19   SpO2 98%  Physical Exam Vitals and nursing note reviewed.  Constitutional:      General: He is not in acute distress.    Appearance: Normal appearance.  HENT:     Head: Normocephalic and atraumatic.     Nose: Nose normal.     Mouth/Throat:     Mouth: Mucous membranes are moist.     Pharynx: Oropharynx is clear.  Eyes:     Extraocular Movements: Extraocular movements intact.     Conjunctiva/sclera: Conjunctivae normal.     Pupils: Pupils are equal, round, and reactive to light.  Cardiovascular:     Rate and Rhythm: Normal rate and regular rhythm.     Heart sounds: Normal heart sounds.  Pulmonary:      Effort: Pulmonary effort is normal.     Breath sounds: Normal breath sounds.  Abdominal:     General: Abdomen is flat.     Palpations: Abdomen is soft.     Tenderness: There is no abdominal tenderness.  Musculoskeletal:        General: Normal range of motion.     Cervical back: Normal range of motion and neck supple.     Right lower leg: No edema.     Left lower leg: No edema.  Skin:    General: Skin is warm and dry.  Neurological:     Mental Status: He is alert and oriented to person, place, and time.     Comments: No obvious facial droop Normal speech No drift in bilateral upper extremities No drift in right lower extremity Drift in left lower extremity with 4 out of 5 strength Sensation intact in all 4 extremities Normal finger-to-nose bilaterally  Psychiatric:        Mood and Affect: Mood normal.        Behavior: Behavior normal.     ED Results / Procedures / Treatments   Labs (all labs ordered are listed, but only abnormal results are displayed) Labs Reviewed  CBC - Abnormal; Notable for the following components:      Result Value   RBC 4.13 (*)    Hemoglobin 12.8 (*)    HCT 38.2 (*)    All other components within normal limits  COMPREHENSIVE METABOLIC PANEL - Abnormal; Notable for the following components:   Glucose, Bld 121 (*)    BUN 27 (*)    Creatinine, Ser 3.35 (*)    GFR, Estimated 19 (*)    All other components within normal limits  I-STAT CHEM 8, ED - Abnormal; Notable for the following components:   BUN 30 (*)    Creatinine, Ser 3.60 (*)    Glucose, Bld 118 (*)    Hemoglobin 12.9 (*)    HCT 38.0 (*)    All other components within normal limits  ETHANOL  PROTIME-INR  APTT  DIFFERENTIAL  RAPID URINE DRUG SCREEN, HOSP PERFORMED  URINALYSIS, ROUTINE W REFLEX MICROSCOPIC    EKG EKG Interpretation  Date/Time:  Wednesday February 11 2023 15:15:43 EST Ventricular Rate:  67 PR Interval:  161 QRS Duration: 117 QT Interval:  449 QTC  Calculation: 474 R Axis:   -43 Text Interpretation: Sinus rhythm Nonspecific IVCD with LAD Probable anteroseptal infarct, old Borderline T abnormalities, inferior leads Bigeminy resolved, otherwise no significant change from prior EKG Confirmed by Leanord Asal ([id number]) on 02/11/2023 3:36:31 PM  Radiology CT HEAD CODE STROKE WO CONTRAST  Result Date: 02/11/2023 CLINICAL DATA:  Code stroke.  Left leg weakness EXAM: CT HEAD WITHOUT CONTRAST TECHNIQUE: Contiguous axial images were obtained from the base of the skull through the vertex without intravenous contrast. RADIATION DOSE REDUCTION: This exam was performed according to the departmental dose-optimization program which includes automated exposure control, adjustment of the mA and/or kV according to patient size and/or use of iterative reconstruction technique. COMPARISON:  None Available. FINDINGS: Brain: No evidence of acute infarction, hemorrhage, hydrocephalus, extra-axial collection or mass lesion/mass effect. Vascular: No hyperdense vessel or unexpected calcification. Skull: Normal. Negative for fracture or focal lesion. Sinuses/Orbits: No mastoid or middle ear effusion. Paranasal sinuses are clear. Orbits are unremarkable. Other: None. ASPECTS [facility] Stroke Program Early CT Score): 10 IMPRESSION: No hemorrhage or CT evidence of an acute infarct. Findings were paged to Dr. Curly Shores on 02/11/23 at 3:05 PM via [facility] paging system. Electronically Signed   By: Marin Roberts M.D.   On: 02/11/2023 15:07    Procedures Procedures    Medications Ordered in ED Medications - No data to display  ED Course/ Medical Decision Making/ A&P Clinical Course as of 02/11/23 1615  Wed Feb 11, 2023  1553 Labs with mild Cr increase 3.6 from baseline ~2.8 otherwise within normal range. CTH without acute abnormality. He was evaluated by neuro who have low suspicion for CVA and more likely due to pain. Patient reports that his pain has resolved and he is able to  ambulate in the room normally. He is stable for discharge home with primary care follow up. [VK]    Clinical Course User Index [VK] Kemper Durie, DO                             Medical Decision Making This patient presents to the ED with chief complaint(s) of LLE weakness with pertinent past medical history of HTN, DM, CKD, prior CVA which further complicates the presenting complaint. The complaint involves an extensive differential diagnosis and also carries with it a high risk of complications and morbidity.    The differential diagnosis includes CVA, TIA, ICH/mass effect, peripheral neuropathy, fracture, dislocation, muscle strain, no significant swelling making DVT unlikely   Additional history obtained: Additional history obtained from N/A Records reviewed Primary Care Documents  ED Course and Reassessment: Was initially called by the provider in triage for evaluation in triage for patient's sudden onset left lower extremity weakness.  He does have drift in the left lower extremity.  No other obvious deficits.  He did have a sudden onset approximately 1.5 hours ago and was made a stroke alert.  Independent labs interpretation:  The following labs were independently interpreted: Mild increase of creatinine from baseline otherwise within normal range  Independent visualization of imaging: - I independently visualized the following imaging with scope of interpretation limited to determining acute life threatening conditions related to emergency care: CT head, which revealed no acute disease  Consultation: - Consulted or discussed management/test interpretation w/ external professional: Neurology  Consideration for admission or further workup: Patient has no emergent conditions requiring admission or further work-up at this time and is stable for discharge home with primary care follow-up  Social Determinants of health: N/A            Final Clinical Impression(s) /  ED Diagnoses Final diagnoses:  Left leg pain  AKI (acute kidney injury) ([place])    Rx /  DC Orders ED Discharge Orders     None         Kemper Durie, [place] 02/11/23 1615

## 2023-02-11 NOTE — Code Documentation (Addendum)
Jared [name] is a 77 yr old male arriving to [facility] via [place] on 02/11/2023 with a PMH of  CKD, HLD, HTN, OSA, DM2, previous stroke, Gout, and GERD. He is not on any blood thinners.  Pt is from home where he was LKW at 1230, and is now complaining of left leg weakness.     Code stroke activated in Triage. Labs, CBG obtained. Airway cleared. Pt to CT where he was met by Stroke Team. NIHSS 1, for left leg weakness. The following imaging was obtained: CT. CT is negative for acute hemorrhage per Dr. Curly Shores. Pt not a candidate for TNK at this time as symptoms mild. He is ineligible for thrombectomy as exam is LVO negative.    Pt back to room 2 where his workup will continue. He will need q 30 min VS and NIHSS until he is OOW for thrombolytic at 1700. Then he will need q 2 hr for 12 hrs, then q 4. Permissive HTN<220/110, NPO until stroke swallow screen obtained. Bedside handoff with Latoya RN complete.   [place]land [place] RN Stroke Response [phone]

## 2023-02-11 NOTE — Consult Note (Signed)
Neurology Consultation  Reason for Consult: Code Stroke Referring Physician: Camila Li  CC: Left leg pain and weakness  History is obtained from: Patient  HPI: Jared [name] is a 77 y.o. male with a past medical history of CKD, HLD, HTN, OSA, DM2, previous stroke, Gout, and GERD presenting with left leg pain and weakness. He states his leg gave out around 0800 for a few seconds as a quick jerk when he was at work but then mostly returned to normal. He then had trouble getting into the car and felt like his leg was numb and heavy.  On further clarification regarding numbness in the leg he states he never lost feeling in the leg he could always feel when he was touching it or other people were touching it but that the severe pain prevented him from moving it, and it felt heavy.  Outside of the pain limiting movement and he was not having any other weakness  He is currently taking ASA 73m.   LKW: 0800 tpa given?: No, outside window Premorbid modified Rankin scale (mRS): 3  ROS: Full ROS was performed and is negative except as noted in the HPI.   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   GERD (gastroesophageal reflux disease)    Gout 07/26/2007   "on daily RX"  (12/15/2018)   HYPERCHOLESTEROLEMIA, BORDERLINE 01/16/2010   HYPERTENSION 07/26/2007   OSA (obstructive sleep apnea)    "haven't used mask in quite awhile" (12/15/2018)   OSTEOARTHRITIS 09/28/2007   PLANTAR FASCIITIS, BILATERAL 04/19/2010   PROSTATITIS, RECURRENT 11/08/2009   RENAL INSUFFICIENCY 07/26/2007   Sleep apnea    off Cpap    Stroke ([place] 01/21/2013   "fully recovered" (12/15/2018)   Type II diabetes mellitus ([place]    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   atorvastatin (LIPITOR) 40 mg, Oral, Daily   blood glucose meter kit and supplies KIT Dispense based on patient and insurance preference. Use up to three times daily as directed.   Cholecalciferol (VITAMIN D-3) 125 MCG (5000 UT) TABS 1 tablet, Oral, Daily   dapagliflozin propanediol (FARXIGA) 5 mg, Oral, Daily before breakfast   empagliflozin (JARDIANCE) 10 mg, Oral, Daily before breakfast   fexofenadine (ALLEGRA ALLERGY) 60 mg, Oral, Daily PRN   hydrALAZINE (APRESOLINE) 25 mg, Oral, 3 times daily   hydrocortisone cream 1 % 1 Application, Topical, 2 times daily PRN   isosorbide dinitrate (ISORDIL) 20 mg, Oral, 3 times daily   labetalol (NORMODYNE) 400 mg, Oral, 2 times daily   losartan (COZAAR) 25 mg, Oral, Daily   pantoprazole (PROTONIX) 40 mg, Oral, 2 times daily, Office visit for further refills     Family History  Problem Relation Age of Onset   Heart disease Mother    Heart disease Father    CVA Father    Colon polyps  Father    Diabetes Sister    Colon cancer Neg Hx    Esophageal cancer Neg Hx    Stomach cancer Neg Hx    Pancreatic cancer Neg Hx    Rectal cancer Neg Hx    Social History:   reports that he has never smoked. He has never used smokeless tobacco. He reports that he does not currently use alcohol. He reports that he does not use drugs.  Medications No current facility-administered medications for this encounter.  Current Outpatient Medications:    allopurinol (ZYLOPRIM) 100 MG tablet, TAKE 1 TABLET BY MOUTH TWICE A DAY, Disp: 180 tablet, Rfl: 0   amLODipine (NORVASC) 10 MG tablet, TAKE 1 TABLET BY MOUTH EVERY DAY, Disp: 90 tablet,  Rfl: 0   aspirin EC 81 MG tablet, Take 81 mg by mouth daily. Swallow whole., Disp: , Rfl:    atorvastatin (LIPITOR) 40 MG tablet, TAKE 1 TABLET BY MOUTH EVERY DAY, Disp: 90 tablet, Rfl: 3   blood glucose meter kit and supplies KIT, Dispense based on patient and insurance preference. Use up to three times daily as directed., Disp: 1 each, Rfl: 0   Cholecalciferol (VITAMIN D-3) 125 MCG (5000 UT) TABS, Take 1 tablet by mouth daily., Disp: , Rfl:    dapagliflozin propanediol (FARXIGA) 5 MG TABS tablet, Take 1 tablet (5 mg total) by mouth daily before breakfast., Disp: 90 tablet, Rfl: 1   empagliflozin (JARDIANCE) 10 MG TABS tablet, Take 1 tablet (10 mg total) by mouth daily before breakfast., Disp: 90 tablet, Rfl: 0   fexofenadine (ALLEGRA ALLERGY) 60 MG tablet, Take 1 tablet (60 mg total) by mouth daily as needed (itching). (Patient not taking: Reported on 12/02/2022), Disp: 30 tablet, Rfl: 0   hydrALAZINE (APRESOLINE) 25 MG tablet, Take 1 tablet (25 mg total) by mouth 3 (three) times daily., Disp: 270 tablet, Rfl: 3   hydrocortisone cream 1 %, Apply 1 Application topically 2 (two) times daily as needed for itching., Disp: 14 g, Rfl: 0   isosorbide dinitrate (ISORDIL) 20 MG tablet, TAKE 1 TABLET BY MOUTH THREE TIMES A DAY, Disp: 270 tablet, Rfl: 3   labetalol (NORMODYNE) 200 MG tablet, TAKE 2 TABLETS BY MOUTH 2 TIMES DAILY., Disp: 360 tablet, Rfl: 2   losartan (COZAAR) 25 MG tablet, TAKE 1 TABLET (25 MG TOTAL) BY MOUTH DAILY., Disp: 90 tablet, Rfl: 0   pantoprazole (PROTONIX) 40 MG tablet, TAKE 1 TABLET (40 MG TOTAL) BY MOUTH 2 (TWO) TIMES DAILY. OFFICE VISIT FOR FURTHER REFILLS, Disp: 180 tablet, Rfl: 0  Exam: Current vital signs: BP (!) 165/86 (BP Location: Left Arm)   Pulse 76   Temp 97.6 F (36.4 C)   Resp 18   SpO2 99%  Vital signs in last 24 hours: Temp:  [97.6 F (36.4 C)] 97.6 F (36.4 C) (02/21 1444) Pulse Rate:  [76] 76 (02/21 1444) Resp:  [18] 18 (02/21 1444) BP: (165)/(86)  165/86 (02/21 1444) SpO2:  [99 %] 99 % (02/21 1444)  GENERAL: Awake, alert in NAD HEENT: - Normocephalic and atraumatic, dry mm, no LN++, no Thyromegally LUNGS - Clear to auscultation bilaterally with no wheezes CV - S1S2 RRR, no m/r/g, equal pulses bilaterally. ABDOMEN - Soft, nontender, nondistended with normoactive BS Ext: warm, well perfused, intact peripheral pulses, no edema  NEURO:  Mental Status: AA&Ox3  Language: speech is clear.  Naming, repetition, fluency, and comprehension intact. Cranial Nerves: PERRL mm/brisk. EOMI, visual fields full, no facial asymmetry, facial sensation intact, hearing intact,  tongue/uvula/soft palate midline, normal sternocleidomastoid and trapezius muscle strength. No evidence of tongue atrophy or fasciculations Motor: No pronator drift.  Slight drift in the left lower extremity on NIH testing initially but on later testing this resolved Tone: is normal and bulk is normal Sensation- Intact to light touch bilaterally Coordination: FTN intact bilaterally, no ataxia in BLE. Gait-ambulating at his baseline gait, slightly wide-based and cautious.  Able to rise on heels and toes.   NIHSS components Score: Comment  1a Level of Conscious 0[x]$  1[]$  2[]$  3[]$         1b LOC Questions 0[x]$  1[]$  2[]$           1c LOC Commands 0[x]$  1[]$  2[]$           2 Best Gaze 0[x]$  1[]$  2[]$           3 Visual 0[x]$  1[]$  2[]$  3[]$         4 Facial Palsy 0[x]$  1[]$  2[]$  3[]$         5a Motor Arm - left 0[x]$  1[]$  2[]$  3[]$  4[]$  UN[]$     5b Motor Arm - Right 0[x]$  1[]$  2[]$  3[]$  4[]$  UN[]$     6a Motor Leg - Left 0[]$  1[x]$  2[]$  3[]$  4[]$  UN[]$     6b Motor Leg - Right 0[x]$  1[]$  2[]$  3[]$  4[]$  UN[]$     7 Limb Ataxia 0[x]$  1[]$  2[]$  3[]$  UN[]$       8 Sensory 0[x]$  1[]$  2[]$  UN[]$         9 Best Language 0[x]$  1[]$  2[]$  3[]$         10 Dysarthria 0[x]$  1[]$  2[]$  UN[]$         11 Extinct. and Inattention 0[x]$  1[]$  2[]$           TOTAL: 1        Labs I have reviewed labs in epic and the results pertinent to this consultation are:  CBC     Component Value Date/Time   WBC 6.3 02/11/2023 1445   RBC 4.13 (L) 02/11/2023 1445   HGB 12.9 (L) 02/11/2023 1450   HCT 38.0 (L) 02/11/2023 1450   PLT 192 02/11/2023 1445   MCV 92.5 02/11/2023 1445   MCV 90.5 09/28/2013 1919   MCH 31.0 02/11/2023 1445   MCHC 33.5 02/11/2023 1445   RDW 14.3 02/11/2023 1445   LYMPHSABS 1.6 02/11/2023 1445   MONOABS 0.7 02/11/2023 1445   EOSABS 0.4 02/11/2023 1445   BASOSABS 0.1 02/11/2023 1445    CMP     Component Value Date/Time   NA 143 02/11/2023 1450   NA 140 10/05/2018 0000   NA 138 10/05/2018 0000   K 4.3 02/11/2023 1450   CL 107 02/11/2023 1450   CO2 26 12/02/2022 0850   GLUCOSE 118 (H) 02/11/2023 1450   GLUCOSE 80 11/02/2006 1131   BUN 30 (H) 02/11/2023 1450   BUN 20 10/05/2018 0000   BUN 21 10/05/2018 0000   CREATININE 3.60 (H) 02/11/2023 1450   CREATININE 3.00 (H) 08/23/2020 1421   CALCIUM 9.3 12/02/2022 0850   PROT 7.2 12/02/2022 0850   ALBUMIN 4.4 12/02/2022 0850   AST 21 12/02/2022 0850   ALT 22 12/02/2022 0850   ALKPHOS 94 12/02/2022 0850   BILITOT 0.5 12/02/2022 0850   GFRNONAA 20 (L) 08/23/2020 1421   GFRAA 23 (L) 08/23/2020 1421    Lipid Panel     Component Value Date/Time   CHOL 120 12/02/2022 0850   CHOL 121 10/17/2013 1013   TRIG 128.0 12/02/2022 0850   TRIG 128 10/17/2013 1013  HDL 34.70 (L) 12/02/2022 0850   HDL 34 (L) 10/17/2013 1013   CHOLHDL 3 12/02/2022 0850   VLDL 25.6 12/02/2022 0850   LDLCALC 60 12/02/2022 0850   LDLCALC 91 08/23/2020 1421   LDLCALC 61 10/17/2013 1013   LDLDIRECT 85.1 06/03/2013 1212     Imaging I have reviewed the images obtained:  CT-head personally reviewed, agree with radiology no acute intracranial process   Assessment:  77 y.o. male presenting with gait difficulty in the setting of acute left leg radicular pain from the knee down to the foot, intermittently reproducible with movement of the left knee during examination.  No sensory deficit.  By history and  examination this appears to be musculoskeletal issue and I have very low concern that this localizes to the central nervous system.  Impression: -Left knee pain  Recommendations: -Lumbar spine MRI  -Other workup of knee pain per ED provider  Patient seen and examined by NP/APP with MD. MD to update note as needed.   Janine Ores, DNP, FNP-BC [facility] Pager: [phone]   Attending Neurologist's note:  I personally saw this patient, gathering history, performing a full neurologic examination, reviewing relevant labs, personally reviewing relevant imaging including head CT, and formulated the assessment and plan, adding the note above for completeness and clarity to accurately reflect my thoughts  CRITICAL CARE Performed by: Lorenza Chick   Total critical care time: 45 minutes  Critical care time was exclusive of separately billable procedures and treating other patients.  Critical care was necessary to treat or prevent imminent or life-threatening deterioration, emergent evaluation for consideration of thrombectomy or thrombolytic.  Critical care was time spent personally by me on the following activities: development of treatment plan with patient and/or surrogate as well as nursing, discussions with consultants, evaluation of patient's response to treatment, examination of patient, obtaining history from patient or surrogate, ordering and performing treatments and interventions, ordering and review of laboratory studies, ordering and review of radiographic studies, pulse oximetry and re-evaluation of patient's condition.

## 2023-02-13 ENCOUNTER — Encounter: Payer: Self-pay | Admitting: Family Medicine

## 2023-02-13 ENCOUNTER — Ambulatory Visit (INDEPENDENT_AMBULATORY_CARE_PROVIDER_SITE_OTHER): Payer: Medicare HMO | Admitting: Family Medicine

## 2023-02-13 VITALS — BP 138/90 | HR 85 | Resp 16 | Ht 67.25 in | Wt 228.1 lb

## 2023-02-13 DIAGNOSIS — E1122 Type 2 diabetes mellitus with diabetic chronic kidney disease: Secondary | ICD-10-CM

## 2023-02-13 DIAGNOSIS — M79605 Pain in left leg: Secondary | ICD-10-CM

## 2023-02-13 DIAGNOSIS — I10 Essential (primary) hypertension: Secondary | ICD-10-CM

## 2023-02-13 DIAGNOSIS — N184 Chronic kidney disease, stage 4 (severe): Secondary | ICD-10-CM | POA: Diagnosis not present

## 2023-02-13 DIAGNOSIS — N179 Acute kidney failure, unspecified: Secondary | ICD-10-CM

## 2023-02-13 LAB — BASIC METABOLIC PANEL
BUN: 27 mg/dL — ABNORMAL HIGH (ref 6–23)
CO2: 24 mEq/L (ref 19–32)
Calcium: 9.7 mg/dL (ref 8.4–10.5)
Chloride: 106 mEq/L (ref 96–112)
Creatinine, Ser: 3.09 mg/dL — ABNORMAL HIGH (ref 0.40–1.50)
GFR: 19.17 mL/min — ABNORMAL LOW (ref >60.00)
Glucose, Bld: 110 mg/dL — ABNORMAL HIGH (ref 70–99)
Potassium: 4.2 mEq/L (ref 3.5–5.1)
Sodium: 140 mEq/L (ref 135–145)

## 2023-02-13 MED ORDER — DICLOFENAC SODIUM 1 % EX GEL: 2.0000 g | Freq: Four times a day (QID) | CUTANEOUS | 1 refills | Status: DC

## 2023-02-13 MED ORDER — HYDRALAZINE HCL 25 MG PO TABS: 25.0000 mg | ORAL_TABLET | Freq: Three times a day (TID) | ORAL | 0 refills | Status: DC

## 2023-02-13 NOTE — Patient Instructions (Addendum)
A few things to remember from today's visit:  Pain of left lower extremity  AKI (acute kidney injury) ([place]) - Plan: Basic metabolic panel  CKD (chronic kidney disease) stage 4, GFR 15-29 ml/min ([facility]) - Plan: hydrALAZINE (APRESOLINE) 25 MG tablet, Basic metabolic panel  Essential hypertension - Plan: hydrALAZINE (APRESOLINE) 25 MG tablet  Leg pain could be caused by knee problems. Question of baker cyst, which can be related to osteoarthritis. Pain has improved. You can take Tylenol 500 mg 3-4 times per day as needed. Voltaren gel 3-4 times per day. Fall precautions.  STOP Losartan. Start taking Hydralazine 25 mg 3 times per day. No changes in rest.  Monitor blood pressure. Keep appt with kidney specialist.  If left becomes swollen, more than your normal or red you need to seek immediate medical attention.  Do not use My Chart to request refills or for acute issues that need immediate attention. If you send a my chart message, it may take a few days to be addressed, specially if I am not in the office.  Please be sure medication list is accurate. If a new problem present, please set up appointment sooner than planned today.

## 2023-02-13 NOTE — Progress Notes (Addendum)
Chief Complaint  Patient presents with   Follow-up    ED follow up   HPI: [name] is a 77 y.o. male with past medical history significant for hypertension, DM 2, CKD 4,OSA,CVA here today to follow on recent ED visit. PCP is not available today.  He was evaluated in the ED on 02/11/2023 because of left lower extremity pain and weakness.   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 He experienced severe pain that originated in the back of his calf and knee when attempting to lift an object.  The pain persisted, and he noted numbness and a heavy feeling in the same leg, although he did not experience any weakness of the leg or difficulty walking.  Because of pain he had difficulty trying to get out out of the car.  He is reporting improvement of pain and heavy feeling as well as numbness have resolved. Pain is intermittent daily, sharp.  Pain is mainly in lateral distal aspect of left thigh and lateral/posterior knee. Pain is exacerbated by walking and with bending left knee. He has not noted left lower extremity edema or erythema. No associated lower back pain. No history of trauma. He rates the current pain as 5 out of 10.  Head CT negative for hemorrhage or evidence of acute infarct. CKD IV:Cr baseline around  2.4-2.8 and e GFR 22-25. Last visit with his nephrologist on 10/06/2022.  He has been maintaining his usual fluid intake and has not been consuming excessive salt or taking NSAIDs.  His BP at home was slightly elevated at 130's/90's He denies any associated symptoms such as headache, chest pain, dyspnea, palpitations,abdominal pain, nausea, or urinary symptoms. He has not noted gross hematuria, foamy urine, or decreased urine output.  He would like to go through all his medications and be sure that his taking what he supposed to. Hydralazine 25 mg is in his medication list, he has not been taking it for a couple months. He is  currently on losartan 25 mg daily, Isordil 20 mg 3 times daily, amlodipine 10 mg daily, labetalol 400 mg twice daily. Hx of CVA with no significant residual deficit.  Lab Results  Component Value Date   CREATININE 3.60 (H) 02/11/2023   BUN 30 (H) 02/11/2023   NA 143 02/11/2023   K 4.3 02/11/2023   CL 107 02/11/2023   CO2 23 02/11/2023   EKG reported with mild abnormalities, no significant changes from prior EKG. Lab Results  Component Value Date   WBC 6.3 02/11/2023   HGB 12.9 (L) 02/11/2023   HCT 38.0 (L) 02/11/2023   MCV 92.5 02/11/2023   PLT 192 02/11/2023   Lab Results  Component Value Date   ALT 24 02/11/2023   AST 34 02/11/2023   ALKPHOS 86 02/11/2023   BILITOT 0.7 02/11/2023   DM II, he is not monitoring BS regularly. He is not taking [place] due to cost. Lab Results  Component Value Date   HGBA1C 6.6 (H) 12/02/2022   He takes allopurinol 100 mg daily for gout. Hyperlipidemia on atorvastatin 40 mg daily. GERD on pantoprazole 40 mg daily.  Review of Systems  Constitutional:  Negative for appetite change, chills and fever.  HENT:  Negative for mouth sores, nosebleeds and sore throat.   Eyes:  Negative for pain and visual disturbance.  Respiratory:  Negative for cough and wheezing.   Gastrointestinal:  Negative for abdominal pain and vomiting.  Endocrine: Negative for cold intolerance,  heat intolerance, polydipsia, polyphagia and polyuria.  Skin:  Negative for rash.  Neurological:  Negative for syncope and facial asymmetry.  Psychiatric/Behavioral:  Negative for confusion and hallucinations.   See other pertinent positives and negatives in HPI.  Current Outpatient Medications on File Prior to Visit  Medication Sig Dispense Refill   allopurinol (ZYLOPRIM) 100 MG tablet TAKE 1 TABLET BY MOUTH TWICE A DAY 180 tablet 0   amLODipine (NORVASC) 10 MG tablet TAKE 1 TABLET BY MOUTH EVERY DAY 90 tablet 0   aspirin EC 81 MG tablet Take 81 mg by mouth daily. Swallow  whole.     atorvastatin (LIPITOR) 40 MG tablet TAKE 1 TABLET BY MOUTH EVERY DAY 90 tablet 3   blood glucose meter kit and supplies KIT Dispense based on patient and insurance preference. Use up to three times daily as directed. 1 each 0   Cholecalciferol (VITAMIN D-3) 125 MCG (5000 UT) TABS Take 1 tablet by mouth daily.     dapagliflozin propanediol (FARXIGA) 5 MG TABS tablet Take 1 tablet (5 mg total) by mouth daily before breakfast. 90 tablet 1   fexofenadine (ALLEGRA ALLERGY) 60 MG tablet Take 1 tablet (60 mg total) by mouth daily as needed (itching). 30 tablet 0   hydrocortisone cream 1 % Apply 1 Application topically 2 (two) times daily as needed for itching. 14 g 0   isosorbide dinitrate (ISORDIL) 20 MG tablet TAKE 1 TABLET BY MOUTH THREE TIMES A DAY 270 tablet 3   labetalol (NORMODYNE) 200 MG tablet TAKE 2 TABLETS BY MOUTH 2 TIMES DAILY. 360 tablet 2   losartan (COZAAR) 25 MG tablet TAKE 1 TABLET (25 MG TOTAL) BY MOUTH DAILY. 90 tablet 0   pantoprazole (PROTONIX) 40 MG tablet TAKE 1 TABLET (40 MG TOTAL) BY MOUTH 2 (TWO) TIMES DAILY. OFFICE VISIT FOR FURTHER REFILLS 180 tablet 0   No current facility-administered medications on file prior to visit.    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   GERD (gastroesophageal reflux disease)    Gout 07/26/2007   "on daily RX"  (12/15/2018)   HYPERCHOLESTEROLEMIA, BORDERLINE 01/16/2010   HYPERTENSION 07/26/2007   OSA (obstructive sleep apnea)    "haven't used mask in quite awhile" (12/15/2018)   OSTEOARTHRITIS 09/28/2007   PLANTAR FASCIITIS, BILATERAL 04/19/2010   PROSTATITIS, RECURRENT 11/08/2009   RENAL INSUFFICIENCY 07/26/2007   Sleep apnea    off Cpap    Stroke ([place]) 01/21/2013   "fully recovered" (12/15/2018)   Type II diabetes mellitus ([place])    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   URINARY FREQUENCY, CHRONIC 03/10/2008   Allergies  Allergen Reactions    Strawberry Extract Shortness Of Breath    Social History   Socioeconomic History   Marital status: Married    Spouse name: Tigard   Number of children: 5   Years of education: 12   Highest education level: Not on file  Occupational History   Occupation: TESTOR    Employer: [employer]    Comment: retired  Tobacco Use   Smoking status: Never   Smokeless tobacco: Never  Vaping Use   Vaping Use: Never used  Substance and Sexual Activity   Alcohol use: Not Currently   Drug use: Never   Sexual activity: Not Currently  Other Topics Concern   Not on file  Social History Narrative   He has a part time job for delivering car parts   Married  4 children - All in [place]      He likes to go to car shows and shoot pool.    Social Determinants of Health   Financial Resource Strain: Low Risk  (05/06/2022)   Overall Financial Resource Strain (CARDIA)    Difficulty of Paying Living Expenses: Not hard at all  Food Insecurity: No Food Insecurity (05/06/2022)   Hunger Vital Sign    Worried About Running Out of Food in the Last Year: Never true    Ran Out of Food in the Last Year: Never true  Transportation Needs: No Transportation Needs (05/06/2022)   PRAPARE - Hydrologist (Medical): No    Lack of Transportation (Non-Medical): No  Physical Activity: Inactive (05/06/2022)   Exercise Vital Sign    Days of Exercise per Week: 0 days    Minutes of Exercise per Session: 0 min  Stress: No Stress Concern Present (05/06/2022)   [place]    Feeling of Stress : Only a little  Social Connections: Not on file   Vitals:   02/13/23 1154  BP: (!) 138/90  Pulse: 85  Resp: 16  SpO2: 99%   Body mass index is 35.46 kg/m.  Physical Exam Nursing note reviewed.  Constitutional:      General: He is not in acute distress.    Appearance: He is well-developed.  HENT:     Head: Normocephalic and atraumatic.   Eyes:     Conjunctiva/sclera: Conjunctivae normal.  Cardiovascular:     Rate and Rhythm: Normal rate and regular rhythm. Occasional Extrasystoles are present.    Pulses:          Dorsalis pedis pulses are 2+ on the right side and 2+ on the left side.     Heart sounds: No murmur heard.    Comments: No calf pain elicited with left foot dorsiflexion or palpation. Pulmonary:     Effort: Pulmonary effort is normal. No respiratory distress.     Breath sounds: Normal breath sounds.  Abdominal:     Palpations: Abdomen is soft. There is no mass.     Tenderness: There is no abdominal tenderness.  Musculoskeletal:     Left knee: Crepitus present. Decreased range of motion. Tenderness (with movement) present.       Legs:  Skin:    General: Skin is warm.     Findings: No erythema or rash.  Neurological:     Mental Status: He is alert and oriented to person, place, and time.     Cranial Nerves: No cranial nerve deficit.     Gait: Gait normal.     Comments: No focal deficit appreciated. Patellar DTRs symmetric, 1+-2+. Antalgic gait, not assisted.  Psychiatric:        Mood and Affect: Mood and affect normal.   ASSESSMENT AND PLAN:  Mr. Mak was seen today for follow-up.  Diagnoses and all orders for this visit: Lab Results  Component Value Date   CREATININE 3.09 (H) 02/13/2023   BUN 27 (H) 02/13/2023   NA 140 02/13/2023   K 4.2 02/13/2023   CL 106 02/13/2023   CO2 24 02/13/2023   Pain of left lower extremity He is reporting great improvement. We discussed possible etiologies. History and examination today do not suggest a serious process, I do not think further workup is needed at this time. Pain is more localized around knee,?  Baker's cyst.  Knee imaging can be considered  during follow-up if pain is persistent. Tylenol 500 mg 3-4 times per day and Voltaren gel around left knee 3-4 times per day. He was clearly instructed about warning signs.  CKD (chronic kidney disease)  stage 4, GFR 15-29 ml/min (HCC) Creatinine 3.5-3.6 yesterday, his baseline is from 2.4-2.8; e GFR 19, baseline 20-25. Recommend stopping losartan. Continue adequate hydration, low-salt diet, and avoidance of NSAIDs. Following with nephrologist, he has an appointment next week. Adequate BP and glucose control are important. Continue Isordil, start hydralazine.  -     hydrALAZINE (APRESOLINE) 25 MG tablet; Take 1 tablet (25 mg total) by mouth 3 (three) times daily. -     Basic metabolic panel; Future  Essential hypertension Reporting elevated BPs at home, today BP mildly elevated. Losartan discontinued. Start Hydralazine 25 mg 3 times daily, new prescription sent. Continue amlodipine 10 mg daily, Isordil 20 mg 3 times daily, labetalol 400 mg twice daily. Instructed to monitor BP daily. He is seen his nephrologist next week. Follow-up with PCP in 4 weeks.  -     hydrALAZINE (APRESOLINE) 25 MG tablet; Take 1 tablet (25 mg total) by mouth 3 (three) times daily.  Type 2 diabetes mellitus with stage 4 chronic kidney disease, without long-term current use of insulin (HCC)  Hemoglobin A1c is at goal. Recommend monitoring BS regularly. He is not taking [place] due to cost, he could not afford Jardiance either. Follow-up with PCP in 4 weeks.  I spent a total of 48 minutes in both face to face and non face to face activities for this visit on the date of this encounter. During this time history was obtained and documented, examination was performed, prior labs/imaging reviewed, and assessment/plan discussed.  Reviewed medication list and indications with patient.  Return in about 4 weeks (around 03/13/2023) for Leg pain and HTN with PCP.  Kymoni Monday G. [place], MD  [facility]. [place] office.

## 2023-02-17 ENCOUNTER — Ambulatory Visit: Payer: Medicare HMO | Admitting: Adult Health

## 2023-03-03 ENCOUNTER — Ambulatory Visit (INDEPENDENT_AMBULATORY_CARE_PROVIDER_SITE_OTHER): Payer: Medicare HMO | Admitting: Adult Health

## 2023-03-03 ENCOUNTER — Encounter: Payer: Self-pay | Admitting: Adult Health

## 2023-03-03 VITALS — BP 160/100 | HR 70 | Temp 98.1°F | Ht 67.25 in | Wt 232.0 lb

## 2023-03-03 DIAGNOSIS — I10 Essential (primary) hypertension: Secondary | ICD-10-CM | POA: Diagnosis not present

## 2023-03-03 DIAGNOSIS — N522 Drug-induced erectile dysfunction: Secondary | ICD-10-CM | POA: Diagnosis not present

## 2023-03-03 DIAGNOSIS — F4321 Adjustment disorder with depressed mood: Secondary | ICD-10-CM

## 2023-03-03 DIAGNOSIS — N184 Chronic kidney disease, stage 4 (severe): Secondary | ICD-10-CM | POA: Diagnosis not present

## 2023-03-03 DIAGNOSIS — E1122 Type 2 diabetes mellitus with diabetic chronic kidney disease: Secondary | ICD-10-CM | POA: Diagnosis not present

## 2023-03-03 LAB — POCT GLYCOSYLATED HEMOGLOBIN (HGB A1C): Hemoglobin A1C: 6.1 % — AB (ref 4.0–5.6)

## 2023-03-03 MED ORDER — TADALAFIL 10 MG PO TABS: 10.0000 mg | ORAL_TABLET | ORAL | 1 refills | Status: DC | PRN

## 2023-03-03 MED ORDER — BUPROPION HCL ER (XL) 150 MG PO TB24: 150.0000 mg | ORAL_TABLET | Freq: Every day | ORAL | 0 refills | Status: DC

## 2023-03-03 NOTE — Patient Instructions (Addendum)
It was great seeing you today   Your A1c was 6.1   I am going to start you on Wellbutrin 150 mg daily to see if this helps with the depression   Follow up in 1 month

## 2023-03-03 NOTE — Progress Notes (Signed)
Subjective:    Patient ID: Jared [name], male    DOB: 11/19/1946, 77 y.o.   MRN: [id number]  HPI 77 year old male who  has a past medical history of Childhood asthma, CHRONIC GOUTY ARTHROPATHY WITH TOPHUS (11/08/2009), GERD (gastroesophageal reflux disease), Gout (07/26/2007), HYPERCHOLESTEROLEMIA, BORDERLINE (01/16/2010), HYPERTENSION (07/26/2007), OSA (obstructive sleep apnea), OSTEOARTHRITIS (09/28/2007), PLANTAR FASCIITIS, BILATERAL (04/19/2010), PROSTATITIS, RECURRENT (11/08/2009), RENAL INSUFFICIENCY (07/26/2007), Sleep apnea, Stroke ([place]) (01/21/2013), Type II diabetes mellitus ([place]), Unspecified disorder of lipoid metabolism (11/08/2009), and URINARY FREQUENCY, CHRONIC (03/10/2008).  He presents to the office today for follow up regarding DM and HTN   DM Type 2 -currently managed with Farxiga 5 mg daily. In the past he was on metformin - this was d/c due to CKD stage 4.  He does not monitor his blood sugars at home.  Denies episodes of hypoglycemia  Lab Results  Component Value Date   HGBA1C 6.6 (H) 12/02/2022   HTN -managed with lisinopril 10 mg twice daily, labetalol 200 mg twice daily, isosorbide 20 mg 3 times daily, hydralazine 25 mg 3 times daily, and Norvasc 10 mg daily.  He denies dizziness, lightheadedness, chest pain, or shortness of breath. He did take his blood pressure medication just prior to coming in this morning  BP Readings from Last 3 Encounters:  03/03/23 (!) 160/100  02/13/23 (!) 138/90  02/11/23 (!) 158/97   Depression - reports that he has been feeling more depressed lately. This stems from relationship issues with his wife. It sounds like she is threatening to move out on him.   ED - has been prescribed Cialis in the past but never took it. He would like to try it    Review of Systems See HPI   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   GERD (gastroesophageal reflux disease)    Gout  07/26/2007   "on daily RX"  (12/15/2018)   HYPERCHOLESTEROLEMIA, BORDERLINE 01/16/2010   HYPERTENSION 07/26/2007   OSA (obstructive sleep apnea)    "haven't used mask in quite awhile" (12/15/2018)   OSTEOARTHRITIS 09/28/2007   PLANTAR FASCIITIS, BILATERAL 04/19/2010   PROSTATITIS, RECURRENT 11/08/2009   RENAL INSUFFICIENCY 07/26/2007   Sleep apnea    off Cpap    Stroke ([place]) 01/21/2013   "fully recovered" (12/15/2018)   Type II diabetes mellitus ([place])    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   URINARY FREQUENCY, CHRONIC 03/10/2008    Social History   Socioeconomic History   Marital status: Married    Spouse name: [place]   Number of children: 5   Years of education: 12   Highest education level: Not on file  Occupational History   Occupation: TESTOR    Employer: [employer]    Comment: retired  Tobacco Use   Smoking status: Never   Smokeless tobacco: Never  Vaping Use   Vaping Use: Never used  Substance and Sexual Activity   Alcohol use: Not Currently   Drug use: Never   Sexual activity: Not Currently  Other Topics Concern   Not on file  Social History Narrative   He has a part time job for delivering car parts   Married    4 children - All in [place]      He likes to go to car shows and shoot pool.    Social Determinants of Health   Financial Resource Strain: Low Risk  (05/06/2022)   Overall Financial Resource  Strain (CARDIA)    Difficulty of Paying Living Expenses: Not hard at all  Food Insecurity: No Food Insecurity (05/06/2022)   Hunger Vital Sign    Worried About Running Out of Food in the Last Year: Never true    Ran Out of Food in the Last Year: Never true  Transportation Needs: No Transportation Needs (05/06/2022)   PRAPARE - Hydrologist (Medical): No    Lack of Transportation (Non-Medical): No  Physical Activity: Inactive (05/06/2022)   Exercise Vital Sign    Days of Exercise per Week: 0 days    Minutes of Exercise per  Session: 0 min  Stress: No Stress Concern Present (05/06/2022)   [place]    Feeling of Stress : Only a little  Social Connections: Not on file  Intimate Partner Violence: Not on file    Past Surgical History:  Procedure Laterality Date   COLONOSCOPY     CYST EXCISION Left    "leg"   TONSILLECTOMY     UPPER GASTROINTESTINAL ENDOSCOPY     WRIST SURGERY Right    "cut bone out; for gout""    Family History  Problem Relation Age of Onset   Heart disease Mother    Heart disease Father    CVA Father    Colon polyps Father    Diabetes Sister    Colon cancer Neg Hx    Esophageal cancer Neg Hx    Stomach cancer Neg Hx    Pancreatic cancer Neg Hx    Rectal cancer Neg Hx     Allergies  Allergen Reactions   Strawberry Extract Shortness Of Breath    Current Outpatient Medications on File Prior to Visit  Medication Sig Dispense Refill   allopurinol (ZYLOPRIM) 100 MG tablet TAKE 1 TABLET BY MOUTH TWICE A DAY 180 tablet 0   amLODipine (NORVASC) 10 MG tablet TAKE 1 TABLET BY MOUTH EVERY DAY 90 tablet 0   aspirin EC 81 MG tablet Take 81 mg by mouth daily. Swallow whole.     atorvastatin (LIPITOR) 40 MG tablet TAKE 1 TABLET BY MOUTH EVERY DAY 90 tablet 3   blood glucose meter kit and supplies KIT Dispense based on patient and insurance preference. Use up to three times daily as directed. 1 each 0   Cholecalciferol (VITAMIN D-3) 125 MCG (5000 UT) TABS Take 1 tablet by mouth daily.     dapagliflozin propanediol (FARXIGA) 5 MG TABS tablet Take 1 tablet (5 mg total) by mouth daily before breakfast. 90 tablet 1   diclofenac Sodium (VOLTAREN) 1 % GEL Apply 2 g topically 4 (four) times daily. 150 g 1   fexofenadine (ALLEGRA ALLERGY) 60 MG tablet Take 1 tablet (60 mg total) by mouth daily as needed (itching). 30 tablet 0   hydrALAZINE (APRESOLINE) 25 MG tablet Take 1 tablet (25 mg total) by mouth 3 (three) times daily. 270  tablet 0   hydrocortisone cream 1 % Apply 1 Application topically 2 (two) times daily as needed for itching. 14 g 0   isosorbide dinitrate (ISORDIL) 20 MG tablet TAKE 1 TABLET BY MOUTH THREE TIMES A DAY 270 tablet 3   labetalol (NORMODYNE) 200 MG tablet TAKE 2 TABLETS BY MOUTH 2 TIMES DAILY. 360 tablet 2   losartan (COZAAR) 25 MG tablet TAKE 1 TABLET (25 MG TOTAL) BY MOUTH DAILY. 90 tablet 0   pantoprazole (PROTONIX) 40 MG tablet TAKE 1 TABLET (40 MG TOTAL)  BY MOUTH 2 (TWO) TIMES DAILY. OFFICE VISIT FOR FURTHER REFILLS 180 tablet 0   No current facility-administered medications on file prior to visit.    BP (!) 160/100   Pulse 70   Temp 98.1 F (36.7 C) (Oral)   Ht 5' 7.25" (1.708 m)   Wt 232 lb (105.2 kg)   SpO2 98%   BMI 36.07 kg/m       Objective:   Physical Exam Vitals and nursing note reviewed.  Constitutional:      Appearance: Normal appearance. He is obese.  Cardiovascular:     Rate and Rhythm: Normal rate and regular rhythm.     Pulses: Normal pulses.     Heart sounds: Normal heart sounds.  Pulmonary:     Effort: Pulmonary effort is normal.     Breath sounds: Normal breath sounds.  Skin:    General: Skin is warm and dry.  Neurological:     General: No focal deficit present.     Mental Status: He is alert and oriented to person, place, and time.  Psychiatric:        Mood and Affect: Mood normal.        Behavior: Behavior normal.        Thought Content: Thought content normal.        Judgment: Judgment normal.       Assessment & Plan:   1. Essential hypertension - Not at goal today due to taking his medication just prior to arrival  - Continue to monitor   2. Type 2 diabetes mellitus with stage 4 chronic kidney disease, without long-term current use of insulin (HCC)  - POC HgB A1c- 6.1 - has improved  - Continue with Farxiga 5 mg daily  - Follow up in 6 months   3. Situational depression Flowsheet Row Office Visit from 03/03/2023 in [place] at [facility] Total Score 13      - Will start on Wellbutrin. Follow up in 1 month  - buPROPion (WELLBUTRIN XL) 150 MG 24 hr tablet; Take 1 tablet (150 mg total) by mouth daily.  Dispense: 90 tablet; Refill: 0  4. Drug-induced erectile dysfunction  - tadalafil (CIALIS) 10 MG tablet; Take 1 tablet (10 mg total) by mouth every other day as needed for erectile dysfunction.  Dispense: 10 tablet; Refill: 1  Dorothyann Peng, NP

## 2023-03-19 ENCOUNTER — Other Ambulatory Visit: Payer: Self-pay | Admitting: Internal Medicine

## 2023-03-19 DIAGNOSIS — K2990 Gastroduodenitis, unspecified, without bleeding: Secondary | ICD-10-CM

## 2023-03-19 DIAGNOSIS — K2101 Gastro-esophageal reflux disease with esophagitis, with bleeding: Secondary | ICD-10-CM

## 2023-03-19 DIAGNOSIS — K219 Gastro-esophageal reflux disease without esophagitis: Secondary | ICD-10-CM

## 2023-03-19 DIAGNOSIS — K222 Esophageal obstruction: Secondary | ICD-10-CM

## 2023-03-23 ENCOUNTER — Other Ambulatory Visit: Payer: Self-pay | Admitting: Internal Medicine

## 2023-03-23 DIAGNOSIS — K2101 Gastro-esophageal reflux disease with esophagitis, with bleeding: Secondary | ICD-10-CM

## 2023-03-23 DIAGNOSIS — K222 Esophageal obstruction: Secondary | ICD-10-CM

## 2023-03-23 DIAGNOSIS — K2970 Gastritis, unspecified, without bleeding: Secondary | ICD-10-CM

## 2023-03-23 DIAGNOSIS — K219 Gastro-esophageal reflux disease without esophagitis: Secondary | ICD-10-CM

## 2023-03-24 ENCOUNTER — Other Ambulatory Visit: Payer: Self-pay | Admitting: Adult Health

## 2023-03-24 ENCOUNTER — Telehealth: Payer: Self-pay | Admitting: Internal Medicine

## 2023-03-24 DIAGNOSIS — K2101 Gastro-esophageal reflux disease with esophagitis, with bleeding: Secondary | ICD-10-CM

## 2023-03-24 DIAGNOSIS — F4321 Adjustment disorder with depressed mood: Secondary | ICD-10-CM

## 2023-03-24 DIAGNOSIS — K219 Gastro-esophageal reflux disease without esophagitis: Secondary | ICD-10-CM

## 2023-03-24 DIAGNOSIS — K222 Esophageal obstruction: Secondary | ICD-10-CM

## 2023-03-24 DIAGNOSIS — K2970 Gastritis, unspecified, without bleeding: Secondary | ICD-10-CM

## 2023-03-24 MED ORDER — PANTOPRAZOLE SODIUM 40 MG PO TBEC: 40.0000 mg | DELAYED_RELEASE_TABLET | Freq: Two times a day (BID) | ORAL | 0 refills | Status: DC

## 2023-03-24 NOTE — Telephone Encounter (Signed)
Inbound call from patient requesting a refill for pantoprazole he is schedule to see Nevin Bloodgood on 5/15.Please advise

## 2023-03-24 NOTE — Telephone Encounter (Signed)
Patient called to follow up on previous message  

## 2023-03-24 NOTE — Telephone Encounter (Signed)
Pantoprazole refilled. 

## 2023-04-02 ENCOUNTER — Ambulatory Visit (INDEPENDENT_AMBULATORY_CARE_PROVIDER_SITE_OTHER): Payer: Medicare HMO | Admitting: Adult Health

## 2023-04-02 ENCOUNTER — Encounter: Payer: Self-pay | Admitting: Adult Health

## 2023-04-02 VITALS — BP 140/80 | HR 67 | Temp 97.6°F | Ht 67.25 in | Wt 234.0 lb

## 2023-04-02 DIAGNOSIS — F4321 Adjustment disorder with depressed mood: Secondary | ICD-10-CM | POA: Diagnosis not present

## 2023-04-02 DIAGNOSIS — I10 Essential (primary) hypertension: Secondary | ICD-10-CM | POA: Diagnosis not present

## 2023-04-02 DIAGNOSIS — N184 Chronic kidney disease, stage 4 (severe): Secondary | ICD-10-CM | POA: Diagnosis not present

## 2023-04-02 NOTE — Progress Notes (Signed)
Subjective:    Patient ID: Hessie DienerGene E Foot, male    DOB: 04-10-1946, 77 y.o.   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  He presents to the office today for one month follow up regarding Depression. When he was last seen a month ago he reported that he was feeling more depressed due to issues with his spouse. Today he reports that he stopped taking the medication after two days due to " making me feel weird".   He reports that things have been improving with his wife and his depression has improved in its own.   HTN with CKD stage 4 CKD - managed with lisinopril 10 mg twice daily, labetalol 200 mg twice daily, isosorbide 20 mg 3 times daily, hydralazine 25 mg 3 times daily, and Norvasc 10 mg daily.  He denies dizziness, lightheadedness, chest pain, or shortness of breath. Initially elevated in the office today at 160/100. He is taking his medications daily and has been taking [place]Farxiga.   Review of Systems See HPI   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   GERD (gastroesophageal reflux disease)    Gout 07/26/2007   "on daily RX"  (12/15/2018)   HYPERCHOLESTEROLEMIA, BORDERLINE 01/16/2010   HYPERTENSION 07/26/2007   OSA (obstructive sleep apnea)    "haven't used mask in quite awhile" (12/15/2018)   OSTEOARTHRITIS 09/28/2007   PLANTAR FASCIITIS, BILATERAL 04/19/2010    PROSTATITIS, RECURRENT 11/08/2009   RENAL INSUFFICIENCY 07/26/2007   Sleep apnea    off Cpap    Stroke 01/21/2013   "fully recovered" (12/15/2018)   Type II diabetes mellitus    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   URINARY FREQUENCY, CHRONIC 03/10/2008    Social History   Socioeconomic History   Marital status: Married    Spouse name: [name]   Number of children: 5   Years of education: 12   Highest education level: Not on file  Occupational History   Occupation: TESTOR    Employer: [employer]    Comment: retired  Tobacco Use   Smoking status: Never   Smokeless tobacco: Never  Vaping Use   Vaping Use: Never used  Substance and Sexual Activity   Alcohol use: Not Currently   Drug use: Never   Sexual activity: Not Currently  Other Topics Concern   Not on file  Social History Narrative   He has a part time job for delivering car parts   Married    4 children - All in [place]      He likes to go to car shows and shoot pool.    Social Determinants of Health   Financial Resource Strain: Low Risk  (05/06/2022)   Overall Financial Resource Strain (CARDIA)    Difficulty of Paying Living Expenses: Not hard at all  Food Insecurity: No Food Insecurity (05/06/2022)   Hunger Vital Sign    Worried About Running Out of Food in the Last Year: Never true    Ran Out of Food in the Last Year: Never true  Transportation Needs: No Transportation Needs (05/06/2022)  PRAPARE - Administrator, Civil Service (Medical): No    Lack of Transportation (Non-Medical): No  Physical Activity: Inactive (05/06/2022)   Exercise Vital Sign    Days of Exercise per Week: 0 days    Minutes of Exercise per Session: 0 min  Stress: No Stress Concern Present (05/06/2022)   [employer] of Occupational Health - Occupational Stress Questionnaire    Feeling of Stress : Only a little  Social Connections: Not on file  Intimate Partner Violence: Not on file    Past Surgical History:   Procedure Laterality Date   COLONOSCOPY     CYST EXCISION Left    "leg"   TONSILLECTOMY     UPPER GASTROINTESTINAL ENDOSCOPY     WRIST SURGERY Right    "cut bone out; for gout""    Family History  Problem Relation Age of Onset   Heart disease Mother    Heart disease Father    CVA Father    Colon polyps Father    Diabetes Sister    Colon cancer Neg Hx    Esophageal cancer Neg Hx    Stomach cancer Neg Hx    Pancreatic cancer Neg Hx    Rectal cancer Neg Hx     Allergies  Allergen Reactions   Strawberry Extract Shortness Of Breath   Wellbutrin [Bupropion] Other (See Comments)    " Made me feel weird"     Current Outpatient Medications on File Prior to Visit  Medication Sig Dispense Refill   allopurinol (ZYLOPRIM) 100 MG tablet TAKE 1 TABLET BY MOUTH TWICE A DAY 180 tablet 0   amLODipine (NORVASC) 10 MG tablet TAKE 1 TABLET BY MOUTH EVERY DAY 90 tablet 0   aspirin EC 81 MG tablet Take 81 mg by mouth daily. Swallow whole.     atorvastatin (LIPITOR) 40 MG tablet TAKE 1 TABLET BY MOUTH EVERY DAY 90 tablet 3   blood glucose meter kit and supplies KIT Dispense based on patient and insurance preference. Use up to three times daily as directed. 1 each 0   Cholecalciferol (VITAMIN D-3) 125 MCG (5000 UT) TABS Take 1 tablet by mouth daily.     dapagliflozin propanediol (FARXIGA) 5 MG TABS tablet Take 1 tablet (5 mg total) by mouth daily before breakfast. 90 tablet 1   diclofenac Sodium (VOLTAREN) 1 % GEL Apply 2 g topically 4 (four) times daily. 150 g 1   fexofenadine (ALLEGRA ALLERGY) 60 MG tablet Take 1 tablet (60 mg total) by mouth daily as needed (itching). 30 tablet 0   hydrALAZINE (APRESOLINE) 25 MG tablet Take 1 tablet (25 mg total) by mouth 3 (three) times daily. 270 tablet 0   hydrocortisone cream 1 % Apply 1 Application topically 2 (two) times daily as needed for itching. 14 g 0   isosorbide dinitrate (ISORDIL) 20 MG tablet TAKE 1 TABLET BY MOUTH THREE TIMES A DAY 270 tablet  3   labetalol (NORMODYNE) 200 MG tablet TAKE 2 TABLETS BY MOUTH 2 TIMES DAILY. 360 tablet 2   losartan (COZAAR) 25 MG tablet TAKE 1 TABLET (25 MG TOTAL) BY MOUTH DAILY. 90 tablet 0   pantoprazole (PROTONIX) 40 MG tablet Take 1 tablet (40 mg total) by mouth 2 (two) times daily. Office visit for further refills 180 tablet 0   tadalafil (CIALIS) 10 MG tablet Take 1 tablet (10 mg total) by mouth every other day as needed for erectile dysfunction. 10 tablet 1   No current facility-administered medications on  file prior to visit.    BP (!) 160/100   Pulse 67   Temp 97.6 F (36.4 C) (Oral)   Ht 5' 7.25" (1.708 m)   Wt 234 lb (106.1 kg)   SpO2 98%   BMI 36.38 kg/m       Objective:   Physical Exam Vitals and nursing note reviewed.  Constitutional:      Appearance: Normal appearance.  Cardiovascular:     Rate and Rhythm: Normal rate and regular rhythm.     Pulses: Normal pulses.     Heart sounds: Normal heart sounds.  Pulmonary:     Effort: Pulmonary effort is normal.     Breath sounds: Normal breath sounds.  Skin:    General: Skin is warm and dry.  Neurological:     General: No focal deficit present.     Mental Status: He is alert and oriented to person, place, and time.  Psychiatric:        Mood and Affect: Mood normal.        Behavior: Behavior normal.        Thought Content: Thought content normal.        Judgment: Judgment normal.           Assessment & Plan:  1. Situational depression    04/02/2023   11:51 AM 03/03/2023   11:33 AM 02/13/2023   12:00 PM  Depression screen PHQ 2/9  Decreased Interest 0 2 0  Down, Depressed, Hopeless 0 3 0  PHQ - 2 Score 0 5 0  Altered sleeping 0 0   Tired, decreased energy 0 2   Change in appetite 0 0   Feeling bad or failure about yourself  0 3   Trouble concentrating 0 0   Moving slowly or fidgety/restless 2 3   Suicidal thoughts 0 0   PHQ-9 Score 2 13   Difficult doing work/chores Not difficult at all Somewhat difficult    - Follow up as needed    2. Essential hypertension - BP improved to 140/80 at the end of his visit  - Advised to monitor BP at home  - Avoid nephrotoxic agents.  - Work on weight loss  3. Stage 4 chronic kidney disease - Follow up with Nephrology as directed  Shirline Frees, NP

## 2023-04-17 ENCOUNTER — Telehealth: Payer: Self-pay | Admitting: Adult Health

## 2023-04-17 NOTE — Telephone Encounter (Signed)
Contacted Jared [name] to schedule their annual wellness visit. Appointment made for 05/14/23.  [name] AWV direct phone # [phone]

## 2023-04-21 ENCOUNTER — Encounter: Payer: Self-pay | Admitting: Internal Medicine

## 2023-04-27 ENCOUNTER — Telehealth: Payer: Self-pay | Admitting: Adult Health

## 2023-04-27 NOTE — Telephone Encounter (Signed)
Pt is calling and would like a referral to ENT for ear pain

## 2023-04-28 NOTE — Telephone Encounter (Signed)
Pt has been scheduled for a visit. °

## 2023-04-29 ENCOUNTER — Encounter: Payer: Self-pay | Admitting: Adult Health

## 2023-04-29 ENCOUNTER — Ambulatory Visit (INDEPENDENT_AMBULATORY_CARE_PROVIDER_SITE_OTHER): Payer: Medicare HMO | Admitting: Adult Health

## 2023-04-29 VITALS — BP 148/90 | HR 75 | Temp 98.3°F | Ht 67.5 in | Wt 237.0 lb

## 2023-04-29 DIAGNOSIS — M26609 Unspecified temporomandibular joint disorder, unspecified side: Secondary | ICD-10-CM | POA: Diagnosis not present

## 2023-04-29 MED ORDER — METHYLPREDNISOLONE 4 MG PO TBPK: ORAL_TABLET | ORAL | 0 refills | Status: DC

## 2023-04-29 MED ORDER — CYCLOBENZAPRINE HCL 10 MG PO TABS: 10.0000 mg | ORAL_TABLET | Freq: Every day | ORAL | 0 refills | Status: DC

## 2023-04-29 NOTE — Patient Instructions (Signed)
Your symptoms are consistent with something called TMJ   I have sent in a muscle relaxer called Flexeril to take at night and some prednisone

## 2023-04-29 NOTE — Progress Notes (Signed)
Subjective:    Patient ID: Jared [name], male    DOB: 1946-01-11, 77 y.o.   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  He presents to the office today for an acute visit.  His symptoms have been present for 2 weeks.  Symptoms include right ear pain, the sensation jaw popping and jaw pain. He has difficulty chewing due to the pain.    Review of Systems See HPI   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   GERD (gastroesophageal reflux disease)    Gout 07/26/2007   "on daily RX"  (12/15/2018)   HYPERCHOLESTEROLEMIA, BORDERLINE 01/16/2010   HYPERTENSION 07/26/2007   OSA (obstructive sleep apnea)    "haven't used mask in quite awhile" (12/15/2018)   OSTEOARTHRITIS 09/28/2007   PLANTAR FASCIITIS, BILATERAL 04/19/2010   PROSTATITIS, RECURRENT 11/08/2009   RENAL INSUFFICIENCY 07/26/2007   Sleep apnea    off Cpap    Stroke (HCC) 01/21/2013   "fully recovered" (12/15/2018)   Type II diabetes mellitus (HCC)    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   URINARY FREQUENCY, CHRONIC 03/10/2008    Social History   Socioeconomic History   Marital status: Married    Spouse name: [name]   Number of children: 5   Years of education: 12   Highest education level: Not on file  Occupational History   Occupation: TESTOR    Employer: [employer]    Comment:  retired  Tobacco Use   Smoking status: Never   Smokeless tobacco: Never  Vaping Use   Vaping Use: Never used  Substance and Sexual Activity   Alcohol use: Not Currently   Drug use: Never   Sexual activity: Not Currently  Other Topics Concern   Not on file  Social History Narrative   He has a part time job for delivering car parts   Married    4 children - All in [place]      He likes to go to car shows and shoot pool.    Social Determinants of Health   Financial Resource Strain: Low Risk  (05/06/2022)   Overall Financial Resource Strain (CARDIA)    Difficulty of Paying Living Expenses: Not hard at all  Food Insecurity: No Food Insecurity (05/06/2022)   Hunger Vital Sign    Worried About Running Out of Food in the Last Year: Never true    Ran Out of Food in the Last Year: Never true  Transportation Needs: No Transportation Needs (05/06/2022)   PRAPARE - Administrator, Civil Service (Medical): No    Lack of Transportation (Non-Medical): No  Physical Activity: Inactive (05/06/2022)   Exercise Vital Sign    Days of Exercise per Week: 0 days    Minutes of Exercise per Session: 0 min  Stress: No Stress Concern Present (05/06/2022)   [employer] of Occupational Health - Occupational Stress Questionnaire    Feeling of Stress : Only a little  Social Connections: Not on file  Intimate Partner Violence: Not on file    Past Surgical  History:  Procedure Laterality Date   COLONOSCOPY     CYST EXCISION Left    "leg"   TONSILLECTOMY     UPPER GASTROINTESTINAL ENDOSCOPY     WRIST SURGERY Right    "cut bone out; for gout""    Family History  Problem Relation Age of Onset   Heart disease Mother    Heart disease Father    CVA Father    Colon polyps Father    Diabetes Sister    Colon cancer Neg Hx    Esophageal cancer Neg Hx    Stomach cancer Neg Hx    Pancreatic cancer Neg Hx    Rectal cancer Neg Hx     Allergies  Allergen Reactions   Strawberry Extract  Shortness Of Breath   Wellbutrin [Bupropion] Other (See Comments)    " Made me feel weird"     Current Outpatient Medications on File Prior to Visit  Medication Sig Dispense Refill   allopurinol (ZYLOPRIM) 100 MG tablet TAKE 1 TABLET BY MOUTH TWICE A DAY 180 tablet 0   amLODipine (NORVASC) 10 MG tablet TAKE 1 TABLET BY MOUTH EVERY DAY 90 tablet 0   aspirin EC 81 MG tablet Take 81 mg by mouth daily. Swallow whole.     atorvastatin (LIPITOR) 40 MG tablet TAKE 1 TABLET BY MOUTH EVERY DAY 90 tablet 3   blood glucose meter kit and supplies KIT Dispense based on patient and insurance preference. Use up to three times daily as directed. 1 each 0   Cholecalciferol (VITAMIN D-3) 125 MCG (5000 UT) TABS Take 1 tablet by mouth daily.     dapagliflozin propanediol (FARXIGA) 5 MG TABS tablet Take 1 tablet (5 mg total) by mouth daily before breakfast. 90 tablet 1   diclofenac Sodium (VOLTAREN) 1 % GEL Apply 2 g topically 4 (four) times daily. 150 g 1   fexofenadine (ALLEGRA ALLERGY) 60 MG tablet Take 1 tablet (60 mg total) by mouth daily as needed (itching). 30 tablet 0   hydrALAZINE (APRESOLINE) 25 MG tablet Take 1 tablet (25 mg total) by mouth 3 (three) times daily. 270 tablet 0   isosorbide dinitrate (ISORDIL) 20 MG tablet TAKE 1 TABLET BY MOUTH THREE TIMES A DAY 270 tablet 3   labetalol (NORMODYNE) 200 MG tablet TAKE 2 TABLETS BY MOUTH 2 TIMES DAILY. 360 tablet 2   losartan (COZAAR) 25 MG tablet TAKE 1 TABLET (25 MG TOTAL) BY MOUTH DAILY. 90 tablet 0   pantoprazole (PROTONIX) 40 MG tablet Take 1 tablet (40 mg total) by mouth 2 (two) times daily. Office visit for further refills 180 tablet 0   tadalafil (CIALIS) 10 MG tablet Take 1 tablet (10 mg total) by mouth every other day as needed for erectile dysfunction. 10 tablet 1   No current facility-administered medications on file prior to visit.    BP (!) 148/90   Pulse 75   Temp 98.3 F (36.8 C) (Oral)   Ht 5' 7.5" (1.715 m)   Wt 237 lb (107.5 kg)    SpO2 96%   BMI 36.57 kg/m       Objective:   Physical Exam Vitals and nursing note reviewed.  Constitutional:      Appearance: Normal appearance.  HENT:     Right Ear: Hearing, tympanic membrane, ear canal and external ear normal. No middle ear effusion. Tympanic membrane is not erythematous.     Left Ear: Hearing, tympanic membrane, ear canal and external ear normal.  No middle ear  effusion. Tympanic membrane is not erythematous.  Musculoskeletal:        General: Tenderness (He does have tenderness to the bilateral TMJ.  Experience popping sensation with opening and closing of his mouth health.  Dislocation felt.) present. Normal range of motion.  Skin:    General: Skin is warm and dry.  Neurological:     General: No focal deficit present.     Mental Status: He is alert and oriented to person, place, and time.  Psychiatric:        Mood and Affect: Mood normal.        Behavior: Behavior normal.        Thought Content: Thought content normal.        Judgment: Judgment normal.       Assessment & Plan:  1. TMJ (temporomandibular joint disorder) - He is unable to take NSAIDS d/t CKD. Will prescribe flexeril to take at night and prednisone dose pack  - Follow up if no improvement and can consider imaging and referral to PT   - cyclobenzaprine (FLEXERIL) 10 MG tablet; Take 1 tablet (10 mg total) by mouth at bedtime.  Dispense: 15 tablet; Refill: 0 - methylPREDNISolone (MEDROL DOSEPAK) 4 MG TBPK tablet; Take as directed  Dispense: 21 tablet; Refill: 0  Shirline Frees, NP

## 2023-05-06 ENCOUNTER — Ambulatory Visit: Payer: Medicare HMO | Admitting: Nurse Practitioner

## 2023-05-14 ENCOUNTER — Ambulatory Visit (INDEPENDENT_AMBULATORY_CARE_PROVIDER_SITE_OTHER): Payer: Medicare HMO | Admitting: Family Medicine

## 2023-05-14 ENCOUNTER — Encounter: Payer: Self-pay | Admitting: Family Medicine

## 2023-05-14 DIAGNOSIS — Z0000 Encounter for general adult medical examination without abnormal findings: Secondary | ICD-10-CM | POA: Diagnosis not present

## 2023-05-14 NOTE — Patient Instructions (Signed)
I really enjoyed getting to talk with you today! I am available on Tuesdays and Thursdays for virtual visits if you have any questions or concerns, or if I can be of any further assistance.   CHECKLIST FROM ANNUAL WELLNESS VISIT:  -Follow up (please call to schedule if not scheduled after visit):   -yearly for annual wellness visit with primary care office  Here is a list of your preventive care/health maintenance measures and the plan for each if any are due:  PLAN For any measures below that may be due:  -can get vaccines at the pharmacy - please let us know when done -call to schedule your eye exam -call your gastroenterologist about your colonoscopy/colon cancer screening  Health Maintenance  Topic Date Due   Diabetic kidney evaluation - Urine ACR  06/28/2015   DTaP/Tdap/Td (2 - Tdap) 12/23/2015   Zoster Vaccines- Shingrix (2 of 2) 11/18/2021   OPHTHALMOLOGY EXAM  08/06/2022   Colonoscopy  05/28/2023   COVID-19 Vaccine (4 - 2023-24 season) 05/30/2023    INFLUENZA VACCINE  07/23/2023   HEMOGLOBIN A1C  09/03/2023   FOOT EXAM  12/03/2023   Diabetic kidney evaluation - eGFR measurement  02/14/2024   Medicare Annual Wellness (AWV)  05/13/2024   Pneumonia Vaccine 59+ Years old  Completed   Hepatitis C Screening  Completed   HPV VACCINES  Aged Out    -See a dentist at least yearly  -Get your eyes checked and then per your eye specialist's recommendations  -Other issues addressed today:   -I have included below further information regarding a healthy whole foods based diet, physical activity guidelines for adults, stress management and opportunities for social connections. I hope you find this information useful.    -----------------------------------------------------------------------------------------------------------------------------------------------------------------------------------------------------------------------------------------------------------  NUTRITION: -eat real food: lots of colorful vegetables (half the plate) and fruits -5-7 servings of vegetables and fruits per day (fresh or steamed is best), exp. 2 servings of vegetables with lunch and dinner and 2 servings of fruit per day. Berries and greens such as kale and collards are great choices.  -consume on a regular basis: whole grains (make sure first ingredient on label contains the word "whole"), fresh fruits, fish, nuts, seeds, healthy oils (such as olive oil, avocado oil, grape seed oil) -may eat small amounts of dairy and lean meat on occasion, but avoid processed meats such as ham, bacon, lunch meat, etc. -drink water -try to avoid fast food and pre-packaged foods, processed meat -most experts advise limiting sodium to < 2300mg  per day, should limit further is any chronic conditions such as high blood pressure, heart disease, diabetes, etc. The American Heart Association advised that < 1500mg  is is ideal -try to avoid foods that contain any ingredients with names you do not recognize  -try to avoid sugar/sweets (except for the natural sugar that occurs in fresh fruit) -try to avoid sweet drinks -try to avoid white rice, white bread, pasta (unless whole grain), white or yellow potatoes  EXERCISE GUIDELINES FOR ADULTS: -if you wish to increase your physical activity, do so gradually and with the approval of your doctor -STOP and seek medical care immediately if you have any chest pain, chest discomfort or trouble breathing when starting or increasing exercise  -move and stretch your body, legs, feet and arms when sitting for long periods -Physical activity guidelines for optimal health in adults: -least 150 minutes per week of  aerobic exercise (can talk, but not sing) once approved by your doctor, 20-30 minutes of sustained activity  or two 10 minute episodes of sustained activity every day.  -resistance training at least 2 days per week if approved by your doctor -balance exercises 3+ days per week:   Stand somewhere where you have something sturdy to hold onto if you lose balance.    1) lift up on toes, start with 5x per day and work up to 20x   2) stand and lift on leg straight out to the side so that foot is a few inches of the floor, start with 5x each side and work up to 20x each side   3) stand on one foot, start with 5 seconds each side and work up to 20 seconds on each side  If you need ideas or help with getting more active:  -Silver sneakers https://tools.silversneakers.com  -Walk with a Doc: http://www.duncan-williams.com/  -try to include resistance (weight lifting/strength building) and balance exercises twice per week: or the following link for ideas: http://castillo-powell.com/  BuyDucts.dk  STRESS MANAGEMENT: -can try meditating, or just sitting quietly with deep breathing while intentionally relaxing all parts of your body for 5 minutes daily -if you need further help with stress, anxiety or depression please follow up with your primary doctor or contact the wonderful folks at [facility]: [phone]  SOCIAL CONNECTIONS: -options in [place] if you wish to engage in more social and exercise related activities:  -Silver sneakers https://tools.silversneakers.com  -Walk with a Doc: http://www.duncan-williams.com/  -Check out the [facility] Active Adults 50+ section on the [place] [employer] (hiking clubs, book clubs, cards and games, chess, exercise classes, aquatic classes and much more) - see the website for  details: https://www.[place].gov/departments/parks-recreation/active-adults50  -YouTube has lots of exercise videos for different ages and abilities as well  -[facility] (a variety of indoor and outdoor inperson activities for adults). (248)166-2803. [address].  -Virtual Online Classes (a variety of topics): see seniorplanet.org or call [phone]  -consider volunteering at a school, hospice center, church, senior center or elsewhere

## 2023-05-14 NOTE — Progress Notes (Signed)
PATIENT CHECK-IN and HEALTH RISK ASSESSMENT QUESTIONNAIRE:  -completed by phone/video for upcoming Medicare Preventive Visit  Pre-Visit Check-in: 1)Vitals (height, wt, BP, etc) - record in vitals section for visit on day of visit 2)Review and Update Medications, Allergies PMH, Surgeries, Social history in Epic 3)Hospitalizations in the last year with date/reason? Feb 2024, left leg pain 4)Review and Update Care Team (patient's specialists) in Epic 5) Complete PHQ9 in Epic  6) Complete Fall Screening in Epic 7)Review all Health Maintenance Due and order under PCP if not done.  Medicare Wellness Patient Questionnaire:  Answer theses question about your habits: Do you drink alcohol? No If yes, how many drinks do you have a day? N/A Have you ever smoked?no Quit date if applicable? N/A How many packs a day do/did you smoke? N/A Do you use smokeless tobacco?N/A Do you use an illicit drugs? no Do you exercises? No - but plans to start at [facility] gym next Monday. Are you sexually active? No Number of partners? N/A Typical breakfast: egg mcmuffin from [employer] Typical lunch: "whatever" Typical dinner: just depends what he has at home, sandwich or salad Typical snacks: N/A  Beverages: water  Answer theses question about you: Can you perform most household chores? yes Do you find it hard to follow a conversation in a noisy room? sometimes Do you often ask people to speak up or repeat themselves? Yes - but feels is fine Do you feel that you have a problem with memory? " A little bit" but feels is fine Do you balance your checkbook and or bank acounts? yes Do you feel safe at home? yes Last dentist visit? unsure Do you need assistance with any of the following? None  Driving?  Feeding yourself?  Getting from bed to chair?  Getting to the toilet?  Bathing or showering?  Dressing yourself?  Managing money?  Climbing a flight of stairs  Preparing meals?    Do you have Advanced  Directives in place (Living Will, Healthcare Power or Attorney)?  no   Last eye Exam and location? Due for one now - plans to schedule.    Do you currently use prescribed or non-prescribed narcotic or opioid pain medications? no  Do you have a history or close family history of breast, ovarian, tubal or peritoneal cancer or a family member with BRCA (breast cancer susceptibility 1 and 2) Therron mutations? Father had testicular cancer  Nurse/Assistant Credentials/time stamp: Laddie Aquas, CMA 12:32 PM ----------------------------------------------------------------------------------------------------------------------------------------------------------------------------------------------------------------------    MEDICARE ANNUAL PREVENTIVE CARE VISIT WITH PROVIDER (Welcome to Medicare, initial annual wellness or annual wellness exam)  Virtual Visit via Phone Note  I connected with Jared [name] on 05/14/23  by phone and verified that I am speaking with the correct person using two identifiers.  Location patient: home Location provider:work or home office Persons participating in the virtual visit: patient, provider  Concerns and/or follow up today: saw PCP recently - reports is doing ok. Reports better than last visit.    See HM section in Epic for other details of completed HM.    ROS: negative for report of fevers, unintentional weight loss, vision changes, vision loss, hearing loss or change, chest pain, sob, hemoptysis, melena, hematochezia, hematuria, falls, bleeding or bruising, thoughts of suicide or self harm, memory loss  Patient-completed extensive health risk assessment - reviewed and discussed with the patient: See Health Risk Assessment completed with patient prior to the visit either above or in recent phone note. This was reviewed in detailed with the  patient today and appropriate recommendations, orders and referrals were placed as needed per Summary below and  patient instructions.   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   GERD (gastroesophageal reflux disease)    Gout 07/26/2007   "on daily RX"  (12/15/2018)   HYPERCHOLESTEROLEMIA, BORDERLINE 01/16/2010   HYPERTENSION 07/26/2007   OSA (obstructive sleep apnea)    "haven't used mask in quite awhile" (12/15/2018)   OSTEOARTHRITIS 09/28/2007   PLANTAR FASCIITIS, BILATERAL 04/19/2010   PROSTATITIS, RECURRENT 11/08/2009   RENAL INSUFFICIENCY 07/26/2007   Sleep apnea    off Cpap    Stroke (HCC) 01/21/2013   "fully recovered" (12/15/2018)   Type II diabetes mellitus (HCC)    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   URINARY FREQUENCY, CHRONIC 03/10/2008    Past Surgical History:  Procedure Laterality Date   COLONOSCOPY     CYST EXCISION Left    "leg"   TONSILLECTOMY     UPPER GASTROINTESTINAL ENDOSCOPY     WRIST SURGERY Right    "cut bone out; for gout""    Social History   Socioeconomic History   Marital status: Married    Spouse name: [name]   Number of children: 5   Years of education: 12   Highest education level: Not on file  Occupational History   Occupation: TESTOR    Employer: [employer]    Comment: retired  Tobacco Use   Smoking status: Never   Smokeless tobacco: Never  Vaping Use   Vaping Use: Never used  Substance and Sexual Activity   Alcohol use: Not Currently   Drug use: Never   Sexual activity: Not Currently  Other Topics Concern    Not on file  Social History Narrative   He has a part time job for delivering car parts   Married    4 children - All in [place]      He likes to go to car shows and shoot pool.    Social Determinants of Health   Financial Resource Strain: Low Risk  (05/06/2022)   Overall Financial Resource Strain (CARDIA)    Difficulty of Paying Living Expenses: Not hard at all  Food Insecurity: No Food Insecurity (05/06/2022)   Hunger Vital Sign    Worried About Running Out of Food in the Last Year: Never true    Ran Out of Food in the Last Year: Never true  Transportation Needs: No Transportation Needs (05/06/2022)   PRAPARE - Administrator, Civil Service (Medical): No    Lack of Transportation (Non-Medical): No  Physical Activity: Inactive (05/06/2022)   Exercise Vital Sign    Days of Exercise per Week: 0 days    Minutes of Exercise per Session: 0 min  Stress: No Stress Concern Present (05/06/2022)   [employer] of Occupational Health - Occupational Stress Questionnaire    Feeling of Stress : Only a little  Social Connections: Not on file  Intimate Partner Violence: Not on file    Family History  Problem Relation Age of Onset  Heart disease Mother    Heart disease Father    CVA Father    Colon polyps Father    Diabetes Sister    Colon cancer Neg Hx    Esophageal cancer Neg Hx    Stomach cancer Neg Hx    Pancreatic cancer Neg Hx    Rectal cancer Neg Hx     Current Outpatient Medications on File Prior to Visit  Medication Sig Dispense Refill   allopurinol (ZYLOPRIM) 100 MG tablet TAKE 1 TABLET BY MOUTH TWICE A DAY 180 tablet 0   amLODipine (NORVASC) 10 MG tablet TAKE 1 TABLET BY MOUTH EVERY DAY 90 tablet 0   aspirin EC 81 MG tablet Take 81 mg by mouth daily. Swallow whole.     atorvastatin (LIPITOR) 40 MG tablet TAKE 1 TABLET BY MOUTH EVERY DAY 90 tablet 3   blood glucose meter kit and supplies KIT Dispense based on patient and insurance preference. Use up to three times  daily as directed. 1 each 0   Cholecalciferol (VITAMIN D-3) 125 MCG (5000 UT) TABS Take 1 tablet by mouth daily.     cyclobenzaprine (FLEXERIL) 10 MG tablet Take 1 tablet (10 mg total) by mouth at bedtime. 15 tablet 0   dapagliflozin propanediol (FARXIGA) 5 MG TABS tablet Take 1 tablet (5 mg total) by mouth daily before breakfast. 90 tablet 1   diclofenac Sodium (VOLTAREN) 1 % GEL Apply 2 g topically 4 (four) times daily. 150 g 1   fexofenadine (ALLEGRA ALLERGY) 60 MG tablet Take 1 tablet (60 mg total) by mouth daily as needed (itching). 30 tablet 0   hydrALAZINE (APRESOLINE) 25 MG tablet Take 1 tablet (25 mg total) by mouth 3 (three) times daily. 270 tablet 0   isosorbide dinitrate (ISORDIL) 20 MG tablet TAKE 1 TABLET BY MOUTH THREE TIMES A DAY 270 tablet 3   labetalol (NORMODYNE) 200 MG tablet TAKE 2 TABLETS BY MOUTH 2 TIMES DAILY. 360 tablet 2   losartan (COZAAR) 25 MG tablet TAKE 1 TABLET (25 MG TOTAL) BY MOUTH DAILY. 90 tablet 0   pantoprazole (PROTONIX) 40 MG tablet Take 1 tablet (40 mg total) by mouth 2 (two) times daily. Office visit for further refills 180 tablet 0   tadalafil (CIALIS) 10 MG tablet Take 1 tablet (10 mg total) by mouth every other day as needed for erectile dysfunction. (Patient not taking: Reported on 05/14/2023) 10 tablet 1   No current facility-administered medications on file prior to visit.    Allergies  Allergen Reactions   Strawberry Extract Shortness Of Breath   Wellbutrin [Bupropion] Other (See Comments)    " Made me feel weird"        Physical Exam There were no vitals filed for this visit. Estimated body mass index is 36.57 kg/m as calculated from the following:   Height as of 04/29/23: 5' 7.5" (1.715 m).   Weight as of 04/29/23: 237 lb (107.5 kg).  EKG (optional): deferred due to virtual visit  GENERAL: alert, oriented, no acute distress detected; full vision exam deferred due to pandemic and/or virtual encounter  PSYCH/NEURO: pleasant and  cooperative, no obvious depression or anxiety, speech and thought processing grossly intact, Cognitive function grossly intact  Flowsheet Row Office Visit from [facility]/14/20[facility] in [facility]  PHQ-9 Total Score 8           05/14/2023   12:24 PM 04/02/2023   11:51 AM 03/03/2023   11:33 AM 02/13/2023   12:00 PM  08/14/2022    2:31 PM  Depression screen PHQ 2/9  Decreased Interest 1 0 2 0 2  Down, Depressed, Hopeless 1 0 3 0 0  PHQ - 2 Score 2 0 5 0 2  Altered sleeping 3 0 0  0  Tired, decreased energy 2 0 2  2  Change in appetite 1 0 0  0  Feeling bad or failure about yourself  0 0 3  0  Trouble concentrating 0 0 0  2  Moving slowly or fidgety/restless 0 2 3  2   Suicidal thoughts 0 0 0  0  PHQ-9 Score 8 2 13  8   Difficult doing work/chores Somewhat difficult Not difficult at all Somewhat difficult  Not difficult at all       05/06/2022   12:12 PM 08/07/2022    2:29 PM 08/14/2022    2:30 PM 02/13/2023   12:00 PM 05/14/2023   12:23 PM  Fall Risk  Falls in the past year? 1  1 0 1  Was there an injury with Fall? 0  1 0 1  Fall Risk Category Calculator 2  3 0 2  Fall Risk Category (Retired) Moderate  High    (RETIRED) Patient Fall Risk Level Low fall risk Low fall risk High fall risk    Patient at Risk for Falls Due to Impaired balance/gait;Medication side effect  Other (Comment) Other (Comment) No Fall Risks  Fall risk Follow up Falls evaluation completed;Education provided;Falls prevention discussed  Falls evaluation completed Falls evaluation completed Falls prevention discussed     SUMMARY AND PLAN:  Encounter for Medicare annual wellness exam    Discussed applicable health maintenance/preventive health measures and advised and referred or ordered per patient preferences: -discussed all measures due - for vaccines let him know which he could get at office versus pharmacy. Also advised if gets at pharmacy to let us know so that we can update. -he  reports he will call to schedule his eye exam  Health Maintenance  Topic Date Due   Diabetic kidney evaluation - Urine ACR  06/28/2015   DTaP/Tdap/Td (2 - Tdap) 12/23/2015   Zoster Vaccines- Shingrix (2 of 2) 11/18/2021   OPHTHALMOLOGY EXAM  08/06/2022   Colonoscopy  05/28/2023   COVID-19 Vaccine (4 - 2023-24 season) 05/30/2023 (Originally 08/22/2022)   INFLUENZA VACCINE  07/23/2023   HEMOGLOBIN A1C  09/03/2023   FOOT EXAM  12/03/2023   Diabetic kidney evaluation - eGFR measurement  02/14/2024   Medicare Annual Wellness (AWV)  05/13/2024   Pneumonia Vaccine 51+ Years old  Completed   Hepatitis C Screening  Completed   HPV VACCINES  Aged [employer] and counseling on the following was provided based on the above review of health and a plan/checklist for the patient, along with additional information discussed, was provided for the patient in the patient instructions :  -Advised on importance of completing advanced directives, discussed options for completing and provided information in patient instructions as well -Provided counseling and plan for increased risk of falling if applicable per above screening. He reports he plans to work with a trainer starting this Monday.  Provided in pt instructions safe balance exercises that can be done at home to improve balance and discussed exercise guidelines for adults with include balance exercises at least 3 days per week.  -Advised and counseled on a healthy lifestyle - including the importance of a healthy diet, regular physical activity, social connections and stress management. -Reviewed patient's current  diet. Advised and counseled on a whole foods based healthy diet. A summary of a healthy diet was provided in the Patient Instructions.  -reviewed patient's current physical activity level and discussed exercise guidelines for adults. Discussed community resources and ideas for safe exercise at home to assist in meeting exercise guideline  recommendations in a safe and healthy way.  -Advise yearly dental visits at minimum and regular eye exams   Follow up: see patient instructions   Patient Instructions  I really enjoyed getting to talk with you today! I am available on Tuesdays and Thursdays for virtual visits if you have any questions or concerns, or if I can be of any further assistance.   CHECKLIST FROM ANNUAL WELLNESS VISIT:  -Follow up (please call to schedule if not scheduled after visit):   -yearly for annual wellness visit with primary care office  Here is a list of your preventive care/health maintenance measures and the plan for each if any are due:  PLAN For any measures below that may be due:  -can get vaccines at the pharmacy - please let us know when done -call to schedule your eye exam -call your gastroenterologist about your colonoscopy/colon cancer screening  Health Maintenance  Topic Date Due   Diabetic kidney evaluation - Urine ACR  06/28/2015   DTaP/Tdap/Td (2 - Tdap) 12/23/2015   Zoster Vaccines- Shingrix (2 of 2) 11/18/2021   OPHTHALMOLOGY EXAM  08/06/2022   Colonoscopy  05/28/2023   COVID-19 Vaccine (4 - 2023-24 season) 05/30/2023    INFLUENZA VACCINE  07/23/2023   HEMOGLOBIN A1C  09/03/2023   FOOT EXAM  12/03/2023   Diabetic kidney evaluation - eGFR measurement  02/14/2024   Medicare Annual Wellness (AWV)  05/13/2024   Pneumonia Vaccine 54+ Years old  Completed   Hepatitis C Screening  Completed   HPV VACCINES  Aged Out    -See a dentist at least yearly  -Get your eyes checked and then per your eye specialist's recommendations  -Other issues addressed today:   -I have included below further information regarding a healthy whole foods based diet, physical activity guidelines for adults, stress management and opportunities for social connections. I hope you find this information useful.    -----------------------------------------------------------------------------------------------------------------------------------------------------------------------------------------------------------------------------------------------------------  NUTRITION: -eat real food: lots of colorful vegetables (half the plate) and fruits -5-7 servings of vegetables and fruits per day (fresh or steamed is best), exp. 2 servings of vegetables with lunch and dinner and 2 servings of fruit per day. Berries and greens such as kale and collards are great choices.  -consume on a regular basis: whole grains (make sure first ingredient on label contains the word "whole"), fresh fruits, fish, nuts, seeds, healthy oils (such as olive oil, avocado oil, grape seed oil) -may eat small amounts of dairy and lean meat on occasion, but avoid processed meats such as ham, bacon, lunch meat, etc. -drink water -try to avoid fast food and pre-packaged foods, processed meat -most experts advise limiting sodium to < 2300mg  per day, should limit further is any chronic conditions such as high blood pressure, heart disease, diabetes, etc. The American Heart Association advised that < 1500mg  is is ideal -try to avoid foods that contain any ingredients with names you do not recognize  -try to avoid sugar/sweets (except for the natural sugar that occurs in fresh fruit) -try to avoid sweet drinks -try to avoid white rice, white bread, pasta (unless whole grain), white or yellow potatoes  EXERCISE GUIDELINES FOR ADULTS: -if you wish to  increase your physical activity, do so gradually and with the approval of your doctor -STOP and seek medical care immediately if you have any chest pain, chest discomfort or trouble breathing when starting or increasing exercise  -move and stretch your body, legs, feet and arms when sitting for long periods -Physical activity guidelines for optimal health in adults: -least 150 minutes per week of  aerobic exercise (can talk, but not sing) once approved by your doctor, 20-30 minutes of sustained activity or two 10 minute episodes of sustained activity every day.  -resistance training at least 2 days per week if approved by your doctor -balance exercises 3+ days per week:   Stand somewhere where you have something sturdy to hold onto if you lose balance.    1) lift up on toes, start with 5x per day and work up to 20x   2) stand and lift on leg straight out to the side so that foot is a few inches of the floor, start with 5x each side and work up to 20x each side   3) stand on one foot, start with 5 seconds each side and work up to 20 seconds on each side  If you need ideas or help with getting more active:  -Silver sneakers https://tools.silversneakers.com  -Walk with a Doc: http://www.duncan-williams.com/  -try to include resistance (weight lifting/strength building) and balance exercises twice per week: or the following link for ideas: http://castillo-powell.com/  BuyDucts.dk  STRESS MANAGEMENT: -can try meditating, or just sitting quietly with deep breathing while intentionally relaxing all parts of your body for 5 minutes daily -if you need further help with stress, anxiety or depression please follow up with your primary doctor or contact the wonderful folks at [facility]: [phone]  SOCIAL CONNECTIONS: -options in [place] if you wish to engage in more social and exercise related activities:  -Silver sneakers https://tools.silversneakers.com  -Walk with a Doc: http://www.duncan-williams.com/  -Check out the [facility] Active Adults 50+ section on the [place] of [employer] (hiking clubs, book clubs, cards and games, chess, exercise classes, aquatic classes and much more) - see the website for  details: https://www.[place].gov/departments/parks-recreation/active-adults50  -YouTube has lots of exercise videos for different ages and abilities as well  -[facility] (a variety of indoor and outdoor inperson activities for adults). [phone]. [address]  -Virtual Online Classes (a variety of topics): see seniorplanet.org or call [phone]  -consider volunteering at a school, hospice center, church, senior center or elsewhere           Terressa Koyanagi, DO

## 2023-05-25 ENCOUNTER — Other Ambulatory Visit: Payer: Self-pay | Admitting: Adult Health

## 2023-05-25 DIAGNOSIS — M26609 Unspecified temporomandibular joint disorder, unspecified side: Secondary | ICD-10-CM

## 2023-06-19 ENCOUNTER — Other Ambulatory Visit: Payer: Self-pay | Admitting: Internal Medicine

## 2023-06-19 DIAGNOSIS — K219 Gastro-esophageal reflux disease without esophagitis: Secondary | ICD-10-CM

## 2023-06-19 DIAGNOSIS — K2970 Gastritis, unspecified, without bleeding: Secondary | ICD-10-CM

## 2023-06-19 DIAGNOSIS — K2101 Gastro-esophageal reflux disease with esophagitis, with bleeding: Secondary | ICD-10-CM

## 2023-06-19 DIAGNOSIS — K222 Esophageal obstruction: Secondary | ICD-10-CM

## 2023-07-03 ENCOUNTER — Other Ambulatory Visit: Payer: Self-pay | Admitting: Internal Medicine

## 2023-07-03 DIAGNOSIS — K222 Esophageal obstruction: Secondary | ICD-10-CM

## 2023-07-03 DIAGNOSIS — K2970 Gastritis, unspecified, without bleeding: Secondary | ICD-10-CM

## 2023-07-03 DIAGNOSIS — K2101 Gastro-esophageal reflux disease with esophagitis, with bleeding: Secondary | ICD-10-CM

## 2023-07-03 DIAGNOSIS — K219 Gastro-esophageal reflux disease without esophagitis: Secondary | ICD-10-CM

## 2023-07-04 ENCOUNTER — Other Ambulatory Visit: Payer: Self-pay | Admitting: Adult Health

## 2023-07-04 DIAGNOSIS — M1A00X1 Idiopathic chronic gout, unspecified site, with tophus (tophi): Secondary | ICD-10-CM

## 2023-07-07 ENCOUNTER — Other Ambulatory Visit: Payer: Self-pay | Admitting: Internal Medicine

## 2023-07-07 ENCOUNTER — Encounter: Payer: Self-pay | Admitting: Physician Assistant

## 2023-07-07 DIAGNOSIS — K222 Esophageal obstruction: Secondary | ICD-10-CM

## 2023-07-07 DIAGNOSIS — N184 Chronic kidney disease, stage 4 (severe): Secondary | ICD-10-CM | POA: Diagnosis not present

## 2023-07-07 DIAGNOSIS — K219 Gastro-esophageal reflux disease without esophagitis: Secondary | ICD-10-CM

## 2023-07-07 DIAGNOSIS — K2970 Gastritis, unspecified, without bleeding: Secondary | ICD-10-CM

## 2023-07-07 DIAGNOSIS — K2101 Gastro-esophageal reflux disease with esophagitis, with bleeding: Secondary | ICD-10-CM

## 2023-07-07 NOTE — Telephone Encounter (Signed)
PT is calling to get a refill on protonix. He scheduled and OV for 9/27 but will run out. Asking a refill be sent to [facility].

## 2023-07-08 ENCOUNTER — Other Ambulatory Visit: Payer: Self-pay

## 2023-07-08 DIAGNOSIS — K219 Gastro-esophageal reflux disease without esophagitis: Secondary | ICD-10-CM

## 2023-07-08 DIAGNOSIS — K222 Esophageal obstruction: Secondary | ICD-10-CM

## 2023-07-08 DIAGNOSIS — K2101 Gastro-esophageal reflux disease with esophagitis, with bleeding: Secondary | ICD-10-CM

## 2023-07-08 DIAGNOSIS — K2970 Gastritis, unspecified, without bleeding: Secondary | ICD-10-CM

## 2023-07-08 MED ORDER — PANTOPRAZOLE SODIUM 40 MG PO TBEC
40.0000 mg | DELAYED_RELEASE_TABLET | Freq: Two times a day (BID) | ORAL | 1 refills | Status: AC
Start: 2023-07-08 — End: ?

## 2023-07-12 ENCOUNTER — Other Ambulatory Visit: Payer: Self-pay | Admitting: Adult Health

## 2023-07-12 DIAGNOSIS — M26609 Unspecified temporomandibular joint disorder, unspecified side: Secondary | ICD-10-CM

## 2023-07-12 DIAGNOSIS — M1A00X1 Idiopathic chronic gout, unspecified site, with tophus (tophi): Secondary | ICD-10-CM

## 2023-07-12 DIAGNOSIS — F4321 Adjustment disorder with depressed mood: Secondary | ICD-10-CM

## 2023-07-12 DIAGNOSIS — I10 Essential (primary) hypertension: Secondary | ICD-10-CM

## 2023-07-14 NOTE — Telephone Encounter (Signed)
Name from pharmacy: BUPROPION HCL XL 150 MG TABLET        Will file in chart as: buPROPion (WELLBUTRIN XL) 150 MG 24 hr tablet   The original prescription was discontinued on 04/02/2023 by Shirline Frees, NP. Renewing this prescription may not be appropriate.

## 2023-08-16 ENCOUNTER — Other Ambulatory Visit: Payer: Self-pay | Admitting: Adult Health

## 2023-08-16 DIAGNOSIS — F4321 Adjustment disorder with depressed mood: Secondary | ICD-10-CM

## 2023-08-16 DIAGNOSIS — M26609 Unspecified temporomandibular joint disorder, unspecified side: Secondary | ICD-10-CM

## 2023-08-19 ENCOUNTER — Encounter: Payer: Self-pay | Admitting: Adult Health

## 2023-08-19 ENCOUNTER — Ambulatory Visit (INDEPENDENT_AMBULATORY_CARE_PROVIDER_SITE_OTHER): Payer: Medicare HMO | Admitting: Adult Health

## 2023-08-19 VITALS — BP 130/88 | HR 74 | Temp 98.1°F | Ht 67.5 in | Wt 244.0 lb

## 2023-08-19 DIAGNOSIS — E1122 Type 2 diabetes mellitus with diabetic chronic kidney disease: Secondary | ICD-10-CM

## 2023-08-19 DIAGNOSIS — N184 Chronic kidney disease, stage 4 (severe): Secondary | ICD-10-CM

## 2023-08-19 DIAGNOSIS — I10 Essential (primary) hypertension: Secondary | ICD-10-CM

## 2023-08-19 DIAGNOSIS — Z7984 Long term (current) use of oral hypoglycemic drugs: Secondary | ICD-10-CM

## 2023-08-19 LAB — POCT GLYCOSYLATED HEMOGLOBIN (HGB A1C): Hemoglobin A1C: 6.2 % — AB (ref 4.0–5.6)

## 2023-08-19 MED ORDER — DAPAGLIFLOZIN PROPANEDIOL 5 MG PO TABS
5.0000 mg | ORAL_TABLET | Freq: Every day | ORAL | 1 refills | Status: DC
Start: 2023-08-19 — End: 2024-04-19

## 2023-08-19 NOTE — Progress Notes (Addendum)
Subjective:    Patient ID: Jared [name], male    DOB: [birth year], 77 y.o.   MRN: [id number]  HPI  He presents to the office today for follow up regarding DM and HTN   DM Type 2 -currently managed with Farxiga 5 mg daily. In the past he was on metformin - this was d/c due to CKD stage 4.  He does not monitor his blood sugars at home.  Denies episodes of hypoglycemia. His last A1c was 6.1  HTN -managed with lisinopril 10 mg twice daily, labetalol 200 mg twice daily, isosorbide 20 mg 3 times daily, and Norvasc 10 mg daily.  He denies dizziness, lightheadedness, chest pain, or shortness of breath. He reports that two days last week he had intermittent episodes of elevated blood pressure into the 200's systolic. He was symptomatic with a headache. He had been taking his blood pressure medications as directed, was not adding salt to his diet or eating out a lot.   BP Readings from Last 3 Encounters:  08/19/23 130/88  04/29/23 (!) 148/90  04/02/23 (!) 140/80    Review of Systems See HPI   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   GERD (gastroesophageal reflux disease)    Gout 07/26/2007   "on daily RX"  (12/15/2018)   HYPERCHOLESTEROLEMIA, BORDERLINE 01/16/2010   HYPERTENSION 07/26/2007   OSA (obstructive sleep apnea)    "haven't used mask in quite awhile" (12/15/2018)   OSTEOARTHRITIS 09/28/2007   PLANTAR FASCIITIS, BILATERAL 04/19/2010   PROSTATITIS, RECURRENT 11/08/2009   RENAL INSUFFICIENCY 07/26/2007   Sleep apnea    off Cpap    Stroke (HCC) 01/21/2013   "fully recovered" (12/15/2018)   Type II diabetes mellitus (HCC)    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   URINARY FREQUENCY, CHRONIC 03/10/2008    Social History   Socioeconomic History   Marital status: Married    Spouse name: [name]   Number of children: 5   Years of education: 12   Highest education level: Not on file  Occupational  History   Occupation: TESTOR    Employer: [employer]    Comment: retired  Tobacco Use   Smoking status: Never   Smokeless tobacco: Never  Vaping Use   Vaping status: Never Used  Substance and Sexual Activity   Alcohol use: Not Currently   Drug use: Never   Sexual activity: Not Currently  Other Topics Concern   Not on file  Social History Narrative   He has a part time job for delivering car parts   Married    4 children - All in [place]      He likes to go to car shows and shoot pool.    Social Determinants of Health   Financial Resource Strain: Low Risk  (05/06/2022)   Overall Financial Resource Strain (CARDIA)    Difficulty of Paying Living Expenses: Not hard at all  Food Insecurity: No Food Insecurity (05/06/2022)   Hunger Vital Sign    Worried About Running Out of Food in the Last Year: Never true    Ran Out of Food in the Last Year: Never true  Transportation Needs: No Transportation Needs (05/06/2022)   PRAPARE - Administrator, Civil Service (Medical): No    Lack of Transportation (Non-Medical): No  Physical Activity: Inactive (05/06/2022)   Exercise Vital Sign    Days of Exercise per Week: 0 days  Minutes of Exercise per Session: 0 min  Stress: No Stress Concern Present (05/06/2022)   [employer] of Occupational Health - Occupational Stress Questionnaire    Feeling of Stress : Only a little  Social Connections: Not on file  Intimate Partner Violence: Not on file    Past Surgical History:  Procedure Laterality Date   COLONOSCOPY     CYST EXCISION Left    "leg"   TONSILLECTOMY     UPPER GASTROINTESTINAL ENDOSCOPY     WRIST SURGERY Right    "cut bone out; for gout""    Family History  Problem Relation Age of Onset   Heart disease Mother    Heart disease Father    CVA Father    Colon polyps Father    Diabetes Sister    Colon cancer Neg Hx    Esophageal cancer Neg Hx    Stomach cancer Neg Hx    Pancreatic cancer Neg Hx    Rectal cancer Neg  Hx     Allergies  Allergen Reactions   Strawberry Extract Shortness Of Breath   Wellbutrin [Bupropion] Other (See Comments)    " Made me feel weird"     Current Outpatient Medications on File Prior to Visit  Medication Sig Dispense Refill   allopurinol (ZYLOPRIM) 100 MG tablet TAKE 1 TABLET BY MOUTH TWICE A DAY 180 tablet 0   amLODipine (NORVASC) 10 MG tablet TAKE 1 TABLET BY MOUTH EVERY DAY 90 tablet 0   aspirin EC 81 MG tablet Take 81 mg by mouth daily. Swallow whole.     blood glucose meter kit and supplies KIT Dispense based on patient and insurance preference. Use up to three times daily as directed. 1 each 0   Cholecalciferol (VITAMIN D-3) 125 MCG (5000 UT) TABS Take 1 tablet by mouth daily.     cyclobenzaprine (FLEXERIL) 10 MG tablet TAKE 1 TABLET BY MOUTH EVERYDAY AT BEDTIME 15 tablet 0   fexofenadine (ALLEGRA ALLERGY) 60 MG tablet Take 1 tablet (60 mg total) by mouth daily as needed (itching). 30 tablet 0   isosorbide dinitrate (ISORDIL) 20 MG tablet TAKE 1 TABLET BY MOUTH THREE TIMES A DAY 270 tablet 3   labetalol (NORMODYNE) 200 MG tablet TAKE 2 TABLETS BY MOUTH TWICE A DAY 360 tablet 2   losartan (COZAAR) 25 MG tablet TAKE 1 TABLET (25 MG TOTAL) BY MOUTH DAILY. 90 tablet 0   pantoprazole (PROTONIX) 40 MG tablet Take 1 tablet (40 mg total) by mouth 2 (two) times daily. Office visit for further refills 180 tablet 1   tadalafil (CIALIS) 10 MG tablet Take 1 tablet (10 mg total) by mouth every other day as needed for erectile dysfunction. 10 tablet 1   atorvastatin (LIPITOR) 40 MG tablet TAKE 1 TABLET BY MOUTH EVERY DAY 90 tablet 3   hydrALAZINE (APRESOLINE) 25 MG tablet Take 1 tablet (25 mg total) by mouth 3 (three) times daily. 270 tablet 0   No current facility-administered medications on file prior to visit.    BP 130/88   Pulse 74   Temp 98.1 F (36.7 C) (Oral)   Ht 5' 7.5" (1.715 m)   Wt 244 lb (110.7 kg)   SpO2 98%   BMI 37.65 kg/m       Objective:   Physical  Exam Vitals and nursing note reviewed.  Constitutional:      Appearance: Normal appearance.  Cardiovascular:     Rate and Rhythm: Normal rate and regular rhythm.  Pulses: Normal pulses.     Heart sounds: Normal heart sounds.  Pulmonary:     Effort: Pulmonary effort is normal.     Breath sounds: Normal breath sounds.  Musculoskeletal:        General: Normal range of motion.  Skin:    General: Skin is warm and dry.     Capillary Refill: Capillary refill takes less than 2 seconds.  Neurological:     General: No focal deficit present.     Mental Status: He is alert and oriented to person, place, and time.  Psychiatric:        Mood and Affect: Mood normal.        Behavior: Behavior normal.        Thought Content: Thought content normal.        Judgment: Judgment normal.        Assessment & Plan:  1. Type 2 diabetes mellitus with stage 4 chronic kidney disease, without long-term current use of insulin (HCC)  - POC HgB A1c- 6.2 - at goal  - dapagliflozin propanediol (FARXIGA) 5 MG TABS tablet; Take 1 tablet (5 mg total) by mouth daily before breakfast.  Dispense: 90 tablet; Refill: 1  2. Essential hypertension - BP at goal today.  - I cannot explain his sudden raise in BP but it is back to normal  - dapagliflozin propanediol (FARXIGA) 5 MG TABS tablet; Take 1 tablet (5 mg total) by mouth daily before breakfast.  Dispense: 90 tablet; Refill: 1  3. Stage 4 chronic kidney disease (HCC)  - dapagliflozin propanediol (FARXIGA) 5 MG TABS tablet; Take 1 tablet (5 mg total) by mouth daily before breakfast.  Dispense: 90 tablet; Refill: 1  Shirline Frees, NP

## 2023-08-19 NOTE — Patient Instructions (Addendum)
It was great seeing you today   Your A1c was 6.2 - this is great   I cannot explain the elevated blood pressure you experienced   Please follow up after December 12th for your physical exam

## 2023-09-01 ENCOUNTER — Telehealth: Payer: Self-pay | Admitting: Adult Health

## 2023-09-01 ENCOUNTER — Other Ambulatory Visit: Payer: Self-pay | Admitting: Adult Health

## 2023-09-01 DIAGNOSIS — I10 Essential (primary) hypertension: Secondary | ICD-10-CM

## 2023-09-01 DIAGNOSIS — E1121 Type 2 diabetes mellitus with diabetic nephropathy: Secondary | ICD-10-CM

## 2023-09-01 DIAGNOSIS — Z0000 Encounter for general adult medical examination without abnormal findings: Secondary | ICD-10-CM

## 2023-09-01 DIAGNOSIS — E782 Mixed hyperlipidemia: Secondary | ICD-10-CM

## 2023-09-01 DIAGNOSIS — M1A09X0 Idiopathic chronic gout, multiple sites, without tophus (tophi): Secondary | ICD-10-CM

## 2023-09-01 DIAGNOSIS — N1832 Chronic kidney disease, stage 3b: Secondary | ICD-10-CM

## 2023-09-01 MED ORDER — AMLODIPINE BESYLATE 10 MG PO TABS: 10.0000 mg | ORAL_TABLET | Freq: Every day | ORAL | 0 refills | Status: DC

## 2023-09-01 MED ORDER — ISOSORBIDE DINITRATE 20 MG PO TABS: 20.0000 mg | ORAL_TABLET | Freq: Three times a day (TID) | ORAL | 3 refills | Status: DC

## 2023-09-01 MED ORDER — LOSARTAN POTASSIUM 25 MG PO TABS: 25.0000 mg | ORAL_TABLET | Freq: Every day | ORAL | 0 refills | Status: DC

## 2023-09-01 NOTE — Telephone Encounter (Signed)
Prescription Request  09/01/2023  LOV: 08/19/2023  What is the name of the medication or equipment?   amLODipine (NORVASC) 10 MG tablet   losartan (COZAAR) 25 MG tablet   Have you contacted your pharmacy to request a refill? Yes   Which pharmacy would you like this sent to?  [id number] [place], [place] - [address] Phone: [phone]  Fax: [phone]      Patient notified that their request is being sent to the clinical staff for review and that they should receive a response within 2 business days.   Please advise at Mobile [phone] (mobile)

## 2023-09-01 NOTE — Telephone Encounter (Signed)
Also requesting isosorbide dinitrate (ISORDIL) 20 MG tablet

## 2023-09-01 NOTE — Telephone Encounter (Signed)
Rx refilled. Pt wife notified of update.

## 2023-09-04 ENCOUNTER — Other Ambulatory Visit: Payer: Self-pay | Admitting: Adult Health

## 2023-09-04 DIAGNOSIS — M26609 Unspecified temporomandibular joint disorder, unspecified side: Secondary | ICD-10-CM

## 2023-09-18 ENCOUNTER — Ambulatory Visit: Payer: Medicare HMO | Admitting: Physician Assistant

## 2023-09-18 ENCOUNTER — Other Ambulatory Visit: Payer: Self-pay

## 2023-09-18 DIAGNOSIS — K219 Gastro-esophageal reflux disease without esophagitis: Secondary | ICD-10-CM

## 2023-09-18 DIAGNOSIS — K2970 Gastritis, unspecified, without bleeding: Secondary | ICD-10-CM

## 2023-09-18 DIAGNOSIS — K222 Esophageal obstruction: Secondary | ICD-10-CM

## 2023-09-18 DIAGNOSIS — K2101 Gastro-esophageal reflux disease with esophagitis, with bleeding: Secondary | ICD-10-CM

## 2023-09-18 MED ORDER — PANTOPRAZOLE SODIUM 40 MG PO TBEC: 40.0000 mg | DELAYED_RELEASE_TABLET | Freq: Two times a day (BID) | ORAL | 3 refills | Status: DC

## 2023-09-22 ENCOUNTER — Encounter: Payer: Self-pay | Admitting: Gastroenterology

## 2023-09-22 DIAGNOSIS — E1122 Type 2 diabetes mellitus with diabetic chronic kidney disease: Secondary | ICD-10-CM | POA: Diagnosis not present

## 2023-09-22 DIAGNOSIS — D631 Anemia in chronic kidney disease: Secondary | ICD-10-CM | POA: Diagnosis not present

## 2023-09-22 DIAGNOSIS — I129 Hypertensive chronic kidney disease with stage 1 through stage 4 chronic kidney disease, or unspecified chronic kidney disease: Secondary | ICD-10-CM | POA: Diagnosis not present

## 2023-09-22 DIAGNOSIS — N184 Chronic kidney disease, stage 4 (severe): Secondary | ICD-10-CM | POA: Diagnosis not present

## 2023-09-22 DIAGNOSIS — N189 Chronic kidney disease, unspecified: Secondary | ICD-10-CM | POA: Diagnosis not present

## 2023-09-23 LAB — LAB REPORT - SCANNED
Creatinine, POC: 231.6 mg/dL
EGFR: 20

## 2023-10-04 ENCOUNTER — Other Ambulatory Visit: Payer: Self-pay | Admitting: Adult Health

## 2023-10-04 DIAGNOSIS — M1A00X1 Idiopathic chronic gout, unspecified site, with tophus (tophi): Secondary | ICD-10-CM

## 2023-10-04 DIAGNOSIS — F4321 Adjustment disorder with depressed mood: Secondary | ICD-10-CM

## 2023-11-05 ENCOUNTER — Other Ambulatory Visit: Payer: Self-pay | Admitting: Physician Assistant

## 2023-11-05 ENCOUNTER — Other Ambulatory Visit: Payer: Self-pay | Admitting: Adult Health

## 2023-11-05 DIAGNOSIS — K222 Esophageal obstruction: Secondary | ICD-10-CM

## 2023-11-05 DIAGNOSIS — K219 Gastro-esophageal reflux disease without esophagitis: Secondary | ICD-10-CM

## 2023-11-05 DIAGNOSIS — K2970 Gastritis, unspecified, without bleeding: Secondary | ICD-10-CM

## 2023-11-05 DIAGNOSIS — K2101 Gastro-esophageal reflux disease with esophagitis, with bleeding: Secondary | ICD-10-CM

## 2023-11-07 DIAGNOSIS — H43393 Other vitreous opacities, bilateral: Secondary | ICD-10-CM | POA: Diagnosis not present

## 2023-11-07 DIAGNOSIS — Z0100 Encounter for examination of eyes and vision without abnormal findings: Secondary | ICD-10-CM | POA: Diagnosis not present

## 2023-11-13 ENCOUNTER — Telehealth: Payer: Self-pay

## 2023-11-13 NOTE — Patient Outreach (Signed)
Attempted to contact patient regarding care gaps. Left voicemail for patient to return my call at [phone].  Nicholes Rough, CMA Care Guide [employer]

## 2023-12-19 ENCOUNTER — Other Ambulatory Visit: Payer: Self-pay | Admitting: Adult Health

## 2023-12-19 DIAGNOSIS — M26609 Unspecified temporomandibular joint disorder, unspecified side: Secondary | ICD-10-CM

## 2023-12-29 DIAGNOSIS — N184 Chronic kidney disease, stage 4 (severe): Secondary | ICD-10-CM | POA: Diagnosis not present

## 2023-12-30 ENCOUNTER — Encounter: Payer: Self-pay | Admitting: Gastroenterology

## 2023-12-30 ENCOUNTER — Ambulatory Visit: Payer: Medicare HMO | Admitting: Gastroenterology

## 2023-12-30 VITALS — BP 180/108 | HR 76 | Ht 67.5 in | Wt 234.0 lb

## 2023-12-30 DIAGNOSIS — R1310 Dysphagia, unspecified: Secondary | ICD-10-CM

## 2023-12-30 DIAGNOSIS — K2970 Gastritis, unspecified, without bleeding: Secondary | ICD-10-CM

## 2023-12-30 DIAGNOSIS — K219 Gastro-esophageal reflux disease without esophagitis: Secondary | ICD-10-CM | POA: Insufficient documentation

## 2023-12-30 DIAGNOSIS — K2100 Gastro-esophageal reflux disease with esophagitis, without bleeding: Secondary | ICD-10-CM

## 2023-12-30 DIAGNOSIS — K222 Esophageal obstruction: Secondary | ICD-10-CM

## 2023-12-30 MED ORDER — PANTOPRAZOLE SODIUM 40 MG PO TBEC: 40.0000 mg | DELAYED_RELEASE_TABLET | Freq: Two times a day (BID) | ORAL | 3 refills | Status: DC

## 2023-12-30 NOTE — Patient Instructions (Signed)
 We have sent the following medications to your pharmacy for you to pick up at your convenience: Pantoprazole  40 mg twice daily 30-60 minutes before breakfast and dinner.   _______________________________________________________  If your blood pressure at your visit was 140/90 or greater, please contact your primary care physician to follow up on this.  _______________________________________________________  If you are age 76 or older, your body mass index should be between 23-30. Your Body mass index is 36.11 kg/m. If this is out of the aforementioned range listed, please consider follow up with your Primary Care Provider.  If you are age 69 or younger, your body mass index should be between 19-25. Your Body mass index is 36.11 kg/m. If this is out of the aformentioned range listed, please consider follow up with your Primary Care Provider.   ________________________________________________________  The [facility] providers would like to encourage you to use MYCHART to communicate with providers for non-urgent requests or questions.  Due to long hold times on the telephone, sending your provider a message by [facility] may be a faster and more efficient way to get a response.  Please allow 48 business hours for a response.  Please remember that this is for non-urgent requests.  _______________________________________________________

## 2023-12-30 NOTE — Progress Notes (Signed)
 Noted.

## 2023-12-30 NOTE — Progress Notes (Signed)
 12/30/2023 Jared [name] [id number] [birth year]   HISTORY OF PRESENT ILLNESS:  This is a 78 year old male who is a patient of Dr. Nancyann with a history of GERD complicated by erosive esophagitis and peptic stricture formation. Last seen in this office 02/2020. Last screening colonoscopy June 2019 with large hyperplastic polyp and diverticulosis. Underwent upper endoscopy for recurrent dysphagia and active reflux symptoms on once daily PPI. This was performed January 31, 2020. He was found to have distal esophageal stricture with associated erosive esophagitis and antral erosions. Testing for Helicobacter pylori was negative. He was dilated to a maximal diameter of 18 mm. Placed on pantoprazole  40 mg twice daily.  He has been doing much better with that twice daily regimen.  He has actually not been seen here in almost 4 years as above.  He is out of his pantoprazole  for at least the last month.  Has been having some intermittent issues with dysphagia, depending on what he eats.  He feels that it will be better if he restarts with medication.  Otherwise feels well.  Patient's BP is high today but he has not yet taken his BP medication yet today.  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   GERD (gastroesophageal reflux disease)    Gout 07/26/2007   on daily RX  (12/15/2018)   HYPERCHOLESTEROLEMIA, BORDERLINE 01/16/2010   HYPERTENSION 07/26/2007   OSA (obstructive sleep apnea)    haven't used mask in quite awhile (12/15/2018)   OSTEOARTHRITIS 09/28/2007   PLANTAR FASCIITIS, BILATERAL 04/19/2010   PROSTATITIS, RECURRENT 11/08/2009   RENAL INSUFFICIENCY 07/26/2007   Sleep apnea    off Cpap    Stroke (HCC) 01/21/2013   fully recovered (12/15/2018)   Type II diabetes mellitus (HCC)    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 He has never used smokeless tobacco. He reports that he does not currently use alcohol. He reports that he does not use drugs. family history includes CVA in his father; Colon polyps in his father; Diabetes in his sister; Heart disease in his father and mother. Allergies  Allergen Reactions   Strawberry Extract Shortness Of Breath   Wellbutrin  [Bupropion ] Other (See Comments)     Made me feel weird       Outpatient Encounter Medications as of 12/30/2023  Medication Sig   allopurinol  (ZYLOPRIM ) 100 MG tablet TAKE 1 TABLET BY MOUTH TWICE A DAY   amLODipine  (NORVASC ) 10 MG tablet TAKE 1 TABLET BY MOUTH EVERY DAY   aspirin  EC 81 MG tablet Take 81 mg by mouth daily. Swallow whole.   blood glucose meter kit and supplies KIT Dispense based on patient and insurance preference. Use up to three times daily as directed.   Cholecalciferol (VITAMIN D -3) 125 MCG (5000 UT) TABS Take 1 tablet by mouth daily.   cyclobenzaprine  (FLEXERIL ) 10 MG tablet TAKE 1 TABLET BY MOUTH EVERYDAY AT BEDTIME   fexofenadine  (ALLEGRA  ALLERGY) 60 MG tablet Take 1 tablet (60 mg total) by mouth daily as needed (itching).   hydrALAZINE  (APRESOLINE ) 25 MG  tablet Take 1 tablet (25 mg total) by mouth 3 (three) times daily.   isosorbide  dinitrate (ISORDIL ) 20 MG tablet Take 1 tablet (20 mg total) by mouth 3 (three) times daily.   labetalol  (NORMODYNE ) 200 MG tablet TAKE 2 TABLETS BY MOUTH TWICE A DAY   losartan  (COZAAR ) 25 MG tablet TAKE 1 TABLET (25 MG TOTAL) BY MOUTH DAILY.   pantoprazole  (PROTONIX ) 40 MG tablet TAKE 1 TABLET (40 MG TOTAL) BY MOUTH 2 (TWO) TIMES DAILY. OFFICE VISIT FOR FURTHER REFILLS   tadalafil  (CIALIS ) 10 MG tablet Take 1 tablet (10 mg total) by mouth every other day as needed for erectile dysfunction.    atorvastatin  (LIPITOR) 40 MG tablet TAKE 1 TABLET BY MOUTH EVERY DAY (Patient not taking: Reported on 12/30/2023)   buPROPion  (WELLBUTRIN  XL) 150 MG 24 hr tablet TAKE 1 TABLET BY MOUTH EVERY DAY (Patient not taking: Reported on 12/30/2023)   dapagliflozin  propanediol (FARXIGA ) 5 MG TABS tablet Take 1 tablet (5 mg total) by mouth daily before breakfast. (Patient not taking: Reported on 12/30/2023)   No facility-administered encounter medications on file as of 12/30/2023.    REVIEW OF SYSTEMS  : All other systems reviewed and negative except where noted in the History of Present Illness.   PHYSICAL EXAM: Ht 5' 7.5 (1.715 m)   Wt 234 lb (106.1 kg)   BMI 36.11 kg/m  General: Well developed male in no acute distress Head: Normocephalic and atraumatic Eyes:  Sclerae anicteric,conjunctive pink. Ears: Normal auditory acuity Lungs: Clear throughout to auscultation; no W/R/R. Heart: Regular rate and rhythm; no M/R/G. Musculoskeletal: Symmetrical with no gross deformities  Skin: No lesions on visible extremities Neurological: Alert oriented x 4, grossly non-focal Psychological:  Alert and cooperative. 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  S/p EGD with dilation in 2021 on twice daily PPI.  He has been off of his medication for at least a month now.  Needs that refilled.  Pantoprazole  40 mg twice daily prescription sent to pharmacy.  Is having some dysphagia, but he feels like it may improve if he restarts his medication.  Otherwise he will follow-up in a year for further refills or sooner if needed.  He will contact us  back if dysphagia persists or worsens despite taking his medication and we can consider repeat EGD with dilation.  CC:  Merna Huxley, NP

## 2024-01-04 DIAGNOSIS — E1122 Type 2 diabetes mellitus with diabetic chronic kidney disease: Secondary | ICD-10-CM | POA: Diagnosis not present

## 2024-01-04 DIAGNOSIS — D631 Anemia in chronic kidney disease: Secondary | ICD-10-CM | POA: Diagnosis not present

## 2024-01-04 DIAGNOSIS — N184 Chronic kidney disease, stage 4 (severe): Secondary | ICD-10-CM | POA: Diagnosis not present

## 2024-01-04 DIAGNOSIS — I129 Hypertensive chronic kidney disease with stage 1 through stage 4 chronic kidney disease, or unspecified chronic kidney disease: Secondary | ICD-10-CM | POA: Diagnosis not present

## 2024-01-04 DIAGNOSIS — N2581 Secondary hyperparathyroidism of renal origin: Secondary | ICD-10-CM | POA: Diagnosis not present

## 2024-01-07 ENCOUNTER — Other Ambulatory Visit: Payer: Self-pay | Admitting: Adult Health

## 2024-01-07 DIAGNOSIS — F4321 Adjustment disorder with depressed mood: Secondary | ICD-10-CM

## 2024-03-03 ENCOUNTER — Other Ambulatory Visit: Payer: Self-pay | Admitting: Adult Health

## 2024-03-03 DIAGNOSIS — M26609 Unspecified temporomandibular joint disorder, unspecified side: Secondary | ICD-10-CM

## 2024-03-03 DIAGNOSIS — M1A00X1 Idiopathic chronic gout, unspecified site, with tophus (tophi): Secondary | ICD-10-CM

## 2024-03-20 ENCOUNTER — Other Ambulatory Visit: Payer: Self-pay | Admitting: Adult Health

## 2024-03-20 DIAGNOSIS — M26609 Unspecified temporomandibular joint disorder, unspecified side: Secondary | ICD-10-CM

## 2024-03-22 NOTE — Telephone Encounter (Signed)
Pt needs a CPE 

## 2024-04-06 ENCOUNTER — Other Ambulatory Visit: Payer: Self-pay | Admitting: Adult Health

## 2024-04-06 DIAGNOSIS — M1A00X1 Idiopathic chronic gout, unspecified site, with tophus (tophi): Secondary | ICD-10-CM

## 2024-04-06 NOTE — Telephone Encounter (Signed)
 Patient need to schedule CPE for more refills.

## 2024-04-07 ENCOUNTER — Other Ambulatory Visit: Payer: Self-pay | Admitting: Adult Health

## 2024-04-07 DIAGNOSIS — M26609 Unspecified temporomandibular joint disorder, unspecified side: Secondary | ICD-10-CM

## 2024-04-07 DIAGNOSIS — I10 Essential (primary) hypertension: Secondary | ICD-10-CM

## 2024-04-12 DIAGNOSIS — N390 Urinary tract infection, site not specified: Secondary | ICD-10-CM | POA: Diagnosis not present

## 2024-04-12 DIAGNOSIS — E559 Vitamin D deficiency, unspecified: Secondary | ICD-10-CM | POA: Diagnosis not present

## 2024-04-12 DIAGNOSIS — N184 Chronic kidney disease, stage 4 (severe): Secondary | ICD-10-CM | POA: Diagnosis not present

## 2024-04-19 ENCOUNTER — Ambulatory Visit (INDEPENDENT_AMBULATORY_CARE_PROVIDER_SITE_OTHER): Admitting: Adult Health

## 2024-04-19 ENCOUNTER — Encounter: Payer: Self-pay | Admitting: Adult Health

## 2024-04-19 VITALS — BP 160/90 | HR 51 | Temp 98.0°F | Ht 67.5 in | Wt 232.0 lb

## 2024-04-19 DIAGNOSIS — E119 Type 2 diabetes mellitus without complications: Secondary | ICD-10-CM | POA: Diagnosis not present

## 2024-04-19 DIAGNOSIS — N400 Enlarged prostate without lower urinary tract symptoms: Secondary | ICD-10-CM | POA: Diagnosis not present

## 2024-04-19 DIAGNOSIS — M1A00X1 Idiopathic chronic gout, unspecified site, with tophus (tophi): Secondary | ICD-10-CM | POA: Diagnosis not present

## 2024-04-19 DIAGNOSIS — E782 Mixed hyperlipidemia: Secondary | ICD-10-CM

## 2024-04-19 DIAGNOSIS — Z0000 Encounter for general adult medical examination without abnormal findings: Secondary | ICD-10-CM

## 2024-04-19 DIAGNOSIS — F4321 Adjustment disorder with depressed mood: Secondary | ICD-10-CM | POA: Diagnosis not present

## 2024-04-19 DIAGNOSIS — Z7984 Long term (current) use of oral hypoglycemic drugs: Secondary | ICD-10-CM

## 2024-04-19 DIAGNOSIS — N522 Drug-induced erectile dysfunction: Secondary | ICD-10-CM

## 2024-04-19 DIAGNOSIS — I10 Essential (primary) hypertension: Secondary | ICD-10-CM | POA: Diagnosis not present

## 2024-04-19 DIAGNOSIS — N184 Chronic kidney disease, stage 4 (severe): Secondary | ICD-10-CM | POA: Diagnosis not present

## 2024-04-19 MED ORDER — LOSARTAN POTASSIUM 25 MG PO TABS: 25.0000 mg | ORAL_TABLET | Freq: Every day | ORAL | 3 refills | Status: AC

## 2024-04-19 MED ORDER — ALLOPURINOL 100 MG PO TABS
100.0000 mg | ORAL_TABLET | Freq: Two times a day (BID) | ORAL | 3 refills | Status: AC
Start: 2024-04-19 — End: 2024-11-08

## 2024-04-19 MED ORDER — DAPAGLIFLOZIN PROPANEDIOL 5 MG PO TABS: 5.0000 mg | ORAL_TABLET | Freq: Every day | ORAL | 1 refills | Status: AC

## 2024-04-19 MED ORDER — TADALAFIL 10 MG PO TABS: 10.0000 mg | ORAL_TABLET | ORAL | 1 refills | Status: DC | PRN

## 2024-04-19 MED ORDER — ATORVASTATIN CALCIUM 40 MG PO TABS: 40.0000 mg | ORAL_TABLET | Freq: Every day | ORAL | 3 refills | Status: AC

## 2024-04-19 MED ORDER — HYDRALAZINE HCL 25 MG PO TABS: 25.0000 mg | ORAL_TABLET | Freq: Three times a day (TID) | ORAL | 3 refills | Status: AC

## 2024-04-19 NOTE — Progress Notes (Signed)
 Subjective:    Patient ID: Jared [name], male    DOB: [birth year], 78 y.o.   MRN: [id number]  HPI Patient presents for yearly preventative medicine examination. 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  DM Type 2 -currently managed with Farxiga  5 mg daily ( has not been taking, reports he ran out of medication and never came in to get it refilled).  In the past he was on metformin  but this was discontinued due to CKD.  He does not monitor his blood sugars at home.  Denies episodes of hypoglycemia Lab Results  Component Value Date   HGBA1C 6.2 (A) 08/19/2023   HGBA1C 6.1 (A) 03/03/2023   HGBA1C 6.6 (H) 12/02/2022   HTN -managed with lisinopril  10 mg twice daily, labetalol  200 mg twice daily, isosorbide  20 mg 3 times daily, hydralazine  25 mg 3 times daily, and Norvasc  10 mg daily.  He denies dizziness, lightheadedness, chest pain, or shortness of breath. He did not take his blood pressure medication this morning. (He has been out of Hydralazine , and losartan ). He reports that his blood pressures have been " up and down". BP Readings from Last 3 Encounters:  04/19/24 (!) 160/90  12/30/23 (!) 180/108  08/19/23 130/88   CKD stage 4 -managed by nephrology.  He was last seen in January 2025 which time his BUN was 31, creatinine 3.66 and GFR 17  Gout-managed with allopurinol  100 mg twice daily.  HLD-managed with atorvastatin  40 mg daily.  He denies myalgia or fatigue  Lab Results  Component Value Date   CHOL 120 12/02/2022   HDL 34.70 (L) 12/02/2022   LDLCALC 60 12/02/2022   LDLDIRECT 85.1 06/03/2013   TRIG 128.0 12/02/2022   CHOLHDL 3 12/02/2022   BPH - controlled without medications   Situational Depression - Takes Wellbutrin  and finds that this controls his symptoms.   ED - would like a refill of Cialis    All immunizations and health maintenance protocols were reviewed with the patient and needed orders were placed.  Appropriate screening laboratory values were ordered for the patient including screening of hyperlipidemia, renal function and hepatic function. If indicated by BPH, a PSA was ordered.  Medication reconciliation,  past medical history, social history, problem list and allergies were reviewed in detail with the patient  Goals were established with regard to weight loss, exercise, and  diet in compliance with medications  Wt Readings from Last 3 Encounters:  04/19/24 232 lb (105.2 kg)  12/30/23 234 lb (106.1 kg)  08/19/23 244 lb (110.7 kg)   He is over due for his colonoscopy   Review of Systems  Constitutional: Negative.   HENT: Negative.    Eyes: Negative.   Respiratory: Negative.    Cardiovascular: Negative.   Gastrointestinal: Negative.   Endocrine: Negative.   Genitourinary: Negative.   Musculoskeletal:  Positive for arthralgias and back pain.  Skin: Negative.   Allergic/Immunologic: Negative.   Neurological: Negative.   Hematological: Negative.   Psychiatric/Behavioral: Negative.    All other systems reviewed and are negative.  Past Medical History:  Diagnosis Date   Childhood asthma  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   GERD (gastroesophageal reflux disease)    Gout 07/26/2007   "on daily RX"  (12/15/2018)   HYPERCHOLESTEROLEMIA, BORDERLINE 01/16/2010   HYPERTENSION 07/26/2007   OSA (obstructive sleep apnea)    "haven't used mask in quite awhile" (12/15/2018)    OSTEOARTHRITIS 09/28/2007   PLANTAR FASCIITIS, BILATERAL 04/19/2010   PROSTATITIS, RECURRENT 11/08/2009   RENAL INSUFFICIENCY 07/26/2007   Sleep apnea    off Cpap    Stroke (HCC) 01/21/2013   "fully recovered" (12/15/2018)   Type II diabetes mellitus (HCC)    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   URINARY FREQUENCY, CHRONIC 03/10/2008    Social History   Socioeconomic History   Marital status: Married    Spouse name: [name]   Number of children: 5   Years of education: 12   Highest education level: Not on file  Occupational History   Occupation: TESTOR    Employer: [employer]    Comment: retired  Tobacco Use   Smoking status: Never   Smokeless tobacco: Never  Vaping Use   Vaping status: Never Used  Substance and Sexual Activity   Alcohol use: Not Currently   Drug use: Never   Sexual activity: Not Currently  Other Topics Concern   Not on file  Social History Narrative   He has a part time job for delivering car parts   Married    4 children - All in [place]      He likes to go to car shows and shoot pool.    Social Drivers of Corporate investment banker Strain: Low Risk  (05/06/2022)   Overall Financial Resource Strain (CARDIA)    Difficulty of Paying Living Expenses: Not hard at all  Food Insecurity: No Food Insecurity (05/06/2022)   Hunger Vital Sign    Worried About Running Out of Food in the Last Year: Never true    Ran Out of Food in the Last Year: Never true  Transportation Needs: No Transportation Needs (05/06/2022)   PRAPARE - Administrator, Civil Service (Medical): No    Lack of Transportation (Non-Medical): No  Physical Activity: Inactive (05/06/2022)   Exercise Vital Sign    Days of Exercise per Week: 0 days    Minutes of Exercise per Session: 0 min  Stress: No Stress Concern Present (05/06/2022)   [employer] of Occupational Health - Occupational Stress Questionnaire    Feeling of Stress : Only a little  Social Connections:  Not on file  Intimate Partner Violence: Not on file    Past Surgical History:  Procedure Laterality Date   COLONOSCOPY     CYST EXCISION Left    "leg"   TONSILLECTOMY     UPPER GASTROINTESTINAL ENDOSCOPY     WRIST SURGERY Right    "cut bone out; for gout""    Family History  Problem Relation Age of Onset   Heart disease Mother    Heart disease Father    CVA Father    Colon polyps Father    Diabetes Sister    Colon cancer Neg Hx    Esophageal cancer Neg Hx    Stomach cancer Neg Hx    Pancreatic cancer Neg Hx    Rectal cancer Neg Hx     Allergies  Allergen Reactions   Strawberry Extract Shortness Of Breath   Wellbutrin  [Bupropion ] Other (See Comments)    " Made me feel weird"     Current Outpatient Medications on  File Prior to Visit  Medication Sig Dispense Refill   amLODipine  (NORVASC ) 10 MG tablet TAKE 1 TABLET BY MOUTH EVERY DAY 90 tablet 0   aspirin  EC 81 MG tablet Take 81 mg by mouth daily. Swallow whole.     blood glucose meter kit and supplies KIT Dispense based on patient and insurance preference. Use up to three times daily as directed. 1 each 0   buPROPion  (WELLBUTRIN  XL) 150 MG 24 hr tablet TAKE 1 TABLET BY MOUTH EVERY DAY 90 tablet 0   Cholecalciferol (VITAMIN D -3) 125 MCG (5000 UT) TABS Take 1 tablet by mouth daily.     isosorbide  dinitrate (ISORDIL ) 20 MG tablet Take 1 tablet (20 mg total) by mouth 3 (three) times daily. 270 tablet 3   labetalol  (NORMODYNE ) 200 MG tablet TAKE 2 TABLETS BY MOUTH TWICE A DAY 360 tablet 2   pantoprazole  (PROTONIX ) 40 MG tablet Take 1 tablet (40 mg total) by mouth 2 (two) times daily. Office visit for further refills 180 tablet 3   No current facility-administered medications on file prior to visit.    BP (!) 160/90   Pulse (!) 51   Temp 98 F (36.7 C) (Oral)   Ht 5' 7.5" (1.715 m)   Wt 232 lb (105.2 kg)   SpO2 98%   BMI 35.80 kg/m       Objective:   Physical Exam Vitals and nursing note reviewed.   Constitutional:      General: He is not in acute distress.    Appearance: Normal appearance. He is obese. He is not ill-appearing.  HENT:     Head: Normocephalic and atraumatic.     Right Ear: Tympanic membrane, ear canal and external ear normal. There is no impacted cerumen.     Left Ear: Tympanic membrane, ear canal and external ear normal. There is no impacted cerumen.     Nose: Nose normal. No congestion or rhinorrhea.     Mouth/Throat:     Mouth: Mucous membranes are moist.     Pharynx: Oropharynx is clear.  Eyes:     Extraocular Movements: Extraocular movements intact.     Conjunctiva/sclera: Conjunctivae normal.     Pupils: Pupils are equal, round, and reactive to light.  Neck:     Vascular: No carotid bruit.  Cardiovascular:     Rate and Rhythm: Normal rate and regular rhythm.     Pulses: Normal pulses.     Heart sounds: No murmur heard.    No friction rub. No gallop.  Pulmonary:     Effort: Pulmonary effort is normal.     Breath sounds: Normal breath sounds.  Abdominal:     General: Abdomen is flat. Bowel sounds are normal. There is no distension.     Palpations: Abdomen is soft. There is no mass.     Tenderness: There is no abdominal tenderness. There is no guarding or rebound.     Hernia: No hernia is present.  Musculoskeletal:        General: Normal range of motion.     Cervical back: Normal range of motion and neck supple.  Lymphadenopathy:     Cervical: No cervical adenopathy.  Skin:    General: Skin is warm and dry.     Capillary Refill: Capillary refill takes less than 2 seconds.  Neurological:     General: No focal deficit present.     Mental Status: He is alert and oriented to person, place, and time.  Psychiatric:  Mood and Affect: Mood normal.        Behavior: Behavior normal.        Thought Content: Thought content normal.        Judgment: Judgment normal.       Assessment & Plan:  1. Routine general medical examination at a health care  facility (Primary) Today patient counseled on age appropriate routine health concerns for screening and prevention, each reviewed and up to date or declined. Immunizations reviewed and up to date or declined. Labs ordered and reviewed. Risk factors for depression reviewed and negative. Hearing function and visual acuity are intact. ADLs screened and addressed as needed. Functional ability and level of safety reviewed and appropriate. Education, counseling and referrals performed based on assessed risks today. Patient provided with a copy of personalized plan for preventive services. - Advised shingles and tdap at pharmacy  - needs to start exercising to lose weight  - Encouraged to follow up as directed  - Follow up in one year or sooner if needed  2. Diabetes mellitus treated with oral medication (HCC) - Consider adding agent - Follow up in three months  - dapagliflozin  propanediol (FARXIGA ) 5 MG TABS tablet; Take 1 tablet (5 mg total) by mouth daily before breakfast.  Dispense: 90 tablet; Refill: 1 - Lipid panel; Future - TSH; Future - CBC; Future - Comprehensive metabolic panel with GFR; Future - Hemoglobin A1c; Future - Microalbumin/Creatinine Ratio, Urine; Future - Microalbumin/Creatinine Ratio, Urine - Hemoglobin A1c - Comprehensive metabolic panel with GFR - CBC - TSH - Lipid panel  3. Essential hypertension - BP not at goal. Refills placed.  - Follow up in 3 months  - hydrALAZINE  (APRESOLINE ) 25 MG tablet; Take 1 tablet (25 mg total) by mouth 3 (three) times daily.  Dispense: 270 tablet; Refill: 3 - losartan  (COZAAR ) 25 MG tablet; Take 1 tablet (25 mg total) by mouth daily.  Dispense: 90 tablet; Refill: 3 - Lipid panel; Future - TSH; Future - CBC; Future - Comprehensive metabolic panel with GFR; Future - Comprehensive metabolic panel with GFR - CBC - TSH - Lipid panel  4. Stage 4 chronic kidney disease (HCC) - Follow up with Nephrology as directed - dapagliflozin   propanediol (FARXIGA ) 5 MG TABS tablet; Take 1 tablet (5 mg total) by mouth daily before breakfast.  Dispense: 90 tablet; Refill: 1 - hydrALAZINE  (APRESOLINE ) 25 MG tablet; Take 1 tablet (25 mg total) by mouth 3 (three) times daily.  Dispense: 270 tablet; Refill: 3 - Lipid panel; Future - TSH; Future - CBC; Future - Comprehensive metabolic panel with GFR; Future - Comprehensive metabolic panel with GFR - CBC - TSH - Lipid panel  5. CHRONIC GOUTY ARTHROPATHY WITH TOPHUS  - allopurinol  (ZYLOPRIM ) 100 MG tablet; Take 1 tablet (100 mg total) by mouth 2 (two) times daily.  Dispense: 180 tablet; Refill: 3  6. Mixed hyperlipidemia - Continue statin  - atorvastatin  (LIPITOR) 40 MG tablet; Take 1 tablet (40 mg total) by mouth daily.  Dispense: 90 tablet; Refill: 3 - Lipid panel; Future - TSH; Future - CBC; Future - Comprehensive metabolic panel with GFR; Future - Comprehensive metabolic panel with GFR - CBC - TSH - Lipid panel  7. Benign prostatic hyperplasia without lower urinary tract symptoms  - PSA; Future - PSA  8. Situational depression - Continue with Wellbutrin    9. Drug-induced erectile dysfunction  - tadalafil  (CIALIS ) 10 MG tablet; Take 1 tablet (10 mg total) by mouth every other day as needed for erectile  dysfunction.  Dispense: 10 tablet; Refill: 1   Vallerie Hentz, NP

## 2024-04-19 NOTE — Patient Instructions (Addendum)
 It was great seeing you today   We will follow up with you regarding your lab work   Please let me know if you need anything   Please call and schedule your colonoscopy  [phone]

## 2024-04-20 ENCOUNTER — Telehealth: Payer: Self-pay

## 2024-04-20 LAB — COMPREHENSIVE METABOLIC PANEL WITH GFR
ALT: 26 U/L (ref 0–53)
AST: 24 U/L (ref 0–37)
Albumin: 4.2 g/dL (ref 3.5–5.2)
Alkaline Phosphatase: 79 U/L (ref 39–117)
BUN: 34 mg/dL — ABNORMAL HIGH (ref 6–23)
CO2: 25 meq/L (ref 19–32)
Calcium: 9.1 mg/dL (ref 8.4–10.5)
Chloride: 105 meq/L (ref 96–112)
Creatinine, Ser: 4.14 mg/dL — ABNORMAL HIGH (ref 0.40–1.50)
GFR: 13.39 mL/min — CL (ref >60.00)
Glucose, Bld: 96 mg/dL (ref 70–99)
Potassium: 4.3 meq/L (ref 3.5–5.1)
Sodium: 140 meq/L (ref 135–145)
Total Bilirubin: 0.6 mg/dL (ref 0.2–1.2)
Total Protein: 7 g/dL (ref 6.0–8.3)

## 2024-04-20 LAB — CBC
HCT: 38.2 % — ABNORMAL LOW (ref 39.0–52.0)
Hemoglobin: 12.9 g/dL — ABNORMAL LOW (ref 13.0–17.0)
MCHC: 33.9 g/dL (ref 30.0–36.0)
MCV: 93.7 fl (ref 78.0–100.0)
Platelets: 203 10*3/uL (ref 150.0–400.0)
RBC: 4.07 Mil/uL — ABNORMAL LOW (ref 4.22–5.81)
RDW: 14.5 % (ref 11.5–15.5)
WBC: 5.7 10*3/uL (ref 4.0–10.5)

## 2024-04-20 LAB — HEMOGLOBIN A1C: Hgb A1c MFr Bld: 6.2 % (ref 4.6–6.5)

## 2024-04-20 LAB — LIPID PANEL
Cholesterol: 168 mg/dL (ref 0–200)
HDL: 33.8 mg/dL — ABNORMAL LOW (ref >39.00)
LDL Cholesterol: 92 mg/dL (ref 0–99)
NonHDL: 134.28
Total CHOL/HDL Ratio: 5
Triglycerides: 210 mg/dL — ABNORMAL HIGH (ref 0.0–149.0)
VLDL: 42 mg/dL — ABNORMAL HIGH (ref 0.0–40.0)

## 2024-04-20 LAB — MICROALBUMIN / CREATININE URINE RATIO
Creatinine,U: 273.8 mg/dL
Microalb Creat Ratio: 1132.2 mg/g — ABNORMAL HIGH (ref 0.0–30.0)
Microalb, Ur: 310 mg/dL — ABNORMAL HIGH (ref 0.0–1.9)

## 2024-04-20 LAB — PSA: PSA: 1.75 ng/mL (ref 0.10–4.00)

## 2024-04-20 LAB — TSH: TSH: 1.09 u[IU]/mL (ref 0.35–5.50)

## 2024-04-20 NOTE — Telephone Encounter (Signed)
 CRITICAL VALUE STICKER  CRITICAL VALUE: GFR 13.39  RECEIVER (on-site recipient of call): Saa  DATE NOTIFIED: 04/20/2024  TIME OF NOTIFICATION: 11:44 am

## 2024-04-25 DIAGNOSIS — N2581 Secondary hyperparathyroidism of renal origin: Secondary | ICD-10-CM | POA: Diagnosis not present

## 2024-04-25 DIAGNOSIS — N184 Chronic kidney disease, stage 4 (severe): Secondary | ICD-10-CM | POA: Diagnosis not present

## 2024-04-25 DIAGNOSIS — E1122 Type 2 diabetes mellitus with diabetic chronic kidney disease: Secondary | ICD-10-CM | POA: Diagnosis not present

## 2024-04-25 DIAGNOSIS — I129 Hypertensive chronic kidney disease with stage 1 through stage 4 chronic kidney disease, or unspecified chronic kidney disease: Secondary | ICD-10-CM | POA: Diagnosis not present

## 2024-04-25 DIAGNOSIS — D631 Anemia in chronic kidney disease: Secondary | ICD-10-CM | POA: Diagnosis not present

## 2024-05-05 ENCOUNTER — Other Ambulatory Visit: Payer: Self-pay | Admitting: Adult Health

## 2024-05-05 DIAGNOSIS — M26609 Unspecified temporomandibular joint disorder, unspecified side: Secondary | ICD-10-CM

## 2024-05-05 NOTE — Telephone Encounter (Signed)
 The original prescription was discontinued on 04/19/2024 by Alto Atta, NP. Renewing this prescription may not be appropriate.

## 2024-05-12 DIAGNOSIS — I129 Hypertensive chronic kidney disease with stage 1 through stage 4 chronic kidney disease, or unspecified chronic kidney disease: Secondary | ICD-10-CM | POA: Diagnosis not present

## 2024-05-12 DIAGNOSIS — N184 Chronic kidney disease, stage 4 (severe): Secondary | ICD-10-CM | POA: Diagnosis not present

## 2024-05-12 DIAGNOSIS — N2581 Secondary hyperparathyroidism of renal origin: Secondary | ICD-10-CM | POA: Diagnosis not present

## 2024-05-12 DIAGNOSIS — D631 Anemia in chronic kidney disease: Secondary | ICD-10-CM | POA: Diagnosis not present

## 2024-05-12 DIAGNOSIS — E1122 Type 2 diabetes mellitus with diabetic chronic kidney disease: Secondary | ICD-10-CM | POA: Diagnosis not present

## 2024-06-21 DIAGNOSIS — D631 Anemia in chronic kidney disease: Secondary | ICD-10-CM | POA: Diagnosis not present

## 2024-06-21 DIAGNOSIS — E1122 Type 2 diabetes mellitus with diabetic chronic kidney disease: Secondary | ICD-10-CM | POA: Diagnosis not present

## 2024-06-21 DIAGNOSIS — N2581 Secondary hyperparathyroidism of renal origin: Secondary | ICD-10-CM | POA: Diagnosis not present

## 2024-06-21 DIAGNOSIS — N184 Chronic kidney disease, stage 4 (severe): Secondary | ICD-10-CM | POA: Diagnosis not present

## 2024-06-21 DIAGNOSIS — I129 Hypertensive chronic kidney disease with stage 1 through stage 4 chronic kidney disease, or unspecified chronic kidney disease: Secondary | ICD-10-CM | POA: Diagnosis not present

## 2024-06-28 ENCOUNTER — Ambulatory Visit: Admitting: Family Medicine

## 2024-07-14 DIAGNOSIS — N184 Chronic kidney disease, stage 4 (severe): Secondary | ICD-10-CM | POA: Diagnosis not present

## 2024-07-19 ENCOUNTER — Ambulatory Visit (INDEPENDENT_AMBULATORY_CARE_PROVIDER_SITE_OTHER): Admitting: Adult Health

## 2024-07-19 ENCOUNTER — Encounter: Payer: Self-pay | Admitting: Adult Health

## 2024-07-19 ENCOUNTER — Ambulatory Visit: Admitting: Family Medicine

## 2024-07-19 VITALS — BP 160/90 | HR 66 | Temp 98.1°F | Ht 67.5 in | Wt 237.0 lb

## 2024-07-19 DIAGNOSIS — E1122 Type 2 diabetes mellitus with diabetic chronic kidney disease: Secondary | ICD-10-CM

## 2024-07-19 DIAGNOSIS — Z7984 Long term (current) use of oral hypoglycemic drugs: Secondary | ICD-10-CM | POA: Diagnosis not present

## 2024-07-19 DIAGNOSIS — I10 Essential (primary) hypertension: Secondary | ICD-10-CM | POA: Diagnosis not present

## 2024-07-19 DIAGNOSIS — N184 Chronic kidney disease, stage 4 (severe): Secondary | ICD-10-CM | POA: Diagnosis not present

## 2024-07-19 DIAGNOSIS — E119 Type 2 diabetes mellitus without complications: Secondary | ICD-10-CM

## 2024-07-19 LAB — POCT GLYCOSYLATED HEMOGLOBIN (HGB A1C): Hemoglobin A1C: 6.2 % — AB (ref 4.0–5.6)

## 2024-07-19 NOTE — Progress Notes (Signed)
 Subjective:    Patient ID: Jared [name], male    DOB: 02/22/1946, 78 y.o.   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  DM Type 2 -currently managed with Farxiga  5 mg daily.  In the past he was on metformin  but this was discontinued due to CKD.  He does not monitor his blood sugars at home.  Denies episodes of hypoglycemia Lab Results  Component Value Date   HGBA1C 6.2 04/19/2024   HGBA1C 6.2 (A) 08/19/2023   HGBA1C 6.1 (A) 03/03/2023   HTN -managed with lisinopril  10 mg twice daily, labetalol  200 mg twice daily, isosorbide  20 mg 3 times daily, hydralazine  25 mg 3 times daily, and Norvasc  10 mg daily.  He denies dizziness, lightheadedness, chest pain, or shortness of breath. He believes he took his medication this morning but is unsure if he has all of his medications. He takes his medications irregularly  BP Readings from Last 3 Encounters:  07/19/24 (!) 160/90  04/19/24 (!) 160/90  12/30/23 (!) 180/108    CKD stage 4 -managed by nephrology.  He was last seen in July 2025 which time his BUN was 29, creatinine 3.72 and GFR 16  Wt Readings from Last 3 Encounters:  07/19/24 237 lb (107.5 kg)  04/19/24 232 lb (105.2 kg)  12/30/23 234 lb (106.1 kg)     Review of Systems See HPI   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   GERD (gastroesophageal reflux  disease)    Gout 07/26/2007   on daily RX  (12/15/2018)   HYPERCHOLESTEROLEMIA, BORDERLINE 01/16/2010   HYPERTENSION 07/26/2007   OSA (obstructive sleep apnea)    haven't used mask in quite awhile (12/15/2018)   OSTEOARTHRITIS 09/28/2007   PLANTAR FASCIITIS, BILATERAL 04/19/2010   PROSTATITIS, RECURRENT 11/08/2009   RENAL INSUFFICIENCY 07/26/2007   Sleep apnea    off Cpap    Stroke (HCC) 01/21/2013   fully recovered (12/15/2018)   Type II diabetes mellitus (HCC)    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   URINARY FREQUENCY, CHRONIC 03/10/2008    Social History   Socioeconomic History   Marital status: Married    Spouse name: [name]   Number of children: 5   Years of education: 12   Highest education level: Not on file  Occupational History   Occupation: TESTOR    Employer: [employer]    Comment: retired  Tobacco Use   Smoking status: Never   Smokeless tobacco: Never  Vaping Use   Vaping status: Never Used  Substance and Sexual Activity   Alcohol use: Not Currently   Drug use: Never   Sexual activity: Not Currently  Other Topics Concern   Not on file  Social History Narrative   He has a part time job for delivering car parts   Married    4 children - All in [place]      He likes to go to car shows and shoot pool.    Social Drivers of Health   Financial Resource Strain: Low Risk  (05/06/2022)  Overall Financial Resource Strain (CARDIA)    Difficulty of Paying Living Expenses: Not hard at all  Food Insecurity: No Food Insecurity (05/06/2022)   Hunger Vital Sign    Worried About Running Out of Food in the Last Year: Never true    Ran Out of Food in the Last Year: Never true  Transportation Needs: No Transportation Needs (05/06/2022)   PRAPARE - Administrator, Civil Service (Medical): No    Lack of Transportation (Non-Medical): No  Physical Activity: Inactive (05/06/2022)   Exercise Vital Sign    Days of Exercise per Week: 0 days    Minutes of  Exercise per Session: 0 min  Stress: No Stress Concern Present (05/06/2022)   [employer] of Occupational Health - Occupational Stress Questionnaire    Feeling of Stress : Only a little  Social Connections: Not on file  Intimate Partner Violence: Not on file    Past Surgical History:  Procedure Laterality Date   COLONOSCOPY     CYST EXCISION Left    leg   TONSILLECTOMY     UPPER GASTROINTESTINAL ENDOSCOPY     WRIST SURGERY Right    cut bone out; for gout    Family History  Problem Relation Age of Onset   Heart disease Mother    Heart disease Father    CVA Father    Colon polyps Father    Diabetes Sister    Colon cancer Neg Hx    Esophageal cancer Neg Hx    Stomach cancer Neg Hx    Pancreatic cancer Neg Hx    Rectal cancer Neg Hx     Allergies  Allergen Reactions   Strawberry Extract Shortness Of Breath   Wellbutrin  [Bupropion ] Other (See Comments)     Made me feel weird     Current Outpatient Medications on File Prior to Visit  Medication Sig Dispense Refill   allopurinol  (ZYLOPRIM ) 100 MG tablet Take 1 tablet (100 mg total) by mouth 2 (two) times daily. 180 tablet 3   amLODipine  (NORVASC ) 10 MG tablet TAKE 1 TABLET BY MOUTH EVERY DAY 90 tablet 0   aspirin  EC 81 MG tablet Take 81 mg by mouth daily. Swallow whole.     atorvastatin  (LIPITOR) 40 MG tablet Take 1 tablet (40 mg total) by mouth daily. 90 tablet 3   blood glucose meter kit and supplies KIT Dispense based on patient and insurance preference. Use up to three times daily as directed. 1 each 0   buPROPion  (WELLBUTRIN  XL) 150 MG 24 hr tablet TAKE 1 TABLET BY MOUTH EVERY DAY 90 tablet 0   Cholecalciferol (VITAMIN D -3) 125 MCG (5000 UT) TABS Take 1 tablet by mouth daily.     isosorbide  dinitrate (ISORDIL ) 20 MG tablet Take 1 tablet (20 mg total) by mouth 3 (three) times daily. 270 tablet 3   labetalol  (NORMODYNE ) 200 MG tablet TAKE 2 TABLETS BY MOUTH TWICE A DAY 360 tablet 2   losartan  (COZAAR ) 25 MG  tablet Take 1 tablet (25 mg total) by mouth daily. 90 tablet 3   tadalafil  (CIALIS ) 10 MG tablet Take 1 tablet (10 mg total) by mouth every other day as needed for erectile dysfunction. 10 tablet 1   dapagliflozin  propanediol (FARXIGA ) 5 MG TABS tablet Take 1 tablet (5 mg total) by mouth daily before breakfast. 90 tablet 1   hydrALAZINE  (APRESOLINE ) 25 MG tablet Take 1 tablet (25 mg total) by mouth 3 (three) times daily. 270 tablet 3  pantoprazole  (PROTONIX ) 40 MG tablet Take 1 tablet (40 mg total) by mouth 2 (two) times daily. Office visit for further refills (Patient not taking: Reported on 07/19/2024) 180 tablet 3   No current facility-administered medications on file prior to visit.    BP (!) 160/90   Pulse 66   Temp 98.1 F (36.7 C) (Oral)   Ht 5' 7.5 (1.715 m)   Wt 237 lb (107.5 kg)   SpO2 99%   BMI 36.57 kg/m       Objective:   Physical Exam Vitals and nursing note reviewed.  Constitutional:      Appearance: Normal appearance. He is obese.  Cardiovascular:     Rate and Rhythm: Normal rate and regular rhythm.     Pulses: Normal pulses.     Heart sounds: Normal heart sounds.  Pulmonary:     Effort: Pulmonary effort is normal.     Breath sounds: Normal breath sounds.  Skin:    General: Skin is warm and dry.  Neurological:     General: No focal deficit present.     Mental Status: He is alert and oriented to person, place, and time.  Psychiatric:        Mood and Affect: Mood normal.        Behavior: Behavior normal.        Thought Content: Thought content normal.        Judgment: Judgment normal.       Assessment & Plan:  1. Diabetes mellitus treated with oral medication (HCC) (Primary)  - POC HgB A1c- 6.2  - At goal. Continue with Farxiga   - Follow up in 3 months   2. Essential hypertension - Not at goal. Med list printed off and he will check and see what he has at home. Needs to take medication daily.   3. Stage 4 chronic kidney disease (HCC) -  Per  nephrology   Darleene Shape, NP

## 2024-08-11 DIAGNOSIS — N184 Chronic kidney disease, stage 4 (severe): Secondary | ICD-10-CM | POA: Diagnosis not present

## 2024-08-15 ENCOUNTER — Ambulatory Visit: Admitting: Internal Medicine

## 2024-08-18 ENCOUNTER — Encounter (INDEPENDENT_AMBULATORY_CARE_PROVIDER_SITE_OTHER): Admitting: Family Medicine

## 2024-08-18 NOTE — Progress Notes (Signed)
 error

## 2024-08-25 ENCOUNTER — Encounter: Admitting: Family Medicine

## 2024-08-25 NOTE — Progress Notes (Signed)
 error

## 2024-08-25 NOTE — Patient Instructions (Signed)
 error

## 2024-09-08 ENCOUNTER — Other Ambulatory Visit: Payer: Self-pay | Admitting: Adult Health

## 2024-09-08 DIAGNOSIS — I10 Essential (primary) hypertension: Secondary | ICD-10-CM

## 2024-09-08 DIAGNOSIS — N1832 Chronic kidney disease, stage 3b: Secondary | ICD-10-CM

## 2024-09-08 DIAGNOSIS — M1A09X0 Idiopathic chronic gout, multiple sites, without tophus (tophi): Secondary | ICD-10-CM

## 2024-09-08 DIAGNOSIS — E1121 Type 2 diabetes mellitus with diabetic nephropathy: Secondary | ICD-10-CM

## 2024-09-08 DIAGNOSIS — E782 Mixed hyperlipidemia: Secondary | ICD-10-CM

## 2024-09-08 DIAGNOSIS — Z0000 Encounter for general adult medical examination without abnormal findings: Secondary | ICD-10-CM

## 2024-10-20 ENCOUNTER — Ambulatory Visit: Admitting: Adult Health

## 2024-11-04 ENCOUNTER — Ambulatory Visit: Admitting: Internal Medicine

## 2024-11-08 ENCOUNTER — Encounter: Payer: Self-pay | Admitting: Internal Medicine

## 2024-11-08 ENCOUNTER — Ambulatory Visit: Admitting: Internal Medicine

## 2024-11-08 VITALS — BP 180/100 | HR 46 | Ht 67.5 in | Wt 233.4 lb

## 2024-11-08 DIAGNOSIS — K2970 Gastritis, unspecified, without bleeding: Secondary | ICD-10-CM

## 2024-11-08 DIAGNOSIS — K222 Esophageal obstruction: Secondary | ICD-10-CM

## 2024-11-08 DIAGNOSIS — K2990 Gastroduodenitis, unspecified, without bleeding: Secondary | ICD-10-CM | POA: Diagnosis not present

## 2024-11-08 DIAGNOSIS — K219 Gastro-esophageal reflux disease without esophagitis: Secondary | ICD-10-CM

## 2024-11-08 MED ORDER — PANTOPRAZOLE SODIUM 40 MG PO TBEC
40.0000 mg | DELAYED_RELEASE_TABLET | Freq: Two times a day (BID) | ORAL | 3 refills | Status: AC
Start: 2024-11-08 — End: ?

## 2024-11-08 NOTE — Patient Instructions (Signed)
 We have sent the following medications to your pharmacy for you to pick up at your convenience:  Pantoprazole .  Please follow in one year.  _______________________________________________________  If your blood pressure at your visit was 140/90 or greater, please contact your primary care physician to follow up on this.  _______________________________________________________  If you are age 76 or older, your body mass index should be between 23-30. Your Body mass index is 36.02 kg/m. If this is out of the aforementioned range listed, please consider follow up with your Primary Care Provider.  If you are age 4 or younger, your body mass index should be between 19-25. Your Body mass index is 36.02 kg/m. If this is out of the aformentioned range listed, please consider follow up with your Primary Care Provider.   ________________________________________________________  The [facility] providers would like to encourage you to use MYCHART to communicate with providers for non-urgent requests or questions.  Due to long hold times on the telephone, sending your provider a message by [facility] may be a faster and more efficient way to get a response.  Please allow 48 business hours for a response.  Please remember that this is for non-urgent requests.  _______________________________________________________  [facility] is using a team-based approach to care.  Your team is made up of your doctor and two to three APPS. Our APPS (Nurse Practitioners and Physician Assistants) work with your physician to ensure care continuity for you. They are fully qualified to address your health concerns and develop a treatment plan. They communicate directly with your gastroenterologist to care for you. Seeing the Advanced Practice Practitioners on your physician's team can help you by facilitating care more promptly, often allowing for earlier appointments, access to diagnostic testing, procedures, and  other specialty referrals.

## 2024-11-08 NOTE — Progress Notes (Signed)
 HISTORY OF PRESENT ILLNESS:  Jared [name] is a 77 y.o. male, employee of [employer], with multiple medical problems as listed below.  He has been followed in this office for GERD complicated by erosive esophagitis and symptomatic peptic stricture requiring esophageal dilation.  He presents today for follow-up.  He was last seen by the GI physician assistant January 2025.  At that time he came off his PPI and was having significant reflux symptoms as well as recurrent dysphagia.  He was placed back on pantoprazole  40 mg twice daily and follows up at this time.  Patient reports that his reflux symptoms are well-controlled.  He is compliant with medication twice daily.  His esophageal dysphagia has resolved.  No new complaints.  He last underwent routine screening colonoscopy June 2019.  He was found to have a hyperplastic polyp and diverticulosis as well as hemorrhoids.  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  REVIEW OF SYSTEMS:  All non-GI ROS negative.  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   GERD (gastroesophageal reflux disease)    Gout 07/26/2007   on daily RX  (12/15/2018)   HYPERCHOLESTEROLEMIA, BORDERLINE 01/16/2010   HYPERTENSION 07/26/2007   OSA (obstructive sleep apnea)    haven't used mask in quite awhile (12/15/2018)   OSTEOARTHRITIS 09/28/2007   PLANTAR FASCIITIS, BILATERAL 04/19/2010   PROSTATITIS, RECURRENT 11/08/2009   RENAL INSUFFICIENCY 07/26/2007   Sleep apnea    off Cpap    Stroke (HCC) 01/21/2013   fully recovered (12/15/2018)   Type II diabetes mellitus (HCC)    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 He has never used smokeless tobacco. He reports that he does not currently use alcohol. He reports that he does not use drugs.  family history includes CVA in his father; Colon polyps in his father; Diabetes in his sister; Heart disease in his father and mother.  Allergies  Allergen Reactions   Strawberry Extract Shortness Of Breath   Wellbutrin  [Bupropion ] Other (See Comments)     Made me feel weird        PHYSICAL EXAMINATION: Vital signs: BP (!) 180/100   Pulse (!) 46   Ht 5' 7.5 (1.715 m)   Wt 233 lb 6.4 oz (105.9 kg)   BMI 36.02 kg/m   Constitutional: generally well-appearing, no acute distress Psychiatric: alert and oriented x3, cooperative Eyes: extraocular movements intact, anicteric, conjunctiva pink Mouth: oral pharynx moist, no lesions Neck: supple no lymphadenopathy Cardiovascular: heart regular rate and rhythm, no murmur Lungs: clear to auscultation bilaterally Abdomen: soft, nontender, nondistended, no obvious ascites, no peritoneal signs, normal bowel sounds, no organomegaly Rectal: Omitted Extremities: no clubbing, cyanosis, or lower extremity edema bilaterally Skin: no lesions on visible extremities Neuro: No focal deficits.  Nerves intact  ASSESSMENT:  1.  GERD complicated by erosive esophagitis and peptic stricture requiring esophageal dilation.  Currently asymptomatic on pantoprazole  40 mg twice daily 2.  Screening colonoscopy.  Negative for neoplasia 2019.  Aged out of surveillance 3.  Multiple general medical problems.  Stable   PLAN:  1.  Reflux precautions 2.  Continue pantoprazole  40 mg twice daily.  Prescription refilled.  Medication risk reviewed 3.  Routine office follow-up 1 year.  Contact the office in the interim for any questions or problems such as recurrent dysphagia 4.  Ongoing general medical care with his PCP Total time of 30 minutes was spent preparing to see  the patient, obtaining comprehensive history, performing medically appropriate physical exam, counseling and educating the patient regarding the above listed issues, ordering medication, defining follow-up intervals, and documenting clinical information in the health record
